# Patient Record
Sex: Female | Born: 1935 | Race: White | Hispanic: No | Smoking: Former smoker
Health system: Southern US, Community
[De-identification: ages and names within clinical notes are randomized; demographics above are authoritative.]

## PROBLEM LIST (undated history)

## (undated) DIAGNOSIS — R079 Chest pain, unspecified: Secondary | ICD-10-CM

## (undated) DIAGNOSIS — I739 Peripheral vascular disease, unspecified: Secondary | ICD-10-CM

## (undated) DIAGNOSIS — I209 Angina pectoris, unspecified: Secondary | ICD-10-CM

## (undated) DIAGNOSIS — E7800 Pure hypercholesterolemia, unspecified: Secondary | ICD-10-CM

## (undated) DIAGNOSIS — E039 Hypothyroidism, unspecified: Secondary | ICD-10-CM

## (undated) DIAGNOSIS — F32A Depression, unspecified: Secondary | ICD-10-CM

## (undated) DIAGNOSIS — F419 Anxiety disorder, unspecified: Secondary | ICD-10-CM

## (undated) DIAGNOSIS — I4891 Unspecified atrial fibrillation: Secondary | ICD-10-CM

## (undated) DIAGNOSIS — I639 Cerebral infarction, unspecified: Secondary | ICD-10-CM

## (undated) DIAGNOSIS — I509 Heart failure, unspecified: Secondary | ICD-10-CM

## (undated) DIAGNOSIS — R413 Other amnesia: Secondary | ICD-10-CM

## (undated) DIAGNOSIS — E119 Type 2 diabetes mellitus without complications: Secondary | ICD-10-CM

## (undated) DIAGNOSIS — E079 Disorder of thyroid, unspecified: Secondary | ICD-10-CM

## (undated) DIAGNOSIS — T8859XA Other complications of anesthesia, initial encounter: Secondary | ICD-10-CM

## (undated) DIAGNOSIS — F329 Major depressive disorder, single episode, unspecified: Secondary | ICD-10-CM

## (undated) DIAGNOSIS — T4145XA Adverse effect of unspecified anesthetic, initial encounter: Secondary | ICD-10-CM

## (undated) DIAGNOSIS — I10 Essential (primary) hypertension: Secondary | ICD-10-CM

## (undated) DIAGNOSIS — R0600 Dyspnea, unspecified: Secondary | ICD-10-CM

## (undated) DIAGNOSIS — I2510 Atherosclerotic heart disease of native coronary artery without angina pectoris: Secondary | ICD-10-CM

## (undated) DIAGNOSIS — I219 Acute myocardial infarction, unspecified: Secondary | ICD-10-CM

## (undated) DIAGNOSIS — F0150 Vascular dementia without behavioral disturbance: Secondary | ICD-10-CM

## (undated) DIAGNOSIS — K219 Gastro-esophageal reflux disease without esophagitis: Secondary | ICD-10-CM

## (undated) DIAGNOSIS — J42 Unspecified chronic bronchitis: Secondary | ICD-10-CM

## (undated) DIAGNOSIS — I672 Cerebral atherosclerosis: Secondary | ICD-10-CM

## (undated) DIAGNOSIS — M1990 Unspecified osteoarthritis, unspecified site: Secondary | ICD-10-CM

## (undated) HISTORY — DX: Chest pain, unspecified: R07.9

## (undated) HISTORY — PX: ABDOMINAL HYSTERECTOMY: SHX81

## (undated) HISTORY — DX: Heart failure, unspecified: I50.9

## (undated) HISTORY — DX: Unspecified atrial fibrillation: I48.91

## (undated) HISTORY — PX: CORONARY ANGIOPLASTY: SHX604

## (undated) HISTORY — DX: Atherosclerotic heart disease of native coronary artery without angina pectoris: I25.10

## (undated) HISTORY — PX: JOINT REPLACEMENT: SHX530

## (undated) HISTORY — PX: CATARACT EXTRACTION W/ INTRAOCULAR LENS  IMPLANT, BILATERAL: SHX1307

## (undated) HISTORY — DX: Peripheral vascular disease, unspecified: I73.9

## (undated) HISTORY — DX: Acute myocardial infarction, unspecified: I21.9

## (undated) HISTORY — PX: EYE SURGERY: SHX253

## (undated) HISTORY — DX: Dyspnea, unspecified: R06.00

## (undated) HISTORY — DX: Cerebral atherosclerosis: I67.2

---

## 1991-05-15 HISTORY — PX: CORONARY ARTERY BYPASS GRAFT: SHX141

## 1998-04-06 ENCOUNTER — Ambulatory Visit (HOSPITAL_COMMUNITY): Admission: RE | Admit: 1998-04-06 | Discharge: 1998-04-06 | Payer: Self-pay | Admitting: Cardiology

## 1998-12-01 ENCOUNTER — Encounter: Payer: Self-pay | Admitting: Vascular Surgery

## 1998-12-02 ENCOUNTER — Inpatient Hospital Stay: Admission: RE | Admit: 1998-12-02 | Discharge: 1998-12-06 | Payer: Self-pay | Admitting: Vascular Surgery

## 1998-12-04 ENCOUNTER — Encounter: Payer: Self-pay | Admitting: *Deleted

## 1999-08-22 ENCOUNTER — Other Ambulatory Visit: Admission: RE | Admit: 1999-08-22 | Discharge: 1999-08-22 | Payer: Self-pay | Admitting: Endocrinology

## 2000-08-28 ENCOUNTER — Other Ambulatory Visit: Admission: RE | Admit: 2000-08-28 | Discharge: 2000-08-28 | Payer: Self-pay | Admitting: Endocrinology

## 2000-12-19 ENCOUNTER — Ambulatory Visit (HOSPITAL_COMMUNITY): Admission: RE | Admit: 2000-12-19 | Discharge: 2000-12-19 | Payer: Self-pay | Admitting: Vascular Surgery

## 2000-12-25 ENCOUNTER — Inpatient Hospital Stay (HOSPITAL_COMMUNITY): Admission: RE | Admit: 2000-12-25 | Discharge: 2000-12-29 | Payer: Self-pay | Admitting: Vascular Surgery

## 2000-12-25 ENCOUNTER — Encounter: Payer: Self-pay | Admitting: Vascular Surgery

## 2002-02-20 ENCOUNTER — Encounter: Payer: Self-pay | Admitting: Emergency Medicine

## 2002-02-20 ENCOUNTER — Inpatient Hospital Stay (HOSPITAL_COMMUNITY): Admission: EM | Admit: 2002-02-20 | Discharge: 2002-02-23 | Payer: Self-pay | Admitting: Emergency Medicine

## 2002-02-21 ENCOUNTER — Encounter: Payer: Self-pay | Admitting: Cardiology

## 2003-10-20 ENCOUNTER — Other Ambulatory Visit: Admission: RE | Admit: 2003-10-20 | Discharge: 2003-10-20 | Payer: Self-pay | Admitting: Endocrinology

## 2004-02-02 ENCOUNTER — Ambulatory Visit (HOSPITAL_COMMUNITY): Admission: RE | Admit: 2004-02-02 | Discharge: 2004-02-02 | Payer: Self-pay | Admitting: Endocrinology

## 2006-05-16 ENCOUNTER — Ambulatory Visit (HOSPITAL_COMMUNITY): Admission: RE | Admit: 2006-05-16 | Discharge: 2006-05-16 | Payer: Self-pay | Admitting: Vascular Surgery

## 2007-02-21 ENCOUNTER — Encounter: Admission: RE | Admit: 2007-02-21 | Discharge: 2007-02-21 | Payer: Self-pay | Admitting: Endocrinology

## 2008-03-01 DIAGNOSIS — I2510 Atherosclerotic heart disease of native coronary artery without angina pectoris: Secondary | ICD-10-CM

## 2008-03-01 HISTORY — DX: Atherosclerotic heart disease of native coronary artery without angina pectoris: I25.10

## 2008-03-09 ENCOUNTER — Ambulatory Visit: Payer: Self-pay | Admitting: Vascular Surgery

## 2008-05-14 HISTORY — PX: TOTAL KNEE ARTHROPLASTY: SHX125

## 2009-05-14 HISTORY — PX: AORTA - BILATERAL FEMORAL ARTERY BYPASS GRAFT: SHX1175

## 2009-10-05 ENCOUNTER — Inpatient Hospital Stay (HOSPITAL_COMMUNITY): Admission: EM | Admit: 2009-10-05 | Discharge: 2009-10-07 | Payer: Self-pay | Admitting: Emergency Medicine

## 2009-10-06 HISTORY — PX: CARDIAC CATHETERIZATION: SHX172

## 2009-10-07 ENCOUNTER — Encounter (INDEPENDENT_AMBULATORY_CARE_PROVIDER_SITE_OTHER): Payer: Self-pay | Admitting: Cardiology

## 2009-10-07 DIAGNOSIS — R079 Chest pain, unspecified: Secondary | ICD-10-CM

## 2009-10-07 HISTORY — DX: Chest pain, unspecified: R07.9

## 2009-12-27 ENCOUNTER — Ambulatory Visit: Payer: Self-pay | Admitting: Vascular Surgery

## 2010-01-11 ENCOUNTER — Ambulatory Visit: Payer: Self-pay | Admitting: Surgery

## 2010-01-11 ENCOUNTER — Ambulatory Visit (HOSPITAL_COMMUNITY): Admission: RE | Admit: 2010-01-11 | Discharge: 2010-01-11 | Payer: Self-pay | Admitting: Surgery

## 2010-02-07 ENCOUNTER — Ambulatory Visit: Payer: Self-pay | Admitting: Vascular Surgery

## 2010-02-23 ENCOUNTER — Inpatient Hospital Stay (HOSPITAL_COMMUNITY): Admission: RE | Admit: 2010-02-23 | Discharge: 2010-03-07 | Payer: Self-pay | Admitting: Vascular Surgery

## 2010-02-23 ENCOUNTER — Ambulatory Visit: Payer: Self-pay | Admitting: Vascular Surgery

## 2010-02-23 HISTORY — PX: ANGIOPLASTY: SHX39

## 2010-02-27 ENCOUNTER — Encounter: Payer: Self-pay | Admitting: Vascular Surgery

## 2010-03-14 ENCOUNTER — Ambulatory Visit: Payer: Self-pay | Admitting: Vascular Surgery

## 2010-05-14 HISTORY — PX: CARDIAC CATHETERIZATION: SHX172

## 2010-06-23 ENCOUNTER — Other Ambulatory Visit: Payer: Self-pay

## 2010-07-03 ENCOUNTER — Other Ambulatory Visit: Payer: Self-pay

## 2010-07-26 LAB — GLUCOSE, CAPILLARY
Glucose-Capillary: 102 mg/dL — ABNORMAL HIGH (ref 70–99)
Glucose-Capillary: 104 mg/dL — ABNORMAL HIGH (ref 70–99)
Glucose-Capillary: 106 mg/dL — ABNORMAL HIGH (ref 70–99)
Glucose-Capillary: 107 mg/dL — ABNORMAL HIGH (ref 70–99)
Glucose-Capillary: 107 mg/dL — ABNORMAL HIGH (ref 70–99)
Glucose-Capillary: 122 mg/dL — ABNORMAL HIGH (ref 70–99)
Glucose-Capillary: 123 mg/dL — ABNORMAL HIGH (ref 70–99)
Glucose-Capillary: 127 mg/dL — ABNORMAL HIGH (ref 70–99)
Glucose-Capillary: 129 mg/dL — ABNORMAL HIGH (ref 70–99)
Glucose-Capillary: 139 mg/dL — ABNORMAL HIGH (ref 70–99)
Glucose-Capillary: 152 mg/dL — ABNORMAL HIGH (ref 70–99)
Glucose-Capillary: 158 mg/dL — ABNORMAL HIGH (ref 70–99)
Glucose-Capillary: 77 mg/dL (ref 70–99)
Glucose-Capillary: 81 mg/dL (ref 70–99)
Glucose-Capillary: 85 mg/dL (ref 70–99)
Glucose-Capillary: 85 mg/dL (ref 70–99)
Glucose-Capillary: 89 mg/dL (ref 70–99)
Glucose-Capillary: 94 mg/dL (ref 70–99)
Glucose-Capillary: 96 mg/dL (ref 70–99)
Glucose-Capillary: 114 mg/dL — ABNORMAL HIGH (ref 70–99)
Glucose-Capillary: 129 mg/dL — ABNORMAL HIGH (ref 70–99)
Glucose-Capillary: 131 mg/dL — ABNORMAL HIGH (ref 70–99)
Glucose-Capillary: 134 mg/dL — ABNORMAL HIGH (ref 70–99)
Glucose-Capillary: 136 mg/dL — ABNORMAL HIGH (ref 70–99)
Glucose-Capillary: 87 mg/dL (ref 70–99)

## 2010-07-26 LAB — CBC
HCT: 28.6 % — ABNORMAL LOW (ref 36.0–46.0)
HCT: 30.2 % — ABNORMAL LOW (ref 36.0–46.0)
Hemoglobin: 10.4 g/dL — ABNORMAL LOW (ref 12.0–15.0)
Hemoglobin: 8.6 g/dL — ABNORMAL LOW (ref 12.0–15.0)
Hemoglobin: 9.5 g/dL — ABNORMAL LOW (ref 12.0–15.0)
Hemoglobin: 10 g/dL — ABNORMAL LOW (ref 12.0–15.0)
MCH: 28.6 pg (ref 26.0–34.0)
MCH: 29.1 pg (ref 26.0–34.0)
MCH: 29.1 pg (ref 26.0–34.0)
MCH: 28.8 pg (ref 26.0–34.0)
MCHC: 33.1 g/dL (ref 30.0–36.0)
MCHC: 33.2 g/dL (ref 30.0–36.0)
MCHC: 34.8 g/dL (ref 30.0–36.0)
MCV: 86 fL (ref 78.0–100.0)
MCV: 87.5 fL (ref 78.0–100.0)
MCV: 89.4 fL (ref 78.0–100.0)
MCV: 82.8 fL (ref 78.0–100.0)
Platelets: 232 10*3/uL (ref 150–400)
Platelets: 270 10*3/uL (ref 150–400)
Platelets: 203 10*3/uL (ref 150–400)
RBC: 3.01 MIL/uL — ABNORMAL LOW (ref 3.87–5.11)
RBC: 3.27 MIL/uL — ABNORMAL LOW (ref 3.87–5.11)
RBC: 3.47 MIL/uL — ABNORMAL LOW (ref 3.87–5.11)
RBC: 3.67 MIL/uL — ABNORMAL LOW (ref 3.87–5.11)
RBC: 3.46 MIL/uL — ABNORMAL LOW (ref 3.87–5.11)
RBC: 4.58 MIL/uL (ref 3.87–5.11)
RDW: 13.6 % (ref 11.5–15.5)
RDW: 14.2 % (ref 11.5–15.5)
RDW: 13 % (ref 11.5–15.5)
WBC: 16.5 10*3/uL — ABNORMAL HIGH (ref 4.0–10.5)
WBC: 11.5 10*3/uL — ABNORMAL HIGH (ref 4.0–10.5)

## 2010-07-26 LAB — BASIC METABOLIC PANEL
BUN: 14 mg/dL (ref 6–23)
BUN: 5 mg/dL — ABNORMAL LOW (ref 6–23)
CO2: 24 mEq/L (ref 19–32)
CO2: 26 mEq/L (ref 19–32)
Chloride: 108 mEq/L (ref 96–112)
Chloride: 94 mEq/L — ABNORMAL LOW (ref 96–112)
Creatinine, Ser: 0.7 mg/dL (ref 0.4–1.2)
GFR calc Af Amer: 50 mL/min — ABNORMAL LOW (ref >60)
GFR calc Af Amer: >60 mL/min (ref >60)
GFR calc Af Amer: >60 mL/min (ref >60)
GFR calc non Af Amer: >60 mL/min (ref >60)
Glucose, Bld: 64 mg/dL — ABNORMAL LOW (ref 70–99)
Potassium: 4 mEq/L (ref 3.5–5.1)
Sodium: 127 mEq/L — ABNORMAL LOW (ref 135–145)
Sodium: 135 mEq/L (ref 135–145)

## 2010-07-26 LAB — BLOOD GAS, ARTERIAL
Drawn by: 181601
FIO2: 0.21 %
O2 Saturation: 98.9 %
Patient temperature: 98.6
pO2, Arterial: 117 mmHg — ABNORMAL HIGH (ref 80.0–100.0)

## 2010-07-26 LAB — URINALYSIS, ROUTINE W REFLEX MICROSCOPIC
Glucose, UA: NEGATIVE mg/dL
Nitrite: NEGATIVE
Protein, ur: NEGATIVE mg/dL

## 2010-07-26 LAB — MAGNESIUM: Magnesium: 2.1 mg/dL (ref 1.5–2.5)

## 2010-07-26 LAB — COMPREHENSIVE METABOLIC PANEL
ALT: 17 U/L (ref 0–35)
AST: 21 U/L (ref 0–37)
BUN: 15 mg/dL (ref 6–23)
CO2: 25 mEq/L (ref 19–32)
CO2: 21 mEq/L (ref 19–32)
Calcium: 8.6 mg/dL (ref 8.4–10.5)
Chloride: 102 mEq/L (ref 96–112)
Creatinine, Ser: 0.86 mg/dL (ref 0.4–1.2)
Creatinine, Ser: 0.65 mg/dL (ref 0.4–1.2)
GFR calc Af Amer: >60 mL/min (ref >60)
GFR calc non Af Amer: >60 mL/min (ref >60)
GFR calc non Af Amer: >60 mL/min (ref >60)
Glucose, Bld: 83 mg/dL (ref 70–99)
Glucose, Bld: 131 mg/dL — ABNORMAL HIGH (ref 70–99)
Total Bilirubin: 0.7 mg/dL (ref 0.3–1.2)

## 2010-07-26 LAB — PROTIME-INR
INR: 1.13 (ref 0.00–1.49)
INR: 1.86 — ABNORMAL HIGH (ref 0.00–1.49)
Prothrombin Time: 14.7 seconds (ref 11.6–15.2)
Prothrombin Time: 14.7 seconds (ref 11.6–15.2)
Prothrombin Time: 15 seconds (ref 11.6–15.2)
Prothrombin Time: 21.6 seconds — ABNORMAL HIGH (ref 11.6–15.2)
Prothrombin Time: 13 seconds (ref 11.6–15.2)

## 2010-07-26 LAB — SURGICAL PCR SCREEN
MRSA, PCR: NEGATIVE
Staphylococcus aureus: NEGATIVE

## 2010-07-26 LAB — APTT
aPTT: 36 seconds (ref 24–37)
aPTT: 30 seconds (ref 24–37)

## 2010-07-26 LAB — CROSSMATCH
ABO/RH(D): O NEG
Unit division: 0

## 2010-07-26 LAB — CK TOTAL AND CKMB (NOT AT ARMC)
CK, MB: 2 ng/mL (ref 0.3–4.0)
Relative Index: INVALID (ref 0.0–2.5)

## 2010-07-26 LAB — TROPONIN I: Troponin I: 0.01 ng/mL (ref 0.00–0.06)

## 2010-07-26 LAB — ABO/RH: ABO/RH(D): O NEG

## 2010-07-26 LAB — HEPARIN LEVEL (UNFRACTIONATED)
Heparin Unfractionated: 0.19 IU/mL — ABNORMAL LOW (ref 0.30–0.70)
Heparin Unfractionated: <0.1 IU/mL — ABNORMAL LOW (ref 0.30–0.70)

## 2010-07-27 LAB — POCT I-STAT, CHEM 8
Chloride: 104 mEq/L (ref 96–112)
Creatinine, Ser: 0.7 mg/dL (ref 0.4–1.2)
Glucose, Bld: 125 mg/dL — ABNORMAL HIGH (ref 70–99)
HCT: 43 % (ref 36.0–46.0)
Potassium: 3.6 mEq/L (ref 3.5–5.1)
Sodium: 140 mEq/L (ref 135–145)

## 2010-07-31 LAB — POCT CARDIAC MARKERS
CKMB, poc: 1.1 ng/mL (ref 1.0–8.0)
CKMB, poc: 1.7 ng/mL (ref 1.0–8.0)
Myoglobin, poc: 82.6 ng/mL (ref 12–200)
Troponin i, poc: <0.05 ng/mL (ref 0.00–0.09)
Troponin i, poc: <0.05 ng/mL (ref 0.00–0.09)

## 2010-07-31 LAB — LIPID PANEL
Cholesterol: 147 mg/dL (ref 0–200)
HDL: 34 mg/dL — ABNORMAL LOW (ref >39)
LDL Cholesterol: 75 mg/dL (ref 0–99)
Triglycerides: 189 mg/dL — ABNORMAL HIGH (ref <150)

## 2010-07-31 LAB — DIFFERENTIAL
Basophils Absolute: 0 10*3/uL (ref 0.0–0.1)
Eosinophils Relative: 2 % (ref 0–5)
Lymphocytes Relative: 23 % (ref 12–46)
Lymphs Abs: 2.1 10*3/uL (ref 0.7–4.0)
Monocytes Absolute: 0.7 10*3/uL (ref 0.1–1.0)
Monocytes Relative: 7 % (ref 3–12)
Neutro Abs: 6.2 10*3/uL (ref 1.7–7.7)

## 2010-07-31 LAB — URINALYSIS, ROUTINE W REFLEX MICROSCOPIC
Bilirubin Urine: NEGATIVE
Hgb urine dipstick: NEGATIVE
Ketones, ur: 15 mg/dL — AB
Nitrite: NEGATIVE
Protein, ur: NEGATIVE mg/dL
Specific Gravity, Urine: 1.008 (ref 1.005–1.030)
Urobilinogen, UA: 0.2 mg/dL (ref 0.0–1.0)

## 2010-07-31 LAB — BASIC METABOLIC PANEL
BUN: 9 mg/dL (ref 6–23)
CO2: 21 mEq/L (ref 19–32)
CO2: 24 mEq/L (ref 19–32)
CO2: 27 mEq/L (ref 19–32)
Calcium: 8.9 mg/dL (ref 8.4–10.5)
Calcium: 9 mg/dL (ref 8.4–10.5)
Chloride: 100 mEq/L (ref 96–112)
Creatinine, Ser: 0.68 mg/dL (ref 0.4–1.2)
GFR calc Af Amer: >60 mL/min (ref >60)
GFR calc Af Amer: >60 mL/min (ref >60)
GFR calc non Af Amer: >60 mL/min (ref >60)
Glucose, Bld: 101 mg/dL — ABNORMAL HIGH (ref 70–99)
Glucose, Bld: 122 mg/dL — ABNORMAL HIGH (ref 70–99)
Glucose, Bld: 127 mg/dL — ABNORMAL HIGH (ref 70–99)
Potassium: 2.9 mEq/L — ABNORMAL LOW (ref 3.5–5.1)
Sodium: 128 mEq/L — ABNORMAL LOW (ref 135–145)
Sodium: 133 mEq/L — ABNORMAL LOW (ref 135–145)
Sodium: 138 mEq/L (ref 135–145)

## 2010-07-31 LAB — CK TOTAL AND CKMB (NOT AT ARMC): CK, MB: 2 ng/mL (ref 0.3–4.0)

## 2010-07-31 LAB — GLUCOSE, CAPILLARY
Glucose-Capillary: 98 mg/dL (ref 70–99)
Glucose-Capillary: 98 mg/dL (ref 70–99)

## 2010-07-31 LAB — CARDIAC PANEL(CRET KIN+CKTOT+MB+TROPI)
CK, MB: 1.8 ng/mL (ref 0.3–4.0)
CK, MB: 1.9 ng/mL (ref 0.3–4.0)
Total CK: 124 U/L (ref 7–177)
Total CK: 69 U/L (ref 7–177)

## 2010-07-31 LAB — CBC
HCT: 42.5 % (ref 36.0–46.0)
Hemoglobin: 13.6 g/dL (ref 12.0–15.0)
Hemoglobin: 13.7 g/dL (ref 12.0–15.0)
Hemoglobin: 14.7 g/dL (ref 12.0–15.0)
MCHC: 34.6 g/dL (ref 30.0–36.0)
RBC: 4.43 MIL/uL (ref 3.87–5.11)
RBC: 4.78 MIL/uL (ref 3.87–5.11)
RDW: 12.7 % (ref 11.5–15.5)
RDW: 13.4 % (ref 11.5–15.5)
WBC: 9.1 10*3/uL (ref 4.0–10.5)

## 2010-07-31 LAB — TSH: TSH: 1.061 u[IU]/mL (ref 0.350–4.500)

## 2010-07-31 LAB — HEPATIC FUNCTION PANEL
ALT: 21 U/L (ref 0–35)
Albumin: 4.4 g/dL (ref 3.5–5.2)
Alkaline Phosphatase: 100 U/L (ref 39–117)
Indirect Bilirubin: 0.4 mg/dL (ref 0.3–0.9)
Total Protein: 7.4 g/dL (ref 6.0–8.3)

## 2010-07-31 LAB — PROTIME-INR
INR: 0.95 (ref 0.00–1.49)
Prothrombin Time: 12.6 seconds (ref 11.6–15.2)

## 2010-07-31 LAB — HEPARIN LEVEL (UNFRACTIONATED)
Heparin Unfractionated: 0.15 IU/mL — ABNORMAL LOW (ref 0.30–0.70)
Heparin Unfractionated: 0.82 IU/mL — ABNORMAL HIGH (ref 0.30–0.70)

## 2010-07-31 LAB — URINE MICROSCOPIC-ADD ON

## 2010-07-31 LAB — MAGNESIUM: Magnesium: 2 mg/dL (ref 1.5–2.5)

## 2010-09-01 DIAGNOSIS — I739 Peripheral vascular disease, unspecified: Secondary | ICD-10-CM

## 2010-09-01 HISTORY — DX: Peripheral vascular disease, unspecified: I73.9

## 2010-09-26 NOTE — Assessment & Plan Note (Signed)
OFFICE VISIT   Short, [name]  DOB:  Feb 25, 1928                                       03/09/2008  DZHGD#:[id number]   The patient is to undergo bilateral knee replacement by Dr. Kenna Gilbert in [facility] and was referred today for clearance for this  surgery from a vascular standpoint.  She continues to have bilateral  lower extremity symptoms, which she has had for the last several years.  She states the right hip and thigh are uncomfortable with walking and  becomes worse as ambulation proceeds.  She also has some calf discomfort  bilaterally.  She has some discomfort along the pretibial area of the  right leg, even at rest at times.  She has had no nonhealing ulcers or  infection.  She does take 1 aspirin per day.  She has previously  undergone aortobifemoral bypass grafting and actually occluded the right  limb of the graft and later underwent a left to right femoral-femoral  bypass. This was examined in 2008 by angiography and was widely patent  at that time.   EXAM:  She continues to have excellent femoral pulses bilaterally with  well perfused lower extremities.  The popliteal pulses are not as  readily palpable.  The right is 1 to 2+, the left is absent.   ABIs today are 68% on the right and 59% on the left.  I do not think she  would be at high risk for developing vascular problems during the knee  replacement, since her vasculature has been replaced in the aortoiliac  segments not in the infrapopliteal areas.  If she has worsening of her  symptoms following the knee replacement, she will be back in touch with  [place].   [name] Short, M.D.  Electronically Signed   JDL/MEDQ  D:  03/09/2008  T:  03/10/2008  Job:  [id number]

## 2010-09-26 NOTE — Procedures (Signed)
LOWER EXTREMITY ARTERIAL EVALUATION-SINGLE LEVEL   INDICATION:  Preop knee surgery, bilateral claudication.   HISTORY:  Diabetes:  Yes.  Cardiac:  No.  Hypertension:  No.  Smoking:  Former smoker.  Previous Surgery:  Left-to-right fem-fem bypass graft.  Aortobifemoral  bypass graft (right limb occluded).   RESTING SYSTOLIC PRESSURES: (ABI)                          RIGHT                LEFT  Brachial:               160                  158  Anterior tibial:        94                   84  Posterior tibial:       108 (0.68)           94 (0.59)  Peroneal:  DOPPLER WAVEFORM ANALYSIS:  Anterior tibial:        Monophasic           Monophasic  Posterior tibial:       Monophasic           Monophasic  Peroneal:   PREVIOUS ABI'S:  Date: 02/08/2006  RIGHT:  0.77  LEFT:  0.76   IMPRESSION:  Ankle brachial indices are slightly lower than previously  recorded bilaterally and suggest moderate occlusive disease bilaterally.   ___________________________________________  [name] [name], M.D.   MC/MEDQ  D:  03/09/2008  T:  03/09/2008  Job:  [id number]

## 2010-09-26 NOTE — Assessment & Plan Note (Signed)
OFFICE VISIT   Spinello, Isobella  DOB:  1927-10-11                                       03/14/2010  QVZDG#:[id number]   Patient had a surgical procedure performed on October 13 which consisted  of Dacron patch angioplasty of the distal aspect of her left limb of an  aortobifemoral bypass graft and revision of the left-right femoral-  femoral bypass.  Her ABIs improved from 50% to 60% up to 87% on the  right and 95% on the left.  She has biphasic flow in both feet today,  and her claudication symptoms have been relieved.  She does have  arrhythmias which are being managed by Dr. Nanetta Batty and is on  chronic Coumadin now for these arrhythmias (atrial fib).  She is to see  Dr. Allyson Sabal tomorrow and has noted some palpitations.   On exam today, blood pressure is 148/78, heart rate is 58, respirations  14.  Left inguinal incision is healed nicely, and she has an excellent  femoral pulse bilaterally.   I am pleased with her results, and we will follow her on a regular basis  in the vascular lab for patency of these bypass grafts in the  aortobifemorals inserted by me in 1994.     [name] [name], M.D.  Electronically Signed   JDL/MEDQ  D:  03/14/2010  T:  03/15/2010  Job:  [id number]

## 2010-09-26 NOTE — Assessment & Plan Note (Signed)
OFFICE VISIT   Short, [name]  DOB:  [birth year]                                       12/27/2009  ZOXWR#:[id number]   [name] Short was referred today by Dr. Allyson Sabal for consideration for further  revascularization of her lower extremities.  This patient has a Tassinari  complicated history beginning in 1994 when she had an aortobifemoral  bypass graft performed by me.  She eventually occluded the right limb  and required a revision in the groin on the left of the left-right  femoral-femoral bypass.  She required a second revision in 2002 with  replacement of the distal left limb aortobifemoral graft.  She continued  to have bilateral hip claudication symptoms which worsened in 2008 and I  performed an angiogram at that time which I have reviewed today.  She  had a widely patent aorto left femoral graft with some mild concentric  narrowing at the proximal aortic anastomosis of the small native aorta.  The left leg femoral-femoral graft was widely patent as were both  superficial femoral arteries and there was nothing to be revised at that  time.  She now returns stating that her symptoms have become so severe  in her hips and thighs and eventually her calves that she is unable to  walk any distance at all.  ABIs performed in Dr. Hazle Coca office were  0.67 on the right and 0.64 on the left which are similar to the last  office visit in 2009 when I last saw [name] Short.  She does have a very  high velocity in the left groin area over the left-to-right femoral-  femoral anastomosis of 635 cm per second.  The patient states that she  is limited so severely that she can hardly walk a half block but she  does not have rest pain or history of nonhealing ulcers.  She recently  did have a cardiac workup by Dr. Allyson Sabal which revealed occlusion of her  vein grafts to her coronary arteries performed in 1993 by Dr. Andrey Campanile  with multiple collaterals.   Chronic medical problems:  1.  Diabetes.  2. Hypertension.  3. Hyperlipidemia.  4. Coronary artery disease.  5. COPD.   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   REVIEW OF SYSTEMS:  Does have occasional chest discomfort, dyspnea on  exertion, orthopnea, arthritis joint pain, weight gain, reflux esophagus  and constipation.  All other systems are negative.   PHYSICAL EXAMINATION:  Blood pressure 163/78, heart rate 77,  respirations 16.  GENERAL:  She is an obese female who is in no apparent distress, alert  and oriented x3.  HEENT:  Exam normal for age.  LUNGS:  Clear to auscultation.  ABDOMEN:  Soft and nontender with no masses.  Lower extremity exam reveals femoral pulses which are difficult to  palpate because of the scar tissue.  They are 1 to 2+ at least.  Both  feet are adequately perfused but no palpable pulses are noted.   I discussed the options with her including doing nothing but she would  like to have this evaluated to see if any revision is feasible.  She  does have a progressive stenosis of her proximal aortic anastomosis and  she could have a severe  stenosis at the distal left limb of her  aortobifemoral graft which could be revised through an inguinal  approach.  I have scheduled her for an angiogram to be done on Thursday,  the 30th of August by Dr. Myra Gianotti to get good views of her aortic and  femoral anastomoses as well as her runoff to see what options are  available.     [name] [name], M.D.  Electronically Signed   JDL/MEDQ  D:  12/27/2009  T:  12/28/2009  Job:  [id number]   cc:   [name], M.D.

## 2010-09-26 NOTE — Assessment & Plan Note (Signed)
OFFICE VISIT   Short, [name]  DOB:  1928/02/16                                       02/07/2010  EAVWU#:[id number]   Patient was evaluated by me on August 16 for severe claudication of both  lower extremities with evidence of severe stenosis in the distal end of  the left limb with aortobifemoral graft, which was placed by me in 1994.  He has a functioning left-right femoral-femoral bypass.  Dr. Myra Short  performed an angiogram which reveals a concentric 80% to 85% stenosis in  the distal 2-3 cm of the left limb of the aortobifemoral graft just  proximal to the origin of the femoral-femoral graft and the common  femoral artery.  There is some irregularity in the left-to-right femoral-  femoral graft, otherwise it is widely patent.  Both superficial femoral  arteries are patent as well as the tibial vessels.   She was having severe claudication, unable to walk more than a half  block.  Also is having other symptoms such as back pain and hip  discomfort.  I have discussed with her the fact that this will not be  improved by this procedure to improve the flow down the left limb of the  aortic graft, which is feeding both legs.   CHRONIC MEDICAL PROBLEMS:  1. Diabetes.  2. Hypertension.  3. Hyperlipidemia.  4. Coronary artery disease.  5. COPD.  She did have a recent cardiac cath by Dr. Allyson Short which      revealed occlusion of her vein graft with multiple collaterals.      Coronary artery bypass graft having been done in 1993.  She denies      any chest pain or dyspnea on exertion at this time.   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  Smoking 16 years.   PHYSICAL EXAMINATION:  Blood pressure 159/79, heart rate 78,  respirations 22.  Generally, she is an obese female who is no apparent  distress, alert and oriented x3.  HEENT:  Normal for age.  Abdomen:  Soft, nontender with no masses.  Lower extremity exam reveals 2+  femoral  pulses bilaterally with well-perfused lower extremities.   ABIs in both legs are in the 50%  to 60%.  I think she should have  revision of the left limb of aortobifemoral graft through a left  inguinal approach, and I have discussed this with her and the fact that  it would not relieve her back pain but will hopefully help with  claudication symptoms in both thighs and calves.  I have scheduled that  Thursday, October 13, at [facility].  Hopefully this will relieve her  symptoms.     [name] Short, M.D.  Electronically Signed   JDL/MEDQ  D:  02/07/2010  T:  02/08/2010  Job:  [id number]

## 2010-09-29 NOTE — Discharge Summary (Signed)
[name] Short, [name] Short                          ACCOUNT NO.:  [id number]   MEDICAL RECORD NO.:  [id number]                   PATIENT TYPE:  INP   LOCATION:  3743                                 FACILITY:  [facility]   PHYSICIAN:  Jaclyn Prime. Lucas Mallow, M.D.                DATE OF BIRTH:  05-09-1928   DATE OF ADMISSION:  02/20/2002  DATE OF DISCHARGE:  02/23/2002                                 DISCHARGE SUMMARY   CHIEF COMPLAINT:  Chest pain.   HISTORY OF PRESENT ILLNESS:  This 77 year old woman came to the emergency  room, stating that she developed left anterior squeezing chest pain the day  prior to admission with associated dyspnea, but without clearcut change in  the pain with exertion.  She had not felt in about two weeks.  She is 10  years post coronary artery bypass grafting, at which time she stopped  smoking.  She has had three bypasses to her legs, most recently in August in  2002.  Comprehensive details of the past medical, family, and social  history, as well as a comprehensive review of systems, are to be found in  the history and physical.   PHYSICAL EXAMINATION:  GENERAL APPEARANCE:  She was a well-developed, well-  nourished woman who looked her stated age of 77.  The general physical  examination was unremarkable.  VITAL SIGNS:  Blood pressure 160/80, pulse 60 and regular, respirations 20  and unlabored.  HEART:  The cardiac apical impulse was cryptic.  The heart rhythm was  regular and the rate was normal.  There was no gallop, click, or murmur.  EXTREMITIES:  She had slightly decreased left pedal pulses and normal right  pedal pulses.  There was no femoral artery bruit.   LABORATORY DATA:  Initial laboratory data showed white cells 9100,  hemoglobin 15.4, mean cell volume 86, and platelets 220,000.  Creatinine  0.8.  Glucose 132.  The remainder of the comprehensive metabolic panel was  normal.  Peak CK 75 with 1.4 MB band.  High sensitivity CRP 2.9.  Troponin  less than  0.02 on two occasions.   HOSPITAL COURSE:  Given her history of bypass grafting 10 years ago and the  acute onset of chest discomfort, the possibility of cardiac catheterization  was entertained, but on discussion it was felt that noninvasive testing was  the appropriate first modality.  Therefore, a Persantine Cardiolite test was  carried out on February 21, 2002.  This test was normal with no evidence for  infarction or ischemia.  The patient was successfully ambulated thereafter  and had no chest discomfort.  Because of the normal test and her clinical  improvement, it was not felt urgent to proceed with cardiac catheterization.  In as much as she was also anxious to go home, she was discharged in  improved condition.   FINAL DIAGNOSES:  1. Chest pain, myocardial infarction  ruled out.  2. Coronary artery disease, status post coronary artery bypass grafting in     1993.  3. Severe peripheral vascular disease.  4. Noninsulin-dependent diabetes mellitus.  5. Hypothyroidism.  6. Dyslipidemia.   CONDITION ON DISCHARGE:  Improved and stable.   DIET:  Cholesterol-lowering, American Diabetes Associated diet.   DISCHARGE MEDICATIONS:  1. Nitrostat 0.4 mg p.r.n.  2. Diazepam 10 mg daily.  3. Synthroid 0.88 mg daily.  4. Nitro-Dur patch 0.3 mg one daily.  5. Lipitor 10 mg daily.  6. Actonel 35 mg weekly.  7. Amaryl 2 mg daily.  8. Propranolol 40 mg twice a day.  9. Prevacid 30 mg daily.  10.      Norvasc 10 mg daily.  11.      Aspirin one daily.  12.      Centrum vitamins.  13.      Calcium supplements.  14.      The patient was given a prescription for Skelaxin since there was     felt to be some element of muscle spasm in her back involved in her pain.   PAIN MANAGEMENT:  Not applicable.   ACTIVITY:  Walking as comfortable.   WOUND CARE:  Not applicable.   SPECIAL INSTRUCTIONS:  Not application.   RETURN TO WORK:  Not applicable.   FOLLOW-UP:  Follow up with [name],  M.D., on March 05, 2002, as  previously scheduled.    OPERATIONS:  None.   COMPLICATIONS:  None.                                               Jaclyn Prime. Lucas Mallow, M.D.    DDG/MEDQ  D:  03/11/2002  T:  03/12/2002  Job:  [id number]   cc:   Aram Candela. [name], M.D.  [address]  [place]  [place] [address]  Fax: [phone]   [name], M.D.  [address]  [place]  [place] [address]  Fax: [phone]

## 2010-09-29 NOTE — Procedures (Signed)
[place]  Patient:    [name] Short, [name] Short                            MRN: [id number] Proc. Date: 12/19/00 Attending:  [name]. [name], M.D.                           Procedure Report  PREOPERATIVE DIAGNOSES:  Several bilateral lower extremity claudication secondary to stenosis of left limb of aortobifemoral bypass graft with chronic occlusion of right limb of graft.  PROCEDURE:  Abdominal aortogram with bilateral lower extremity runoff via left common femoral approach.  SURGEON:  [name]. [name], M.D.  ANESTHESIA:  Local Xylocaine and Versed 2 mg intravenously.  DESCRIPTION OF PROCEDURE:  The patient was taken to the [facility] Peripheral Endovascular Lab, placed in the supine position, at which time the left groin was prepped with Betadine solution and draped in routine sterile manner. After infiltration with 1% Xylocaine, the left common femoral artery was entered percutaneously.  A guide wire was passed into the suprarenal aorta under fluoroscopic guidance.  Initially, it was unable to traverse the scar tissue in the left groin using a 5-French sheath and dilator, and this required using a Potts needle to exchange the standard guide wire for an Amplatz wire, and then the sheath would traverse this area of dense scar tissue.  Following this, a standard pigtail catheter was positioned in the suprarenal aorta, flush abdominal aortogram performed, injecting 20 cc of contrast at 20 cc/sec.  This revealed the aorta to be quite small with an anastomosis of an aortic graft about 2 cm distal to the renal arteries.  Both renal arteries were widely patent with no stenoses.  The aortic graft was patent with the left limb being patent down to the femoral level, the right side being chronically occluded.  The distal 3-4 cm of the left limb of the aortic graft was stenotic proximal to the left-to-right femoral-femoral crossover graft.  The catheter was withdrawn  into the terminal portion of the aortic graft, and bilateral lower extremity runoff performed, injecting 88 cc of contrast at 8 cc/sec.  This revealed longitudinal stenosis measuring 3-4 cm in length of the distal left limb of the aortobifemoral graft just proximal to the crossover graft.  The left-to-right femoral-femoral graft was widely patent with no evidence of stenosis.  The femoral arteries, superficial femoral arteries, popliteal arteries, and tibial vessels appeared normal bilaterally with good runoff.  Additional views of both inguinal regions were then obtained using both the LAO and and RAO projections to better delineate the stenosis in the left limb of the graft and the femoral-femoral anastomoses.  No further abnormalities were noted.  Having tolerated the procedure well, the sheath was removed.  No complications ensued, and the patient was taken to short stay in stable condition.  FINDINGS: 1. Patent aorto-to-left femoral bypass with chronic occlusion right    limb of aortobifemoral bypass. 2. Widely patent single renal arteries bilaterally. 3. Stenosis of the distal left limb of aortobifemoral bypass. 4. Normal runoff bilaterally with patent left-to-right    femoral-femoral bypass. DD:  12/19/00 TD:  12/19/00 Job: [id number]

## 2010-09-29 NOTE — Discharge Summary (Signed)
[place]  Patient:    [name] Short, [name] Short Visit Number: [id number] MRN: [id number]          Service Type: SUR Location: [id number] Attending Physician:  Colvin Caroli Dictated by:   Myrlene Broker, P.A. Admit Date:  12/25/2000 Discharge Date: 12/29/2000   CC:         Bernadene Person, M.D.  CVTS office  [name], M.D.   Discharge Summary  DATE OF BIRTH:  1928-02-08  PRIMARY ADMISSION DIAGNOSES:  Recurrent claudication symptoms, bilateral lower extremities with stenosis of the left limb of the aortobifemoral bypass graft per angiogram on December 19, 2000.  DISCHARGE DIAGNOSES:  Recurrent claudication symptoms, bilateral lower extremities with stenosis of the left limb of the aortobifemoral bypass graft per angiogram on December 19, 2000.  PAST MEDICAL HISTORY: 1. Peripheral vascular occlusive disease with aortobifemoral bypass graft in    1994 by Dr. Colvin Caroli. 2. Left to right fem-fem bypass graft in July 2000 by Dr. [name]. 3. Coronary artery bypass graft surgery done in 1993. 4. Gastroesophageal reflux disease. 5. Hyperlipidemia. 6. Asthmatic bronchitis. 7. Anxiety. 8. Hypothyroidism. 9. Non-insulin-dependent diabetes mellitus.  PROCEDURE:  Replacement of the left limb of the aortobifemoral bypass graft with revision of left to right fem-fem bypass graft and repair of the profunda femoris artery done on December 25, 2000.  HOSPITAL COURSE:  This patient is a 77 year old Caucasian female with a known history of peripheral vascular occlusive disease who is status post left to right fem-fem bypass in July 2000 which was successful with subsequent resolution of symptoms with bilateral ABIs in the 100% range.  Post procedure, unfortunately, over the last four months the symptoms have progressed and her ABIs are now 73% bilaterally.  Dopplers revealed a narrowing of the left femoral region in the origin of the left  to right fem-fem bypass which could be responsible for her symptoms.  An arteriogram on December 19, 2000 revealed stenosis of the distal left limb of the aortofemoral with normal runoff bilaterally.  Dr. Colvin Caroli had evaluated the patient and determined that she would benefit with proceeding with a revision of that portion which was scheduled for December 25, 2000.  Patient underwent the procedure as stated above on December 25, 2000.  She tolerated the procedure well.  Her postoperative course was essentially uneventful.  She had continued to have both lower extremities well perfused with 3+ dorsalis pedis and posterior tibial pulses bilaterally.  She did not have any cardiac or respiratory complications.  Her diet was advanced and was tolerated well.  She was passing flatus upon discharge.  She was ambulated daily.  She did not have any GI complications or wound complications which indicated any signs of infection.  On postoperative day #4 she continued to do well and was tolerating her diet and it was determined that patient would be stable to be discharged home.  CONDITION ON DISCHARGE:  Stable.  DISCHARGE MEDICATIONS:  1. Nitrostat 0.4 mg p.r.n.  2. Diazepam 10 mg q.h.s.  3. Synthroid 88 mcg one tablet q.a.m.  4. Nitro-Dur 0.3 mg one patch q.d. p.r.n.  5. Amaryl.  6. Lipitor 10 mg one q.a.m.  7. Propranolol 40 mg two q.a.m.  8. Zoloft 100 mg q.a.m.  9. Prevacid 30 mg q.a.m. 10. Norvasc 10 mg q.a.m. 11. Aspirin 325 mg q.a.m. 12. Tylenol for pain.  ALLERGIES:  CODEINE.  ACTIVITY:  Patient is instructed not to  do any driving, to avoid any heavy lifting or strenuous activity, to continue her breathing exercises, and walk daily.  DIET:  She is to follow a diabetic heart healthy diet as pre admission.  WOUND CARE:  She is told she can cleanse her wounds gently daily with soap and water and to be alert for any increased redness, swelling, or drainage from her  incisions.  DISCHARGE INSTRUCTIONS:  She is to call our office if she has any problems. Our number was given on her discharge instruction sheet as well as to watch out for any fevers over 101 degrees Fahrenheit.  FOLLOW-UP:  She has a staple removal appointment to be scheduled by our office in one week.  She is to schedule an appointment to see Dr. [name] at her staple removal appointment.  The office is to call her with that appointment time. Dictated by:   Myrlene Broker, P.A. Attending Physician:  Colvin Caroli DD:  01/10/01 TD:  01/10/01 Job: [id number]

## 2010-09-29 NOTE — Op Note (Signed)
Short, [name]   Short. [facility]                                  MRN:  [id number]           Operative Report                                  ATT:  [name]. [name], M.D.  Procedure:  12/25/00             DICT: PREOPERATIVE DIAGNOSIS:  Failing left limb of aortobifemoral bypass graft.  POSTOPERATIVE DIAGNOSIS:  Failing left limb of aortobifemoral bypass graft.  OPERATION: 1. Replacement of left limb of aortobifemoral bypass graft with 7 mm    Hemashield Dacron graft from the proximal left limb to the left common    femoral artery (end-to-side). 2. Revision of left to right femoral-femoral bypass. 3. Repair of profunda femoris artery.  SURGEON:  [name]. [name], M.D.  FIRST ASSISTANT:  Gina H. Collins, P.A.-C.  ANESTHESIA:  General endotracheal anesthesia.  PROCEDURE:  The patient was taken to the operating room and placed in the supine position at which time satisfactory general endotracheal anesthesia was administered.  The left groin and lower abdomen were prepped with Betadine scrub and solution and draped in the routine sterile manner.  The longitudinal incision was made through the previous scar in the left inguinal region and carried down through scar tissue.  The common, superficial and profunda femoris arteries were dissected free as well as the left-to-right femoral femoral bypass which arose from the left limb of the aortobifemoral bypass graft.  During this time, to prevent scar tissue a transverse laceration was made in the proximal area of the profunda which required occlusion of the vessels and heparinization with repair using several interrupted 6-0 Prolene sutures.  When this was completed the clamps were released and there was satisfactory repair with good Doppler flow.  The left limb of the aortobifemoral bypass graft had a longitudinal narrowing in it just above the femoral femoral graft extending well up  beneath the inguinal ligament.  I dissected this free as far proximally but could not get above this through the inguinal approach.  Therefore, a left lower quadrant transverse (transplant type) incision was made and carried down through the subcutaneous tissues.  A longitudinal incision was made in the external oblique over to the rectus sheath and the muscles were divided. Using the retroperitoneal approach, the left limb of the aortic graft was exposed at the level where the ureter crossed anteriorly.  The ureter was mobilized and carefully preserved.  A tunnel having been created between this incision and the inguinal incision, the patient was given additional dose of Heparin and the left limb of the graft in the mid portion was occluded proximally and distally and longitudinally with a #15 blade extending with Potts scissors.  A 7 mm Hemashield Dacron graft was beveled and sewn end-to-side using continuous 5-0 Prolene.  When this was completed, the graft was tunneled posterior to the ureter through the tunnel and attention was returned to the left groin area.  The femoral vessels were all occluded and the existing left-to- right femoral-femoral graft was transected at its origin from the common femoral artery and this arterotomy was extended down into the superficial femoral artery about two more cm.  The new 7 mm Hemashield Dacron graft was spatulated and anastomosed end-to-side and the left common femoral artery with 5-0 Prolene.  When this was completed, an opening was made in the graft to reimplant the femoral femoral cross-over graft with 5-0 Prolene. When this was completed, the clamps were released there was an excellent pulse in all limbs.  Protamine was given to reverse the Heparin.  Following adequate hemostasis the wounds were irrigated with saline. A Jackson-Pratt drain was brought out through an inferiorly based stab wound, secured with a silk suture and the wounds were  closed with Vicryl and clips in the groin.  0 PDS and 2-0 Vicryl was used to close the rectus sheath in the internal and external oblique aponeuroses and subcu was closed with 2-0 Vicryl and the skin with clips.  Sterile dressing applied.  The patient was taken to the recovery room in satisfactory condition. DD:  12/25/00 TD:  12/25/00 Job: [id number]

## 2010-09-29 NOTE — H&P (Signed)
[name], [name] Short                          ACCOUNT NO.:  [id number]   MEDICAL RECORD NO.:  [id number]                   PATIENT TYPE:  INP   LOCATION:  3743                                 FACILITY:  [facility]   PHYSICIAN:  Jaclyn Prime. Lucas Mallow, M.D.                DATE OF BIRTH:  [birth year]   DATE OF ADMISSION:  02/20/2002  DATE OF DISCHARGE:                                HISTORY & PHYSICAL   CHIEF COMPLAINT:  This 77 year old patient of [name] Short, M.D., and [name], M.D., came to the emergency room with a chief complaint of chest  pain.   HISTORY OF PRESENT ILLNESS:  The patient said that she developed left  anterior squeezing chest pain on the day prior to admission.  There was some  associated dyspnea.  There was no clear cut change in the pain with  exertion.  She said she had not felt well in about two weeks.  She had been  able to continue some walking over that time, however.   PAST MEDICAL HISTORY:  1. She has had a remote hysterectomy.  2. She had coronary artery bypass grafting in 1993.  At that time, she     stopped smoking.  3. She had eye surgery about seven years ago.  4. She has had three bypasses to her legs by [name]. [name], M.D.  The most     recent of these was on December 25, 2000.   FAMILY HISTORY:  Her father died in her 60s of a heart attack.  Her mother  died at 35 of tuberculosis.   SOCIAL HISTORY:  She is divorced.  She has three sons and one daughter.  One  of her children lives in [place], [place].  The others live in  [place], [place], [place], [place], and surrounding  communities.   CURRENT MEDICATIONS:  1. Nitrostat 0.4 mg p.r.n.  2. Diazepam 10 mg daily.  3. Synthroid 0.88 mg daily.  4. Nitro-Dur patch 0.3 mg one daily.  5. Lipitor 10 mg daily.  6. Actonel 35 mg once a week.  7. Amaryl 2 mg b.i.d.  8. Propranolol 40 mg b.i.d.  9. Prevacid 30 mg daily.  10.      Norvasc 10 mg daily.  11.       Aspirin one daily.  12.      Centrum vitamins.  13.      Calcium supplements.   REVIEW OF SYMPTOMS:  CONSTITUTIONAL:  She denies fevers, chills, or sweats.  EYES:  She wears corrective lenses.  She denies diplopia or blurring.  ENT:  No deafness or dizziness.  She does have a partial upper denture.  CARDIOVASCULAR:  See the history of present illness.  RESPIRATORY:  She does  have some shortness of breath with exertion.  Her smoking history is as  noted above.  She denies cough  or wheezing.  GI:  She has symptoms of hiatal  hernia and reflux.  GU:  No dysuria or polyuria.  She does have intermittent  incontinence of urine.  MUSCULOSKELETAL:  She is bothered by pain in her  joints and some swelling in her knees.  SKIN AND BREASTS:  No nodule.  She  does have scattered patches of rosacea.  NEUROLOGIC:  No faintness or  syncope.  PSYCHIATRIC:  Some anxiety.  No depression or hallucination.  ENDOCRINE:  She is hypothyroid and has noninsulin-dependent diabetes.  She  has had no symptoms of hypoglycemia.  LYMPH NODES:  No swelling in the neck,  axillae, or groin.  ALLERGIC/LYMPHATIC:  She has dysphoria from codeine.   PHYSICAL EXAMINATION:  VITAL SIGNS:  Blood pressure 160/80, pulse 60 and  regular, respirations 20 and unlabored.  GENERAL APPEARANCE:  She is a well-developed, well-nourished woman who looks  her stated age of 77.  She is oriented to person, place, and time.  Her mood  and affect suggest some anxiety.  HEENT:  Her conjunctivae and lids reflect no xanthelasma, icterus, or arcus  senilis.  She has most of her own teeth with a partial denture on the upper  jaw.  The oral mucosa reveals no pallor or cyanosis.  NECK:  Supple and symmetrical.  The trachea is midline and mobile.  There is  no palpable thyromegaly or cervical node.  No carotid bruit or JVD.  CHEST:  Her respiratory effort is normal.  Her lungs are clear to  auscultation and percussion.  BACK:  Straight without  tenderness.  NEUROLOGIC:  Her gait is not tested.  She could probably undergo a limited  stress test.  Muscle strength and tone are age appropriate without focal  weakness.  CARDIAC:  The cardiac apical impulse is cryptic.  The left border of cardiac  dullness is within the left anterior axillary line.  Her heart rhythm is  regular and rate is normal.  There is no gallop, click, or murmur.  The  digits of the nails reveal no clubbing or cyanosis.  SKIN:  The skin and subcutaneous tissues reveal no stasis dermatitis or  ulcer.  She does have patches of rosacea.  ABDOMEN:  Flat and nontender without palpable enlargement of the liver or  spleen.  The abdominal aorta is not palpable.  There is no bruit.  EXTREMITIES:  The femoral arteries are without bruits.  The pedal pulses are  fairly good.  She has 2+ DP and PT pulses on the right and has a posterior  tibial pulse palpable on the left with no dorsalis pedis pulse.  Her legs  reveal no edema.   LABORATORY DATA:  The initial laboratory data include an EKG, which shows  sinus rhythm with nonspecific T-wave flattening.  Her initial troponin I and  CK are negative.   IMPRESSION:  This 77 year old woman, who is 10 years post bypass surgery,  now presents with rather atypical chest pain.  Given then 10-year interval  between bypass surgery and the onset of chest discomfort, the possibility of  unstable angina ranks fairly high.  She will be hospitalized for additional  evaluation and further testing when that is available.                                               Jaclyn Prime. [place],  M.D.    DDG/MEDQ  D:  02/23/2002  T:  02/24/2002  Job:  [id number]   cc:   Aram Candela. Aleen Short, M.D.  [address]  [place]  [place] [address]  Fax: [phone]

## 2010-09-29 NOTE — Op Note (Signed)
NAME:  Colon, Jozee                   ACCOUNT NO.:  [id number]   MEDICAL RECORD NO.:  [id number]          PATIENT TYPE:  AMB   LOCATION:  SDS                          FACILITY:  [facility]   PHYSICIAN:  [name]. [name], M.D.  DATE OF BIRTH:  Jul 29, 1927   DATE OF PROCEDURE:  05/16/2006  DATE OF DISCHARGE:                               OPERATIVE REPORT   PREOPERATIVE DIAGNOSIS:  Bilateral hip claudication, status post  aortobifemoral bypass grafting and left-to-right femoral-femoral bypass  grafting.   PROCEDURE:  Abdominal aortogram with bilateral lower extremity runoff  via left common femoral approach.   SURGEON:  [name]. [name], M.D.   ANESTHESIA:  Local Xylocaine, Versed 2 mg intravenously, contrast 160  mL.   COMPLICATIONS:  None.   DESCRIPTION OF PROCEDURE:  The patient was taken to the [facility]  Peripheral Endovascular Lab, placed in the supine position at which time  both groins were prepped with Betadine solution and draped in routine  sterile manner.  After infiltration of 1% Xylocaine and administration  of 2 mg of Versed intravenously, the left common femoral artery was  entered at the anastomotic site.  An Amplatz wire was passed up the left  limb of the aortobifemoral graft which was patent, passing it easily up  to the suprarenal aorta.  A 5-French sheath and catheter were passed  over the dilator, dilator removed and a standard pigtail catheter  positioned in the suprarenal aorta.  Flush abdominal aortogram  performed, injecting 20 mL of contrast at 20 mL per second.  This  revealed the aorta to be widely patent but small in caliber.  There were  single renal arteries bilaterally which were widely patent.  Aortic  anastomosis was 2-3 cm distal to the renal arteries and had some  circumferential narrowing in this small aorta at the anastomotic site  approximating 40%.  Pressure gradients were checked for later in the  procedure after infusing 200 mcg of nitroglycerin  and there was no  pressure gradient across this area.  The aortic graft was widely patent  on the left side but the right limb was chronically occluded.  The left  limb of the graft had some tapered narrowing over its proximal 4-5 cm  which was not severe, approximating 30%.  The anastomosis was widely  patent to the left common femoral artery and there was a widely patent  left-to-right femoral-femoral bypass.   Additional views of this bypass were obtained and revealed no areas of  significant stenosis.  The catheter was withdrawn into the terminal  portion of the graft and bilateral lower extremity runoff performed  injecting 77 mL of contrast at 7 mL per second.  This revealed the  superficial femorals, popliteals, and tibial vessels to appear  essentially normal bilaterally with three-vessel runoff in both legs.   Additional views of the aortic anastomosis in the RAO projection were  obtained as well as the femoral anastomoses with the RAO projection,  confirming the above findings.  Having tolerated procedure well, the  sheath was removed from the left groin, adequate  compression applied.  No complications ensued.   FINDINGS:  1. Patent aortobifemoral bypass graft on the left side with chronic      occlusion of right limb of the graft.  2. Mild (40%) circumferential stenosis between native aorta and aortic      graft.  3. Widely patent single renal arteries bilaterally.  4. Widely patent left-to-right femoral-femoral bypass without      stenosis.  5. Normal-appearing superficial femoral and tibial vessels      bilaterally.           ______________________________  [name] [name], M.D.     JDL/MEDQ  D:  05/16/2006  T:  05/16/2006  Job:  [id number]

## 2010-09-29 NOTE — H&P (Signed)
[name] Short, [name] Short   [place]                                  MRN:  [id number]         History and Physical                                  ATT:  [name]. [name], M.D.                                   DICT: Durenda Age, P.A.-C. DATE OF BIRTH:  1928-02-10  CHIEF COMPLAINT:  AIOD.  HISTORY OF PRESENT ILLNESS:  [age]-year-old white female with known history of PVOD, status post left-to-right fem-fem bypass in July 2000, successful, with subsequent resolution of symptoms, with bilateral ABIs in the 100% range post procedure.  Unfortunately, over the last four months, these symptoms have progressed and her ABIs are now 73% bilaterally.  Dopplers reveal narrowing of the left femoral region at the origin of the left-to-right fem-fem bypass which could be responsible for her symptoms.  Arteriogram on December 19, 2000 revealed stenosis of the distal left limb of the aortobifemoral with normal runoff bilaterally.  Dr. [name]. [name] evaluated the patient, recommending to proceed with revision of that portion, which is scheduled for December 25, 2000.  The patient complains of buttock and hip and thigh pain. The pain is present at rest, although it is unclear if it is related to circulation versus other etiology.  No calf pain or foot pain.  No slow-healing ulcers or gangrenous changes.  No ischemic changes or peripheral edema.  No decrease in temperature.  She is short of breath and has dyspnea on exertion after walking, which is chronic; she also has some palpitations.  PAST MEDICAL HISTORY:  PVOD -- claudication.  Bilateral knee arthritis.  CAD, status post CABG.  Hypothyroidism.  Hypertension.  Anxiety disorder. Hypercholesterolemia.  GERD symptoms.  Questionable history of asthmatic bronchitis.  Rosacea.  PAST SURGICAL HISTORY:  Status post angiogram, December 19, 2000, Dr. [name]. Status post colon resection, date unknown.  Status  post hysterectomy in 1963. Status post left-to-right fem-fem bypass using a 7-mm Hemashield graft in July 2000, Dr. [name].  Status post aortobifemoral bypass graft, Dr. [name], in 1994.  Status post CABG x 2 in 1993.  Status post bilateral knee chondroplasties.  MEDICATIONS:  1. Synthroid 88 mcg q.d.  2. Nitro-Dur patch q.d.  3. Nitrostat 0.4 mg p.r.n.  4. Valium 10 mg q.d.  5. Lipitor 10 mg q.d.  6. Zoloft 100 mg q.d.  7. Norvasc 10 mg q.d.  8. Propranolol 40 mg b.i.d.  9. Prevacid 30 mg q.d. 10. Aspirin q.d.  ALLERGIES:  CODEINE.  REVIEW OF SYSTEMS:  See HPI and past medical history for significant positives.  No diabetes or kidney disease.  FAMILY HISTORY:  Mother died of TB.  Father died at 49 of MI.  SOCIAL HISTORY:  Divorced, 4 children, 10 grandchildren, 3 great-grandchildren.  She is a retired Building services engineer.  She denies any tobacco or alcohol intake.  PHYSICAL EXAMINATION:  GENERAL:  Well-developed, moderately obese 77 year old white female in no acute distress, alert and oriented x 3.  VITAL SIGNS:  Blood pressure 130/60, pulse 68, respirations 18.  HEENT:  Normocephalic, atraumatic.  PERRLA.  EOMI.  Funduscopic exam reveals no abnormalities.  The patient wears glasses.  NECK:  Supple.  No JVD, bruits or lymphadenopathy.  CHEST:  Symmetrical on inspiration.  LUNGS:  Clear to auscultation bilaterally.  CARDIOVASCULAR:  Regular rate and rhythm with no murmurs, rubs, or gallops, although there are distant sounds to auscultate.  ABDOMEN:  Soft, nontender.  Bowel sounds x 4.  No masses.  There is a midabdominal iliac bruit.  GU:  Deferred.  RECTAL:  Deferred.  EXTREMITIES:  No clubbing, cyanosis, or edema.  No ulcerations  Warm temperature.  PERIPHERAL PULSES:  Carotids 2+ bilaterally.  Femoral 1+ on the left, 2+ on the right, both with bruits audible.  Popliteal, dorsalis pedis and posterior tibialis absent bilaterally.  NEUROLOGIC:  Nonfocal.  Gait  steady.  DTRs 1+ on the right, 2+ on the left. Muscle strength 5/5.  ASSESSMENT AND PLAN:  Aortoiliac occlusive disease, for revision of the left limb aortofemoral bypass graft, inguinal versus retroperitoneal approach. Dr. [name] has seen and evaluated this patient prior to the admission and has explained the risks and benefits involved in the procedure and the patient has agreed to continue. DD:  12/25/00 TD:  12/25/00 Job: [id number]

## 2011-06-18 ENCOUNTER — Other Ambulatory Visit: Payer: Self-pay | Admitting: Endocrinology

## 2011-06-18 DIAGNOSIS — R270 Ataxia, unspecified: Secondary | ICD-10-CM

## 2011-06-18 DIAGNOSIS — R42 Dizziness and giddiness: Secondary | ICD-10-CM

## 2011-06-21 ENCOUNTER — Inpatient Hospital Stay: Admission: RE | Admit: 2011-06-21 | Payer: Self-pay | Source: Ambulatory Visit

## 2011-07-31 ENCOUNTER — Other Ambulatory Visit: Payer: Self-pay | Admitting: Neurology

## 2011-07-31 DIAGNOSIS — M79609 Pain in unspecified limb: Secondary | ICD-10-CM

## 2011-07-31 DIAGNOSIS — F419 Anxiety disorder, unspecified: Secondary | ICD-10-CM

## 2011-07-31 DIAGNOSIS — R413 Other amnesia: Secondary | ICD-10-CM

## 2011-07-31 DIAGNOSIS — E785 Hyperlipidemia, unspecified: Secondary | ICD-10-CM

## 2011-07-31 DIAGNOSIS — R209 Unspecified disturbances of skin sensation: Secondary | ICD-10-CM

## 2011-07-31 DIAGNOSIS — G544 Lumbosacral root disorders, not elsewhere classified: Secondary | ICD-10-CM

## 2011-07-31 DIAGNOSIS — I10 Essential (primary) hypertension: Secondary | ICD-10-CM

## 2011-07-31 DIAGNOSIS — F341 Dysthymic disorder: Secondary | ICD-10-CM

## 2011-07-31 DIAGNOSIS — E119 Type 2 diabetes mellitus without complications: Secondary | ICD-10-CM

## 2011-07-31 DIAGNOSIS — I2510 Atherosclerotic heart disease of native coronary artery without angina pectoris: Secondary | ICD-10-CM

## 2011-08-04 ENCOUNTER — Ambulatory Visit
Admission: RE | Admit: 2011-08-04 | Discharge: 2011-08-04 | Disposition: A | Discharge status: Home / Self Care | Payer: BLUE CROSS/BLUE SHIELD | Source: Ambulatory Visit | Attending: Neurology | Admitting: Neurology

## 2011-08-04 DIAGNOSIS — E119 Type 2 diabetes mellitus without complications: Secondary | ICD-10-CM

## 2011-08-04 DIAGNOSIS — I10 Essential (primary) hypertension: Secondary | ICD-10-CM

## 2011-08-04 DIAGNOSIS — R413 Other amnesia: Secondary | ICD-10-CM

## 2011-08-04 DIAGNOSIS — R209 Unspecified disturbances of skin sensation: Secondary | ICD-10-CM

## 2011-08-04 DIAGNOSIS — G544 Lumbosacral root disorders, not elsewhere classified: Secondary | ICD-10-CM

## 2011-08-04 DIAGNOSIS — F329 Major depressive disorder, single episode, unspecified: Secondary | ICD-10-CM

## 2011-08-04 DIAGNOSIS — I2510 Atherosclerotic heart disease of native coronary artery without angina pectoris: Secondary | ICD-10-CM

## 2011-08-04 DIAGNOSIS — E785 Hyperlipidemia, unspecified: Secondary | ICD-10-CM

## 2011-08-04 DIAGNOSIS — M79609 Pain in unspecified limb: Secondary | ICD-10-CM

## 2011-10-15 DIAGNOSIS — I672 Cerebral atherosclerosis: Secondary | ICD-10-CM

## 2011-10-15 HISTORY — DX: Cerebral atherosclerosis: I67.2

## 2012-02-01 DIAGNOSIS — F068 Other specified mental disorders due to known physiological condition: Secondary | ICD-10-CM

## 2012-03-03 ENCOUNTER — Encounter: Payer: Self-pay | Admitting: Emergency Medicine

## 2012-03-03 ENCOUNTER — Emergency Department
Admission: EM | Admit: 2012-03-03 | Discharge: 2012-03-03 | Disposition: A | Discharge status: Home / Self Care | Payer: Self-pay | Source: Home / Self Care

## 2012-03-03 DIAGNOSIS — Z111 Encounter for screening for respiratory tuberculosis: Secondary | ICD-10-CM

## 2012-03-03 MED ORDER — TUBERCULIN PPD 5 UNIT/0.1ML ID SOLN
5.0000 [IU] | Freq: Once | INTRADERMAL | Status: AC
  Administered 2012-03-03: 5 [IU] via INTRADERMAL

## 2012-03-03 NOTE — ED Notes (Signed)
Patient's daughter was with her and advised that she is going to assisted living today and that's why she needed the PPD test.  She also wanted a copy of the record and said the nurse at the facility would call us with results.

## 2012-03-03 NOTE — ED Notes (Signed)
PPd Test

## 2012-03-04 ENCOUNTER — Encounter (HOSPITAL_BASED_OUTPATIENT_CLINIC_OR_DEPARTMENT_OTHER): Payer: Self-pay | Admitting: Emergency Medicine

## 2012-03-04 ENCOUNTER — Emergency Department (HOSPITAL_BASED_OUTPATIENT_CLINIC_OR_DEPARTMENT_OTHER): Payer: Medicare Other

## 2012-03-04 ENCOUNTER — Emergency Department (HOSPITAL_BASED_OUTPATIENT_CLINIC_OR_DEPARTMENT_OTHER)
Admission: EM | Admit: 2012-03-04 | Discharge: 2012-03-04 | Disposition: A | Discharge status: Home / Self Care | Payer: Medicare Other | Attending: Emergency Medicine | Admitting: Emergency Medicine

## 2012-03-04 DIAGNOSIS — Y939 Activity, unspecified: Secondary | ICD-10-CM | POA: Insufficient documentation

## 2012-03-04 DIAGNOSIS — S4350XA Sprain of unspecified acromioclavicular joint, initial encounter: Secondary | ICD-10-CM | POA: Insufficient documentation

## 2012-03-04 DIAGNOSIS — F3289 Other specified depressive episodes: Secondary | ICD-10-CM | POA: Insufficient documentation

## 2012-03-04 DIAGNOSIS — Y9210 Unspecified residential institution as the place of occurrence of the external cause: Secondary | ICD-10-CM | POA: Insufficient documentation

## 2012-03-04 DIAGNOSIS — Z87891 Personal history of nicotine dependence: Secondary | ICD-10-CM | POA: Insufficient documentation

## 2012-03-04 DIAGNOSIS — Z7982 Long term (current) use of aspirin: Secondary | ICD-10-CM | POA: Insufficient documentation

## 2012-03-04 DIAGNOSIS — F0390 Unspecified dementia without behavioral disturbance: Secondary | ICD-10-CM | POA: Insufficient documentation

## 2012-03-04 DIAGNOSIS — E039 Hypothyroidism, unspecified: Secondary | ICD-10-CM | POA: Insufficient documentation

## 2012-03-04 DIAGNOSIS — S46912A Strain of unspecified muscle, fascia and tendon at shoulder and upper arm level, left arm, initial encounter: Secondary | ICD-10-CM

## 2012-03-04 DIAGNOSIS — I2510 Atherosclerotic heart disease of native coronary artery without angina pectoris: Secondary | ICD-10-CM | POA: Insufficient documentation

## 2012-03-04 DIAGNOSIS — M549 Dorsalgia, unspecified: Secondary | ICD-10-CM | POA: Insufficient documentation

## 2012-03-04 DIAGNOSIS — I10 Essential (primary) hypertension: Secondary | ICD-10-CM | POA: Insufficient documentation

## 2012-03-04 DIAGNOSIS — Z79899 Other long term (current) drug therapy: Secondary | ICD-10-CM | POA: Insufficient documentation

## 2012-03-04 DIAGNOSIS — R413 Other amnesia: Secondary | ICD-10-CM | POA: Insufficient documentation

## 2012-03-04 DIAGNOSIS — F329 Major depressive disorder, single episode, unspecified: Secondary | ICD-10-CM | POA: Insufficient documentation

## 2012-03-04 DIAGNOSIS — E119 Type 2 diabetes mellitus without complications: Secondary | ICD-10-CM | POA: Insufficient documentation

## 2012-03-04 DIAGNOSIS — S0990XA Unspecified injury of head, initial encounter: Secondary | ICD-10-CM

## 2012-03-04 DIAGNOSIS — W06XXXA Fall from bed, initial encounter: Secondary | ICD-10-CM | POA: Insufficient documentation

## 2012-03-04 HISTORY — DX: Disorder of thyroid, unspecified: E07.9

## 2012-03-04 HISTORY — DX: Depression, unspecified: F32.A

## 2012-03-04 HISTORY — DX: Atherosclerotic heart disease of native coronary artery without angina pectoris: I25.10

## 2012-03-04 HISTORY — DX: Essential (primary) hypertension: I10

## 2012-03-04 HISTORY — DX: Other amnesia: R41.3

## 2012-03-04 HISTORY — DX: Major depressive disorder, single episode, unspecified: F32.9

## 2012-03-04 LAB — GLUCOSE, CAPILLARY: Glucose-Capillary: 186 mg/dL — ABNORMAL HIGH (ref 70–99)

## 2012-03-04 MED ORDER — OXYCODONE-ACETAMINOPHEN 5-325 MG PO TABS
1.0000 | ORAL_TABLET | Freq: Once | ORAL | Status: AC
  Administered 2012-03-04: 1 via ORAL
  Filled 2012-03-04 (×2): qty 1

## 2012-03-04 MED ORDER — IBUPROFEN 800 MG PO TABS
800.0000 mg | ORAL_TABLET | Freq: Once | ORAL | Status: AC
  Administered 2012-03-04: 800 mg via ORAL
  Filled 2012-03-04: qty 1

## 2012-03-04 MED ORDER — HYDROCODONE-ACETAMINOPHEN 5-500 MG PO TABS: 1.0000 | ORAL_TABLET | Freq: Four times a day (QID) | ORAL | Status: DC | PRN

## 2012-03-04 NOTE — ED Provider Notes (Addendum)
History     CSN: [id number]  Arrival date & time 03/04/12  0630   First MD Initiated Contact with Patient 03/04/12 [phone]      No chief complaint on file.   (Consider location/radiation/quality/duration/timing/severity/associated sxs/prior treatment) The history is provided by the patient and the nursing home. The history is limited by the condition of the patient.  [name] Short is a 78 y.o. female history of mild dementia, diabetes, hypertension, hypothyroidism presenting with fall out of bed. She just went to the rehabilitation facility yesterday and she notes that the bed is smaller than her bed at home so she rolled and fell out of bed. She hit left side of her head on the floor and the left shoulder. She has left shoulder pain afterwards as well as back and hip pain. Denies LOC or syncope.   Level V caveat- dementia   Past Medical History  Diagnosis Date  . Diabetes mellitus without complication   . Hypertension   . Coronary artery disease   . Thyroid disease   . Depression   . Memory loss     History reviewed. No pertinent past surgical history.  History reviewed. No pertinent family history.  History  Substance Use Topics  . Smoking status: Former Games developer  . Smokeless tobacco: Never Used  . Alcohol Use: No    OB History    Grav Para Term Preterm Abortions TAB SAB Ect Mult Living                  Review of Systems  Musculoskeletal: Positive for back pain.       L shoulder pain   All other systems reviewed and are negative.    Allergies  Codeine  Home Medications   Current Outpatient Rx  Name Route Sig Dispense Refill  . ASPIRIN 81 MG PO TABS Oral Take 81 mg by mouth daily.    . ATORVASTATIN CALCIUM 40 MG PO TABS Oral Take 40 mg by mouth daily.    Marland Kitchen CLOPIDOGREL BISULFATE 75 MG PO TABS Oral Take 75 mg by mouth daily.    Marland Kitchen DIAZEPAM 10 MG PO TABS Oral Take 10 mg by mouth 2 (two) times daily.    Marland Kitchen DILTIAZEM HCL ER BEADS 240 MG PO CP24 Oral Take 240 mg by  mouth daily.    Marland Kitchen GLIMEPIRIDE 2 MG PO TABS Oral Take 2 mg by mouth daily before breakfast.    . HYDROCODONE-ACETAMINOPHEN 5-500 MG PO TABS Oral Take 1 tablet by mouth every 8 (eight) hours as needed.    . ISOSORBIDE MONONITRATE ER 60 MG PO TB24 Oral Take 60 mg by mouth daily.    Marland Kitchen LANSOPRAZOLE 30 MG PO CPDR Oral Take 30 mg by mouth 2 (two) times daily.    Marland Kitchen LEVOTHYROXINE SODIUM 88 MCG PO TABS Oral Take 88 mcg by mouth daily.    Marland Kitchen NITROGLYCERIN 0.4 MG SL SUBL Sublingual Place 0.4 mg under the tongue every 5 (five) minutes as needed.    Marland Kitchen OLMESARTAN MEDOXOMIL 40 MG PO TABS Oral Take 40 mg by mouth daily.    Marland Kitchen PAROXETINE HCL 30 MG PO TABS Oral Take 30 mg by mouth every morning.      BP 156/81  Pulse 75  Temp 97.6 F (36.4 C) (Oral)  Resp 16  SpO2 99%  Physical Exam  Nursing note and vitals reviewed. Constitutional: She is oriented to person, place, and time. She appears well-developed and well-nourished.       C  collar in place  HENT:  Head: Normocephalic.  Mouth/Throat: Oropharynx is clear and moist.  Eyes: Conjunctivae normal and EOM are normal. Pupils are equal, round, and reactive to light.  Neck: Normal range of motion. Neck supple.       C collar remained in place, no midline tenderness   Cardiovascular: Normal rate, regular rhythm and normal heart sounds.   Pulmonary/Chest: Effort normal and breath sounds normal. No respiratory distress. She has no wheezes. She has no rales. She exhibits no tenderness.  Abdominal: Soft. Bowel sounds are normal. She exhibits no distension. There is no tenderness.  Musculoskeletal: Normal range of motion.       Tenderness over L shoulder, dec ROM of L shoulder from pain. L elbow and L wrist nl ROM. No bruising observed. Mild tenderness over lower lumbarsacral spine. NL bilateral hip ROM. Pelvis stable.   Neurological: She is alert and oriented to person, place, and time. No cranial nerve deficit.  Skin: Skin is warm and dry.  Psychiatric: She has  a normal mood and affect. Her behavior is normal. Judgment and thought content normal.    ED Course  Procedures (including critical care time)  Labs Reviewed  GLUCOSE, CAPILLARY - Abnormal; Notable for the following:    Glucose-Capillary 186 (*)     All other components within normal limits   Dg Lumbar Spine Complete  03/04/2012  *RADIOLOGY REPORT*  Clinical Data: Pain after fall.  LUMBAR SPINE - COMPLETE 4+ VIEW  Comparison: None.  Findings: No fracture or spondylolisthesis is noted.  Degenerative disc disease is noted at L1-2 and L5 S1.  Hypertrophy of posterior facet joints of L5 S1 are noted as well.  IMPRESSION: Degenerative changes as described above.  No acute abnormality seen in the lumbar spine.   Original Report Authenticated By: Venita Sheffield., M.D.    Dg Pelvis 1-2 Views  03/04/2012  *RADIOLOGY REPORT*  Clinical Data: Pain after fall.  PELVIS - 1-2 VIEW  Comparison: None.  Findings: No fracture or dislocation is noted.  The sacroiliac and hip joints appear normal bilaterally.  Degenerative changes seen about the pubic symphysis.  Surgical clips are noted in both inguinal regions.  IMPRESSION: No acute abnormality seen in the pelvis.   Original Report Authenticated By: Venita Sheffield., M.D.    Ct Head Wo Contrast  03/04/2012  *RADIOLOGY REPORT*  Clinical Data:  Fall from bed.  Headache, neck pain  CT HEAD WITHOUT CONTRAST CT CERVICAL SPINE WITHOUT CONTRAST  Technique:  Multidetector CT imaging of the head and cervical spine was performed following the standard protocol without intravenous contrast.  Multiplanar CT image reconstructions of the cervical spine were also generated.  Comparison:  Brain MRI 08/04/2011  CT HEAD  Findings: No intracranial hemorrhage.  No parenchymal contusion. No midline shift or mass effect.  Basilar cisterns are patent. No skull base fracture.  No fluid in the paranasal sinuses or mastoid air cells.  There is generalized cortical atrophy and  periventricular white matter hypodensities similar to comparison MRI.  IMPRESSION: No intracranial trauma.  Atrophy and microvascular disease.  CT CERVICAL SPINE  Findings:  No prevertebral soft tissue swelling.  Normal alignment of the cervical vertebral bodies.  There is loss of disc space and osteophytosis of the lower cervical spine.  Normal facet articulation.  Normal precervical junction.  There is a calcification of the transverse ligaments posterior to the dens. No evidence of epidural or paraspinal hematoma.  IMPRESSION:  1.  No  evidence of cervical spine fracture. 2.  Multilevel disc osteophytic disease.   Original Report Authenticated By: Genevive Bi, M.D.    Ct Cervical Spine Wo Contrast  03/04/2012  *RADIOLOGY REPORT*  Clinical Data:  Fall from bed.  Headache, neck pain  CT HEAD WITHOUT CONTRAST CT CERVICAL SPINE WITHOUT CONTRAST  Technique:  Multidetector CT imaging of the head and cervical spine was performed following the standard protocol without intravenous contrast.  Multiplanar CT image reconstructions of the cervical spine were also generated.  Comparison:  Brain MRI 08/04/2011  CT HEAD  Findings: No intracranial hemorrhage.  No parenchymal contusion. No midline shift or mass effect.  Basilar cisterns are patent. No skull base fracture.  No fluid in the paranasal sinuses or mastoid air cells.  There is generalized cortical atrophy and periventricular white matter hypodensities similar to comparison MRI.  IMPRESSION: No intracranial trauma.  Atrophy and microvascular disease.  CT CERVICAL SPINE  Findings:  No prevertebral soft tissue swelling.  Normal alignment of the cervical vertebral bodies.  There is loss of disc space and osteophytosis of the lower cervical spine.  Normal facet articulation.  Normal precervical junction.  There is a calcification of the transverse ligaments posterior to the dens. No evidence of epidural or paraspinal hematoma.  IMPRESSION:  1.  No evidence of  cervical spine fracture. 2.  Multilevel disc osteophytic disease.   Original Report Authenticated By: Genevive Bi, M.D.    Dg Shoulder Left  03/04/2012  *RADIOLOGY REPORT*  Clinical Data: Post fall, now with left shoulder pain  LEFT SHOULDER - 2+ VIEW  Comparison: None.  Findings:  No fracture or dislocation.  Glenohumeral joint space appear preserved.  Moderate degenerative change of the [facility] joint with joint space loss and osteophytosis.  Regional soft tissues are normal. No evidence of calcific tendonitis.  Limited visualization of the adjacent thorax suggest perihilar opacities.  No radiopaque foreign body.  IMPRESSION: 1.  No acute findings. 2.  Moderate degenerative change at the [facility] joint.   Original Report Authenticated By: Waynard Reeds, M.D.      1. Closed head injury   2. Left shoulder strain       MDM  Magin Michon is a 78 y.o. female hx of DM, HTN, mild dementia here s/p fall out of bed. Will get CT head/neck, xrays, give pain meds and reassess.   7:55 AM CT head/neck nl, C collar cleared. Xray L shoulder, lumbar, pelvis showed no fractures. Will d/c home with pain meds and outpatient f/u. Will give L arm sling for comfort and have her f/u with ortho.       Richardean Canal, MD 03/04/12 1610  Richardean Canal, MD 03/04/12 [phone]

## 2012-03-04 NOTE — ED Notes (Signed)
C Collar removed by EDP 

## 2012-03-04 NOTE — ED Notes (Addendum)
Patient transported to X-ray via stretcher 

## 2012-03-04 NOTE — ED Notes (Signed)
Found at [employer], found on floor, vs bp 152/92 p 72, rr 16 prior to arrival

## 2012-03-04 NOTE — ED Notes (Signed)
Report received from Jana Hakim, RN, care assumed.  Pt in radiology.

## 2012-03-04 NOTE — ED Notes (Signed)
Pt reports getting up to go to br, started at [employer] yesterday. Pt states she forgot that she only had twin size bed at facility , she fell out of bed, hit shoulder and head on bed next to her

## 2012-03-23 ENCOUNTER — Encounter (HOSPITAL_BASED_OUTPATIENT_CLINIC_OR_DEPARTMENT_OTHER): Payer: Self-pay | Admitting: *Deleted

## 2012-03-23 ENCOUNTER — Emergency Department (HOSPITAL_BASED_OUTPATIENT_CLINIC_OR_DEPARTMENT_OTHER)
Admission: EM | Admit: 2012-03-23 | Discharge: 2012-03-23 | Disposition: A | Discharge status: Home / Self Care | Payer: Medicare Other | Attending: Emergency Medicine | Admitting: Emergency Medicine

## 2012-03-23 ENCOUNTER — Emergency Department (HOSPITAL_BASED_OUTPATIENT_CLINIC_OR_DEPARTMENT_OTHER): Payer: Medicare Other

## 2012-03-23 DIAGNOSIS — W1789XA Other fall from one level to another, initial encounter: Secondary | ICD-10-CM | POA: Insufficient documentation

## 2012-03-23 DIAGNOSIS — Z79899 Other long term (current) drug therapy: Secondary | ICD-10-CM | POA: Insufficient documentation

## 2012-03-23 DIAGNOSIS — Y9210 Unspecified residential institution as the place of occurrence of the external cause: Secondary | ICD-10-CM | POA: Insufficient documentation

## 2012-03-23 DIAGNOSIS — Z7982 Long term (current) use of aspirin: Secondary | ICD-10-CM | POA: Insufficient documentation

## 2012-03-23 DIAGNOSIS — W19XXXA Unspecified fall, initial encounter: Secondary | ICD-10-CM

## 2012-03-23 DIAGNOSIS — F3289 Other specified depressive episodes: Secondary | ICD-10-CM | POA: Insufficient documentation

## 2012-03-23 DIAGNOSIS — I2510 Atherosclerotic heart disease of native coronary artery without angina pectoris: Secondary | ICD-10-CM | POA: Insufficient documentation

## 2012-03-23 DIAGNOSIS — E119 Type 2 diabetes mellitus without complications: Secondary | ICD-10-CM | POA: Insufficient documentation

## 2012-03-23 DIAGNOSIS — F329 Major depressive disorder, single episode, unspecified: Secondary | ICD-10-CM | POA: Insufficient documentation

## 2012-03-23 DIAGNOSIS — S5000XA Contusion of unspecified elbow, initial encounter: Secondary | ICD-10-CM | POA: Insufficient documentation

## 2012-03-23 DIAGNOSIS — Y93E1 Activity, personal bathing and showering: Secondary | ICD-10-CM | POA: Insufficient documentation

## 2012-03-23 DIAGNOSIS — Z87891 Personal history of nicotine dependence: Secondary | ICD-10-CM | POA: Insufficient documentation

## 2012-03-23 DIAGNOSIS — I10 Essential (primary) hypertension: Secondary | ICD-10-CM | POA: Insufficient documentation

## 2012-03-23 NOTE — ED Provider Notes (Signed)
History    This chart was scribed for Geoffery Lyons, MD, MD by Smitty Pluck, ED Scribe. The patient was seen in room [facility] and the patient's care was started at 7:33PM.   CSN: [id number]  Arrival date & time 03/23/12  4098       Chief Complaint  Patient presents with  . Fall    (Consider location/radiation/quality/duration/timing/severity/associated sxs/prior treatment) The history is provided by the patient. No language interpreter was used.   [name] Short is a 78 y.o. female who presents to the Emergency Department BIB EMS from [facility] place complaining of fall today. Pt reports that she was taking a bath and slipped in the water causing her to fall on the floor. She reports having constant, moderate left arm pain that has been hurting for a Tweedy time. Denies LOC, head injury, hip pain, chest pain, neck pain, taking blood anticoagulants, back pain and any other pain.   Past Medical History  Diagnosis Date  . Diabetes mellitus without complication   . Hypertension   . Coronary artery disease   . Thyroid disease   . Depression   . Memory loss     History reviewed. No pertinent past surgical history.  History reviewed. No pertinent family history.  History  Substance Use Topics  . Smoking status: Former Games developer  . Smokeless tobacco: Never Used  . Alcohol Use: No    OB History    Grav Para Term Preterm Abortions TAB SAB Ect Mult Living                  Review of Systems  All other systems reviewed and are negative.  10 Systems reviewed and all are negative for acute change except as noted in the HPI.    Allergies  Codeine  Home Medications   Current Outpatient Rx  Name  Route  Sig  Dispense  Refill  . ATORVASTATIN CALCIUM 40 MG PO TABS   Oral   Take 40 mg by mouth daily.         Marland Kitchen DIAZEPAM 10 MG PO TABS   Oral   Take 10 mg by mouth 2 (two) times daily.         Marland Kitchen DILTIAZEM HCL ER BEADS 240 MG PO CP24   Oral   Take 240 mg by mouth daily.         Marland Kitchen  GLIMEPIRIDE 2 MG PO TABS   Oral   Take 2 mg by mouth daily before breakfast.         . HYDROCODONE-ACETAMINOPHEN 5-500 MG PO TABS   Oral   Take 1 tablet by mouth every 8 (eight) hours as needed.         . ISOSORBIDE MONONITRATE ER 60 MG PO TB24   Oral   Take 60 mg by mouth daily.         Marland Kitchen LANSOPRAZOLE 30 MG PO CPDR   Oral   Take 30 mg by mouth 2 (two) times daily.         Marland Kitchen LEVOTHYROXINE SODIUM 88 MCG PO TABS   Oral   Take 88 mcg by mouth daily.         Marland Kitchen METOPROLOL TARTRATE 25 MG PO TABS   Oral   Take 25 mg by mouth 2 (two) times daily.         Marland Kitchen NITROGLYCERIN 0.4 MG SL SUBL   Sublingual   Place 0.4 mg under the tongue every 5 (five) minutes as needed.         Marland Kitchen  OLMESARTAN MEDOXOMIL 40 MG PO TABS   Oral   Take 40 mg by mouth daily.         Marland Kitchen PAROXETINE HCL 30 MG PO TABS   Oral   Take 30 mg by mouth every morning.         . ASPIRIN 81 MG PO TABS   Oral   Take 81 mg by mouth daily.         Marland Kitchen CLOPIDOGREL BISULFATE 75 MG PO TABS   Oral   Take 75 mg by mouth daily.         Marland Kitchen HYDROCODONE-ACETAMINOPHEN 5-500 MG PO TABS   Oral   Take 1 tablet by mouth every 6 (six) hours as needed for pain.   10 tablet   0     BP 165/77  Pulse 70  Temp 100 F (37.8 C) (Oral)  Resp 18  SpO2 96%  Physical Exam  Nursing note and vitals reviewed. Constitutional: She is oriented to person, place, and time. She appears well-developed and well-nourished. No distress.  HENT:  Head: Normocephalic and atraumatic.  Eyes: EOM are normal. Pupils are equal, round, and reactive to light.  Neck: Normal range of motion. Neck supple. No tracheal deviation present.  Cardiovascular: Normal rate, regular rhythm and normal heart sounds.   Pulmonary/Chest: Effort normal and breath sounds normal. No respiratory distress. She has no wheezes. She has no rales.  Abdominal: Soft. She exhibits no distension.  Musculoskeletal: Normal range of motion.       No c-spine, t-spine  or l-spine tenderness  Tenderness over lateral aspect of left elbow but has full ROM Bilateral hips stable   Neurological: She is alert and oriented to person, place, and time.  Skin: Skin is warm and dry.  Psychiatric: She has a normal mood and affect. Her behavior is normal.    ED Course  Procedures (including critical care time) DIAGNOSTIC STUDIES: Oxygen Saturation is 96% on room air, normal by my interpretation.    COORDINATION OF CARE: 7:34 PM Discussed ED treatment with pt     Labs Reviewed - No data to display No results found.   No diagnosis found.    MDM  No injuries found except chronic changes in the radial head.   I personally performed the services described in this documentation, which was scribed in my presence. The recorded information has been reviewed and is accurate.         Geoffery Lyons, MD 03/23/12 2102

## 2012-03-23 NOTE — ED Notes (Signed)
Patient transported to X-ray 

## 2012-03-23 NOTE — ED Notes (Signed)
Pt denies any pain, alert and oriented. SR up x2.

## 2012-03-23 NOTE — ED Notes (Signed)
MD at bedside. 

## 2012-03-23 NOTE — ED Notes (Signed)
Call placed for pt transport 

## 2012-03-23 NOTE — ED Notes (Signed)
Pt report given to staff at [facility]. [facility] here for pt transport, NAD noted upon transfer.

## 2012-03-23 NOTE — ED Notes (Signed)
Pt here for fall from [facility], pt denies pain at this time. Pt denies LOC.

## 2012-05-30 ENCOUNTER — Inpatient Hospital Stay (HOSPITAL_BASED_OUTPATIENT_CLINIC_OR_DEPARTMENT_OTHER)
Admission: EM | Admit: 2012-05-30 | Discharge: 2012-06-03 | DRG: 287 | Disposition: A | Discharge status: Home / Self Care | Payer: Medicare Other | Attending: Cardiovascular Disease | Admitting: Cardiovascular Disease

## 2012-05-30 ENCOUNTER — Emergency Department (HOSPITAL_BASED_OUTPATIENT_CLINIC_OR_DEPARTMENT_OTHER): Payer: Medicare Other

## 2012-05-30 ENCOUNTER — Encounter (HOSPITAL_BASED_OUTPATIENT_CLINIC_OR_DEPARTMENT_OTHER): Payer: Self-pay

## 2012-05-30 DIAGNOSIS — I509 Heart failure, unspecified: Secondary | ICD-10-CM | POA: Diagnosis present

## 2012-05-30 DIAGNOSIS — Z87891 Personal history of nicotine dependence: Secondary | ICD-10-CM

## 2012-05-30 DIAGNOSIS — I200 Unstable angina: Secondary | ICD-10-CM | POA: Diagnosis present

## 2012-05-30 DIAGNOSIS — I4891 Unspecified atrial fibrillation: Secondary | ICD-10-CM | POA: Diagnosis present

## 2012-05-30 DIAGNOSIS — Z951 Presence of aortocoronary bypass graft: Secondary | ICD-10-CM

## 2012-05-30 DIAGNOSIS — I517 Cardiomegaly: Secondary | ICD-10-CM

## 2012-05-30 DIAGNOSIS — Z23 Encounter for immunization: Secondary | ICD-10-CM

## 2012-05-30 DIAGNOSIS — E119 Type 2 diabetes mellitus without complications: Secondary | ICD-10-CM | POA: Diagnosis present

## 2012-05-30 DIAGNOSIS — R079 Chest pain, unspecified: Secondary | ICD-10-CM

## 2012-05-30 DIAGNOSIS — I472 Ventricular tachycardia, unspecified: Secondary | ICD-10-CM | POA: Diagnosis present

## 2012-05-30 DIAGNOSIS — F3289 Other specified depressive episodes: Secondary | ICD-10-CM | POA: Diagnosis present

## 2012-05-30 DIAGNOSIS — I252 Old myocardial infarction: Secondary | ICD-10-CM

## 2012-05-30 DIAGNOSIS — R413 Other amnesia: Secondary | ICD-10-CM | POA: Diagnosis present

## 2012-05-30 DIAGNOSIS — Z9861 Coronary angioplasty status: Secondary | ICD-10-CM

## 2012-05-30 DIAGNOSIS — E785 Hyperlipidemia, unspecified: Secondary | ICD-10-CM | POA: Diagnosis present

## 2012-05-30 DIAGNOSIS — I739 Peripheral vascular disease, unspecified: Secondary | ICD-10-CM | POA: Diagnosis present

## 2012-05-30 DIAGNOSIS — Z85828 Personal history of other malignant neoplasm of skin: Secondary | ICD-10-CM

## 2012-05-30 DIAGNOSIS — F329 Major depressive disorder, single episode, unspecified: Secondary | ICD-10-CM | POA: Diagnosis present

## 2012-05-30 DIAGNOSIS — Z7982 Long term (current) use of aspirin: Secondary | ICD-10-CM

## 2012-05-30 DIAGNOSIS — I2510 Atherosclerotic heart disease of native coronary artery without angina pectoris: Secondary | ICD-10-CM | POA: Diagnosis present

## 2012-05-30 DIAGNOSIS — I4729 Other ventricular tachycardia: Secondary | ICD-10-CM | POA: Diagnosis present

## 2012-05-30 DIAGNOSIS — I10 Essential (primary) hypertension: Secondary | ICD-10-CM | POA: Diagnosis present

## 2012-05-30 HISTORY — DX: Peripheral vascular disease, unspecified: I73.9

## 2012-05-30 LAB — BASIC METABOLIC PANEL
Chloride: 102 mEq/L (ref 96–112)
Creatinine, Ser: 1.3 mg/dL — ABNORMAL HIGH (ref 0.50–1.10)
GFR calc Af Amer: 43 mL/min — ABNORMAL LOW (ref >90)
GFR calc non Af Amer: 37 mL/min — ABNORMAL LOW (ref >90)

## 2012-05-30 LAB — CBC WITH DIFFERENTIAL/PLATELET
Basophils Absolute: 0 10*3/uL (ref 0.0–0.1)
Basophils Relative: 0 % (ref 0–1)
Eosinophils Absolute: 0.1 10*3/uL (ref 0.0–0.7)
MCH: 29.6 pg (ref 26.0–34.0)
MCHC: 33.1 g/dL (ref 30.0–36.0)
Monocytes Absolute: 0.8 10*3/uL (ref 0.1–1.0)
Neutro Abs: 8.9 10*3/uL — ABNORMAL HIGH (ref 1.7–7.7)
Neutrophils Relative %: 75 % (ref 43–77)
RDW: 14.1 % (ref 11.5–15.5)

## 2012-05-30 LAB — PRO B NATRIURETIC PEPTIDE: Pro B Natriuretic peptide (BNP): 3090 pg/mL — ABNORMAL HIGH (ref 0–450)

## 2012-05-30 NOTE — ED Notes (Signed)
Intermittent chest pain x 1 week.  Pt took 4 baby aspirin and 3 SL NTG PTA and pain free now.

## 2012-05-30 NOTE — ED Notes (Signed)
Pt. Reports she is having chest pain in the L chest off and on with rate of 55 at highest rate.  Pt. Still in A Fib at this time.

## 2012-05-30 NOTE — ED Provider Notes (Signed)
History     CSN: [id number]  Arrival date & time 05/30/12  Rickey Primus   First MD Initiated Contact with Patient 05/30/12 1832      Chief Complaint  Patient presents with  . Chest Pain    (Consider location/radiation/quality/duration/timing/severity/associated sxs/prior treatment) HPI Comments: Patient brought to the emergency department by ambulance for chest pain. Patient reports that she does have a significant cardiac history. She has had bypass surgery many years ago and also had stents placed. She reports that for the last 3 days she has become very short of breath and tired whenever she walks in the hallway. This is unusual for her. Today she had chest pain that was not relieved by nitroglycerin. She is brought to the ER by ambulance. She has been given for low-dose aspirin in addition to the nitroglycerin. Patient has received 2 doses of nitroglycerin and although she says it did not help initially, and time of arrival she is pain-free.  Patient does report that she often has chest pain. She reports that she does use nitroglycerin with some regularity, but generally gets relief after taking one or 2 nitroglycerin tablets.  Patient is a 79 y.o. female presenting with chest pain.  Chest Pain Primary symptoms include shortness of breath.  Associated symptoms include weakness.     Past Medical History  Diagnosis Date  . Diabetes mellitus without complication   . Hypertension   . Coronary artery disease   . Thyroid disease   . Depression   . Memory loss   . Skin cancer     No past surgical history on file.  No family history on file.  History  Substance Use Topics  . Smoking status: Former Games developer  . Smokeless tobacco: Never Used  . Alcohol Use: No    OB History    Grav Para Term Preterm Abortions TAB SAB Ect Mult Living                  Review of Systems  Respiratory: Positive for shortness of breath.   Cardiovascular: Positive for chest pain.  Neurological:  Positive for weakness.  All other systems reviewed and are negative.    Allergies  Codeine  Home Medications   Current Outpatient Rx  Name  Route  Sig  Dispense  Refill  . ASPIRIN 81 MG PO TABS   Oral   Take 81 mg by mouth daily.         . ATORVASTATIN CALCIUM 40 MG PO TABS   Oral   Take 40 mg by mouth daily.         Marland Kitchen CLOPIDOGREL BISULFATE 75 MG PO TABS   Oral   Take 75 mg by mouth daily.         Marland Kitchen DIAZEPAM 10 MG PO TABS   Oral   Take 5 mg by mouth 2 (two) times daily.          Marland Kitchen DILTIAZEM HCL ER BEADS 240 MG PO CP24   Oral   Take 240 mg by mouth daily.         Marland Kitchen GLIMEPIRIDE 2 MG PO TABS   Oral   Take 2 mg by mouth daily before breakfast.         . HYDROCODONE-ACETAMINOPHEN 5-500 MG PO TABS   Oral   Take 1 tablet by mouth every 8 (eight) hours as needed.         Marland Kitchen HYDROCODONE-ACETAMINOPHEN 5-500 MG PO TABS   Oral   Take 1  tablet by mouth every 6 (six) hours as needed for pain.   10 tablet   0   . ISOSORBIDE MONONITRATE ER 60 MG PO TB24   Oral   Take 60 mg by mouth daily.         Marland Kitchen LANSOPRAZOLE 30 MG PO CPDR   Oral   Take 30 mg by mouth 2 (two) times daily.         Marland Kitchen LEVOTHYROXINE SODIUM 88 MCG PO TABS   Oral   Take 88 mcg by mouth daily.         Marland Kitchen METOPROLOL TARTRATE 25 MG PO TABS   Oral   Take 25 mg by mouth 2 (two) times daily.         Marland Kitchen NITROGLYCERIN 0.4 MG SL SUBL   Sublingual   Place 0.4 mg under the tongue every 5 (five) minutes as needed.         Marland Kitchen OLMESARTAN MEDOXOMIL 40 MG PO TABS   Oral   Take 40 mg by mouth daily.         Marland Kitchen PAROXETINE HCL 30 MG PO TABS   Oral   Take 30 mg by mouth every morning.           SpO2 98%  Physical Exam  Constitutional: She is oriented to person, place, and time. She appears well-developed and well-nourished. No distress.  HENT:  Head: Normocephalic and atraumatic.  Right Ear: Hearing normal.  Nose: Nose normal.  Mouth/Throat: Oropharynx is clear and moist and mucous  membranes are normal.  Eyes: Conjunctivae normal and EOM are normal. Pupils are equal, round, and reactive to light.  Neck: Normal range of motion. Neck supple.  Cardiovascular: Normal rate, regular rhythm, S1 normal and S2 normal.  Exam reveals no gallop and no friction rub.   No murmur heard. Pulmonary/Chest: Effort normal and breath sounds normal. No respiratory distress. She exhibits no tenderness.  Abdominal: Soft. Normal appearance and bowel sounds are normal. There is no hepatosplenomegaly. There is no tenderness. There is no rebound, no guarding, no tenderness at McBurney's point and negative Murphy's sign. No hernia.  Musculoskeletal: Normal range of motion.  Neurological: She is alert and oriented to person, place, and time. She has normal strength. No cranial nerve deficit or sensory deficit. Coordination normal. GCS eye subscore is 4. GCS verbal subscore is 5. GCS motor subscore is 6.  Skin: Skin is warm, dry and intact. No rash noted. No cyanosis.  Psychiatric: She has a normal mood and affect. Her speech is normal and behavior is normal. Thought content normal.    ED Course  Procedures (including critical care time)   Date: 05/30/2012  Rate: 58  Rhythm: atrial fibrillation  QRS Axis: normal  Intervals: normal  ST/T Wave abnormalities: nonspecific T wave changes  Conduction Disutrbances:none  Narrative Interpretation:   Old EKG Reviewed: afib is new    Labs Reviewed  CBC WITH DIFFERENTIAL - Abnormal; Notable for the following:    WBC 11.9 (*)     Hemoglobin 11.8 (*)     HCT 35.6 (*)     Neutro Abs 8.9 (*)     All other components within normal limits  BASIC METABOLIC PANEL - Abnormal; Notable for the following:    Glucose, Bld 109 (*)     Creatinine, Ser 1.30 (*)     GFR calc non Af Amer 37 (*)     GFR calc Af Amer 43 (*)     All other  components within normal limits  PRO B NATRIURETIC PEPTIDE - Abnormal; Notable for the following:    Pro B Natriuretic peptide  (BNP) 3090.0 (*)     All other components within normal limits  TROPONIN I   Dg Chest 2 View  05/30/2012  *RADIOLOGY REPORT*  Clinical Data: Chest pain and shortness of breath for 1 week  CHEST - 2 VIEW  Comparison: Chest radiograph 02/23/2010  Findings: There is moderate enlargement of the cardiopericardial silhouette.  The cardiopericardial silhouette is significantly larger on today's chest radiograph compared to prior study of 2011. There are changes of median sternotomy for CABG. Numerous cardiac leads project over the chest.  There is mild pulmonary vascular congestion.  Lungs mildly hyperinflated.  No focal airspace disease or pleural effusion.  Negative for pneumothorax.  Degenerative changes of the acromioclavicular joints bilaterally.  IMPRESSION:  1.  Moderate enlargement of the cardiopericardial silhouette. Cardiopericardial silhouette is larger on today's compared to prior exams of 2011. The possibility of pericardial effusion cannot be excluded by chest radiograph.  Alternatively, progressive enlargement of the cardiac chambers could cause this appearance. 2.  Mild pulmonary vascular congestion without edema.   Original Report Authenticated By: Britta Mccreedy, M.D.      Diagnosis: 1. CHF 2. Chest Pain    MDM  Patient presents to the ER for evaluation of chest pain. Patient does have a history of coronary artery disease. It sounds like she is medical management at this point, having had bypass and multiple stents in the past. Patient does report that she has intermittent chest pain requiring nitroglycerin, but the pain she had today was unusual. She said felt like when she had her heart attack.   Patient was pain-free on arrival. She did have episodes of pain that was short-lived. She did have pain here in the ER that did not require intervention. EKG did not show any obvious ischemia. Rhythm appears to be atrial fibrillation which she does not have a history of. She does take Lopressor  and he is actually well controlled, in the 50s. She dropped to the 40s at times. Patient's troponin was negative. She does have a markedly elevated natruretic peptide. Additionally she has significant cardiomegaly, more so than on previous x-rays. This is concerning for congestive heart failure. Cannot rule out pericardial effusion. Therefore will not administer any anticoagulants. Patient is currently pain-free.  Patient will require hospitalization for further treatment. She is a patient of Dr. Allyson Sabal at [facility] cardiology. Case discussed with Dr. Toniann Fail, on call for [facility]. Patient has been accepted for transfer to [facility].        Gilda Crease, MD 05/30/12 [phone]

## 2012-05-30 NOTE — ED Notes (Signed)
Retrieved bed side commode for Pt. And placed socks on Pt. Feet for safety.  Pt. Has call bell in hand.

## 2012-05-30 NOTE — ED Notes (Signed)
Pt in no distress Reports she just feels like she needs to take a deep breath off and on.  No pain at this time.

## 2012-05-31 ENCOUNTER — Encounter (HOSPITAL_COMMUNITY): Payer: Self-pay | Admitting: *Deleted

## 2012-05-31 DIAGNOSIS — I200 Unstable angina: Secondary | ICD-10-CM | POA: Diagnosis present

## 2012-05-31 DIAGNOSIS — I4891 Unspecified atrial fibrillation: Secondary | ICD-10-CM

## 2012-05-31 DIAGNOSIS — I509 Heart failure, unspecified: Secondary | ICD-10-CM | POA: Diagnosis present

## 2012-05-31 DIAGNOSIS — I517 Cardiomegaly: Secondary | ICD-10-CM

## 2012-05-31 DIAGNOSIS — E119 Type 2 diabetes mellitus without complications: Secondary | ICD-10-CM

## 2012-05-31 DIAGNOSIS — R079 Chest pain, unspecified: Secondary | ICD-10-CM

## 2012-05-31 LAB — URINALYSIS, ROUTINE W REFLEX MICROSCOPIC
Hgb urine dipstick: NEGATIVE
Ketones, ur: NEGATIVE mg/dL
Protein, ur: NEGATIVE mg/dL
Urobilinogen, UA: 0.2 mg/dL (ref 0.0–1.0)

## 2012-05-31 LAB — CBC
HCT: 37 % (ref 36.0–46.0)
MCV: 89.6 fL (ref 78.0–100.0)
Platelets: 207 10*3/uL (ref 150–400)
RBC: 4.13 MIL/uL (ref 3.87–5.11)
RDW: 14.3 % (ref 11.5–15.5)
WBC: 9.2 10*3/uL (ref 4.0–10.5)

## 2012-05-31 LAB — COMPREHENSIVE METABOLIC PANEL
Albumin: 3.6 g/dL (ref 3.5–5.2)
Alkaline Phosphatase: 129 U/L — ABNORMAL HIGH (ref 39–117)
BUN: 14 mg/dL (ref 6–23)
Creatinine, Ser: 0.83 mg/dL (ref 0.50–1.10)
GFR calc Af Amer: 74 mL/min — ABNORMAL LOW (ref >90)
Glucose, Bld: 88 mg/dL (ref 70–99)
Total Protein: 6.5 g/dL (ref 6.0–8.3)

## 2012-05-31 LAB — TROPONIN I
Troponin I: <0.3 ng/mL (ref <0.30)
Troponin I: <0.3 ng/mL (ref <0.30)

## 2012-05-31 LAB — MAGNESIUM: Magnesium: 2 mg/dL (ref 1.5–2.5)

## 2012-05-31 LAB — PHOSPHORUS: Phosphorus: 4.4 mg/dL (ref 2.3–4.6)

## 2012-05-31 LAB — PROTIME-INR
INR: 1.09 (ref 0.00–1.49)
Prothrombin Time: 14 seconds (ref 11.6–15.2)

## 2012-05-31 LAB — GLUCOSE, CAPILLARY
Glucose-Capillary: 73 mg/dL (ref 70–99)
Glucose-Capillary: 78 mg/dL (ref 70–99)
Glucose-Capillary: 107 mg/dL — ABNORMAL HIGH (ref 70–99)

## 2012-05-31 LAB — HEMOGLOBIN A1C: Mean Plasma Glucose: 151 mg/dL — ABNORMAL HIGH (ref <117)

## 2012-05-31 MED ORDER — ENOXAPARIN SODIUM 40 MG/0.4ML ~~LOC~~ SOLN: 40.0000 mg | SUBCUTANEOUS | Status: DC

## 2012-05-31 MED ORDER — ASPIRIN 81 MG PO CHEW: 81.0000 mg | CHEWABLE_TABLET | Freq: Every day | ORAL | Status: DC

## 2012-05-31 MED ORDER — HYDROCODONE-ACETAMINOPHEN 5-325 MG PO TABS
1.0000 | ORAL_TABLET | Freq: Four times a day (QID) | ORAL | Status: DC | PRN
  Administered 2012-06-03: 1 via ORAL
  Filled 2012-05-31: qty 1

## 2012-05-31 MED ORDER — ATORVASTATIN CALCIUM 40 MG PO TABS
40.0000 mg | ORAL_TABLET | Freq: Every day | ORAL | Status: DC
  Administered 2012-05-31 – 2012-06-03 (×4): 40 mg via ORAL
  Filled 2012-05-31 (×4): qty 1

## 2012-05-31 MED ORDER — CLOPIDOGREL BISULFATE 75 MG PO TABS
75.0000 mg | ORAL_TABLET | Freq: Every day | ORAL | Status: DC
  Administered 2012-05-31 – 2012-06-03 (×4): 75 mg via ORAL
  Filled 2012-05-31 (×5): qty 1

## 2012-05-31 MED ORDER — SODIUM CHLORIDE 0.9 % IV SOLN: 250.0000 mL | INTRAVENOUS | Status: DC | PRN

## 2012-05-31 MED ORDER — INFLUENZA VIRUS VACC SPLIT PF IM SUSP
0.5000 mL | INTRAMUSCULAR | Status: AC
  Filled 2012-05-31: qty 0.5

## 2012-05-31 MED ORDER — PANTOPRAZOLE SODIUM 40 MG PO TBEC
40.0000 mg | DELAYED_RELEASE_TABLET | Freq: Every day | ORAL | Status: DC
  Administered 2012-05-31 – 2012-06-03 (×4): 40 mg via ORAL
  Filled 2012-05-31 (×4): qty 1

## 2012-05-31 MED ORDER — SODIUM CHLORIDE 0.9 % IJ SOLN: 3.0000 mL | Freq: Two times a day (BID) | INTRAMUSCULAR | Status: DC

## 2012-05-31 MED ORDER — SODIUM CHLORIDE 0.9 % IJ SOLN
3.0000 mL | Freq: Two times a day (BID) | INTRAMUSCULAR | Status: DC
  Administered 2012-05-31: 3 mL via INTRAVENOUS

## 2012-05-31 MED ORDER — ASPIRIN EC 325 MG PO TBEC
325.0000 mg | DELAYED_RELEASE_TABLET | Freq: Every day | ORAL | Status: DC
  Administered 2012-05-31 – 2012-06-03 (×4): 325 mg via ORAL
  Filled 2012-05-31 (×4): qty 1

## 2012-05-31 MED ORDER — SODIUM CHLORIDE 0.9 % IJ SOLN: 3.0000 mL | INTRAMUSCULAR | Status: DC | PRN

## 2012-05-31 MED ORDER — GLIMEPIRIDE 2 MG PO TABS
2.0000 mg | ORAL_TABLET | Freq: Every day | ORAL | Status: DC
Start: 2012-05-31 — End: 2012-06-03
  Administered 2012-05-31 – 2012-06-03 (×3): 2 mg via ORAL
  Filled 2012-05-31 (×5): qty 1

## 2012-05-31 MED ORDER — NITROGLYCERIN 0.4 MG SL SUBL: 0.4000 mg | SUBLINGUAL_TABLET | SUBLINGUAL | Status: DC | PRN

## 2012-05-31 MED ORDER — BACITRACIN 500 UNIT/GM EX OINT
1.0000 "application " | TOPICAL_OINTMENT | Freq: Two times a day (BID) | CUTANEOUS | Status: DC
  Administered 2012-05-31 – 2012-06-01 (×4): 1 via TOPICAL
  Filled 2012-05-31 (×8): qty 0.9

## 2012-05-31 MED ORDER — ACETAMINOPHEN 325 MG PO TABS: 650.0000 mg | ORAL_TABLET | Freq: Four times a day (QID) | ORAL | Status: DC | PRN

## 2012-05-31 MED ORDER — LEVOTHYROXINE SODIUM 88 MCG PO TABS
88.0000 ug | ORAL_TABLET | Freq: Every day | ORAL | Status: DC
  Administered 2012-05-31 – 2012-06-03 (×4): 88 ug via ORAL
  Filled 2012-05-31 (×5): qty 1

## 2012-05-31 MED ORDER — ISOSORBIDE MONONITRATE ER 60 MG PO TB24
60.0000 mg | ORAL_TABLET | Freq: Every day | ORAL | Status: DC
  Administered 2012-05-31 – 2012-06-03 (×4): 60 mg via ORAL
  Filled 2012-05-31 (×4): qty 1

## 2012-05-31 MED ORDER — ACETAMINOPHEN 650 MG RE SUPP: 650.0000 mg | Freq: Four times a day (QID) | RECTAL | Status: DC | PRN

## 2012-05-31 MED ORDER — DILTIAZEM HCL ER BEADS 240 MG PO CP24
240.0000 mg | ORAL_CAPSULE | Freq: Every day | ORAL | Status: DC
  Administered 2012-05-31 – 2012-06-03 (×4): 240 mg via ORAL
  Filled 2012-05-31 (×4): qty 1

## 2012-05-31 MED ORDER — ONDANSETRON HCL 4 MG/2ML IJ SOLN: 4.0000 mg | Freq: Four times a day (QID) | INTRAMUSCULAR | Status: DC | PRN

## 2012-05-31 MED ORDER — DIAZEPAM 5 MG PO TABS
5.0000 mg | ORAL_TABLET | Freq: Two times a day (BID) | ORAL | Status: DC
  Administered 2012-05-31 – 2012-06-03 (×7): 5 mg via ORAL
  Filled 2012-05-31 (×7): qty 1

## 2012-05-31 MED ORDER — ONDANSETRON HCL 4 MG PO TABS: 4.0000 mg | ORAL_TABLET | Freq: Four times a day (QID) | ORAL | Status: DC | PRN

## 2012-05-31 MED ORDER — INSULIN ASPART 100 UNIT/ML ~~LOC~~ SOLN
0.0000 [IU] | Freq: Three times a day (TID) | SUBCUTANEOUS | Status: DC
  Administered 2012-06-01: 2 [IU] via SUBCUTANEOUS

## 2012-05-31 MED ORDER — DOCUSATE SODIUM 100 MG PO CAPS
100.0000 mg | ORAL_CAPSULE | Freq: Two times a day (BID) | ORAL | Status: DC
  Administered 2012-05-31 – 2012-06-03 (×6): 100 mg via ORAL
  Filled 2012-05-31 (×9): qty 1

## 2012-05-31 MED ORDER — INSULIN ASPART 100 UNIT/ML ~~LOC~~ SOLN: 0.0000 [IU] | Freq: Every day | SUBCUTANEOUS | Status: DC

## 2012-05-31 MED ORDER — METOPROLOL TARTRATE 25 MG PO TABS
25.0000 mg | ORAL_TABLET | Freq: Two times a day (BID) | ORAL | Status: DC
  Administered 2012-05-31 – 2012-06-01 (×3): 25 mg via ORAL
  Filled 2012-05-31 (×4): qty 1

## 2012-05-31 MED ORDER — PAROXETINE HCL 30 MG PO TABS
30.0000 mg | ORAL_TABLET | Freq: Every day | ORAL | Status: DC
  Administered 2012-05-31 – 2012-06-03 (×4): 30 mg via ORAL
  Filled 2012-05-31 (×4): qty 1

## 2012-05-31 NOTE — Consult Note (Signed)
Reason for Consult:  CP Referring Physician:   Kerri Kovacik is an 79 y.o. female.  HPI:   She has a history of ischemic heart disease and peripheral vascular occlusive disease along with hypertension, chronic A-fib not on coumadin due to fall risk, hyperlipidemia, diabetes, remote tobacco abuse having quit 20 years ago. She has coronary artery bypass grafting x2 by Dr. Particia Lather March 01, 1992, with a vein to the right coronary artery and circumflex. She has also had aortobifemoral bypass grafting by Dr. Betti Cruz with a 12-x-17-mm Dacron Hemashield graft on January 23, 1993. On December 02, 1998, she had patch angioplasty to the distal aspect of her left aortobifemoral bypass limb with left-to-right fem-fem crossover grafting with a 7-mm Hemashield Dacron graft. On December 25, 2000, she had replacement of the left aortobifem bypass graft with revision of the fem-fem crossover graft, as well as repair of her profunda femoris by Dr. [name]. Dr. Allyson Sabal cath'd her Oct 06, 2009, revealing an occluded graft to the total RCA, left-to-right collaterals with normal LV function. Her aortobifem was intact. Medical therapy was recommended.   She presents today with centrally located chest pain which began approximately three days ago.  The pain is pressure-like.  Worse with exertion and eased with rest.  She also reports SOB, feeling tired for about a month, some LEE, questionable orthopnea, mild cough, constipation but denies nausea, vomiting, fever, dizziness, abd pain, dysuria, hematuria.  She also states her NTG SL is kept at the desk of the [facility] and she did not feel like going to get it.  BNP and SCr were elevated at 3K and 1.30 respectively.  When rechecked they were 1949 and 0.83.  Troponin is negative times two.   EKG shows afib with controlled rate and nonspecific T wave changes.  No old EKG to compare.     Past Medical History  Diagnosis Date  . Diabetes mellitus without complication     . Hypertension   . Coronary artery disease   . Thyroid disease   . Depression   . Memory loss   . Skin cancer   . PVD (peripheral vascular disease)     Past Surgical History  Procedure Date  . Coronary artery bypass graft   . Abdominal hysterectomy   . Joint replacement   . Cardiac catheterization 2012  . Coronary angioplasty   . Aorto- femoral bipass 2011    Family History  Problem Relation Age of Onset  . Heart disease Father   . Kidney failure Brother     Social History:  reports that she quit smoking about 21 years ago. She has never used smokeless tobacco. She reports that she does not drink alcohol or use illicit drugs.  Allergies:  Allergies  Allergen Reactions  . Codeine Itching    Medications:     . aspirin EC  325 mg Oral Daily  . atorvastatin  40 mg Oral Daily  . clopidogrel  75 mg Oral Q breakfast  . diazepam  5 mg Oral BID  . diltiazem  240 mg Oral Daily  . docusate sodium  100 mg Oral BID  . glimepiride  2 mg Oral QAC breakfast  . influenza  inactive virus vaccine  0.5 mL Intramuscular Tomorrow-1000  . insulin aspart  0-15 Units Subcutaneous TID WC  . insulin aspart  0-5 Units Subcutaneous QHS  . isosorbide mononitrate  60 mg Oral Daily  . levothyroxine  88 mcg Oral QAC breakfast  . metoprolol  tartrate  25 mg Oral BID  . pantoprazole  40 mg Oral Daily  . PARoxetine  30 mg Oral Daily  . sodium chloride  3 mL Intravenous Q12H  . sodium chloride  3 mL Intravenous Q12H     Results for orders placed during the hospital encounter of 05/30/12 (from the past 48 hour(s))  CBC WITH DIFFERENTIAL     Status: Abnormal   Collection Time   05/30/12  7:01 PM      Component Value Range Comment   WBC 11.9 (*) 4.0 - 10.5 K/uL    RBC 3.99  3.87 - 5.11 MIL/uL    Hemoglobin 11.8 (*) 12.0 - 15.0 g/dL    HCT 19.1 (*) 47.8 - 46.0 %    MCV 89.2  78.0 - 100.0 fL    MCH 29.6  26.0 - 34.0 pg    MCHC 33.1  30.0 - 36.0 g/dL    RDW 29.5  62.1 - 30.8 %    Platelets  222  150 - 400 K/uL    Neutrophils Relative 75  43 - 77 %    Neutro Abs 8.9 (*) 1.7 - 7.7 K/uL    Lymphocytes Relative 17  12 - 46 %    Lymphs Abs 2.1  0.7 - 4.0 K/uL    Monocytes Relative 7  3 - 12 %    Monocytes Absolute 0.8  0.1 - 1.0 K/uL    Eosinophils Relative 1  0 - 5 %    Eosinophils Absolute 0.1  0.0 - 0.7 K/uL    Basophils Relative 0  0 - 1 %    Basophils Absolute 0.0  0.0 - 0.1 K/uL   BASIC METABOLIC PANEL     Status: Abnormal   Collection Time   05/30/12  7:01 PM      Component Value Range Comment   Sodium 142  135 - 145 mEq/L    Potassium 3.9  3.5 - 5.1 mEq/L    Chloride 102  96 - 112 mEq/L    CO2 26  19 - 32 mEq/L    Glucose, Bld 109 (*) 70 - 99 mg/dL    BUN 20  6 - 23 mg/dL    Creatinine, Ser 6.57 (*) 0.50 - 1.10 mg/dL    Calcium 9.2  8.4 - 84.6 mg/dL    GFR calc non Af Amer 37 (*) >90 mL/min    GFR calc Af Amer 43 (*) >90 mL/min   PRO B NATRIURETIC PEPTIDE     Status: Abnormal   Collection Time   05/30/12  7:01 PM      Component Value Range Comment   Pro B Natriuretic peptide (BNP) 3090.0 (*) 0 - 450 pg/mL   TROPONIN I     Status: Normal   Collection Time   05/30/12  7:01 PM      Component Value Range Comment   Troponin I <0.30  <0.30 ng/mL   MRSA PCR SCREENING     Status: Normal   Collection Time   05/31/12 12:19 AM      Component Value Range Comment   MRSA by PCR NEGATIVE  NEGATIVE   PROTIME-INR     Status: Normal   Collection Time   05/31/12  5:00 AM      Component Value Range Comment   Prothrombin Time 14.0  11.6 - 15.2 seconds    INR 1.09  0.00 - 1.49   MAGNESIUM     Status: Normal   Collection Time  05/31/12  5:50 AM      Component Value Range Comment   Magnesium 2.0  1.5 - 2.5 mg/dL   PHOSPHORUS     Status: Normal   Collection Time   05/31/12  5:50 AM      Component Value Range Comment   Phosphorus 4.4  2.3 - 4.6 mg/dL   COMPREHENSIVE METABOLIC PANEL     Status: Abnormal   Collection Time   05/31/12  5:50 AM      Component Value Range Comment    Sodium 143  135 - 145 mEq/L    Potassium 4.0  3.5 - 5.1 mEq/L    Chloride 103  96 - 112 mEq/L    CO2 28  19 - 32 mEq/L    Glucose, Bld 88  70 - 99 mg/dL    BUN 14  6 - 23 mg/dL    Creatinine, Ser 1.61  0.50 - 1.10 mg/dL    Calcium 9.2  8.4 - 09.6 mg/dL    Total Protein 6.5  6.0 - 8.3 g/dL    Albumin 3.6  3.5 - 5.2 g/dL    AST 43 (*) 0 - 37 U/L    ALT 47 (*) 0 - 35 U/L    Alkaline Phosphatase 129 (*) 39 - 117 U/L    Total Bilirubin 0.5  0.3 - 1.2 mg/dL    GFR calc non Af Amer 64 (*) >90 mL/min    GFR calc Af Amer 74 (*) >90 mL/min   CBC     Status: Normal   Collection Time   05/31/12  5:50 AM      Component Value Range Comment   WBC 9.2  4.0 - 10.5 K/uL    RBC 4.13  3.87 - 5.11 MIL/uL    Hemoglobin 12.2  12.0 - 15.0 g/dL    HCT 04.5  40.9 - 81.1 %    MCV 89.6  78.0 - 100.0 fL    MCH 29.5  26.0 - 34.0 pg    MCHC 33.0  30.0 - 36.0 g/dL    RDW 91.4  78.2 - 95.6 %    Platelets 207  150 - 400 K/uL   TROPONIN I     Status: Normal   Collection Time   05/31/12  5:50 AM      Component Value Range Comment   Troponin I <0.30  <0.30 ng/mL   PRO B NATRIURETIC PEPTIDE     Status: Abnormal   Collection Time   05/31/12  5:50 AM      Component Value Range Comment   Pro B Natriuretic peptide (BNP) 1951.0 (*) 0 - 450 pg/mL   GLUCOSE, CAPILLARY     Status: Normal   Collection Time   05/31/12  6:21 AM      Component Value Range Comment   Glucose-Capillary 73  70 - 99 mg/dL     Dg Chest 2 View  06/27/863  *RADIOLOGY REPORT*  Clinical Data: Chest pain and shortness of breath for 1 week  CHEST - 2 VIEW  Comparison: Chest radiograph 02/23/2010  Findings: There is moderate enlargement of the cardiopericardial silhouette.  The cardiopericardial silhouette is significantly larger on today's chest radiograph compared to prior study of 2011. There are changes of median sternotomy for CABG. Numerous cardiac leads project over the chest.  There is mild pulmonary vascular congestion.  Lungs mildly  hyperinflated.  No focal airspace disease or pleural effusion.  Negative for pneumothorax.  Degenerative changes of the acromioclavicular  joints bilaterally.  IMPRESSION:  1.  Moderate enlargement of the cardiopericardial silhouette. Cardiopericardial silhouette is larger on today's compared to prior exams of 2011. The possibility of pericardial effusion cannot be excluded by chest radiograph.  Alternatively, progressive enlargement of the cardiac chambers could cause this appearance. 2.  Mild pulmonary vascular congestion without edema.   Original Report Authenticated By: Britta Mccreedy, M.D.     Review of Systems  Constitutional: Negative for fever and diaphoresis.  HENT: Negative for congestion and sore throat.   Respiratory: Positive for cough (Ocassionally) and shortness of breath.   Cardiovascular: Positive for chest pain, orthopnea and leg swelling.  Gastrointestinal: Positive for constipation. Negative for nausea, vomiting, abdominal pain and blood in stool.  Genitourinary: Negative for dysuria and hematuria.  Musculoskeletal:       No radiation of pain  Neurological: Negative for dizziness.   Blood pressure 152/88, pulse 68, temperature 97.4 F (36.3 C), temperature source Oral, resp. rate 18, height 5' (1.524 m), weight 69.718 kg (153 lb 11.2 oz), SpO2 100.00%. Physical Exam  Constitutional: She is oriented to person, place, and time. She appears well-developed and well-nourished. No distress.  HENT:  Head: Normocephalic and atraumatic.  Eyes: EOM are normal. Pupils are equal, round, and reactive to light. No scleral icterus.  Neck: Normal range of motion. Neck supple.  Cardiovascular: Normal rate.  An irregularly irregular rhythm present.  No murmur heard. Pulses:      Radial pulses are 2+ on the right side, and 2+ on the left side.       Dorsalis pedis pulses are 2+ on the right side, and 2+ on the left side.       No Carotid Bruits   Respiratory: Effort normal and breath  sounds normal. She has no wheezes. She has no rales.  GI: Soft. Bowel sounds are normal. There is no tenderness.  Musculoskeletal: She exhibits edema.       1+ LEE   Lymphadenopathy:    She has no cervical adenopathy.  Neurological: She is alert and oriented to person, place, and time. She exhibits normal muscle tone.  Skin: Skin is warm and dry.  Psychiatric: She has a normal mood and affect.    Assessment/Plan: Patient Active Hospital Problem List: CHF (congestive heart failure) (05/31/2012) Chest pain (05/31/2012) Cardiomegaly (05/31/2012) Diabetes mellitus type II, controlled (05/31/2012) Atrial fibrillation with controlled ventricular response (05/31/2012)   Plan:  EKG shows Afib with controlled rate and nonspecific T wave changes.  Troponin negative times two.  Chest pain is concerning for angina but appears to be reproducible with palpation.  Recommend Lexiscan myoview.  MD recs to follow.  Currently on ASA, lipitor, plavix, lopressor, diltiazem, Imdur, levothyroxine.  HAGER, BRYAN 05/31/2012, 8:11 AM      Agree with note written by Jones Skene PAC  Pt has known CAD and PVOD with total RCA and left to right collaterals in the past. Her symptoms are worrisome for angina. Exam benign. Enz neg. [place] currently on iv hep. EKG AFIB with CVR. Rec diagnostic cath to R/O progression of disease.  Runell Gess 06/02/2012 6:47 AM

## 2012-05-31 NOTE — H&P (Signed)
PCP:  Dr. Juleen [facility] Dr. Allyson Sabal   Chief Complaint:   Chest pain  HPI: [name] Short is a 79 y.o. female   has a past medical history of Diabetes mellitus without complication; Hypertension; Coronary artery disease; Thyroid disease; Depression; Memory loss; and Skin cancer.   Presented with  She reports having chest pain once in while for the past 1 week. For the past 3 days she have had sever fatigue she could hardly walk. She had again some chest pain yesterday that was relieved by nitroglycerine but she was so tired she was concerned and presented to ER She describes pain and discomfort in the left side not worse with deep breathes and not similar to her past MI. Marland Kitchen So far her troponin is negative and ECG is unremarkable except for possible A.fib. Her CXR showed worsening Cardiomegaly. She was transferred for admission and further work up. She denies having any blood in her stool. She is occasionally constipated. Patient reports frequent falls. She denies having hx of atrial fibrillation in the past.   She endorses some mild leg swelling but it has resolved.   Review of Systems:     Pertinent positives include:  Fatigue, chest pain,  dyspnea on exertion,   Constitutional:  No weight loss, night sweats, Fevers, chills, weight loss  HEENT:  No headaches, Difficulty swallowing,Tooth/dental problems,Sore throat,  No sneezing, itching, ear ache, nasal congestion, post nasal drip,  Cardio-vascular:  No Orthopnea, PND, anasarca, dizziness, palpitations.no Bilateral lower extremity swelling  GI:  No heartburn, indigestion, abdominal pain, nausea, vomiting, diarrhea, change in bowel habits, loss of appetite, melena, blood in stool, hematemesis Resp:  no shortness of breath at rest. NoNo excess mucus, no productive cough, No non-productive cough, No coughing up of blood.No change in color of mucus.No wheezing. Skin:  no rash or lesions. No jaundice GU:  no dysuria, change in color of  urine, no urgency or frequency. No straining to urinate.  No flank pain.  Musculoskeletal:  No joint pain or no joint swelling. No decreased range of motion. No back pain.  Psych:  No change in mood or affect. No depression or anxiety. No memory loss.  Neuro: no localizing neurological complaints, no tingling, no weakness, no double vision, no gait abnormality, no slurred speech, no confusion  Otherwise ROS are negative except for above, 10 systems were reviewed  Past Medical History: Past Medical History  Diagnosis Date  . Diabetes mellitus without complication   . Hypertension   . Coronary artery disease   . Thyroid disease   . Depression   . Memory loss   . Skin cancer    Past Surgical History  Procedure Date  . Coronary artery bypass graft   . Abdominal hysterectomy   . Joint replacement   . Cardiac catheterization 2012  . Coronary angioplasty      Medications: Prior to Admission medications   Medication Sig Start Date End Date Taking? Authorizing Provider  aspirin 81 MG tablet Take 81 mg by mouth daily.    Historical Provider, MD  atorvastatin (LIPITOR) 40 MG tablet Take 40 mg by mouth daily.    Historical Provider, MD  clopidogrel (PLAVIX) 75 MG tablet Take 75 mg by mouth daily.    Historical Provider, MD  diazepam (VALIUM) 10 MG tablet Take 5 mg by mouth 2 (two) times daily.     Historical Provider, MD  diltiazem (TIAZAC) 240 MG 24 hr capsule Take 240 mg by mouth daily.    Historical  Provider, MD  glimepiride (AMARYL) 2 MG tablet Take 2 mg by mouth daily before breakfast.    Historical Provider, MD  HYDROcodone-acetaminophen (VICODIN) 5-500 MG per tablet Take 1 tablet by mouth every 8 (eight) hours as needed.    Historical Provider, MD  HYDROcodone-acetaminophen (VICODIN) 5-500 MG per tablet Take 1 tablet by mouth every 6 (six) hours as needed for pain. 03/04/12   Richardean Canal, MD  isosorbide mononitrate (IMDUR) 60 MG 24 hr tablet Take 60 mg by mouth daily.     Historical Provider, MD  lansoprazole (PREVACID) 30 MG capsule Take 30 mg by mouth 2 (two) times daily.    Historical Provider, MD  levothyroxine (SYNTHROID, LEVOTHROID) 88 MCG tablet Take 88 mcg by mouth daily.    Historical Provider, MD  metoprolol tartrate (LOPRESSOR) 25 MG tablet Take 25 mg by mouth 2 (two) times daily.    Historical Provider, MD  nitroGLYCERIN (NITROSTAT) 0.4 MG SL tablet Place 0.4 mg under the tongue every 5 (five) minutes as needed.    Historical Provider, MD  olmesartan (BENICAR) 40 MG tablet Take 40 mg by mouth daily.    Historical Provider, MD  PARoxetine (PAXIL) 30 MG tablet Take 30 mg by mouth every morning.    Historical Provider, MD    Allergies:   Allergies  Allergen Reactions  . Codeine Itching    Social History:  Ambulatory   with walker  Lives at assisted living at [facility]   reports that she quit smoking about 21 years ago. She has never used smokeless tobacco. She reports that she does not drink alcohol or use illicit drugs.   Family History: family history includes Heart disease in her father and Kidney failure in her brother.    Physical Exam: Patient Vitals for the past 24 hrs:  BP Temp Temp src Pulse Resp SpO2 Height Weight  05/31/12 0142 149/79 mmHg 97.6 F (36.4 C) Oral 74  17  100 % - -  05/31/12 0005 181/86 mmHg 98.1 F (36.7 C) Oral 70  18  96 % 5' (1.524 m) 70.7 kg (155 lb 13.8 oz)  05/30/12 2159 151/81 mmHg - - 59  18  99 % - -  05/30/12 2028 144/77 mmHg - - 62  18  96 % - -  05/30/12 1831 - - - - - 98 % - -    1. General:  in No Acute distress 2. Psychological: Alert and Oriented 3. Head/ENT:   Moist   Mucous Membranes                          Head Non traumatic, neck supple                          Normal  Dentition 4. SKIN: normal  Skin turgor,  Skin clean Dry there is a scab on her nose from skin cancer removal  no rash 5. Heart: irregular rate and rhythm no Murmur, Rub or gallop 6. Lungs: Clear to auscultation  bilaterally, no wheezes or crackles   7. Abdomen: Soft, non-tender, Non distended 8. Lower extremities: no clubbing, cyanosis, or edema 9. Neurologically Grossly intact, moving all 4 extremities equally 10. MSK: Normal range of motion  body mass index is 30.44 kg/(m^2).   Labs on Admission:   [facility] 05/30/12 1901  NA 142  K 3.9  CL 102  CO2 26  GLUCOSE 109*  BUN  20  CREATININE 1.30*  CALCIUM 9.2  MG --  PHOS --   No results found for this basename: AST:2,ALT:2,ALKPHOS:2,BILITOT:2,PROT:2,ALBUMIN:2 in the last 72 hours No results found for this basename: LIPASE:2,AMYLASE:2 in the last 72 hours  Basename 05/30/12 1901  WBC 11.9*  NEUTROABS 8.9*  HGB 11.8*  HCT 35.6*  MCV 89.2  PLT 222    Basename 05/30/12 1901  CKTOTAL --  CKMB --  CKMBINDEX --  TROPONINI <0.30   No results found for this basename: TSH,T4TOTAL,FREET3,T3FREE,THYROIDAB in the last 72 hours No results found for this basename: VITAMINB12:2,FOLATE:2,FERRITIN:2,TIBC:2,IRON:2,RETICCTPCT:2 in the last 72 hours Lab Results  Component Value Date   HGBA1C  Value: 6.3 (NOTE)                                                                       According to the ADA Clinical Practice Recommendations for 2011, when HbA1c is used as a screening test:   >=6.5%   Diagnostic of Diabetes Mellitus           (if abnormal result  is confirmed)  5.7-6.4%   Increased risk of developing Diabetes Mellitus  References:Diagnosis and Classification of Diabetes Mellitus,Diabetes Care,2011,34(Suppl 1):S62-S69 and Standards of Medical Care in         Diabetes - 2011,Diabetes Care,2011,34  (Suppl 1):S11-S61.* 02/23/2010    Estimated Creatinine Clearance: 29.3 ml/min (by C-G formula based on Cr of 1.3). ABG    Component Value Date/Time   PHART 7.456* 02/23/2010 0639   HCO3 21.9 02/23/2010 0639   TCO2 22.9 02/23/2010 0639   ACIDBASEDEF 1.4 02/23/2010 0639   O2SAT 98.9 02/23/2010 0639     No results found for this basename:  DDIMER     Other results:  I have pearsonaly reviewed this: ECG REPORT  Rate: 58  Rhythm: Slow A. fib ST&T Change: no ischemic changes   Cultures: No results found for this basename: sdes, specrequest, cult, reptstatus       Radiological Exams on Admission: Dg Chest 2 View  05/30/2012  *RADIOLOGY REPORT*  Clinical Data: Chest pain and shortness of breath for 1 week  CHEST - 2 VIEW  Comparison: Chest radiograph 02/23/2010  Findings: There is moderate enlargement of the cardiopericardial silhouette.  The cardiopericardial silhouette is significantly larger on today's chest radiograph compared to prior study of 2011. There are changes of median sternotomy for CABG. Numerous cardiac leads project over the chest.  There is mild pulmonary vascular congestion.  Lungs mildly hyperinflated.  No focal airspace disease or pleural effusion.  Negative for pneumothorax.  Degenerative changes of the acromioclavicular joints bilaterally.  IMPRESSION:  1.  Moderate enlargement of the cardiopericardial silhouette. Cardiopericardial silhouette is larger on today's compared to prior exams of 2011. The possibility of pericardial effusion cannot be excluded by chest radiograph.  Alternatively, progressive enlargement of the cardiac chambers could cause this appearance. 2.  Mild pulmonary vascular congestion without edema.   Original Report Authenticated By: Britta Mccreedy, M.D.     Chart has been reviewed  Assessment/Plan   79 yo F with no prior hx of A. Fib here with slow A. Fib and atypical chest pain  Present on Admission:  . Atrial fibrillation with controlled ventricular response - continue her current  medications, she may be a poor candidate for anticoagulation given hx of recurrent falls, but would defer to her cardiologist. For now put on full dose aspirin. 2 D echo, TSH . CHF (congestive heart failure) - Will order 2 d echo, currently does not appear to be fluid overloaded.  . Chest pain - atypical  will cycle CE, serial ECG . Cardiomegaly - evaluate with 2d ECHO, clinically does not appear to have pericartitis.  . Diabetes mellitus type II, controlled -SSI    Prophylaxis:  Lovenox, Protonix  CODE STATUS: DNR/DNI as per patient   Other plan as per orders.  I have spent a total of 55 min on this admission  [name] Short 05/31/2012, 4:11 AM

## 2012-06-01 DIAGNOSIS — R0600 Dyspnea, unspecified: Secondary | ICD-10-CM

## 2012-06-01 HISTORY — DX: Dyspnea, unspecified: R06.00

## 2012-06-01 LAB — BASIC METABOLIC PANEL
GFR calc Af Amer: >90 mL/min (ref >90)
GFR calc non Af Amer: 80 mL/min — ABNORMAL LOW (ref >90)
Potassium: 4.2 mEq/L (ref 3.5–5.1)
Sodium: 140 mEq/L (ref 135–145)

## 2012-06-01 LAB — GLUCOSE, CAPILLARY: Glucose-Capillary: 106 mg/dL — ABNORMAL HIGH (ref 70–99)

## 2012-06-01 LAB — URINE CULTURE

## 2012-06-01 MED ORDER — SODIUM CHLORIDE 0.9 % IJ SOLN: 3.0000 mL | INTRAMUSCULAR | Status: DC | PRN

## 2012-06-01 MED ORDER — SODIUM CHLORIDE 0.9 % IV SOLN: 250.0000 mL | INTRAVENOUS | Status: DC | PRN

## 2012-06-01 MED ORDER — HEPARIN (PORCINE) IN NACL 100-0.45 UNIT/ML-% IJ SOLN
750.0000 [IU]/h | INTRAMUSCULAR | Status: DC
  Administered 2012-06-01: 750 [IU]/h via INTRAVENOUS
  Filled 2012-06-01 (×2): qty 250

## 2012-06-01 MED ORDER — METOPROLOL TARTRATE 25 MG PO TABS
37.5000 mg | ORAL_TABLET | Freq: Two times a day (BID) | ORAL | Status: DC
  Administered 2012-06-01 – 2012-06-03 (×4): 37.5 mg via ORAL
  Filled 2012-06-01 (×6): qty 1

## 2012-06-01 MED ORDER — NITROGLYCERIN IN D5W 200-5 MCG/ML-% IV SOLN
5.0000 ug/min | INTRAVENOUS | Status: DC
  Administered 2012-06-01: 5 ug/min via INTRAVENOUS
  Filled 2012-06-01: qty 250

## 2012-06-01 MED ORDER — SODIUM CHLORIDE 0.9 % IV SOLN
1.0000 mL/kg/h | INTRAVENOUS | Status: DC
  Administered 2012-06-02: 1 mL/kg/h via INTRAVENOUS

## 2012-06-01 MED ORDER — SODIUM CHLORIDE 0.9 % IJ SOLN: 3.0000 mL | Freq: Two times a day (BID) | INTRAMUSCULAR | Status: DC

## 2012-06-01 MED ORDER — HEPARIN BOLUS VIA INFUSION
3500.0000 [IU] | Freq: Once | INTRAVENOUS | Status: AC
  Administered 2012-06-01: 3500 [IU] via INTRAVENOUS
  Filled 2012-06-01: qty 3500

## 2012-06-01 MED ORDER — ASPIRIN 81 MG PO CHEW
324.0000 mg | CHEWABLE_TABLET | ORAL | Status: AC
  Administered 2012-06-02: 324 mg via ORAL
  Filled 2012-06-01: qty 4

## 2012-06-01 NOTE — Progress Notes (Signed)
[facility]  Subjective: CP this AM at 0530HRS.  6-7/10, "pressure-like", lasted ten minutes.  Objective: Vital signs in last 24 hours: Temp:  [97.3 F (36.3 C)-98.4 F (36.9 C)] 97.3 F (36.3 C) (01/19 0501) Pulse Rate:  [65-70] 67  (01/19 0501) Resp:  [18-20] 18  (01/19 0501) BP: (118-163)/(63-94) 118/82 mmHg (01/19 0945) SpO2:  [96 %-98 %] 98 % (01/19 0501) Weight:  [69.128 kg (152 lb 6.4 oz)] 69.128 kg (152 lb 6.4 oz) (01/19 0501) Last BM Date: 05/31/12  Intake/Output from previous day: 01/18 0701 - 01/19 0700 In: 920 [P.O.:920] Out: 2025 [Urine:2025] Intake/Output this shift: Total I/O In: 240 [P.O.:240] Out: -   Medications Current Facility-Administered Medications  Medication Dose Route Frequency Provider Last Rate Last Dose  . 0.9 %  sodium chloride infusion  250 mL Intravenous PRN Therisa Doyne, MD      . acetaminophen (TYLENOL) tablet 650 mg  650 mg Oral Q6H PRN Therisa Doyne, MD       Or  . acetaminophen (TYLENOL) suppository 650 mg  650 mg Rectal Q6H PRN Therisa Doyne, MD      . aspirin EC tablet 325 mg  325 mg Oral Daily Therisa Doyne, MD   325 mg at 06/01/12 0945  . atorvastatin (LIPITOR) tablet 40 mg  40 mg Oral Daily Therisa Doyne, MD   40 mg at 06/01/12 0947  . bacitracin ointment 1 application  1 application Topical BID Wilburt Finlay, PA   1 application at 06/01/12 0949  . clopidogrel (PLAVIX) tablet 75 mg  75 mg Oral Q breakfast Therisa Doyne, MD   75 mg at 06/01/12 0272  . diazepam (VALIUM) tablet 5 mg  5 mg Oral BID Therisa Doyne, MD   5 mg at 06/01/12 0948  . diltiazem (TIAZAC) 24 hr capsule 240 mg  240 mg Oral Daily Therisa Doyne, MD   240 mg at 06/01/12 0948  . docusate sodium (COLACE) capsule 100 mg  100 mg Oral BID Therisa Doyne, MD   100 mg at 06/01/12 0945  . glimepiride (AMARYL) tablet 2 mg  2 mg Oral QAC breakfast Therisa Doyne, MD   2 mg at 06/01/12 5366  .  HYDROcodone-acetaminophen (NORCO/VICODIN) 5-325 MG per tablet 1 tablet  1 tablet Oral Q6H PRN Therisa Doyne, MD      . influenza  inactive virus vaccine (FLUZONE/FLUARIX) injection 0.5 mL  0.5 mL Intramuscular Tomorrow-1000 Eduard Clos, MD      . insulin aspart (novoLOG) injection 0-15 Units  0-15 Units Subcutaneous TID WC Therisa Doyne, MD      . insulin aspart (novoLOG) injection 0-5 Units  0-5 Units Subcutaneous QHS Therisa Doyne, MD      . isosorbide mononitrate (IMDUR) 24 hr tablet 60 mg  60 mg Oral Daily Therisa Doyne, MD   60 mg at 06/01/12 0948  . levothyroxine (SYNTHROID, LEVOTHROID) tablet 88 mcg  88 mcg Oral QAC breakfast Therisa Doyne, MD   88 mcg at 06/01/12 4403  . metoprolol tartrate (LOPRESSOR) tablet 25 mg  25 mg Oral BID Therisa Doyne, MD   25 mg at 06/01/12 0948  . nitroGLYCERIN (NITROSTAT) SL tablet 0.4 mg  0.4 mg Sublingual Q5 min PRN Therisa Doyne, MD      . ondansetron (ZOFRAN) tablet 4 mg  4 mg Oral Q6H PRN Therisa Doyne, MD       Or  . ondansetron (ZOFRAN) injection 4 mg  4 mg Intravenous Q6H PRN Therisa Doyne, MD      .  pantoprazole (PROTONIX) EC tablet 40 mg  40 mg Oral Daily Therisa Doyne, MD   40 mg at 06/01/12 0948  . PARoxetine (PAXIL) tablet 30 mg  30 mg Oral Daily Therisa Doyne, MD   30 mg at 06/01/12 0948  . sodium chloride 0.9 % injection 3 mL  3 mL Intravenous Q12H Therisa Doyne, MD   3 mL at 05/31/12 2136  . sodium chloride 0.9 % injection 3 mL  3 mL Intravenous Q12H Anastassia Doutova, MD      . sodium chloride 0.9 % injection 3 mL  3 mL Intravenous PRN Therisa Doyne, MD        PE: General appearance: alert, cooperative and no distress Lungs: clear to auscultation bilaterally Heart: irregularly irregular rhythm Extremities: No LEE Pulses: 2+ and symmetric Skin: Warm and dry Neurologic: Grossly normal  Lab Results:   Basename 05/31/12 0550 05/30/12 1901  WBC 9.2 11.9*  HGB 12.2  11.8*  HCT 37.0 35.6*  PLT 207 222   BMET  Basename 06/01/12 0524 05/31/12 0550 05/30/12 1901  NA 140 143 142  K 4.2 4.0 3.9  CL 102 103 102  CO2 24 28 26   GLUCOSE 98 88 109*  BUN 12 14 20   CREATININE 0.66 0.83 1.30*  CALCIUM 9.2 9.2 9.2   PT/INR  Basename 05/31/12 0500  LABPROT 14.0  INR 1.09    Assessment/Plan  Active Problems:  CHF (congestive heart failure)  Chest pain  Cardiomegaly  Diabetes mellitus type II, controlled  Atrial fibrillation with controlled ventricular response  Plan: CP this  At 0753hrs this AM the patient had a 16 beat run of NSVT.   Will titrate lopressor to 37.5mg  twice daily.  Adding IV nitro/heparin. LHC tomorrow.    LOS: 2 days    [name] Short 06/01/2012 10:40 AM

## 2012-06-01 NOTE — Progress Notes (Signed)
Pt very hard stick. Iv started in rt wrist. Numerous other attempts by 4 nurses unsucessful.

## 2012-06-01 NOTE — Progress Notes (Signed)
  Echocardiogram 2D Echocardiogram has been performed.  [name] Short 06/01/2012, 4:27 PM

## 2012-06-01 NOTE — Progress Notes (Signed)
Pt. Seen and examined. Agree with the NP/PA-C note as written.  NSVT overnight, agree with increasing B-blocker dose. She has had bradycardia in the past and a-fib. Complaints of angina this morning. Still plans for [facility] on Monday.  Chrystie Nose, MD, [facility] Attending Cardiologist [facility]

## 2012-06-01 NOTE — Consult Note (Signed)
ANTICOAGULATION CONSULT NOTE - Follow Up Consult  Pharmacy Consult for Heparin Indication: atrial fibrillation, CP  Allergies  Allergen Reactions  . Codeine Itching    Patient Measurements: Height: 5' (152.4 cm) Weight: 152 lb 6.4 oz (69.128 kg) IBW/kg (Calculated) : 45.5  Heparin Dosing Weight: 60kg  Vital Signs: Temp: 97.3 F (36.3 C) (01/19 0501) Temp src: Oral (01/19 0501) BP: 118/82 mmHg (01/19 0945) Pulse Rate: 67  (01/19 0501)  Labs:  Basename 06/01/12 0524 05/31/12 1525 05/31/12 0945 05/31/12 0550 05/31/12 0500 05/30/12 1901  HGB -- -- -- 12.2 -- 11.8*  HCT -- -- -- 37.0 -- 35.6*  PLT -- -- -- 207 -- 222  APTT -- -- -- -- -- --  LABPROT -- -- -- -- 14.0 --  INR -- -- -- -- 1.09 --  HEPARINUNFRC -- -- -- -- -- --  CREATININE 0.66 -- -- 0.83 -- 1.30*  CKTOTAL -- -- -- -- -- --  CKMB -- -- -- -- -- --  TROPONINI -- <0.30 <0.30 <0.30 -- --    Estimated Creatinine Clearance: 47 ml/min (by C-G formula based on Cr of 0.66).   Medications:  No anticoagulants pta  Assessment: 84yof to begin heparin for afib with RVR and CP worrisome for  angina. Troponins negative x 3. Plan for cath tomorrow 1/20. Baseline CBC and renal function wnl.  Goal of Therapy:  Heparin level 0.3-0.7 units/ml Monitor platelets by anticoagulation protocol: Yes   Plan:  1) Heparin bolus 3500 units x 1 2) Heparin drip at 750 units/hr 3) 8 hour heparin level 4) Daily heparin level and CBC  Fredrik Rigger 06/01/2012,11:04 AM

## 2012-06-01 NOTE — Progress Notes (Signed)
Utilization review completed.  

## 2012-06-01 NOTE — Progress Notes (Signed)
ANTICOAGULATION CONSULT NOTE - Follow Up Consult  Pharmacy Consult for heparin Indication: chest pain/ACS and atrial fibrillation  Labs:  Basename 06/01/12 2138 06/01/12 0524 05/31/12 1525 05/31/12 0945 05/31/12 0550 05/31/12 0500 05/30/12 1901  HGB -- -- -- -- 12.2 -- 11.8*  HCT -- -- -- -- 37.0 -- 35.6*  PLT -- -- -- -- 207 -- 222  APTT -- -- -- -- -- -- --  LABPROT -- -- -- -- -- 14.0 --  INR -- -- -- -- -- 1.09 --  HEPARINUNFRC 0.36 -- -- -- -- -- --  CREATININE -- 0.66 -- -- 0.83 -- 1.30*  CKTOTAL -- -- -- -- -- -- --  CKMB -- -- -- -- -- -- --  TROPONINI -- -- <0.30 <0.30 <0.30 -- --    Assessment/Plan:  79yo female therapeutic on heparin with initial dosing for CP and Afib.  Will continue gtt at current rate and confirm stable with additional level.  Colleen Can PharmD BCPS 06/01/2012,11:02 PM

## 2012-06-02 ENCOUNTER — Encounter (HOSPITAL_COMMUNITY)
Admission: EM | Disposition: A | Discharge status: Home / Self Care | Payer: Self-pay | Source: Home / Self Care | Attending: Cardiovascular Disease

## 2012-06-02 HISTORY — PX: LEFT HEART CATHETERIZATION WITH CORONARY/GRAFT ANGIOGRAM: SHX5450

## 2012-06-02 HISTORY — PX: CARDIAC CATHETERIZATION: SHX172

## 2012-06-02 LAB — URINALYSIS, ROUTINE W REFLEX MICROSCOPIC
Bilirubin Urine: NEGATIVE
Glucose, UA: NEGATIVE mg/dL
Hgb urine dipstick: NEGATIVE
Specific Gravity, Urine: 1.024 (ref 1.005–1.030)
pH: 8 (ref 5.0–8.0)

## 2012-06-02 LAB — CBC
MCH: 29.7 pg (ref 26.0–34.0)
MCHC: 33.2 g/dL (ref 30.0–36.0)
MCV: 89.3 fL (ref 78.0–100.0)
Platelets: 189 10*3/uL (ref 150–400)
RBC: 4.11 MIL/uL (ref 3.87–5.11)
RDW: 14.3 % (ref 11.5–15.5)

## 2012-06-02 LAB — URINE MICROSCOPIC-ADD ON

## 2012-06-02 LAB — BASIC METABOLIC PANEL
BUN: 11 mg/dL (ref 6–23)
Calcium: 9 mg/dL (ref 8.4–10.5)
Creatinine, Ser: 0.69 mg/dL (ref 0.50–1.10)
GFR calc non Af Amer: 79 mL/min — ABNORMAL LOW (ref >90)
Glucose, Bld: 99 mg/dL (ref 70–99)

## 2012-06-02 LAB — GLUCOSE, CAPILLARY
Glucose-Capillary: 79 mg/dL (ref 70–99)
Glucose-Capillary: 117 mg/dL — ABNORMAL HIGH (ref 70–99)

## 2012-06-02 SURGERY — LEFT HEART CATHETERIZATION WITH CORONARY/GRAFT ANGIOGRAM
Anesthesia: LOCAL

## 2012-06-02 MED ORDER — HYDRALAZINE HCL 20 MG/ML IJ SOLN
10.0000 mg | INTRAMUSCULAR | Status: DC | PRN
  Administered 2012-06-02: 10 mg via INTRAVENOUS
  Filled 2012-06-02: qty 1

## 2012-06-02 MED ORDER — ACETAMINOPHEN 325 MG PO TABS: 650.0000 mg | ORAL_TABLET | ORAL | Status: DC | PRN

## 2012-06-02 MED ORDER — MIDAZOLAM HCL 2 MG/2ML IJ SOLN
INTRAMUSCULAR | Status: AC
  Filled 2012-06-02: qty 2

## 2012-06-02 MED ORDER — BACITRACIN ZINC 500 UNIT/GM EX OINT
TOPICAL_OINTMENT | Freq: Two times a day (BID) | CUTANEOUS | Status: AC
  Administered 2012-06-02: 09:00:00 via TOPICAL
  Filled 2012-06-02: qty 15

## 2012-06-02 MED ORDER — HEPARIN (PORCINE) IN NACL 2-0.9 UNIT/ML-% IJ SOLN
INTRAMUSCULAR | Status: AC
  Filled 2012-06-02: qty 1000

## 2012-06-02 MED ORDER — ALPRAZOLAM 0.25 MG PO TABS: 0.2500 mg | ORAL_TABLET | Freq: Three times a day (TID) | ORAL | Status: DC | PRN

## 2012-06-02 MED ORDER — ONDANSETRON HCL 4 MG/2ML IJ SOLN: 4.0000 mg | Freq: Four times a day (QID) | INTRAMUSCULAR | Status: DC | PRN

## 2012-06-02 MED ORDER — TRAMADOL HCL 50 MG PO TABS
50.0000 mg | ORAL_TABLET | Freq: Four times a day (QID) | ORAL | Status: DC | PRN
  Administered 2012-06-02: 50 mg via ORAL
  Filled 2012-06-02: qty 1

## 2012-06-02 MED ORDER — MORPHINE SULFATE 2 MG/ML IJ SOLN: 1.0000 mg | INTRAMUSCULAR | Status: DC | PRN

## 2012-06-02 MED ORDER — HYDROCORTISONE 1 % EX CREA
TOPICAL_CREAM | Freq: Four times a day (QID) | CUTANEOUS | Status: DC
  Administered 2012-06-02 – 2012-06-03 (×3): via TOPICAL
  Filled 2012-06-02 (×2): qty 28

## 2012-06-02 MED ORDER — SODIUM CHLORIDE 0.9 % IV SOLN
INTRAVENOUS | Status: AC
  Administered 2012-06-02: 17:00:00 via INTRAVENOUS

## 2012-06-02 MED ORDER — NITROGLYCERIN 0.2 MG/ML ON CALL CATH LAB
INTRAVENOUS | Status: AC
  Filled 2012-06-02: qty 1

## 2012-06-02 MED ORDER — ZOLPIDEM TARTRATE 5 MG PO TABS: 5.0000 mg | ORAL_TABLET | Freq: Every evening | ORAL | Status: DC | PRN

## 2012-06-02 MED ORDER — FENTANYL CITRATE 0.05 MG/ML IJ SOLN
INTRAMUSCULAR | Status: AC
  Filled 2012-06-02: qty 2

## 2012-06-02 MED ORDER — LIDOCAINE HCL (PF) 1 % IJ SOLN
INTRAMUSCULAR | Status: AC
  Filled 2012-06-02: qty 30

## 2012-06-02 NOTE — Plan of Care (Signed)
Problem: Phase I Progression Outcomes Goal: EF % per last Echo/documented,Core Reminder form on chart Outcome: Completed/Met Date Met:  06/02/12 EF 60-65%(06-01-12)

## 2012-06-02 NOTE — Progress Notes (Signed)
Subjective:  No CP/SOB last night  Objective:  Temp:  [97 F (36.1 C)-97.8 F (36.6 C)] 97.8 F (36.6 C) (01/20 0544) Pulse Rate:  [62-87] 62  (01/20 0544) Resp:  [19] 19  (01/19 2030) BP: (118-180)/(70-82) 154/75 mmHg (01/20 0544) SpO2:  [96 %-98 %] 96 % (01/20 0544) Weight:  [68.7 kg (151 lb 7.3 oz)] 68.7 kg (151 lb 7.3 oz) (01/20 0544) Weight change: -0.428 kg (-15.1 oz)  Intake/Output from previous day: 01/19 0701 - 01/20 0700 In: 696.5 [P.O.:560; I.V.:136.5] Out: 1400 [Urine:1400]  Intake/Output from this shift:    Physical Exam: General appearance: alert, cooperative and no distress Neck: no adenopathy, no carotid bruit, no JVD, supple, symmetrical, trachea midline and thyroid not enlarged, symmetric, no tenderness/mass/nodules Lungs: clear to auscultation bilaterally Heart: irregularly irregular rhythm Extremities: extremities normal, atraumatic, no cyanosis or edema  Lab Results: Results for orders placed during the hospital encounter of 05/30/12 (from the past 48 hour(s))  TROPONIN I     Status: Normal   Collection Time   05/31/12  9:45 AM      Component Value Range Comment   Troponin I <0.30  <0.30 ng/mL   GLUCOSE, CAPILLARY     Status: Normal   Collection Time   05/31/12 10:54 AM      Component Value Range Comment   Glucose-Capillary 78  70 - 99 mg/dL   TROPONIN I     Status: Normal   Collection Time   05/31/12  3:25 PM      Component Value Range Comment   Troponin I <0.30  <0.30 ng/mL   GLUCOSE, CAPILLARY     Status: Abnormal   Collection Time   05/31/12  3:49 PM      Component Value Range Comment   Glucose-Capillary 105 (*) 70 - 99 mg/dL   GLUCOSE, CAPILLARY     Status: Abnormal   Collection Time   05/31/12 10:18 PM      Component Value Range Comment   Glucose-Capillary 107 (*) 70 - 99 mg/dL   BASIC METABOLIC PANEL     Status: Abnormal   Collection Time   06/01/12  5:24 AM      Component Value Range Comment   Sodium 140  135 - 145 mEq/L    Potassium 4.2  3.5 - 5.1 mEq/L    Chloride 102  96 - 112 mEq/L    CO2 24  19 - 32 mEq/L    Glucose, Bld 98  70 - 99 mg/dL    BUN 12  6 - 23 mg/dL    Creatinine, Ser 4.09  0.50 - 1.10 mg/dL    Calcium 9.2  8.4 - 81.1 mg/dL    GFR calc non Af Amer 80 (*) >90 mL/min    GFR calc Af Amer >90  >90 mL/min   GLUCOSE, CAPILLARY     Status: Abnormal   Collection Time   06/01/12  6:04 AM      Component Value Range Comment   Glucose-Capillary 106 (*) 70 - 99 mg/dL    Comment 1 Notify RN     GLUCOSE, CAPILLARY     Status: Abnormal   Collection Time   06/01/12 11:20 AM      Component Value Range Comment   Glucose-Capillary 125 (*) 70 - 99 mg/dL    Comment 1 Notify RN     GLUCOSE, CAPILLARY     Status: Normal   Collection Time   06/01/12  4:01 PM  Component Value Range Comment   Glucose-Capillary 83  70 - 99 mg/dL   GLUCOSE, CAPILLARY     Status: Normal   Collection Time   06/01/12  8:49 PM      Component Value Range Comment   Glucose-Capillary 73  70 - 99 mg/dL   HEPARIN LEVEL (UNFRACTIONATED)     Status: Normal   Collection Time   06/01/12  9:38 PM      Component Value Range Comment   Heparin Unfractionated 0.36  0.30 - 0.70 IU/mL   BASIC METABOLIC PANEL     Status: Abnormal   Collection Time   06/02/12  5:55 AM      Component Value Range Comment   Sodium 141  135 - 145 mEq/L    Potassium 4.7  3.5 - 5.1 mEq/L HEMOLYSIS AT THIS LEVEL MAY AFFECT RESULT   Chloride 102  96 - 112 mEq/L    CO2 27  19 - 32 mEq/L    Glucose, Bld 99  70 - 99 mg/dL    BUN 11  6 - 23 mg/dL    Creatinine, Ser 1.61  0.50 - 1.10 mg/dL    Calcium 9.0  8.4 - 09.6 mg/dL    GFR calc non Af Amer 79 (*) >90 mL/min    GFR calc Af Amer >90  >90 mL/min   HEPARIN LEVEL (UNFRACTIONATED)     Status: Normal   Collection Time   06/02/12  5:55 AM      Component Value Range Comment   Heparin Unfractionated 0.36  0.30 - 0.70 IU/mL   CBC     Status: Normal   Collection Time   06/02/12  5:55 AM      Component Value Range  Comment   WBC 9.4  4.0 - 10.5 K/uL    RBC 4.11  3.87 - 5.11 MIL/uL    Hemoglobin 12.2  12.0 - 15.0 g/dL    HCT 04.5  40.9 - 81.1 %    MCV 89.3  78.0 - 100.0 fL    MCH 29.7  26.0 - 34.0 pg    MCHC 33.2  30.0 - 36.0 g/dL    RDW 91.4  78.2 - 95.6 %    Platelets 189  150 - 400 K/uL   GLUCOSE, CAPILLARY     Status: Normal   Collection Time   06/02/12  6:01 AM      Component Value Range Comment   Glucose-Capillary 84  70 - 99 mg/dL     Imaging: Imaging results have been reviewed  Assessment/Plan:   1. Active Problems: 2.  CHF (congestive heart failure) 3.  Chest pain 4.  Cardiomegaly 5.  Diabetes mellitus type II, controlled 6.  Atrial fibrillation with controlled ventricular response 7.   Time Spent Directly with Patient:  20 minutes  Length of Stay:  LOS: 3 days   Admitted with CP/SOB. Had 15 beats of NSVT yesterday.  BB adjusted. Labs ok. For cor angio today.  Runell Gess 06/02/2012, 8:42 AM

## 2012-06-02 NOTE — Progress Notes (Signed)
Utilization Review Completed Claudine Stallings J. Brolin Dambrosia, RN, BSN, NCM [phone]  

## 2012-06-02 NOTE — H&P (Signed)
  H & P will be scanned in.  Pt was reexamined and existing H & P reviewed. No changes found.  Runell Gess, MD [facility] 06/02/2012 3:05 PM

## 2012-06-02 NOTE — Progress Notes (Signed)
ANTICOAGULATION CONSULT NOTE - Follow Up Consult  Pharmacy Consult for Heparin Indication: atrial fibrillation, CP  Allergies  Allergen Reactions  . Codeine Itching    Patient Measurements: Height: 5' (152.4 cm) Weight: 151 lb 7.3 oz (68.7 kg) IBW/kg (Calculated) : 45.5  Heparin Dosing Weight: 60kg  Vital Signs: Temp: 97.8 F (36.6 C) (01/20 0544) Temp src: Oral (01/20 0544) BP: 154/75 mmHg (01/20 0544) Pulse Rate: 62  (01/20 0544)  Labs:  Basename 06/02/12 0555 06/01/12 2138 06/01/12 0524 05/31/12 1525 05/31/12 0945 05/31/12 0550 05/31/12 0500 05/30/12 1901  HGB 12.2 -- -- -- -- 12.2 -- --  HCT 36.7 -- -- -- -- 37.0 -- 35.6*  PLT 189 -- -- -- -- 207 -- 222  APTT -- -- -- -- -- -- -- --  LABPROT -- -- -- -- -- -- 14.0 --  INR -- -- -- -- -- -- 1.09 --  HEPARINUNFRC 0.36 0.36 -- -- -- -- -- --  CREATININE 0.69 -- 0.66 -- -- 0.83 -- --  CKTOTAL -- -- -- -- -- -- -- --  CKMB -- -- -- -- -- -- -- --  TROPONINI -- -- -- <0.30 <0.30 <0.30 -- --    Estimated Creatinine Clearance: 46.9 ml/min (by C-G formula based on Cr of 0.69).   Medications:  Heparin @ 750 units/hr  Assessment: 84yof continues on heparin with a therapeutic heparin level. CBC stable. No bleeding reported. Plan for cath today.  Goal of Therapy:  Heparin level 0.3-0.7 units/ml Monitor platelets by anticoagulation protocol: Yes   Plan:  1) Continue heparin at 750 units/hr 2) Follow up after cath  Fredrik Rigger 06/02/2012,8:10 AM

## 2012-06-02 NOTE — CV Procedure (Signed)
[name] Short is a 79 y.o. female    [id number] LOCATION:  FACILITY: [facility]  PHYSICIAN: Nanetta Batty, M.D. 06/19/1927   DATE OF PROCEDURE:  06/02/2012  DATE OF DISCHARGE:  [facility]  CARDIAC CATHETERIZATION     History obtained from chart review.[name] Short is an 79 y.o. female.  She has a history of ischemic heart disease and peripheral vascular occlusive disease along with hypertension, chronic A-fib not on coumadin due to fall risk, hyperlipidemia, diabetes, remote tobacco abuse having quit 20 years ago. She has coronary artery bypass grafting x2 by Dr. Particia Lather March 01, 1992, with a vein to the right coronary artery and circumflex. She has also had aortobifemoral bypass grafting by Dr. Betti Cruz with a 12-x-17-mm Dacron Hemashield graft on January 23, 1993. On December 02, 1998, she had patch angioplasty to the distal aspect of her left aortobifemoral bypass limb with left-to-right fem-fem crossover grafting with a 7-mm Hemashield Dacron graft. On December 25, 2000, she had replacement of the left aortobifem bypass graft with revision of the fem-fem crossover graft, as well as repair of her profunda femoris by Dr. [name]. Dr. Allyson Sabal cath'd her Oct 06, 2009, revealing an occluded graft to the total RCA, left-to-right collaterals with normal LV function. Her aortobifem was intact. Medical therapy was recommended.  She presents today with centrally located chest pain which began approximately three days ago. The pain is pressure-like. Worse with exertion and eased with rest. She also reports SOB, feeling tired for about a month, some LEE, questionable orthopnea, mild cough, constipation but denies nausea, vomiting, fever, dizziness, abd pain, dysuria, hematuria. She also states her NTG SL is kept at the desk of the [facility] and she did not feel like going to get it.  BNP and SCr were elevated at 3K and 1.30 respectively. When rechecked they were 1949 and  0.83. Troponin is negative times two. EKG shows afib with controlled rate and nonspecific T wave changes. No old EKG to compare. She presents now for diagnostic coronary arteriography to define her anatomy it has been 3 years since her last catheter lab an ischemic etiology.    PROCEDURE DESCRIPTION:    The patient was brought to the second floor  [facility] cath lab in the postabsorptive state. She was  premedicated with Valium 5 mg by mouth, IV Versed and fentanyl. Her left groinwas prepped and shaved in usual sterile fashion. Xylocaine 1% was used for local anesthesia. A 5 French sheath was inserted into the left aortofem bypass graft using standard Seldinger technique and an Amplatz wire. 5 French right and left Judkins diagnostic catheters all the 5 French pigtail catheter were used for selective coronary angiography and left ventriculography respectively. Visipaque dye was used for the entirety of the case. Retrograde aortic, left ventricular end pullback pressures were recorded.   HEMODYNAMICS:    AO SYSTOLIC/AO DIASTOLIC: 167/82   LV SYSTOLIC/LV DIASTOLIC: 170/12  ANGIOGRAPHIC RESULTS:   1. Left main; normal  2. LAD; normal 3. Left circumflex; the first OM branch had a segmental proximal 30% stenosis.  4. Right coronary artery; occluded proximally with grade 2-3 left-to-right collaterals 5. Left ventriculography; RAO left ventriculogram was performed using  25 mL of Visipaque dye at 12 mL/second. The overall LVEF estimated  60 % Without wall motion abnormalities  IMPRESSION:[name] has unchanged anatomy compared to her cath 3 years ago. The left coronary system is essentially free of significant disease and she has a totally  occluded RCA with left to right collaterals are normal laboratory function. I am unsure about the etiology of her progressive fatigue, chest pain and dyspnea. Continued medical therapy will be recommended. The sheath was removed and pressure will be  held on the groin to achieve hemostasis. Patient left the Cath Lab in stable condition.  Runell Gess MD, [facility] 06/02/2012 3:53 PM

## 2012-06-03 DIAGNOSIS — I2510 Atherosclerotic heart disease of native coronary artery without angina pectoris: Secondary | ICD-10-CM | POA: Diagnosis present

## 2012-06-03 DIAGNOSIS — I739 Peripheral vascular disease, unspecified: Secondary | ICD-10-CM | POA: Diagnosis present

## 2012-06-03 DIAGNOSIS — E785 Hyperlipidemia, unspecified: Secondary | ICD-10-CM | POA: Diagnosis present

## 2012-06-03 DIAGNOSIS — I472 Ventricular tachycardia: Secondary | ICD-10-CM | POA: Diagnosis present

## 2012-06-03 LAB — CBC
HCT: 38.7 % (ref 36.0–46.0)
Hemoglobin: 12.6 g/dL (ref 12.0–15.0)
MCH: 29.2 pg (ref 26.0–34.0)
MCHC: 32.6 g/dL (ref 30.0–36.0)
MCV: 89.6 fL (ref 78.0–100.0)
Platelets: 204 K/uL (ref 150–400)
RBC: 4.32 MIL/uL (ref 3.87–5.11)
RDW: 14.3 % (ref 11.5–15.5)
WBC: 11.6 K/uL — ABNORMAL HIGH (ref 4.0–10.5)

## 2012-06-03 LAB — BASIC METABOLIC PANEL WITH GFR
BUN: 11 mg/dL (ref 6–23)
CO2: 26 meq/L (ref 19–32)
Calcium: 8.9 mg/dL (ref 8.4–10.5)
Chloride: 100 meq/L (ref 96–112)
Creatinine, Ser: 0.66 mg/dL (ref 0.50–1.10)
GFR calc Af Amer: >90 mL/min
GFR calc non Af Amer: 80 mL/min — ABNORMAL LOW
Glucose, Bld: 114 mg/dL — ABNORMAL HIGH (ref 70–99)
Potassium: 3.8 meq/L (ref 3.5–5.1)
Sodium: 137 meq/L (ref 135–145)

## 2012-06-03 LAB — GLUCOSE, CAPILLARY
Glucose-Capillary: 109 mg/dL — ABNORMAL HIGH (ref 70–99)
Glucose-Capillary: 87 mg/dL (ref 70–99)

## 2012-06-03 NOTE — Discharge Summary (Signed)
Patient ID: [name] Short,  MRN: [id number], DOB/AGE: [birth year] 79 y.o.  Admit date: 05/30/2012 Discharge date: 06/03/2012  Primary Care Provider:  Primary Cardiologist:  Dr Allyson Sabal  Discharge Diagnoses  Principal Problem:  *Unstable angina  Active Problems:  CAD, CABG X 2 '93- cath this admission- no change from 2011, medical Rx  CHF (congestive heart failure) - mild, BNP 3K  Diabetes mellitus type II, controlled  Atrial fibrillation with controlled ventricular response  Dyslipidemia  PVD (peripheral vascular disease), AOBF '94  NSVT, EF 60-65% by 2D this admission, beta blocker increased    Procedures:  Coronary angiogram 06/02/12   Hospital Course:  79 y/o female followed by Dr Allyson Sabal. She has a history of ischemic heart disease and peripheral vascular occlusive disease along with hypertension, chronic A-fib not on coumadin due to fall risk, hyperlipidemia, diabetes, remote tobacco abuse having quit 20 years ago. She had coronary artery bypass grafting x2 by Dr. Particia Lather March 01, 1992, with an SVG to the RCA,  and an SVG to the circumflex. She has also had aortobifemoral bypass grafting by Dr. Betti Cruz on January 23, 1993. On December 02, 1998, she had patch angioplasty. On December 25, 2000, she had replacement of the left aortobifem bypass graft with revision of the fem-fem crossover graft, as well as repair of her profunda femoris by Dr. [name]. Dr. Allyson Sabal cath'd her Oct 06, 2009, revealing an occluded graft to the total RCA, left-to-right collaterals with normal LV function. She has a NL LM, LAD, and the CFX has a 30% OM stenosis.Her aortobifem was intact. Medical therapy was recommended.    Discharge Vitals:  Blood pressure 133/64, pulse 78, temperature 98 F (36.7 C), temperature source Oral, resp. rate 16, height 5' (1.524 m), weight 68.6 kg (151 lb 3.8 oz), SpO2 92.00%.    Labs: Results for orders placed during the hospital encounter of 05/30/12 (from the past 48 hour(s))    GLUCOSE, CAPILLARY     Status: Normal   Collection Time   06/01/12  8:49 PM      Component Value Range Comment   Glucose-Capillary 73  70 - 99 mg/dL   HEPARIN LEVEL (UNFRACTIONATED)     Status: Normal   Collection Time   06/01/12  9:38 PM      Component Value Range Comment   Heparin Unfractionated 0.36  0.30 - 0.70 IU/mL   BASIC METABOLIC PANEL     Status: Abnormal   Collection Time   06/02/12  5:55 AM      Component Value Range Comment   Sodium 141  135 - 145 mEq/L    Potassium 4.7  3.5 - 5.1 mEq/L HEMOLYSIS AT THIS LEVEL MAY AFFECT RESULT   Chloride 102  96 - 112 mEq/L    CO2 27  19 - 32 mEq/L    Glucose, Bld 99  70 - 99 mg/dL    BUN 11  6 - 23 mg/dL    Creatinine, Ser 7.82  0.50 - 1.10 mg/dL    Calcium 9.0  8.4 - 95.6 mg/dL    GFR calc non Af Amer 79 (*) >90 mL/min    GFR calc Af Amer >90  >90 mL/min   HEPARIN LEVEL (UNFRACTIONATED)     Status: Normal   Collection Time   06/02/12  5:55 AM      Component Value Range Comment   Heparin Unfractionated 0.36  0.30 - 0.70 IU/mL   CBC     Status: Normal   Collection  Time   06/02/12  5:55 AM      Component Value Range Comment   WBC 9.4  4.0 - 10.5 K/uL    RBC 4.11  3.87 - 5.11 MIL/uL    Hemoglobin 12.2  12.0 - 15.0 g/dL    HCT 16.1  09.6 - 04.5 %    MCV 89.3  78.0 - 100.0 fL    MCH 29.7  26.0 - 34.0 pg    MCHC 33.2  30.0 - 36.0 g/dL    RDW 40.9  81.1 - 91.4 %    Platelets 189  150 - 400 K/uL   GLUCOSE, CAPILLARY     Status: Normal   Collection Time   06/02/12  6:01 AM      Component Value Range Comment   Glucose-Capillary 84  70 - 99 mg/dL   GLUCOSE, CAPILLARY     Status: Normal   Collection Time   06/02/12 11:18 AM      Component Value Range Comment   Glucose-Capillary 90  70 - 99 mg/dL   GLUCOSE, CAPILLARY     Status: Normal   Collection Time   06/02/12  3:48 PM      Component Value Range Comment   Glucose-Capillary 79  70 - 99 mg/dL   URINALYSIS, ROUTINE W REFLEX MICROSCOPIC     Status: Abnormal   Collection Time    06/02/12  9:17 PM      Component Value Range Comment   Color, Urine YELLOW  YELLOW    APPearance CLOUDY (*) CLEAR    Specific Gravity, Urine 1.024  1.005 - 1.030    pH 8.0  5.0 - 8.0    Glucose, UA NEGATIVE  NEGATIVE mg/dL    Hgb urine dipstick NEGATIVE  NEGATIVE    Bilirubin Urine NEGATIVE  NEGATIVE    Ketones, ur NEGATIVE  NEGATIVE mg/dL    Protein, ur NEGATIVE  NEGATIVE mg/dL    Urobilinogen, UA 1.0  0.0 - 1.0 mg/dL    Nitrite NEGATIVE  NEGATIVE    Leukocytes, UA TRACE (*) NEGATIVE   URINE MICROSCOPIC-ADD ON     Status: Normal   Collection Time   06/02/12  9:17 PM      Component Value Range Comment   Squamous Epithelial / LPF RARE  RARE    WBC, UA 3-6  <3 WBC/hpf    RBC / HPF 0-2  <3 RBC/hpf    Bacteria, UA RARE  RARE   GLUCOSE, CAPILLARY     Status: Abnormal   Collection Time   06/02/12 10:03 PM      Component Value Range Comment   Glucose-Capillary 117 (*) 70 - 99 mg/dL   BASIC METABOLIC PANEL     Status: Abnormal   Collection Time   06/03/12  4:45 AM      Component Value Range Comment   Sodium 137  135 - 145 mEq/L    Potassium 3.8  3.5 - 5.1 mEq/L DELTA CHECK NOTED   Chloride 100  96 - 112 mEq/L    CO2 26  19 - 32 mEq/L    Glucose, Bld 114 (*) 70 - 99 mg/dL    BUN 11  6 - 23 mg/dL    Creatinine, Ser 7.82  0.50 - 1.10 mg/dL    Calcium 8.9  8.4 - 95.6 mg/dL    GFR calc non Af Amer 80 (*) >90 mL/min    GFR calc Af Amer >90  >90 mL/min   CBC  Status: Abnormal   Collection Time   06/03/12  4:45 AM      Component Value Range Comment   WBC 11.6 (*) 4.0 - 10.5 K/uL    RBC 4.32  3.87 - 5.11 MIL/uL    Hemoglobin 12.6  12.0 - 15.0 g/dL    HCT 09.8  11.9 - 14.7 %    MCV 89.6  78.0 - 100.0 fL    MCH 29.2  26.0 - 34.0 pg    MCHC 32.6  30.0 - 36.0 g/dL    RDW 82.9  56.2 - 13.0 %    Platelets 204  150 - 400 K/uL   GLUCOSE, CAPILLARY     Status: Abnormal   Collection Time   06/03/12  8:00 AM      Component Value Range Comment   Glucose-Capillary 109 (*) 70 - 99 mg/dL     GLUCOSE, CAPILLARY     Status: Normal   Collection Time   06/03/12 12:18 PM      Component Value Range Comment   Glucose-Capillary 87  70 - 99 mg/dL     Disposition:  Follow-up Information    Follow up with HAGER, BRYAN, PA. On 06/11/2012. (2pm)    Contact information:   [address] [place] [address] [phone]          Discharge Medications:    Medication List     As of 06/03/2012  5:02 PM    TAKE these medications         acetaminophen 325 MG tablet   Commonly known as: TYLENOL   Take 650 mg by mouth every 4 (four) hours as needed. For pain      aspirin 81 MG tablet   Take 81 mg by mouth daily.      atorvastatin 40 MG tablet   Commonly known as: LIPITOR   Take 40 mg by mouth daily.      cholecalciferol 1000 UNITS tablet   Commonly known as: VITAMIN D   Take 1,000 Units by mouth daily.      clopidogrel 75 MG tablet   Commonly known as: PLAVIX   Take 75 mg by mouth daily.      diazepam 5 MG tablet   Commonly known as: VALIUM   Take 5 mg by mouth 2 (two) times daily.      diltiazem 240 MG 24 hr capsule   Commonly known as: TIAZAC   Take 240 mg by mouth daily.      diphenhydrAMINE 12.5 MG/5ML elixir   Commonly known as: BENADRYL   Take 12.5 mg by mouth as needed. For allergic reaction to norco      docusate sodium 100 MG capsule   Commonly known as: COLACE   Take 100 mg by mouth at bedtime.      donepezil 5 MG tablet   Commonly known as: ARICEPT   Take 5 mg by mouth daily.      glimepiride 2 MG tablet   Commonly known as: AMARYL   Take 2 mg by mouth daily before breakfast.      HYDROcodone-acetaminophen 5-325 MG per tablet   Commonly known as: NORCO/VICODIN   Take 1 tablet by mouth every 6 (six) hours as needed. For pain      isosorbide mononitrate 60 MG 24 hr tablet   Commonly known as: IMDUR   Take 60 mg by mouth daily.      lansoprazole 30 MG capsule   Commonly known  as: PREVACID   Take 30 mg by mouth 2  (two) times daily.      levothyroxine 88 MCG tablet   Commonly known as: SYNTHROID, LEVOTHROID   Take 88 mcg by mouth daily.      losartan 100 MG tablet   Commonly known as: COZAAR   Take 100 mg by mouth daily.      metoprolol tartrate 25 MG tablet   Commonly known as: LOPRESSOR   Take 25 mg by mouth 2 (two) times daily.      multivitamin with minerals Tabs   Take 1 tablet by mouth daily.      nitroGLYCERIN 0.4 MG SL tablet   Commonly known as: NITROSTAT   Place 0.4 mg under the tongue every 5 (five) minutes as needed. For chest pain      omega-3 acid ethyl esters 1 G capsule   Commonly known as: LOVAZA   Take 2 g by mouth daily.      PARoxetine 20 MG tablet   Commonly known as: PAXIL   Take 20 mg by mouth every morning.      polyethylene glycol packet   Commonly known as: MIRALAX / GLYCOLAX   Take 17 g by mouth daily as needed. For constipation         Duration of Discharge Encounter: Greater than 30 minutes including physician time.  Jolene Provost PA-C 06/03/2012 5:02 PM

## 2012-06-03 NOTE — Progress Notes (Signed)
Discharged home accompanied by daughter, stable, discharge instructions and belongings with pt.

## 2012-06-03 NOTE — Care Management Note (Signed)
    Page 1 of 1   06/03/2012     10:15:24 AM   CARE MANAGEMENT NOTE 06/03/2012  Patient:  [facility]   Account Number:  [id number]  Date Initiated:  06/03/2012  Documentation initiated by:  Junius Creamer  Subjective/Objective Assessment:   adm w chf     Action/Plan:   lives at [facility], sw ref made   Anticipated DC Date:     Anticipated DC Plan:    In-house referral  Clinical Social Worker      DC Associate Professor  CM consult      Choice offered to / List presented to:             Status of service:   Medicare Important Message given?   (If response is "NO", the following Medicare IM given date fields will be blank) Date Medicare IM given:   Date Additional Medicare IM given:    Discharge Disposition:    Per UR Regulation:  Reviewed for med. necessity/level of care/duration of stay  If discussed at [place] Length of Stay Meetings, dates discussed:    Comments:  1/21 0845 debbie Treniya Lobb rn,bsn spoke w [facility]. they have on site rn and phy ther if needed. will cont to follow and assist as needed.

## 2012-06-03 NOTE — Progress Notes (Signed)
Subjective:  No Cp/SOB  Objective:  Temp:  [97.6 F (36.4 C)-98.9 F (37.2 C)] 98.9 F (37.2 C) (01/21 0800) Pulse Rate:  [67-100] 85  (01/21 0800) Resp:  [12-23] 18  (01/21 0800) BP: (99-163)/(44-85) 125/44 mmHg (01/21 0800) SpO2:  [89 %-97 %] 95 % (01/21 0800) Weight:  [68.6 kg (151 lb 3.8 oz)] 68.6 kg (151 lb 3.8 oz) (01/21 0500) Weight change: -0.1 kg (-3.5 oz)  Intake/Output from previous day: 01/20 0701 - 01/21 0700 In: 1060 [P.O.:610; I.V.:450] Out: 1605 [Urine:1605]  Intake/Output from this shift:    Physical Exam: General appearance: alert, cooperative and no distress Neck: no adenopathy, no carotid bruit, no JVD, supple, symmetrical, trachea midline and thyroid not enlarged, symmetric, no tenderness/mass/nodules Lungs: clear to auscultation bilaterally Heart: irregularly irregular rhythm Extremities: Left groin OK  Lab Results: Results for orders placed during the hospital encounter of 05/30/12 (from the past 48 hour(s))  GLUCOSE, CAPILLARY     Status: Abnormal   Collection Time   06/01/12 11:20 AM      Component Value Range Comment   Glucose-Capillary 125 (*) 70 - 99 mg/dL    Comment 1 Notify RN     GLUCOSE, CAPILLARY     Status: Normal   Collection Time   06/01/12  4:01 PM      Component Value Range Comment   Glucose-Capillary 83  70 - 99 mg/dL   GLUCOSE, CAPILLARY     Status: Normal   Collection Time   06/01/12  8:49 PM      Component Value Range Comment   Glucose-Capillary 73  70 - 99 mg/dL   HEPARIN LEVEL (UNFRACTIONATED)     Status: Normal   Collection Time   06/01/12  9:38 PM      Component Value Range Comment   Heparin Unfractionated 0.36  0.30 - 0.70 IU/mL   BASIC METABOLIC PANEL     Status: Abnormal   Collection Time   06/02/12  5:55 AM      Component Value Range Comment   Sodium 141  135 - 145 mEq/L    Potassium 4.7  3.5 - 5.1 mEq/L HEMOLYSIS AT THIS LEVEL MAY AFFECT RESULT   Chloride 102  96 - 112 mEq/L    CO2 27  19 - 32 mEq/L    Glucose, Bld 99  70 - 99 mg/dL    BUN 11  6 - 23 mg/dL    Creatinine, Ser 1.61  0.50 - 1.10 mg/dL    Calcium 9.0  8.4 - 09.6 mg/dL    GFR calc non Af Amer 79 (*) >90 mL/min    GFR calc Af Amer >90  >90 mL/min   HEPARIN LEVEL (UNFRACTIONATED)     Status: Normal   Collection Time   06/02/12  5:55 AM      Component Value Range Comment   Heparin Unfractionated 0.36  0.30 - 0.70 IU/mL   CBC     Status: Normal   Collection Time   06/02/12  5:55 AM      Component Value Range Comment   WBC 9.4  4.0 - 10.5 K/uL    RBC 4.11  3.87 - 5.11 MIL/uL    Hemoglobin 12.2  12.0 - 15.0 g/dL    HCT 04.5  40.9 - 81.1 %    MCV 89.3  78.0 - 100.0 fL    MCH 29.7  26.0 - 34.0 pg    MCHC 33.2  30.0 - 36.0 g/dL    RDW  14.3  11.5 - 15.5 %    Platelets 189  150 - 400 K/uL   GLUCOSE, CAPILLARY     Status: Normal   Collection Time   06/02/12  6:01 AM      Component Value Range Comment   Glucose-Capillary 84  70 - 99 mg/dL   GLUCOSE, CAPILLARY     Status: Normal   Collection Time   06/02/12 11:18 AM      Component Value Range Comment   Glucose-Capillary 90  70 - 99 mg/dL   GLUCOSE, CAPILLARY     Status: Normal   Collection Time   06/02/12  3:48 PM      Component Value Range Comment   Glucose-Capillary 79  70 - 99 mg/dL   URINALYSIS, ROUTINE W REFLEX MICROSCOPIC     Status: Abnormal   Collection Time   06/02/12  9:17 PM      Component Value Range Comment   Color, Urine YELLOW  YELLOW    APPearance CLOUDY (*) CLEAR    Specific Gravity, Urine 1.024  1.005 - 1.030    pH 8.0  5.0 - 8.0    Glucose, UA NEGATIVE  NEGATIVE mg/dL    Hgb urine dipstick NEGATIVE  NEGATIVE    Bilirubin Urine NEGATIVE  NEGATIVE    Ketones, ur NEGATIVE  NEGATIVE mg/dL    Protein, ur NEGATIVE  NEGATIVE mg/dL    Urobilinogen, UA 1.0  0.0 - 1.0 mg/dL    Nitrite NEGATIVE  NEGATIVE    Leukocytes, UA TRACE (*) NEGATIVE   URINE MICROSCOPIC-ADD ON     Status: Normal   Collection Time   06/02/12  9:17 PM      Component Value Range Comment     Squamous Epithelial / LPF RARE  RARE    WBC, UA 3-6  <3 WBC/hpf    RBC / HPF 0-2  <3 RBC/hpf    Bacteria, UA RARE  RARE   GLUCOSE, CAPILLARY     Status: Abnormal   Collection Time   06/02/12 10:03 PM      Component Value Range Comment   Glucose-Capillary 117 (*) 70 - 99 mg/dL   BASIC METABOLIC PANEL     Status: Abnormal   Collection Time   06/03/12  4:45 AM      Component Value Range Comment   Sodium 137  135 - 145 mEq/L    Potassium 3.8  3.5 - 5.1 mEq/L DELTA CHECK NOTED   Chloride 100  96 - 112 mEq/L    CO2 26  19 - 32 mEq/L    Glucose, Bld 114 (*) 70 - 99 mg/dL    BUN 11  6 - 23 mg/dL    Creatinine, Ser 5.28  0.50 - 1.10 mg/dL    Calcium 8.9  8.4 - 41.3 mg/dL    GFR calc non Af Amer 80 (*) >90 mL/min    GFR calc Af Amer >90  >90 mL/min   CBC     Status: Abnormal   Collection Time   06/03/12  4:45 AM      Component Value Range Comment   WBC 11.6 (*) 4.0 - 10.5 K/uL    RBC 4.32  3.87 - 5.11 MIL/uL    Hemoglobin 12.6  12.0 - 15.0 g/dL    HCT 24.4  01.0 - 27.2 %    MCV 89.6  78.0 - 100.0 fL    MCH 29.2  26.0 - 34.0 pg    MCHC 32.6  30.0 -  36.0 g/dL    RDW 96.0  45.4 - 09.8 %    Platelets 204  150 - 400 K/uL     Imaging: Imaging results have been reviewed  Assessment/Plan:   1. Active Problems: 2.  CHF (congestive heart failure) 3.  Chest pain 4.  Cardiomegaly 5.  Diabetes mellitus type II, controlled 6.  Atrial fibrillation with controlled ventricular response 7.   Time Spent Directly with Patient:  20 minutes  Length of Stay:  LOS: 4 days   Stable CAD by cath yesterday (nl LV Fxn). Left groin OK. Labs OK. Exam benign. Afib with CVR. OK for D/C home this AM. ROV with a PA in 2-3 weeks and then with me 6-8 weeks.  Runell Gess 06/03/2012, 8:03 AM

## 2012-06-28 ENCOUNTER — Other Ambulatory Visit: Payer: Self-pay

## 2012-08-11 ENCOUNTER — Emergency Department (HOSPITAL_COMMUNITY): Payer: Medicare Other

## 2012-08-11 ENCOUNTER — Encounter (HOSPITAL_COMMUNITY): Payer: Self-pay

## 2012-08-11 ENCOUNTER — Observation Stay (HOSPITAL_COMMUNITY)
Admission: EM | Admit: 2012-08-11 | Discharge: 2012-08-12 | Disposition: A | Discharge status: Nursing Home (Includes ICF) | Payer: Medicare Other | Attending: Cardiovascular Disease | Admitting: Cardiovascular Disease

## 2012-08-11 DIAGNOSIS — T82897A Other specified complication of cardiac prosthetic devices, implants and grafts, initial encounter: Secondary | ICD-10-CM | POA: Insufficient documentation

## 2012-08-11 DIAGNOSIS — I2510 Atherosclerotic heart disease of native coronary artery without angina pectoris: Secondary | ICD-10-CM | POA: Diagnosis present

## 2012-08-11 DIAGNOSIS — I4891 Unspecified atrial fibrillation: Secondary | ICD-10-CM | POA: Insufficient documentation

## 2012-08-11 DIAGNOSIS — Y832 Surgical operation with anastomosis, bypass or graft as the cause of abnormal reaction of the patient, or of later complication, without mention of misadventure at the time of the procedure: Secondary | ICD-10-CM | POA: Insufficient documentation

## 2012-08-11 DIAGNOSIS — I10 Essential (primary) hypertension: Secondary | ICD-10-CM | POA: Insufficient documentation

## 2012-08-11 DIAGNOSIS — I4729 Other ventricular tachycardia: Secondary | ICD-10-CM | POA: Diagnosis present

## 2012-08-11 DIAGNOSIS — R072 Precordial pain: Principal | ICD-10-CM | POA: Insufficient documentation

## 2012-08-11 DIAGNOSIS — I472 Ventricular tachycardia: Secondary | ICD-10-CM | POA: Diagnosis present

## 2012-08-11 DIAGNOSIS — R079 Chest pain, unspecified: Secondary | ICD-10-CM

## 2012-08-11 DIAGNOSIS — I2582 Chronic total occlusion of coronary artery: Secondary | ICD-10-CM | POA: Insufficient documentation

## 2012-08-11 DIAGNOSIS — R0602 Shortness of breath: Secondary | ICD-10-CM | POA: Insufficient documentation

## 2012-08-11 DIAGNOSIS — Z951 Presence of aortocoronary bypass graft: Secondary | ICD-10-CM | POA: Insufficient documentation

## 2012-08-11 DIAGNOSIS — E119 Type 2 diabetes mellitus without complications: Secondary | ICD-10-CM | POA: Diagnosis present

## 2012-08-11 DIAGNOSIS — I482 Chronic atrial fibrillation, unspecified: Secondary | ICD-10-CM

## 2012-08-11 DIAGNOSIS — E785 Hyperlipidemia, unspecified: Secondary | ICD-10-CM | POA: Diagnosis present

## 2012-08-11 LAB — GLUCOSE, CAPILLARY: Glucose-Capillary: 113 mg/dL — ABNORMAL HIGH (ref 70–99)

## 2012-08-11 LAB — POCT I-STAT, CHEM 8
Calcium, Ion: 1.14 mmol/L (ref 1.13–1.30)
Chloride: 104 mEq/L (ref 96–112)
HCT: 39 % (ref 36.0–46.0)
Hemoglobin: 13.3 g/dL (ref 12.0–15.0)
Potassium: 3.6 mEq/L (ref 3.5–5.1)

## 2012-08-11 LAB — TROPONIN I: Troponin I: <0.3 ng/mL (ref <0.30)

## 2012-08-11 LAB — COMPREHENSIVE METABOLIC PANEL
BUN: 8 mg/dL (ref 6–23)
CO2: 29 mEq/L (ref 19–32)
Chloride: 106 mEq/L (ref 96–112)
Creatinine, Ser: 0.66 mg/dL (ref 0.50–1.10)
GFR calc non Af Amer: 80 mL/min — ABNORMAL LOW (ref >90)
Glucose, Bld: 90 mg/dL (ref 70–99)
Total Bilirubin: 0.6 mg/dL (ref 0.3–1.2)

## 2012-08-11 LAB — URINALYSIS, ROUTINE W REFLEX MICROSCOPIC
Ketones, ur: NEGATIVE mg/dL
Leukocytes, UA: NEGATIVE
Nitrite: NEGATIVE
Specific Gravity, Urine: 1.011 (ref 1.005–1.030)
Urobilinogen, UA: 0.2 mg/dL (ref 0.0–1.0)
pH: 7.5 (ref 5.0–8.0)

## 2012-08-11 LAB — CBC
MCH: 29.3 pg (ref 26.0–34.0)
Platelets: 194 10*3/uL (ref 150–400)
Platelets: 213 10*3/uL (ref 150–400)
RBC: 4.43 MIL/uL (ref 3.87–5.11)
RBC: 4.6 MIL/uL (ref 3.87–5.11)
RDW: 14.3 % (ref 11.5–15.5)
WBC: 6.1 10*3/uL (ref 4.0–10.5)
WBC: 8.3 10*3/uL (ref 4.0–10.5)

## 2012-08-11 LAB — CREATININE, SERUM
Creatinine, Ser: 0.6 mg/dL (ref 0.50–1.10)
GFR calc non Af Amer: 83 mL/min — ABNORMAL LOW (ref >90)

## 2012-08-11 LAB — POCT I-STAT TROPONIN I: Troponin i, poc: 0 ng/mL (ref 0.00–0.08)

## 2012-08-11 MED ORDER — METOPROLOL TARTRATE 25 MG PO TABS
25.0000 mg | ORAL_TABLET | Freq: Every day | ORAL | Status: DC
  Administered 2012-08-11: 25 mg via ORAL
  Filled 2012-08-11 (×2): qty 1

## 2012-08-11 MED ORDER — ASPIRIN 81 MG PO CHEW
81.0000 mg | CHEWABLE_TABLET | Freq: Every day | ORAL | Status: DC
  Administered 2012-08-12: 81 mg via ORAL
  Filled 2012-08-11: qty 1

## 2012-08-11 MED ORDER — ATORVASTATIN CALCIUM 40 MG PO TABS
40.0000 mg | ORAL_TABLET | Freq: Every day | ORAL | Status: DC
  Administered 2012-08-11: 40 mg via ORAL
  Filled 2012-08-11 (×2): qty 1

## 2012-08-11 MED ORDER — SODIUM CHLORIDE 0.9 % IJ SOLN
3.0000 mL | Freq: Two times a day (BID) | INTRAMUSCULAR | Status: DC
  Administered 2012-08-11: 3 mL via INTRAVENOUS

## 2012-08-11 MED ORDER — DILTIAZEM HCL ER BEADS 240 MG PO CP24
240.0000 mg | ORAL_CAPSULE | Freq: Every day | ORAL | Status: DC
  Administered 2012-08-12: 240 mg via ORAL
  Filled 2012-08-11: qty 1

## 2012-08-11 MED ORDER — DIAZEPAM 5 MG PO TABS
5.0000 mg | ORAL_TABLET | Freq: Two times a day (BID) | ORAL | Status: DC
Start: 2012-08-11 — End: 2012-08-12
  Administered 2012-08-11 – 2012-08-12 (×2): 5 mg via ORAL
  Filled 2012-08-11 (×2): qty 1

## 2012-08-11 MED ORDER — ACETAMINOPHEN 325 MG PO TABS: 650.0000 mg | ORAL_TABLET | ORAL | Status: DC | PRN

## 2012-08-11 MED ORDER — HEPARIN SODIUM (PORCINE) 5000 UNIT/ML IJ SOLN
5000.0000 [IU] | Freq: Three times a day (TID) | INTRAMUSCULAR | Status: DC
  Administered 2012-08-11 – 2012-08-12 (×2): 5000 [IU] via SUBCUTANEOUS
  Filled 2012-08-11 (×5): qty 1

## 2012-08-11 MED ORDER — RANOLAZINE ER 500 MG PO TB12
500.0000 mg | ORAL_TABLET | Freq: Two times a day (BID) | ORAL | Status: DC
  Administered 2012-08-11 – 2012-08-12 (×2): 500 mg via ORAL
  Filled 2012-08-11 (×3): qty 1

## 2012-08-11 MED ORDER — PANTOPRAZOLE SODIUM 20 MG PO TBEC
20.0000 mg | DELAYED_RELEASE_TABLET | Freq: Every day | ORAL | Status: DC
  Administered 2012-08-12: 20 mg via ORAL
  Filled 2012-08-11: qty 1

## 2012-08-11 MED ORDER — OMEGA-3-ACID ETHYL ESTERS 1 G PO CAPS
2.0000 g | ORAL_CAPSULE | Freq: Every day | ORAL | Status: DC
  Administered 2012-08-12: 2 g via ORAL
  Filled 2012-08-11: qty 2

## 2012-08-11 MED ORDER — LOSARTAN POTASSIUM 50 MG PO TABS
100.0000 mg | ORAL_TABLET | Freq: Every day | ORAL | Status: DC
  Administered 2012-08-12: 100 mg via ORAL
  Filled 2012-08-11: qty 2

## 2012-08-11 MED ORDER — ISOSORBIDE MONONITRATE ER 60 MG PO TB24
60.0000 mg | ORAL_TABLET | Freq: Every day | ORAL | Status: DC
  Administered 2012-08-12: 60 mg via ORAL
  Filled 2012-08-11: qty 1

## 2012-08-11 MED ORDER — ISOSORBIDE MONONITRATE ER 30 MG PO TB24
30.0000 mg | ORAL_TABLET | Freq: Every day | ORAL | Status: DC
  Administered 2012-08-11: 30 mg via ORAL
  Filled 2012-08-11 (×2): qty 1

## 2012-08-11 MED ORDER — PAROXETINE HCL 20 MG PO TABS
20.0000 mg | ORAL_TABLET | Freq: Every morning | ORAL | Status: DC
  Administered 2012-08-12: 20 mg via ORAL
  Filled 2012-08-11: qty 1

## 2012-08-11 MED ORDER — DONEPEZIL HCL 5 MG PO TABS
5.0000 mg | ORAL_TABLET | Freq: Every day | ORAL | Status: DC
  Administered 2012-08-12: 5 mg via ORAL
  Filled 2012-08-11: qty 1

## 2012-08-11 MED ORDER — HYDROCODONE-ACETAMINOPHEN 5-325 MG PO TABS
1.0000 | ORAL_TABLET | Freq: Four times a day (QID) | ORAL | Status: DC | PRN
Start: 2012-08-11 — End: 2012-08-12

## 2012-08-11 MED ORDER — LEVOTHYROXINE SODIUM 88 MCG PO TABS
88.0000 ug | ORAL_TABLET | Freq: Every day | ORAL | Status: DC
  Administered 2012-08-12: 88 ug via ORAL
  Filled 2012-08-11 (×2): qty 1

## 2012-08-11 MED ORDER — VITAMIN D3 25 MCG (1000 UNIT) PO TABS
1000.0000 [IU] | ORAL_TABLET | Freq: Every day | ORAL | Status: DC
  Administered 2012-08-12: 1000 [IU] via ORAL
  Filled 2012-08-11: qty 1

## 2012-08-11 MED ORDER — NITROGLYCERIN 0.4 MG SL SUBL: 0.4000 mg | SUBLINGUAL_TABLET | SUBLINGUAL | Status: DC | PRN

## 2012-08-11 MED ORDER — CLOPIDOGREL BISULFATE 75 MG PO TABS
75.0000 mg | ORAL_TABLET | Freq: Every day | ORAL | Status: DC
  Administered 2012-08-12: 75 mg via ORAL

## 2012-08-11 MED ORDER — MELATONIN 3 MG PO TABS: 3.0000 mg | ORAL_TABLET | Freq: Every day | ORAL | Status: DC

## 2012-08-11 MED ORDER — GUAIFENESIN 100 MG/5ML PO SOLN
10.0000 mL | ORAL | Status: DC
  Administered 2012-08-12: 200 mg via ORAL
  Filled 2012-08-11 (×7): qty 10

## 2012-08-11 MED ORDER — DOCUSATE SODIUM 100 MG PO CAPS
100.0000 mg | ORAL_CAPSULE | Freq: Every day | ORAL | Status: DC
  Administered 2012-08-11: 100 mg via ORAL
  Filled 2012-08-11 (×2): qty 1

## 2012-08-11 MED ORDER — METOPROLOL TARTRATE 50 MG PO TABS
50.0000 mg | ORAL_TABLET | Freq: Every day | ORAL | Status: DC
  Administered 2012-08-12: 50 mg via ORAL
  Filled 2012-08-11: qty 1

## 2012-08-11 MED ORDER — GLIMEPIRIDE 2 MG PO TABS
2.0000 mg | ORAL_TABLET | Freq: Every day | ORAL | Status: DC
Start: 2012-08-12 — End: 2012-08-12
  Administered 2012-08-12: 2 mg via ORAL
  Filled 2012-08-11 (×2): qty 1

## 2012-08-11 MED ORDER — POLYETHYLENE GLYCOL 3350 17 G PO PACK
17.0000 g | PACK | Freq: Every day | ORAL | Status: DC | PRN
  Filled 2012-08-11: qty 1

## 2012-08-11 NOTE — ED Notes (Signed)
Pt lives at [facility].

## 2012-08-11 NOTE — Progress Notes (Signed)
Received patient from ED.  Pt alert and oriented x4.  Pt sitting up eating a sandwich.

## 2012-08-11 NOTE — Consult Note (Signed)
Reason for Consult: Chest Pain Referring Physician: Irving Physician   HPI: The patient is an 79 y.o. female, followed by Dr. Allyson Sabal, who resides at [facility]. She has a history of CABG x 2 in 1993. She had a vein graft to the RCA and a vein graft to the circumflex. These have subsequently occluded. The patient has good left-to-right collaterals. She was cathed in 2011 and recently admitted in January of this year with chest pain and cathed again by Dr. Allyson Sabal. There was no change since cath in 2011. The LAD was patent. There circumflex was patent. There were left-right collaterals to a total RCA and her LV function was normal with an EF of 60-65%. Continued medical therapy was recommend and she was discharged back to the senior living center. She also has chronic a-fib, that is rate controlled (on ASA and Plavix for stroke prophylaxis), DM and DLD. She reports that she has experienced increased fatigue x 2 weeks, along with a sensation of substernal chest heaviness, that is worse in the am and gradually improves throughout the day. On occasion, she has felt short of breath and has been mildly diaphoretic during periods of chest discomfort. The discomfort is not like her angina in the past, which was more pain along with belching. At it's worse, it has been 7/10. It is non-radiating, non-positional and non-pleuritic. She asked for SL NTG today at the living center and her discomfort improved after 2 doses. Her nurse there insisted that she come to the ED for further evaluation. She reports that she is now comfortable and chest pain free. She denies recent palpations, orthopnea,PND, syncope/presyncope n/v, fever, chills, hematochezia, melana or hematuria. She, on occasion, feels lightheaded if she sits up too fast. She reports compliance with all of her home medications.    Past Medical History  Diagnosis Date  . Diabetes mellitus without complication   . Hypertension   . Coronary artery  disease   . Thyroid disease   . Depression   . Memory loss   . Skin cancer   . PVD (peripheral vascular disease)     Past Surgical History  Procedure Laterality Date  . Coronary artery bypass graft    . Abdominal hysterectomy    . Joint replacement    . Cardiac catheterization  2012  . Coronary angioplasty    . Aorto- femoral bipass  2011    Family History  Problem Relation Age of Onset  . Heart disease Father   . Kidney failure Brother     Social History:  reports that she quit smoking about 21 years ago. She has never used smokeless tobacco. She reports that she does not drink alcohol or use illicit drugs.  Allergies:  Allergies  Allergen Reactions  . Codeine Itching    Medications: Prior to Admission:  (Not in a hospital admission)  Results for orders placed during the hospital encounter of 08/11/12 (from the past 48 hour(s))  COMPREHENSIVE METABOLIC PANEL     Status: Abnormal   Collection Time    08/11/12 12:01 PM      Result Value Range   Sodium 143  135 - 145 mEq/L   Potassium 3.6  3.5 - 5.1 mEq/L   Chloride 106  96 - 112 mEq/L   CO2 29  19 - 32 mEq/L   Glucose, Bld 90  70 - 99 mg/dL   BUN 8  6 - 23 mg/dL   Creatinine, Ser 1.61  0.50 - 1.10  mg/dL   Calcium 9.0  8.4 - 16.1 mg/dL   Total Protein 6.3  6.0 - 8.3 g/dL   Albumin 3.5  3.5 - 5.2 g/dL   AST 31  0 - 37 U/L   ALT 27  0 - 35 U/L   Alkaline Phosphatase 138 (*) 39 - 117 U/L   Total Bilirubin 0.6  0.3 - 1.2 mg/dL   GFR calc non Af Amer 80 (*) >90 mL/min   GFR calc Af Amer >90  >90 mL/min   Comment:            The eGFR has been calculated     using the CKD EPI equation.     This calculation has not been     validated in all clinical     situations.     eGFR's persistently     <90 mL/min signify     possible Chronic Kidney Disease.  CBC     Status: None   Collection Time    08/11/12 12:01 PM      Result Value Range   WBC 6.1  4.0 - 10.5 K/uL   RBC 4.43  3.87 - 5.11 MIL/uL   Hemoglobin 12.7   12.0 - 15.0 g/dL   HCT 09.6  04.5 - 40.9 %   MCV 86.0  78.0 - 100.0 fL   MCH 28.7  26.0 - 34.0 pg   MCHC 33.3  30.0 - 36.0 g/dL   RDW 81.1  91.4 - 78.2 %   Platelets 194  150 - 400 K/uL  POCT I-STAT TROPONIN I     Status: None   Collection Time    08/11/12 12:39 PM      Result Value Range   Troponin i, poc 0.00  0.00 - 0.08 ng/mL   Comment 3            Comment: Due to the release kinetics of cTnI,     a negative result within the first hours     of the onset of symptoms does not rule out     myocardial infarction with certainty.     If myocardial infarction is still suspected,     repeat the test at appropriate intervals.  POCT I-STAT, CHEM 8     Status: None   Collection Time    08/11/12 12:41 PM      Result Value Range   Sodium 144  135 - 145 mEq/L   Potassium 3.6  3.5 - 5.1 mEq/L   Chloride 104  96 - 112 mEq/L   BUN 7  6 - 23 mg/dL   Creatinine, Ser 9.56  0.50 - 1.10 mg/dL   Glucose, Bld 89  70 - 99 mg/dL   Calcium, Ion 2.13  0.86 - 1.30 mmol/L   TCO2 27  0 - 100 mmol/L   Hemoglobin 13.3  12.0 - 15.0 g/dL   HCT 57.8  46.9 - 62.9 %  URINALYSIS, ROUTINE W REFLEX MICROSCOPIC     Status: None   Collection Time    08/11/12 12:47 PM      Result Value Range   Color, Urine YELLOW  YELLOW   APPearance CLEAR  CLEAR   Specific Gravity, Urine 1.011  1.005 - 1.030   pH 7.5  5.0 - 8.0   Glucose, UA NEGATIVE  NEGATIVE mg/dL   Hgb urine dipstick NEGATIVE  NEGATIVE   Bilirubin Urine NEGATIVE  NEGATIVE   Ketones, ur NEGATIVE  NEGATIVE mg/dL   Protein, ur  NEGATIVE  NEGATIVE mg/dL   Urobilinogen, UA 0.2  0.0 - 1.0 mg/dL   Nitrite NEGATIVE  NEGATIVE   Leukocytes, UA NEGATIVE  NEGATIVE   Comment: MICROSCOPIC NOT DONE ON URINES WITH NEGATIVE PROTEIN, BLOOD, LEUKOCYTES, NITRITE, OR GLUCOSE <1000 mg/dL.    Dg Chest 2 View  08/11/2012  *RADIOLOGY REPORT*  Clinical Data: Chest pain.  CHEST - 2 VIEW  Comparison: 05/30/2012.  Findings: Trachea is midline.  Heart is enlarged, stable.  Lungs  are clear.  No pleural fluid.  IMPRESSION: No acute findings.   Original Report Authenticated By: Leanna Battles, M.D.     Review of Systems  Constitutional: Positive for malaise/fatigue and diaphoresis. Negative for fever and chills.  HENT: Negative for sore throat.   Respiratory: Positive for cough and shortness of breath. Negative for sputum production and wheezing.   Cardiovascular: Positive for chest pain. Negative for palpitations, orthopnea, claudication, leg swelling and PND.  Gastrointestinal: Negative for nausea, vomiting, abdominal pain, blood in stool and melena.  Genitourinary: Negative for hematuria.  Neurological: Positive for dizziness. Negative for loss of consciousness and weakness.   Blood pressure 147/73, pulse 73, temperature 98 F (36.7 C), temperature source Oral, SpO2 95.00%. Physical Exam  Constitutional: She is oriented to person, place, and time. She appears well-developed and well-nourished. No distress.  HENT:  Head: Normocephalic and atraumatic.  Eyes: Conjunctivae and EOM are normal. Pupils are equal, round, and reactive to light.  Neck: No hepatojugular reflux and no JVD present. Carotid bruit is not present. No tracheal deviation present. No thyromegaly present.  Cardiovascular: Normal rate and intact distal pulses.  An irregularly irregular rhythm present. Exam reveals no gallop and no friction rub.   No murmur heard. Pulses:      Radial pulses are 2+ on the right side, and 2+ on the left side.       Dorsalis pedis pulses are 2+ on the right side, and 2+ on the left side.  Respiratory: Effort normal and breath sounds normal. No respiratory distress. She has no wheezes. She has no rales. She exhibits no tenderness.  GI: Soft. Bowel sounds are normal. She exhibits no distension and no mass. There is no tenderness.  Musculoskeletal: She exhibits no edema.  Lymphadenopathy:    She has no cervical adenopathy.  Neurological: She is alert and oriented to  person, place, and time.  Skin: Skin is warm and dry. She is not diaphoretic.  Psychiatric: She has a normal mood and affect. Her behavior is normal.    Assessment/Plan: Principal Problem:   Chest pain Active Problems:   Diabetes mellitus type II, controlled   Dyslipidemia   NSVT, EF 60-65% by 2D this admission, beta blocker increased   CAD, CABG X 2 '93- Last cath 05/2012- no change from 2011, medical Rx- EF of 60-65% via echo 05/2012   Chronic atrial fibrillation- ventricular rate is controlled  Plan: Initial EKG shows a-fib with controlled ventricular rate. Her HR on telemetry has remained in the 50's-60's. Her EKG shows no acute changes. POC troponin is negative. Will continue to cycle. CXR is normal. She was recently cathed by Dr. Allyson Sabal in Jan. of this year, which showed unchanged anatomy compared to last cath 3 years ago. The left coronary system was essentially free of significant disease and she had totally occluded RCA with left to right collaterals. EF was 60-65%. No WMA. She is currently CP free. She is on good medical therapy: 75 mg of Lopressor daily, 100 mg of  Cozaar, 60 mg of Imdur, along with ASA, Plavix and a statin. May consider increasing Imdur to 90 mg daily. Her BP is stable.  If troponin negative x 2, can likely discharge back to [facility]. MD to follow with further recommendation.    Allayne Butcher, PA-C 08/11/2012, 3:02 PM     Patient seen and examined. Agree with assessment and plan. Very pleasant 79 yo WF with permanent AF and documented occlusion of grafts supplying RCA and LCX with collaterals.. She has experienced almost daily cp in early morning before taking her AM Imdur and has also been on metoprolol, ARB, Ca channel. No acute ECG changes on ECG today. Will keep overnight for observation, add an extra Imdur 30 mg at bedtime and initiate Ranexa at 500 mg bid. Will assess benefit of medication change in am and probably DC tomorrow.   Lennette Bihari, MD,  [employer] 08/11/2012 4:17 PM

## 2012-08-11 NOTE — ED Provider Notes (Addendum)
History     CSN: [id number]  Arrival date & time 08/11/12  1137   First MD Initiated Contact with Patient 08/11/12 1201      Chief Complaint  Patient presents with  . Chest Pain    (Consider location/radiation/quality/duration/timing/severity/associated sxs/prior treatment) Patient is a 79 y.o. female presenting with chest pain. The history is provided by the patient.  Chest Pain  patient here complaining of substernal chest pain x2 weeks that is worse in the morning. Describes it as a pressure that is nonradiating and associated with dyspnea diaphoresis. It last for approximately one hour and then subsides spontaneously. Today her symptoms were worse than she told the nursing home staff and MS was called and she was given nitroglycerin which reduced her pain to 0. Does have a history of coronary artery disease. She is currently pain-free. Denies any recent leg pain or swelling. No recent fever or cough.  Past Medical History  Diagnosis Date  . Diabetes mellitus without complication   . Hypertension   . Coronary artery disease   . Thyroid disease   . Depression   . Memory loss   . Skin cancer   . PVD (peripheral vascular disease)     Past Surgical History  Procedure Laterality Date  . Coronary artery bypass graft    . Abdominal hysterectomy    . Joint replacement    . Cardiac catheterization  2012  . Coronary angioplasty    . Aorto- femoral bipass  2011    Family History  Problem Relation Age of Onset  . Heart disease Father   . Kidney failure Brother     History  Substance Use Topics  . Smoking status: Former Smoker    Quit date: 05/15/1991  . Smokeless tobacco: Never Used  . Alcohol Use: No    OB History   Grav Para Term Preterm Abortions TAB SAB Ect Mult Living                  Review of Systems  Cardiovascular: Positive for chest pain.  All other systems reviewed and are negative.    Allergies  Codeine  Home Medications   Current Outpatient  Rx  Name  Route  Sig  Dispense  Refill  . acetaminophen (TYLENOL) 325 MG tablet   Oral   Take 650 mg by mouth every 4 (four) hours as needed. For pain         . aspirin 81 MG tablet   Oral   Take 81 mg by mouth daily.         Marland Kitchen atorvastatin (LIPITOR) 40 MG tablet   Oral   Take 40 mg by mouth daily.         . cholecalciferol (VITAMIN D) 1000 UNITS tablet   Oral   Take 1,000 Units by mouth daily.         . clopidogrel (PLAVIX) 75 MG tablet   Oral   Take 75 mg by mouth daily.         . diazepam (VALIUM) 5 MG tablet   Oral   Take 5 mg by mouth 2 (two) times daily.         Marland Kitchen diltiazem (TIAZAC) 240 MG 24 hr capsule   Oral   Take 240 mg by mouth daily.         . diphenhydrAMINE (BENADRYL) 12.5 MG/5ML elixir   Oral   Take 12.5 mg by mouth as needed. For allergic reaction to norco         .  docusate sodium (COLACE) 100 MG capsule   Oral   Take 100 mg by mouth at bedtime.         . donepezil (ARICEPT) 5 MG tablet   Oral   Take 5 mg by mouth daily.         Marland Kitchen glimepiride (AMARYL) 2 MG tablet   Oral   Take 2 mg by mouth daily before breakfast.         . HYDROcodone-acetaminophen (NORCO/VICODIN) 5-325 MG per tablet   Oral   Take 1 tablet by mouth every 6 (six) hours as needed. For pain         . isosorbide mononitrate (IMDUR) 60 MG 24 hr tablet   Oral   Take 60 mg by mouth daily.         . lansoprazole (PREVACID) 30 MG capsule   Oral   Take 30 mg by mouth 2 (two) times daily.         Marland Kitchen levothyroxine (SYNTHROID, LEVOTHROID) 88 MCG tablet   Oral   Take 88 mcg by mouth daily.         Marland Kitchen losartan (COZAAR) 100 MG tablet   Oral   Take 100 mg by mouth daily.         . metoprolol tartrate (LOPRESSOR) 25 MG tablet   Oral   Take 25 mg by mouth 2 (two) times daily.         . Multiple Vitamin (MULTIVITAMIN WITH MINERALS) TABS   Oral   Take 1 tablet by mouth daily.         . nitroGLYCERIN (NITROSTAT) 0.4 MG SL tablet   Sublingual    Place 0.4 mg under the tongue every 5 (five) minutes as needed. For chest pain         . omega-3 acid ethyl esters (LOVAZA) 1 G capsule   Oral   Take 2 g by mouth daily.         Marland Kitchen PARoxetine (PAXIL) 20 MG tablet   Oral   Take 20 mg by mouth every morning.         . polyethylene glycol (MIRALAX / GLYCOLAX) packet   Oral   Take 17 g by mouth daily as needed. For constipation           BP 147/73  Pulse 73  Temp(Src) 98 F (36.7 C) (Oral)  SpO2 95%  Physical Exam  Nursing note and vitals reviewed. Constitutional: She is oriented to person, place, and time. She appears well-developed and well-nourished.  Non-toxic appearance. No distress.  HENT:  Head: Normocephalic and atraumatic.  Eyes: Conjunctivae, EOM and lids are normal. Pupils are equal, round, and reactive to light.  Neck: Normal range of motion. Neck supple. No tracheal deviation present. No mass present.  Cardiovascular: Normal rate and normal heart sounds.  An irregular rhythm present. Exam reveals no gallop.   No murmur heard. Pulmonary/Chest: Effort normal and breath sounds normal. No stridor. No respiratory distress. She has no decreased breath sounds. She has no wheezes. She has no rhonchi. She has no rales.  Abdominal: Soft. Normal appearance and bowel sounds are normal. She exhibits no distension. There is no tenderness. There is no rebound and no CVA tenderness.  Musculoskeletal: Normal range of motion. She exhibits no edema and no tenderness.  Neurological: She is alert and oriented to person, place, and time. She has normal strength. No cranial nerve deficit or sensory deficit. GCS eye subscore is 4. GCS verbal subscore is 5.  GCS motor subscore is 6.  Skin: Skin is warm and dry. No abrasion and no rash noted.  Psychiatric: She has a normal mood and affect. Her speech is normal and behavior is normal.    ED Course  Procedures (including critical care time)  Labs Reviewed  COMPREHENSIVE METABOLIC PANEL   CBC  URINALYSIS, ROUTINE W REFLEX MICROSCOPIC   No results found.   No diagnosis found.    MDM   Date: 08/11/2012  Rate: 78  Rhythm: atrial fibrillation  QRS Axis: normal  Intervals: normal  ST/T Wave abnormalities: nonspecific ST changes  Conduction Disutrbances:none  Narrative Interpretation:   Old EKG Reviewed: unchanged  4:35 PM Patient to be admitted by cardiology for observation        Toy Baker, MD 08/11/12 1635  Toy Baker, MD 08/13/12 1715

## 2012-08-11 NOTE — ED Notes (Signed)
Pt called EMS because of Chest pressure. Pt has a hx of A-Fib. Pt took ASA and 1 NGT that changed CP from10/10 to 8/10 . EMS gave 4 more NGT that changed the pain from 8/10 to 0/10 . Pt reported on arrival to ED she felt better and wanted to go home.

## 2012-08-12 LAB — CBC
MCH: 28.8 pg (ref 26.0–34.0)
MCHC: 33.7 g/dL (ref 30.0–36.0)
Platelets: 187 10*3/uL (ref 150–400)
RBC: 4.23 MIL/uL (ref 3.87–5.11)

## 2012-08-12 LAB — BASIC METABOLIC PANEL
Calcium: 8.8 mg/dL (ref 8.4–10.5)
GFR calc Af Amer: >90 mL/min (ref >90)
GFR calc non Af Amer: 78 mL/min — ABNORMAL LOW (ref >90)
Glucose, Bld: 68 mg/dL — ABNORMAL LOW (ref 70–99)
Sodium: 140 mEq/L (ref 135–145)

## 2012-08-12 MED ORDER — ISOSORBIDE MONONITRATE ER 30 MG PO TB24: 30.0000 mg | ORAL_TABLET | Freq: Every day | ORAL | Status: DC

## 2012-08-12 MED ORDER — RANOLAZINE ER 500 MG PO TB12: 500.0000 mg | ORAL_TABLET | Freq: Two times a day (BID) | ORAL | Status: AC

## 2012-08-12 NOTE — Progress Notes (Signed)
[facility]  Subjective: Feeling much better. She feels that the medication changes have im  Objective: Vital signs in last 24 hours: Temp:  [97.5 F (36.4 C)-98 F (36.7 C)] 97.8 F (36.6 C) (04/01 0500) Pulse Rate:  [37-74] 68 (04/01 0500) Resp:  [13-24] 18 (04/01 0500) BP: (118-148)/(52-80) 139/61 mmHg (04/01 0500) SpO2:  [89 %-98 %] 98 % (04/01 0500) Weight:  [143 lb 4.8 oz (65 kg)] 143 lb 4.8 oz (65 kg) (04/01 0500) Last BM Date: 08/11/12  Intake/Output from previous day: 03/31 0701 - 04/01 0700 In: 3 [I.V.:3] Out: -  Intake/Output this shift:    Medications Current Facility-Administered Medications  Medication Dose Route Frequency Provider Last Rate Last Dose  . acetaminophen (TYLENOL) tablet 650 mg  650 mg Oral Q4H PRN Brittainy Simmons, PA-C      . aspirin chewable tablet 81 mg  81 mg Oral Daily Brittainy Simmons, PA-C      . atorvastatin (LIPITOR) tablet 40 mg  40 mg Oral Q2000 Brittainy Simmons, PA-C   40 mg at 08/11/12 2052  . cholecalciferol (VITAMIN D) tablet 1,000 Units  1,000 Units Oral Daily Brittainy Simmons, PA-C      . clopidogrel (PLAVIX) tablet 75 mg  75 mg Oral Q breakfast Brittainy Simmons, PA-C      . diazepam (VALIUM) tablet 5 mg  5 mg Oral BID Brittainy Simmons, PA-C   5 mg at 08/11/12 2252  . diltiazem (TIAZAC) 24 hr capsule 240 mg  240 mg Oral Daily Brittainy Simmons, PA-C      . docusate sodium (COLACE) capsule 100 mg  100 mg Oral QHS Brittainy Simmons, PA-C   100 mg at 08/11/12 2252  . donepezil (ARICEPT) tablet 5 mg  5 mg Oral Daily Brittainy Simmons, PA-C      . glimepiride (AMARYL) tablet 2 mg  2 mg Oral QAC breakfast Brittainy Simmons, PA-C      . guaiFENesin (ROBITUSSIN) 100 MG/5ML solution 200 mg  10 mL Oral Q4H while awake Brittainy Simmons, PA-C      . heparin injection 5,000 Units  5,000 Units Subcutaneous Q8H Brittainy Simmons, PA-C   5,000 Units at 08/12/12 1610  . HYDROcodone-acetaminophen  (NORCO/VICODIN) 5-325 MG per tablet 1 tablet  1 tablet Oral Q6H PRN Brittainy Simmons, PA-C      . isosorbide mononitrate (IMDUR) 24 hr tablet 30 mg  30 mg Oral Daily Brittainy Simmons, PA-C   30 mg at 08/11/12 2252  . isosorbide mononitrate (IMDUR) 24 hr tablet 60 mg  60 mg Oral Daily Brittainy Simmons, PA-C      . levothyroxine (SYNTHROID, LEVOTHROID) tablet 88 mcg  88 mcg Oral QAC breakfast Brittainy Simmons, PA-C   88 mcg at 08/12/12 0644  . losartan (COZAAR) tablet 100 mg  100 mg Oral Daily Brittainy Simmons, PA-C      . metoprolol (LOPRESSOR) tablet 50 mg  50 mg Oral Daily Brittainy Simmons, PA-C      . metoprolol tartrate (LOPRESSOR) tablet 25 mg  25 mg Oral Q2000 Brittainy Simmons, PA-C   25 mg at 08/11/12 2052  . nitroGLYCERIN (NITROSTAT) SL tablet 0.4 mg  0.4 mg Sublingual Q5 min PRN Brittainy Simmons, PA-C      . omega-3 acid ethyl esters (LOVAZA) capsule 2 g  2 g Oral Daily Brittainy Simmons, PA-C      . pantoprazole (PROTONIX) EC tablet 20 mg  20 mg Oral Q1200 Brittainy Simmons, PA-C      . PARoxetine (PAXIL)  tablet 20 mg  20 mg Oral q morning - 10a Brittainy Simmons, PA-C      . polyethylene glycol (MIRALAX / GLYCOLAX) packet 17 g  17 g Oral Daily PRN Brittainy Simmons, PA-C      . ranolazine (RANEXA) 12 hr tablet 500 mg  500 mg Oral BID Brittainy Simmons, PA-C   500 mg at 08/11/12 2252  . sodium chloride 0.9 % injection 3 mL  3 mL Intravenous Q12H Brittainy Simmons, PA-C   3 mL at 08/11/12 2254    PE: General appearance: alert, cooperative and no distress Lungs: clear to auscultation bilaterally Heart: irregularly irregular rhythm Extremities: no LEE Pulses: 2+ and symmetric Skin: warm and dry Neurologic: Grossly normal  Lab Results:   Recent Labs  08/11/12 1201 08/11/12 1241 08/11/12 1836 08/12/12 0246  WBC 6.1  --  8.3 7.1  HGB 12.7 13.3 13.5 12.2  HCT 38.1 39.0 39.7 36.2  PLT 194  --  213 187   BMET  Recent Labs  08/11/12 1201 08/11/12 1241 08/11/12 1836  08/12/12 0246  NA 143 144  --  140  K 3.6 3.6  --  3.5  CL 106 104  --  104  CO2 29  --   --  28  GLUCOSE 90 89  --  68*  BUN 8 7  --  11  CREATININE 0.66 0.60 0.60 0.72  CALCIUM 9.0  --   --  8.8   Cardiac Enzymes Cardiac Panel (last 3 results)  Recent Labs  08/11/12 1515 08/11/12 2040 08/12/12 0245  TROPONINI <0.30 <0.30 <0.30    Assessment/Plan   Principal Problem:   Chest pain Active Problems:   Diabetes mellitus type II, controlled   Dyslipidemia   NSVT, EF 60-65% by 2D this admission, beta blocker increased   CAD, CABG X 2 '93- Last cath 05/2012- no change from 2011, medical Rx- EF of 60-65% via echo 05/2012   Chronic atrial fibrillation- ventricular rate is controlled  Plan: Admitted yesterday for observation for angina and medication adjustments. No EKG changes. Cardiac enzymes negative x 3. Imdur was increased to 90 mg daily (60 mg in am and 30 mg in pm). Ranexa 500 mg BID also added. She feels better this am, with less chest pressure. Plan for discharge back to [facility] nursing with new medication changes.     LOS: 1 day    Brittainy M. Delmer Islam 08/12/2012 8:11 AM  Agree with note written by Boyce Medici  [facility]  Admitted with chest heaviness. Pt well known to me with [place] H/O IHD s/p remote CABG X 2 1993. Cath in 2011 and 05/30/12 showed occluded SVGs. Total RCA with L to R collat and nl LV fxn. Med Rx recommended. Pt ruled out for MI. EKG w/o acute changes. Exam benign. Meds adjusted. OK for D/C home today back to SNF. TCM 10 in our office with MLP then back to see me.   Runell Gess 08/12/2012 8:45 AM

## 2012-08-12 NOTE — Discharge Summary (Signed)
Physician Discharge Summary  Patient ID: [name] Short MRN: [id number] DOB/AGE: 1928-05-10 79 y.o.  Admit date: 08/11/2012 Discharge date: 08/12/2012  Admission Diagnoses: Chest Pain  Discharge Diagnoses:  Principal Problem:   Chest pain Active Problems:   Diabetes mellitus type II, controlled   Dyslipidemia   NSVT, EF 60-65% by 2D this admission, beta blocker increased   CAD, CABG X 2 '93- Last cath 05/2012- no change from 2011, medical Rx- EF of 60-65% via echo 05/2012   Chronic atrial fibrillation- ventricular rate is controlled   Discharged Condition: stable  Hospital Course: The patient is an 79 y.o. female who was admitted to [facility] on 08/11/12 for observation for chest pain.  She has been followed at [facility] by Dr. Allyson Sabal. She is a current resident at [facility]. She has a history of CAD, chronic atrial fibrillation that is rate controlled (on ASA and Plavix for stroke prophylaxis), DM, and DLD. She had CABG x 2 in 1993. She had a vein graft to the RCA and a vein graft to the circumflex. She underwent coronary catheterization in 2011, that revealed both grafts to be occluded, but she had good left-to-right collaterals. She was admitted in January of this year with chest pain and underwent another cath, by Dr. Allyson Sabal. The results were unchanged from prior cath in 2011 and it was decided to treat medically. At that time, she was discharged on 75 mg of Lopressor daily, 240 mg of Diltiazem, 60 mg of Imdur, 100 mg of Cozarr, along with ASA, Plavix and a statin. She had been compliant with her medications. She returned to the [facility] ED with complaints of fatigue and substernal chest heaviness x 2 weeks. She ruled out for MI with negative cardiac enzymes x 3. EKG showed no acute changes. She was evaluated in the ED by Dr. Tresa Endo. It was decided to adjust medications and to keep overnight for observation. Her Imdur was increased to 90 mg daily (60 mg in the AM and 30 mg in the PM). 500 mg of  Ranexa BID was also added. She tolerated the medication adjustments well. She endorsed improvement in symptoms. She was last seen and examined by Dr. Allyson Sabal, who felt she was stable for discharge back to [facility]. She will follow-up with Wilburt Finlay, PA-C at [facility] on 08/18/12.   Consults: None  Significant Diagnostic Studies: None  Treatments: See Hospital Course  Discharge Exam: Blood pressure 139/61, pulse 68, temperature 97.8 F (36.6 C), temperature source Oral, resp. rate 18, height 5' (1.524 m), weight 143 lb 4.8 oz (65 kg), SpO2 98.00%.   Disposition: 01-Home or Self Care      Discharge Orders   Future Orders Complete By Expires     Diet - low sodium heart healthy  As directed     Increase activity slowly  As directed         Medication List    TAKE these medications       acetaminophen 325 MG tablet  Commonly known as:  TYLENOL  Take 650 mg by mouth every 4 (four) hours as needed. For pain     aspirin 81 MG chewable tablet  Chew 81 mg by mouth daily.     atorvastatin 40 MG tablet  Commonly known as:  LIPITOR  Take 40 mg by mouth daily.     cholecalciferol 1000 UNITS tablet  Commonly known as:  VITAMIN D  Take 1,000 Units by mouth daily.     clopidogrel 75  MG tablet  Commonly known as:  PLAVIX  Take 75 mg by mouth daily.     diazepam 5 MG tablet  Commonly known as:  VALIUM  Take 5 mg by mouth 2 (two) times daily.     diltiazem 240 MG 24 hr capsule  Commonly known as:  TIAZAC  Take 240 mg by mouth daily.     diphenhydrAMINE 12.5 MG/5ML elixir  Commonly known as:  BENADRYL  Take 12.5 mg by mouth as needed. For allergic reaction to norco     docusate sodium 100 MG capsule  Commonly known as:  COLACE  Take 100 mg by mouth at bedtime.     donepezil 5 MG tablet  Commonly known as:  ARICEPT  Take 5 mg by mouth daily.     glimepiride 2 MG tablet  Commonly known as:  AMARYL  Take 2 mg by mouth daily before breakfast.     guaiFENesin 100  MG/5ML Soln  Commonly known as:  ROBITUSSIN  Take 10 mLs by mouth every 4 (four) hours while awake.     HYDROcodone-acetaminophen 5-325 MG per tablet  Commonly known as:  NORCO/VICODIN  Take 1 tablet by mouth every 6 (six) hours as needed. For pain     isosorbide mononitrate 30 MG 24 hr tablet  Commonly known as:  IMDUR  Take 1 tablet (30 mg total) by mouth daily. Take at night     isosorbide mononitrate 60 MG 24 hr tablet  Commonly known as:  IMDUR  Take 60 mg by mouth daily.     lansoprazole 30 MG capsule  Commonly known as:  PREVACID  Take 30 mg by mouth 2 (two) times daily.     levothyroxine 88 MCG tablet  Commonly known as:  SYNTHROID, LEVOTHROID  Take 88 mcg by mouth daily.     losartan 100 MG tablet  Commonly known as:  COZAAR  Take 100 mg by mouth daily.     Melatonin 3 MG Tabs  Take 3 mg by mouth at bedtime.     metoprolol tartrate 25 MG tablet  Commonly known as:  LOPRESSOR  Take 25 mg by mouth daily at 8 pm.     metoprolol 50 MG tablet  Commonly known as:  LOPRESSOR  Take 50 mg by mouth daily. Daily at 0800     multivitamin with minerals Tabs  Take 1 tablet by mouth daily.     nitroGLYCERIN 0.4 MG SL tablet  Commonly known as:  NITROSTAT  Place 0.4 mg under the tongue every 5 (five) minutes as needed. For chest pain     omega-3 acid ethyl esters 1 G capsule  Commonly known as:  LOVAZA  Take 2 g by mouth daily.     PARoxetine 20 MG tablet  Commonly known as:  PAXIL  Take 20 mg by mouth every morning.     polyethylene glycol packet  Commonly known as:  MIRALAX / GLYCOLAX  Take 17 g by mouth daily as needed. For constipation     ranolazine 500 MG 12 hr tablet  Commonly known as:  RANEXA  Take 1 tablet (500 mg total) by mouth 2 (two) times daily.       Follow-up Information   Follow up with HAGER, BRYAN, PA-C On 08/18/2012. (10:20 am)    Contact information:   [address] [place] [address] [phone]     TIME  SPENT ON DISCHARGE, INCLUDING PHYSICIAN TIME: 30 MINUTES  Signed: Avon Gully  M. Vanessia Bokhari 08/12/2012, 10:26 AM

## 2012-08-12 NOTE — Progress Notes (Signed)
UR Completed Trenee Igoe Graves-Bigelow, RN,BSN [phone]  

## 2012-09-16 ENCOUNTER — Emergency Department (HOSPITAL_BASED_OUTPATIENT_CLINIC_OR_DEPARTMENT_OTHER)
Admission: EM | Admit: 2012-09-16 | Discharge: 2012-09-16 | Disposition: A | Discharge status: Skilled Nursing Facility | Payer: Medicare Other | Attending: Emergency Medicine | Admitting: Emergency Medicine

## 2012-09-16 ENCOUNTER — Emergency Department (HOSPITAL_BASED_OUTPATIENT_CLINIC_OR_DEPARTMENT_OTHER): Payer: Medicare Other

## 2012-09-16 ENCOUNTER — Encounter (HOSPITAL_BASED_OUTPATIENT_CLINIC_OR_DEPARTMENT_OTHER): Payer: Self-pay

## 2012-09-16 DIAGNOSIS — Z7902 Long term (current) use of antithrombotics/antiplatelets: Secondary | ICD-10-CM | POA: Insufficient documentation

## 2012-09-16 DIAGNOSIS — F329 Major depressive disorder, single episode, unspecified: Secondary | ICD-10-CM | POA: Insufficient documentation

## 2012-09-16 DIAGNOSIS — Z85828 Personal history of other malignant neoplasm of skin: Secondary | ICD-10-CM | POA: Insufficient documentation

## 2012-09-16 DIAGNOSIS — Y9210 Unspecified residential institution as the place of occurrence of the external cause: Secondary | ICD-10-CM | POA: Insufficient documentation

## 2012-09-16 DIAGNOSIS — F3289 Other specified depressive episodes: Secondary | ICD-10-CM | POA: Insufficient documentation

## 2012-09-16 DIAGNOSIS — R413 Other amnesia: Secondary | ICD-10-CM | POA: Insufficient documentation

## 2012-09-16 DIAGNOSIS — Z951 Presence of aortocoronary bypass graft: Secondary | ICD-10-CM | POA: Insufficient documentation

## 2012-09-16 DIAGNOSIS — I2510 Atherosclerotic heart disease of native coronary artery without angina pectoris: Secondary | ICD-10-CM | POA: Insufficient documentation

## 2012-09-16 DIAGNOSIS — R29898 Other symptoms and signs involving the musculoskeletal system: Secondary | ICD-10-CM

## 2012-09-16 DIAGNOSIS — Z8669 Personal history of other diseases of the nervous system and sense organs: Secondary | ICD-10-CM | POA: Insufficient documentation

## 2012-09-16 DIAGNOSIS — Y939 Activity, unspecified: Secondary | ICD-10-CM | POA: Insufficient documentation

## 2012-09-16 DIAGNOSIS — Z79899 Other long term (current) drug therapy: Secondary | ICD-10-CM | POA: Insufficient documentation

## 2012-09-16 DIAGNOSIS — E119 Type 2 diabetes mellitus without complications: Secondary | ICD-10-CM | POA: Insufficient documentation

## 2012-09-16 DIAGNOSIS — Z87891 Personal history of nicotine dependence: Secondary | ICD-10-CM | POA: Insufficient documentation

## 2012-09-16 DIAGNOSIS — R296 Repeated falls: Secondary | ICD-10-CM | POA: Insufficient documentation

## 2012-09-16 DIAGNOSIS — Z7982 Long term (current) use of aspirin: Secondary | ICD-10-CM | POA: Insufficient documentation

## 2012-09-16 DIAGNOSIS — I10 Essential (primary) hypertension: Secondary | ICD-10-CM | POA: Insufficient documentation

## 2012-09-16 DIAGNOSIS — S7000XA Contusion of unspecified hip, initial encounter: Secondary | ICD-10-CM | POA: Insufficient documentation

## 2012-09-16 DIAGNOSIS — Z9861 Coronary angioplasty status: Secondary | ICD-10-CM | POA: Insufficient documentation

## 2012-09-16 DIAGNOSIS — M6281 Muscle weakness (generalized): Secondary | ICD-10-CM | POA: Insufficient documentation

## 2012-09-16 DIAGNOSIS — S7002XA Contusion of left hip, initial encounter: Secondary | ICD-10-CM

## 2012-09-16 DIAGNOSIS — E079 Disorder of thyroid, unspecified: Secondary | ICD-10-CM | POA: Insufficient documentation

## 2012-09-16 LAB — CBC WITH DIFFERENTIAL/PLATELET
Basophils Absolute: 0 10*3/uL (ref 0.0–0.1)
Eosinophils Relative: 1 % (ref 0–5)
Lymphocytes Relative: 13 % (ref 12–46)
Lymphs Abs: 1.4 10*3/uL (ref 0.7–4.0)
MCV: 86.6 fL (ref 78.0–100.0)
Neutro Abs: 8.4 10*3/uL — ABNORMAL HIGH (ref 1.7–7.7)
Neutrophils Relative %: 77 % (ref 43–77)
Platelets: 191 10*3/uL (ref 150–400)
RBC: 4.76 MIL/uL (ref 3.87–5.11)
RDW: 14.6 % (ref 11.5–15.5)
WBC: 10.9 10*3/uL — ABNORMAL HIGH (ref 4.0–10.5)

## 2012-09-16 LAB — COMPREHENSIVE METABOLIC PANEL
ALT: 20 U/L (ref 0–35)
AST: 21 U/L (ref 0–37)
Alkaline Phosphatase: 124 U/L — ABNORMAL HIGH (ref 39–117)
CO2: 26 mEq/L (ref 19–32)
Calcium: 9.5 mg/dL (ref 8.4–10.5)
GFR calc non Af Amer: 67 mL/min — ABNORMAL LOW (ref >90)
Potassium: 4.2 mEq/L (ref 3.5–5.1)
Sodium: 141 mEq/L (ref 135–145)

## 2012-09-16 LAB — URINALYSIS, ROUTINE W REFLEX MICROSCOPIC
Bilirubin Urine: NEGATIVE
Hgb urine dipstick: NEGATIVE
Ketones, ur: NEGATIVE mg/dL
Specific Gravity, Urine: 1.005 (ref 1.005–1.030)
Urobilinogen, UA: 0.2 mg/dL (ref 0.0–1.0)
pH: 6.5 (ref 5.0–8.0)

## 2012-09-16 LAB — URINE MICROSCOPIC-ADD ON

## 2012-09-16 NOTE — ED Provider Notes (Signed)
History     CSN: [id number]  Arrival date & time 09/16/12  1541   First MD Initiated Contact with Patient 09/16/12 1725      Chief Complaint  Patient presents with  . Fall    (Consider location/radiation/quality/duration/timing/severity/associated sxs/prior treatment) Patient is a 79 y.o. female presenting with hip pain. The history is provided by the patient. No language interpreter was used.  Hip Pain This is a new problem. The current episode started today. The problem occurs constantly. The problem has been gradually worsening. Pertinent negatives include no joint swelling. Nothing aggravates the symptoms. She has tried nothing for the symptoms. The treatment provided moderate relief.  Pt reports she has had cramping in her legs recently and legs feel weak.   Pt reports legs feel like they are giving away.   Pt fell today.   Pt report she sat down on ground hard.   Pt complains of pain in her right hip.    Past Medical History  Diagnosis Date  . Diabetes mellitus without complication   . Hypertension   . Coronary artery disease   . Thyroid disease   . Depression   . Memory loss   . Skin cancer   . PVD (peripheral vascular disease)     Past Surgical History  Procedure Laterality Date  . Coronary artery bypass graft    . Abdominal hysterectomy    . Joint replacement    . Cardiac catheterization  2012  . Coronary angioplasty    . Aorto- femoral bipass  2011    Family History  Problem Relation Age of Onset  . Heart disease Father   . Kidney failure Brother     History  Substance Use Topics  . Smoking status: Former Smoker    Quit date: 05/15/1991  . Smokeless tobacco: Never Used  . Alcohol Use: No    OB History   Grav Para Term Preterm Abortions TAB SAB Ect Mult Living                  Review of Systems  Musculoskeletal: Negative for joint swelling.  All other systems reviewed and are negative.    Allergies  Codeine  Home Medications   Current  Outpatient Rx  Name  Route  Sig  Dispense  Refill  . acetaminophen (TYLENOL) 325 MG tablet   Oral   Take 650 mg by mouth every 4 (four) hours as needed. For pain         . aspirin 81 MG chewable tablet   Oral   Chew 81 mg by mouth daily.         Marland Kitchen atorvastatin (LIPITOR) 40 MG tablet   Oral   Take 40 mg by mouth daily.         . cholecalciferol (VITAMIN D) 1000 UNITS tablet   Oral   Take 1,000 Units by mouth daily.         . clopidogrel (PLAVIX) 75 MG tablet   Oral   Take 75 mg by mouth daily.         . diazepam (VALIUM) 5 MG tablet   Oral   Take 5 mg by mouth 2 (two) times daily.         Marland Kitchen diltiazem (TIAZAC) 240 MG 24 hr capsule   Oral   Take 240 mg by mouth daily.         . diphenhydrAMINE (BENADRYL) 12.5 MG/5ML elixir   Oral   Take 12.5 mg by mouth  as needed. For allergic reaction to norco         . docusate sodium (COLACE) 100 MG capsule   Oral   Take 100 mg by mouth at bedtime.         . donepezil (ARICEPT) 5 MG tablet   Oral   Take 5 mg by mouth daily.         Marland Kitchen glimepiride (AMARYL) 2 MG tablet   Oral   Take 2 mg by mouth daily before breakfast.         . guaiFENesin (ROBITUSSIN) 100 MG/5ML SOLN   Oral   Take 10 mLs by mouth every 4 (four) hours while awake.         Marland Kitchen HYDROcodone-acetaminophen (NORCO/VICODIN) 5-325 MG per tablet   Oral   Take 1 tablet by mouth every 6 (six) hours as needed. For pain         . isosorbide mononitrate (IMDUR) 30 MG 24 hr tablet   Oral   Take 1 tablet (30 mg total) by mouth daily. Take at night   30 tablet   5     Take at night   . isosorbide mononitrate (IMDUR) 60 MG 24 hr tablet   Oral   Take 60 mg by mouth daily.         . lansoprazole (PREVACID) 30 MG capsule   Oral   Take 30 mg by mouth 2 (two) times daily.         Marland Kitchen levothyroxine (SYNTHROID, LEVOTHROID) 88 MCG tablet   Oral   Take 88 mcg by mouth daily.         Marland Kitchen losartan (COZAAR) 100 MG tablet   Oral   Take 100 mg by  mouth daily.         . Melatonin 3 MG TABS   Oral   Take 3 mg by mouth at bedtime.         . metoprolol (LOPRESSOR) 50 MG tablet   Oral   Take 50 mg by mouth daily. Daily at 0800         . metoprolol tartrate (LOPRESSOR) 25 MG tablet   Oral   Take 25 mg by mouth daily at 8 pm.          . Multiple Vitamin (MULTIVITAMIN WITH MINERALS) TABS   Oral   Take 1 tablet by mouth daily.         . nitroGLYCERIN (NITROSTAT) 0.4 MG SL tablet   Sublingual   Place 0.4 mg under the tongue every 5 (five) minutes as needed. For chest pain         . omega-3 acid ethyl esters (LOVAZA) 1 G capsule   Oral   Take 2 g by mouth daily.         Marland Kitchen PARoxetine (PAXIL) 20 MG tablet   Oral   Take 20 mg by mouth every morning.         . polyethylene glycol (MIRALAX / GLYCOLAX) packet   Oral   Take 17 g by mouth daily as needed. For constipation         . ranolazine (RANEXA) 500 MG 12 hr tablet   Oral   Take 1 tablet (500 mg total) by mouth 2 (two) times daily.   60 tablet   5     BP 140/74  Pulse 62  Temp(Src) 97.8 F (36.6 C) (Oral)  Resp 18  SpO2 98%  Physical Exam  Nursing note and vitals reviewed. Constitutional: She  is oriented to person, place, and time. She appears well-developed and well-nourished.  HENT:  Head: Normocephalic and atraumatic.  Right Ear: External ear normal.  Left Ear: External ear normal.  Mouth/Throat: Oropharynx is clear and moist.  Eyes: Conjunctivae are normal. Pupils are equal, round, and reactive to light.  Neck: Normal range of motion. Neck supple.  Cardiovascular: Normal rate and regular rhythm.   Pulmonary/Chest: Effort normal and breath sounds normal.  Abdominal: Soft. Bowel sounds are normal.  Musculoskeletal: Normal range of motion. She exhibits tenderness.  Tender hip  left  Neurological: She is alert and oriented to person, place, and time. She has normal reflexes.  Skin: Skin is warm and dry.  Psychiatric: She has a normal mood  and affect.    ED Course  Procedures (including critical care time)  Labs Reviewed  CBC WITH DIFFERENTIAL - Abnormal; Notable for the following:    WBC 10.9 (*)    Neutro Abs 8.4 (*)    All other components within normal limits  COMPREHENSIVE METABOLIC PANEL - Abnormal; Notable for the following:    Alkaline Phosphatase 124 (*)    GFR calc non Af Amer 67 (*)    GFR calc Af Amer 77 (*)    All other components within normal limits  URINALYSIS, ROUTINE W REFLEX MICROSCOPIC - Abnormal; Notable for the following:    Protein, ur 30 (*)    All other components within normal limits  URINE MICROSCOPIC-ADD ON   Dg Hip Complete Left  09/16/2012  *RADIOLOGY REPORT*  Clinical Data: Pain post fall  LEFT HIP - COMPLETE 2+ VIEW  Comparison:  03/04/2012  Findings: Vascular clips overlie both common femoral regions. Patchy femoral arterial calcifications.  Small spurs about both hips.  Negative for fracture, dislocation, or other acute bony abnormality.  Mild facet degenerative changes in the visualized lower lumbar spine.  IMPRESSION:  1.  Mild degenerative changes without fracture or other acute abnormality.   Original Report Authenticated By: D. Andria Rhein, MD      1. Contusion of left hip, initial encounter   2. Weakness of both legs       MDM  Labs obtained and reviewed,   Xray no fracture.   Pt able to ambulate without difficulty.   Pt advised to follow up with her primary for evaluation.        Elson Areas, PA-C 09/16/12 2118  Lonia Skinner [place], PA-C 09/16/12 2119

## 2012-09-16 NOTE — ED Provider Notes (Signed)
Medical screening examination/treatment/procedure(s) were conducted as a shared visit with non-physician practitioner(s) and myself.  I personally evaluated the patient during the encounter.  This 79 year old female has chronic low back pain chronic bilateral hip pain chronic pain laying down both legs 24 hours a day for several months of gradually worsening without trauma the last 2 weeks but then today due to the pain she fell and hurt her left hip but she is able to walk at baseline after falling and hurting her today, she has a history of peripheral less of these with multiple stents in her legs but no sudden color change and no numbness to her legs no new weakness to her legs both legs always feels generally weak without sudden or new focal or lateralizing weakness she is no change in bowel or bladder function no chest pain shortness breath abdominal pain or lightheadedness examination has mild diffuse tenderness across her lumbar region posterior pelvic region and both hips with both feet pink warm and dry with capillary refill less than 2 seconds in bilateral dorsalis pedis pulses intact with normal light touch and good movement of both legs not suggestive of acute ischemia or cauda equina syndrome.  Hurman Horn, MD 09/17/12 2158

## 2012-09-16 NOTE — ED Notes (Signed)
MD at bedside. 

## 2012-09-16 NOTE — ED Notes (Signed)
Patient from [facility].

## 2012-09-16 NOTE — ED Notes (Signed)
[facility] report-pt from [facility] she was outside and her legs became weak and she sat self on the ground-pain to buttocks-no LOC,head or neck pain

## 2012-09-23 ENCOUNTER — Encounter (INDEPENDENT_AMBULATORY_CARE_PROVIDER_SITE_OTHER): Payer: Medicare Other | Admitting: *Deleted

## 2012-09-23 ENCOUNTER — Encounter: Payer: Self-pay | Admitting: Vascular Surgery

## 2012-09-23 ENCOUNTER — Telehealth: Payer: Self-pay | Admitting: *Deleted

## 2012-09-23 ENCOUNTER — Ambulatory Visit (INDEPENDENT_AMBULATORY_CARE_PROVIDER_SITE_OTHER): Payer: Medicare Other | Admitting: Vascular Surgery

## 2012-09-23 ENCOUNTER — Other Ambulatory Visit: Payer: Self-pay | Admitting: *Deleted

## 2012-09-23 VITALS — BP 112/63 | HR 81 | Ht 60.0 in | Wt 137.7 lb

## 2012-09-23 DIAGNOSIS — T82868D Thrombosis of vascular prosthetic devices, implants and grafts, subsequent encounter: Secondary | ICD-10-CM

## 2012-09-23 DIAGNOSIS — I739 Peripheral vascular disease, unspecified: Secondary | ICD-10-CM

## 2012-09-23 DIAGNOSIS — I70219 Atherosclerosis of native arteries of extremities with intermittent claudication, unspecified extremity: Secondary | ICD-10-CM

## 2012-09-23 DIAGNOSIS — I7092 Chronic total occlusion of artery of the extremities: Secondary | ICD-10-CM

## 2012-09-23 DIAGNOSIS — I70229 Atherosclerosis of native arteries of extremities with rest pain, unspecified extremity: Secondary | ICD-10-CM

## 2012-09-23 DIAGNOSIS — Z48812 Encounter for surgical aftercare following surgery on the circulatory system: Secondary | ICD-10-CM

## 2012-09-23 DIAGNOSIS — T82598A Other mechanical complication of other cardiac and vascular devices and implants, initial encounter: Secondary | ICD-10-CM

## 2012-09-23 DIAGNOSIS — T82898D Other specified complication of vascular prosthetic devices, implants and grafts, subsequent encounter: Secondary | ICD-10-CM

## 2012-09-23 DIAGNOSIS — I743 Embolism and thrombosis of arteries of the lower extremities: Secondary | ICD-10-CM

## 2012-09-23 NOTE — Telephone Encounter (Signed)
[name] Short called to say her mother had gone to [facility] on Thursday 09/18/12 and was admitted. She was discharged Sat 5/10 and told to make an appt here within 3 day. Her mother is unable to walk very far because of leg pain;not so much pain while resting. She is taking Vicodin for the pain.According to her daughter her legs are cold and very pale. 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 I discussed this with Dr [name] and we are bringing her to the office today.

## 2012-09-23 NOTE — Progress Notes (Signed)
Subjective:     Patient ID: [name] Short, female   DOB: April 02, 1928, 79 y.o.   MRN: [id number]  HPI this 79 year old female patient is well-known to me. She is status post aortobifemoral bypass graft by me in 1994. She has subsequently had multiple revisions with a left to right femoral-femoral graft and most recently in 2011  she had repair of a severe stenosis of the distal left limb of the aorto bifemoral graft proximal to the femoral-femoral graft. Her ABIs at that time roughly 0.9 bilaterally. Last week she developed acute onset of severe weakness in both lower extremities. She has been having severe claudication in both legs over the last several weeks she states that this was much more severe. On Thursday she was taken [place] and admitted with bradycardia and pale lower extremities. The bradycardia was drug-induced and resolved after adjustment of her beta blockers. Area of her lower extremities improved. ABIs were roughly 0.30 bilaterally. She currently denies rest pain or history of nonhealing ulcers or infection but continues to have very severe claudication unable to walk any distance without a walker.  Past Medical History  Diagnosis Date  . Diabetes mellitus without complication   . Hypertension   . Coronary artery disease   . Thyroid disease   . Depression   . Memory loss   . Skin cancer   . PVD (peripheral vascular disease)   . CHF (congestive heart failure)   . Atrial fibrillation   . Myocardial infarction     History  Substance Use Topics  . Smoking status: Former Smoker    Quit date: 05/15/1991  . Smokeless tobacco: Never Used  . Alcohol Use: No    Family History  Problem Relation Age of Onset  . Heart disease Father   . Hypertension Father   . Heart attack Father   . Kidney failure Brother   . Hyperlipidemia Daughter     Allergies  Allergen Reactions  . Codeine Itching    Current outpatient prescriptions:aspirin 81 MG chewable tablet, Chew  81 mg by mouth daily., Disp: , Rfl: ;  atorvastatin (LIPITOR) 40 MG tablet, Take 40 mg by mouth daily., Disp: , Rfl: ;  cholecalciferol (VITAMIN D) 1000 UNITS tablet, Take 1,000 Units by mouth daily., Disp: , Rfl: ;  clopidogrel (PLAVIX) 75 MG tablet, Take 75 mg by mouth daily., Disp: , Rfl: ;  diazepam (VALIUM) 5 MG tablet, Take 5 mg by mouth 2 (two) times daily., Disp: , Rfl:  diphenhydrAMINE (BENADRYL) 12.5 MG/5ML elixir, Take 12.5 mg by mouth as needed. For allergic reaction to norco, Disp: , Rfl: ;  donepezil (ARICEPT) 5 MG tablet, Take 5 mg by mouth daily., Disp: , Rfl: ;  glimepiride (AMARYL) 2 MG tablet, Take 2 mg by mouth daily before breakfast., Disp: , Rfl: ;  HYDROcodone-acetaminophen (NORCO/VICODIN) 5-325 MG per tablet, Take 1 tablet by mouth every 6 (six) hours as needed. For pain, Disp: , Rfl:  isosorbide mononitrate (IMDUR) 30 MG 24 hr tablet, Take 1 tablet (30 mg total) by mouth daily. Take at night, Disp: 30 tablet, Rfl: 5;  lansoprazole (PREVACID) 30 MG capsule, Take 30 mg by mouth 2 (two) times daily., Disp: , Rfl: ;  levothyroxine (SYNTHROID, LEVOTHROID) 88 MCG tablet, Take 88 mcg by mouth daily., Disp: , Rfl: ;  losartan (COZAAR) 100 MG tablet, Take 100 mg by mouth daily., Disp: , Rfl:  Melatonin 3 MG TABS, Take 3 mg by mouth at bedtime., Disp: , Rfl: ;  omega-3 acid ethyl esters (LOVAZA) 1 G capsule, Take 2 g by mouth daily., Disp: , Rfl: ;  PARoxetine (PAXIL) 20 MG tablet, Take 20 mg by mouth every morning., Disp: , Rfl: ;  polyethylene glycol (MIRALAX / GLYCOLAX) packet, Take 17 g by mouth daily as needed. For constipation, Disp: , Rfl:  ranolazine (RANEXA) 500 MG 12 hr tablet, Take 1 tablet (500 mg total) by mouth 2 (two) times daily., Disp: 60 tablet, Rfl: 5;  acetaminophen (TYLENOL) 325 MG tablet, Take 650 mg by mouth every 4 (four) hours as needed. For pain, Disp: , Rfl: ;  diltiazem (TIAZAC) 240 MG 24 hr capsule, Take 240 mg by mouth daily., Disp: , Rfl: ;  docusate sodium (COLACE)  100 MG capsule, Take 100 mg by mouth at bedtime., Disp: , Rfl:  guaiFENesin (ROBITUSSIN) 100 MG/5ML SOLN, Take 10 mLs by mouth every 4 (four) hours while awake., Disp: , Rfl: ;  isosorbide mononitrate (IMDUR) 60 MG 24 hr tablet, Take 60 mg by mouth daily., Disp: , Rfl: ;  metoprolol (LOPRESSOR) 50 MG tablet, Take 50 mg by mouth daily. Daily at 0800, Disp: , Rfl: ;  metoprolol tartrate (LOPRESSOR) 25 MG tablet, Take 25 mg by mouth daily at 8 pm. , Disp: , Rfl:  Multiple Vitamin (MULTIVITAMIN WITH MINERALS) TABS, Take 1 tablet by mouth daily., Disp: , Rfl: ;  nitroGLYCERIN (NITROSTAT) 0.4 MG SL tablet, Place 0.4 mg under the tongue every 5 (five) minutes as needed. For chest pain, Disp: , Rfl:   BP 112/63  Pulse 81  Ht 5' (1.524 m)  Wt 137 lb 11.2 oz (62.46 kg)  BMI 26.89 kg/m2  SpO2 96%  Body mass index is 26.89 kg/(m^2).            Review of Systems denies chest pain but does complain of dyspnea on exertion, generalized weakness, leg discomfort bilaterally, she had coronary artery bypass grafting in 1993 and has done well since that time. She does have chronic atrial fibrillation managed with beta blockers-no Coumadin    Objective:   Physical Exam blood pressure 112/63 heart rate 81 respirations 16 Gen.-alert and oriented x3 in no apparent distress HEENT normal for age Lungs no rhonchi or wheezing Cardiovascular regular rhythm no murmurs carotid pulses 3+ palpable no bruits audible Abdomen soft nontender no palpable masses Musculoskeletal free of  major deformities Skin clear -no rashes Neurologic normal Lower extremities absent femoral popliteal and distal pulses bilaterally. Both feet are adequately perfused with no rest pain or numbness. Good filling of peripheral veins.  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 fib-scheduled to see Dr. York Ram next week    Plan:     #1 duplex scan of both lower extremities today with ABIs #2 obtain CT angiogram of the abdomen and lower extremities #3 cardiac clearance by Dr. Gery Pray next week for upcoming surgery #4 needs redo axillobifemoral bypass graft most likely-we'll determine after above studies have been completed #5 return to see me on Tuesday, May 27

## 2012-09-24 ENCOUNTER — Encounter: Payer: Self-pay | Admitting: *Deleted

## 2012-09-24 ENCOUNTER — Other Ambulatory Visit: Payer: Self-pay | Admitting: *Deleted

## 2012-09-24 ENCOUNTER — Encounter: Payer: Self-pay | Admitting: Cardiovascular Disease

## 2012-09-24 DIAGNOSIS — I7409 Other arterial embolism and thrombosis of abdominal aorta: Secondary | ICD-10-CM

## 2012-09-24 DIAGNOSIS — Z48812 Encounter for surgical aftercare following surgery on the circulatory system: Secondary | ICD-10-CM

## 2012-09-24 DIAGNOSIS — I739 Peripheral vascular disease, unspecified: Secondary | ICD-10-CM

## 2012-09-24 DIAGNOSIS — I771 Stricture of artery: Secondary | ICD-10-CM

## 2012-09-24 NOTE — Addendum Note (Signed)
Addended by: Sharee Pimple on: 09/24/2012 10:37 AM   Modules accepted: Orders

## 2012-09-25 ENCOUNTER — Encounter: Payer: Self-pay | Admitting: Internal Medicine

## 2012-09-25 ENCOUNTER — Encounter: Payer: Self-pay | Admitting: Physician Assistant

## 2012-09-25 ENCOUNTER — Ambulatory Visit (INDEPENDENT_AMBULATORY_CARE_PROVIDER_SITE_OTHER): Payer: Medicare Other | Admitting: Physician Assistant

## 2012-09-25 VITALS — BP 140/80 | HR 89 | Ht 60.0 in | Wt 138.2 lb

## 2012-09-25 DIAGNOSIS — I2510 Atherosclerotic heart disease of native coronary artery without angina pectoris: Secondary | ICD-10-CM

## 2012-09-25 DIAGNOSIS — Z01818 Encounter for other preprocedural examination: Secondary | ICD-10-CM

## 2012-09-25 DIAGNOSIS — I25810 Atherosclerosis of coronary artery bypass graft(s) without angina pectoris: Secondary | ICD-10-CM

## 2012-09-25 DIAGNOSIS — I4891 Unspecified atrial fibrillation: Secondary | ICD-10-CM

## 2012-09-25 DIAGNOSIS — E785 Hyperlipidemia, unspecified: Secondary | ICD-10-CM

## 2012-09-25 DIAGNOSIS — I482 Chronic atrial fibrillation, unspecified: Secondary | ICD-10-CM

## 2012-09-25 DIAGNOSIS — I739 Peripheral vascular disease, unspecified: Secondary | ICD-10-CM

## 2012-09-25 NOTE — Assessment & Plan Note (Signed)
Cardiac standpoint patient appears to be doing well without complaints of chest pain. She just had a recent heart catheterization in January which showed no change in her anatomy and good LVEF. She's probably a low to moderate risk given history of coronary disease and her age. Her atrial fibrillation is well controlled does have a mildly prolonged QTC. No ischemic changes

## 2012-09-25 NOTE — Assessment & Plan Note (Signed)
Here today for pre-op clearance.

## 2012-09-25 NOTE — Assessment & Plan Note (Signed)
Recent left heart catheterization revealing a normal left main, normal LAD, first OM branch of the left circumflex had a segmental proximal 30% stenosis. Right coronary artery was occluded proximally with grade 2-3 left right collaterals. Left ventriculography showed an EF estimated 60% without wall motion abnormalities

## 2012-09-25 NOTE — Assessment & Plan Note (Signed)
Rate is currently well controlled even on reduced beta blocker dose.

## 2012-09-25 NOTE — Progress Notes (Signed)
Patient ID: [name] Short, female   DOB: February 04, 1928, 79 y.o.   MRN: [id number] Date:  09/25/2012   ID:  [name] Short, DOB [birth year], MRN [id number]  PCP:  MAZZOCCHI, Rise Mu, MD  Primary Cardiologist: Dr. Allyson Sabal    History of Present Illness: [name] Short is a 79 y.o. female with a history of coronary artery bypass grafting in 1993 at which time she had SVG to the RCA and SVG to the circumflex. I have subsequently occluded.  Her history also includes chronic itch or fibrillation diabetes mellitus, hypertension, hypothyroidism, peripheral vascular disease. She has she was admitted from 05/30/2012 chest pain and underwent cardiac catheterization. There is no change in her anatomy. The LAD was patent the circumflex was patent. Left to right collaterals with total RCA. LV function was good.  Patient was recently hospitalized from 09/18/2012 for a lower extremity pain and drug-induced bradycardia. She was admitted to [facility]. Her Lopressor was decreased to 12.5 mg twice daily.  She was also seen by Dr. Sondra Come with vascular surgery who did not think she needed emergent revascularization recommend outpatient followup  With Dr. [name].  Dr. [name] is recommended redo axillobifemoral bypass graft the patient presents today for preop clearance. Patient reports her lower remedy pain with walking any distance. She also reports some dizziness mild with change in position which is not too frequent. She has infrequent paroxysmal nocturnal dyspnea. She denies nausea, vomiting, fever, chest pain, shortness of breath, lower extremity edema, abdominal pain.    Wt Readings from Last 3 Encounters:  09/25/12 138 lb 3.2 oz (62.687 kg)  09/23/12 137 lb 11.2 oz (62.46 kg)  08/12/12 143 lb 4.8 oz (65 kg)     Past Medical History  Diagnosis Date  . Diabetes mellitus without complication   . Hypertension   . Coronary artery disease   . Thyroid disease   . Depression   . Memory loss   . Skin cancer   . PVD  (peripheral vascular disease)   . CHF (congestive heart failure)   . Atrial fibrillation   . Myocardial infarction   . Dyspnea 06/01/2012    2D Echo - EF 60-65, mitral valve calcified annulus, left atrium severely dilated, right atrium moderately dilated  . Chest pain 10/07/2009    2D Echo - EF 50-55, mitral valve calcified annulus  . CAD (coronary artery disease) 03/01/2008    Lexiscan - EF 79, no ECG changes/EKG negative for ischemia, post-stress EF 86  . Cerebral atherosclerosis 10/15/2011    Extracranial Examination - mild-moderate amount smooth mixed density plaque elevating velocities within the proximal and mid segments of the internal carotid artery  . Claudication 09/01/2010    LEA Doppler - Bilateral ABIs-demonstrated moderate arterial insufficiency to the lower extremities at rest, Right CIA/EIA-known occlusive disease, PV Cath    Current Outpatient Prescriptions  Medication Sig Dispense Refill  . amLODipine (NORVASC) 5 MG tablet Take 5 mg by mouth daily.      Marland Kitchen aspirin 81 MG chewable tablet Chew 81 mg by mouth daily.      Marland Kitchen atorvastatin (LIPITOR) 40 MG tablet Take 40 mg by mouth daily.      . cholecalciferol (VITAMIN D) 1000 UNITS tablet Take 1,000 Units by mouth daily.      . clopidogrel (PLAVIX) 75 MG tablet Take 75 mg by mouth daily.      . diazepam (VALIUM) 5 MG tablet Take 5 mg by mouth 2 (two) times daily.      Marland Kitchen  diphenhydrAMINE (BENADRYL) 12.5 MG/5ML elixir Take 12.5 mg by mouth as needed. For allergic reaction to norco      . docusate sodium (COLACE) 100 MG capsule Take 100 mg by mouth at bedtime.      . donepezil (ARICEPT) 5 MG tablet Take 5 mg by mouth daily.      Marland Kitchen glimepiride (AMARYL) 2 MG tablet Take 2 mg by mouth daily before breakfast.      . HYDROcodone-acetaminophen (NORCO/VICODIN) 5-325 MG per tablet Take 1 tablet by mouth every 6 (six) hours as needed. For pain      . isosorbide mononitrate (IMDUR) 30 MG 24 hr tablet Take 1 tablet (30 mg total) by mouth daily.  Take at night  30 tablet  5  . lansoprazole (PREVACID) 30 MG capsule Take 30 mg by mouth 2 (two) times daily.      Marland Kitchen levothyroxine (SYNTHROID, LEVOTHROID) 88 MCG tablet Take 88 mcg by mouth daily.      Marland Kitchen losartan (COZAAR) 100 MG tablet Take 100 mg by mouth daily.      . Melatonin 3 MG TABS Take 3 mg by mouth at bedtime.      . metoprolol tartrate (LOPRESSOR) 25 MG tablet Take 12.5 mg by mouth daily at 8 pm.       . Multiple Vitamin (MULTIVITAMIN WITH MINERALS) TABS Take 1 tablet by mouth daily.      . nitroGLYCERIN (NITROSTAT) 0.4 MG SL tablet Place 0.4 mg under the tongue every 5 (five) minutes as needed. For chest pain      . omega-3 acid ethyl esters (LOVAZA) 1 G capsule Take 2 g by mouth daily.      Marland Kitchen PARoxetine (PAXIL) 20 MG tablet Take 20 mg by mouth every morning.      . polyethylene glycol (MIRALAX / GLYCOLAX) packet Take 17 g by mouth daily as needed. For constipation      . ranolazine (RANEXA) 500 MG 12 hr tablet Take 1 tablet (500 mg total) by mouth 2 (two) times daily.  60 tablet  5  . acetaminophen (TYLENOL) 325 MG tablet Take 650 mg by mouth every 4 (four) hours as needed. For pain      . diltiazem (TIAZAC) 240 MG 24 hr capsule Take 240 mg by mouth daily.      Marland Kitchen guaiFENesin (ROBITUSSIN) 100 MG/5ML SOLN Take 10 mLs by mouth every 4 (four) hours while awake.      . isosorbide mononitrate (IMDUR) 60 MG 24 hr tablet Take 60 mg by mouth daily.      . metoprolol (LOPRESSOR) 50 MG tablet Take 50 mg by mouth daily. Daily at 0800       No current facility-administered medications for this visit.    Allergies:    Allergies  Allergen Reactions  . Codeine Itching    Social History:  The patient  reports that she quit smoking about 21 years ago. She has never used smokeless tobacco. She reports that she does not drink alcohol or use illicit drugs.   ROS:  Please see the history of present illness.  All other systems reviewed and negative.   PHYSICAL EXAM: VS:  BP 140/80  Pulse 89   Ht 5' (1.524 m)  Wt 138 lb 3.2 oz (62.687 kg)  BMI 26.99 kg/m2 Well nourished, well developed, in no acute distress HEENT: Pupils equal round react to light and accommodation extraocular movements were intact  Neck: no JVD Cardiac:  Irregular irregular; no murmur Lungs:  clear  to auscultation bilaterally, no wheezing, rhonchi or rales Abd: soft, nontender, no hepatomegaly Ext: no  lower extremity edema Skin: warm and dry Neuro:   grossly normal   EKG:  atrial fibrillation with controlled ventricular rate of 89 beats per minute.  ASSESSMENT AND PLAN:  Problem List Items Addressed This Visit   Dyslipidemia (Chronic)   Chronic atrial fibrillation- ventricular rate is controlled (Chronic)     Rate is currently well controlled even on reduced beta blocker dose.    Relevant Medications      amLODipine (NORVASC) 5 MG tablet   PVD (peripheral vascular disease), AOBF '94     Here today for preop clearance    Relevant Medications      amLODipine (NORVASC) 5 MG tablet   CAD, CABG X 2 '93- Last cath 05/2012- no change from 2011, medical Rx- EF of 60-65% via echo 05/2012     Recent left heart catheterization revealing a normal left main, normal LAD, first OM branch of the left circumflex had a segmental proximal 30% stenosis. Right coronary artery was occluded proximally with grade 2-3 left right collaterals. Left ventriculography showed an EF estimated 60% without wall motion abnormalities    Relevant Medications      amLODipine (NORVASC) 5 MG tablet   Preoperative clearance - Primary     Cardiac standpoint patient appears to be doing well without complaints of chest pain. She just had a recent heart catheterization in January which showed no change in her anatomy and good LVEF. She's probably a low to moderate risk given history of coronary disease and her age. Her atrial fibrillation is well controlled does have a mildly prolonged QTC. No ischemic changes    CAD (coronary artery disease) of  artery bypass graft   Relevant Medications      amLODipine (NORVASC) 5 MG tablet   Other Relevant Orders      EKG 12-Lead

## 2012-09-26 ENCOUNTER — Telehealth: Payer: Self-pay | Admitting: Cardiovascular Disease

## 2012-09-26 NOTE — Telephone Encounter (Signed)
Please call Carol-concerning [name] Short-Pt is scheduled for ax bi stem by pass graft on 5-28-Recommending  That she hold for plavix for one week-last dose on 5-20-Is this all right?

## 2012-09-26 NOTE — Telephone Encounter (Signed)
Returned call. Left message to call back.

## 2012-09-26 NOTE — Telephone Encounter (Signed)
Call to [facility].  Stated pt is scheduled as previously stated and she wanted to let Dr. Allyson Sabal know that pt has been instructed to hold plavix for 7 days before surgery.  Okey Regal also wanted to mention that pt is scheduled for cardiac clearance next week.  Carol informed Dr. Allyson Sabal will be notified

## 2012-09-29 ENCOUNTER — Telehealth: Payer: Self-pay | Admitting: *Deleted

## 2012-09-29 NOTE — Telephone Encounter (Signed)
i called [facility].  They have orders from Dr. [name] to hold plavix prior to procedure. I verbalized Dr. Hazle Coca agreement with that.

## 2012-09-29 NOTE — Telephone Encounter (Signed)
Message copied by Marella Bile on Mon Sep 29, 2012 12:03 PM ------      Message from: Runell Gess      Created: Mon Sep 29, 2012  6:39 AM      Regarding: Holding plavix before PV surgery 5/28       OK to hold plavix. Pull paper chart today for me to review. ------

## 2012-09-29 NOTE — Telephone Encounter (Signed)
Message copied by Marella Bile on Mon Sep 29, 2012  1:00 PM ------      Message from: Runell Gess      Created: Mon Sep 29, 2012  6:39 AM      Regarding: Holding plavix before PV surgery 5/28       OK to hold plavix. Pull paper chart today for me to review. ------

## 2012-09-29 NOTE — Telephone Encounter (Signed)
Bryan hager saw patient in the office and gave her clearance

## 2012-10-01 ENCOUNTER — Other Ambulatory Visit: Payer: Self-pay

## 2012-10-02 ENCOUNTER — Encounter (HOSPITAL_COMMUNITY)
Admission: RE | Admit: 2012-10-02 | Discharge: 2012-10-02 | Disposition: A | Discharge status: Home / Self Care | Payer: Medicare Other | Source: Ambulatory Visit | Attending: Vascular Surgery | Admitting: Vascular Surgery

## 2012-10-02 ENCOUNTER — Ambulatory Visit
Admission: RE | Admit: 2012-10-02 | Discharge: 2012-10-02 | Disposition: A | Discharge status: Home / Self Care | Payer: Medicare Other | Source: Ambulatory Visit | Attending: Vascular Surgery | Admitting: Vascular Surgery

## 2012-10-02 ENCOUNTER — Encounter (HOSPITAL_COMMUNITY): Payer: Self-pay

## 2012-10-02 DIAGNOSIS — I739 Peripheral vascular disease, unspecified: Secondary | ICD-10-CM

## 2012-10-02 DIAGNOSIS — T82598A Other mechanical complication of other cardiac and vascular devices and implants, initial encounter: Secondary | ICD-10-CM

## 2012-10-02 HISTORY — DX: Gastro-esophageal reflux disease without esophagitis: K21.9

## 2012-10-02 HISTORY — DX: Unspecified osteoarthritis, unspecified site: M19.90

## 2012-10-02 HISTORY — DX: Angina pectoris, unspecified: I20.9

## 2012-10-02 LAB — COMPREHENSIVE METABOLIC PANEL
AST: 22 U/L (ref 0–37)
Albumin: 4 g/dL (ref 3.5–5.2)
Alkaline Phosphatase: 138 U/L — ABNORMAL HIGH (ref 39–117)
Chloride: 96 mEq/L (ref 96–112)
Creatinine, Ser: 0.93 mg/dL (ref 0.50–1.10)
Potassium: 4.7 mEq/L (ref 3.5–5.1)
Total Bilirubin: 0.4 mg/dL (ref 0.3–1.2)
Total Protein: 7.3 g/dL (ref 6.0–8.3)

## 2012-10-02 LAB — URINALYSIS, ROUTINE W REFLEX MICROSCOPIC
Bilirubin Urine: NEGATIVE
Glucose, UA: NEGATIVE mg/dL
Hgb urine dipstick: NEGATIVE
Ketones, ur: NEGATIVE mg/dL
Protein, ur: NEGATIVE mg/dL
Urobilinogen, UA: 0.2 mg/dL (ref 0.0–1.0)

## 2012-10-02 LAB — CBC
MCHC: 34.6 g/dL (ref 30.0–36.0)
Platelets: 253 10*3/uL (ref 150–400)
RDW: 14.4 % (ref 11.5–15.5)
WBC: 8.5 10*3/uL (ref 4.0–10.5)

## 2012-10-02 LAB — PROTIME-INR: INR: 1.03 (ref 0.00–1.49)

## 2012-10-02 LAB — APTT: aPTT: 29 seconds (ref 24–37)

## 2012-10-02 LAB — SURGICAL PCR SCREEN: MRSA, PCR: NEGATIVE

## 2012-10-02 MED ORDER — IOHEXOL 350 MG/ML SOLN
150.0000 mL | Freq: Once | INTRAVENOUS | Status: AC | PRN
  Administered 2012-10-02: 150 mL via INTRAVENOUS

## 2012-10-02 NOTE — Pre-Procedure Instructions (Signed)
[name] Short  10/02/2012   Your procedure is scheduled on:  Wednesday, May 28th.  Report to [name] at 6:30AM.  Call this number if you have problems the morning of surgery: [phone]   Remember:   Do not eat food or drink liquids after midnight.   Take these medicines the morning of surgery with A SIP OF WATER:Amlodipine (Norvasc), Diazepam (Valium), Diltiazem (Tiazac), Donepezil (Aricept), Isorbide (Imdur), Lanosoprazole ( Prevacid), Levothyroxine (Synthyroid), Metoprolol (Lopressor), Paroxetine (Paxil), Ranolazine (Ranexda) May take if needed: acetaminophen (TYLENOL)  diphenhydrAMINE (BENADRYL)    Do not wear jewelry, make-up or nail polish.  Do not wear lotions, powders, or perfumes  Do not shave 48 hours prior to surgery.   Do not bring valuables to the hospital.  Contacts, dentures or bridgework may not be worn into surgery.  Leave suitcase in the car. After surgery it may be brought to your room.  For patients admitted to the hospital, checkout time is 11:00 AM the day of discharge.     Special Instructions: Shower using CHG 2 nights before surgery and the night before surgery.  If you shower the day of surgery use CHG.  Use special wash - you have one bottle of CHG for all showers.  You should use approximately 1/3 of the bottle for each shower.   Please read over the following fact sheets that you were given: Pain Booklet, Coughing and Deep Breathing and Surgical Site Infection Prevention

## 2012-10-03 ENCOUNTER — Encounter: Payer: Self-pay | Admitting: *Deleted

## 2012-10-03 ENCOUNTER — Ambulatory Visit (INDEPENDENT_AMBULATORY_CARE_PROVIDER_SITE_OTHER): Payer: Medicare Other | Admitting: Vascular Surgery

## 2012-10-03 ENCOUNTER — Encounter (HOSPITAL_COMMUNITY): Payer: Self-pay | Admitting: Pharmacy Technician

## 2012-10-03 ENCOUNTER — Encounter: Payer: Self-pay | Admitting: Vascular Surgery

## 2012-10-03 VITALS — BP 122/61 | HR 92 | Ht 60.0 in | Wt 139.0 lb

## 2012-10-03 DIAGNOSIS — I739 Peripheral vascular disease, unspecified: Secondary | ICD-10-CM

## 2012-10-03 NOTE — Progress Notes (Signed)
Subjective:     Patient ID: [name] Short, female   DOB: 07/23/1927, 79 y.o.   MRN: [id number]  HPI this 79-year-old female returns to discuss revascularization scheduled next week. She had a CT angiogram of the abdomen and lower extremities performed yesterday which I have reviewed. She has total occlusion of her aortic graft which we suspected but does have patent superficial femoral and popliteal arteries bilaterally. She also had a duplex scan performed in our office on May 13 and ABIs performed. I've reviewed and interpreted these studies and the ABI is 0.42 on the right and 0.40 on the left. She has severe claudication being unable to walk across the room without having severe pain. She does not have rest pain however.  She was seen by cardiology and getting cardiac clearance. She did have a cardiac catheterization performed in January of this year and it was felt that she did not need any further cardiac testing prior to her axillobifemoral bypass graft.  Past Medical History  Diagnosis Date  . Diabetes mellitus without complication   . Hypertension   . Coronary artery disease   . Thyroid disease   . Depression   . Memory loss   . Skin cancer   . PVD (peripheral vascular disease)   . CHF (congestive heart failure)   . Atrial fibrillation   . Myocardial infarction   . Dyspnea 06/01/2012    2D Echo - EF 60-65, mitral valve calcified annulus, left atrium severely dilated, right atrium moderately dilated  . Chest pain 10/07/2009    2D Echo - EF 50-55, mitral valve calcified annulus  . CAD (coronary artery disease) 03/01/2008    Lexiscan - EF 79, no ECG changes/EKG negative for ischemia, post-stress EF 86  . Cerebral atherosclerosis 10/15/2011    Extracranial Examination - mild-moderate amount smooth mixed density plaque elevating velocities within the proximal and mid segments of the internal carotid artery  . Claudication 09/01/2010    LEA Doppler - Bilateral ABIs-demonstrated moderate  arterial insufficiency to the lower extremities at rest, Right CIA/EIA-known occlusive disease, PV Cath  . Anginal pain     not in in last month  . Dementia   . GERD (gastroesophageal reflux disease)     takes prevacid  . Arthritis     History  Substance Use Topics  . Smoking status: Former Smoker    Quit date: 05/15/1991  . Smokeless tobacco: Never Used  . Alcohol Use: No    Family History  Problem Relation Age of Onset  . Heart disease Father   . Hypertension Father   . Heart attack Father   . Kidney failure Brother   . Hyperlipidemia Daughter     Allergies  Allergen Reactions  . Codeine Itching    Current outpatient prescriptions:acetaminophen (TYLENOL) 325 MG tablet, Take 650 mg by mouth Short 4 (four) hours as needed. For pain, Disp: , Rfl: ;  amLODipine (NORVASC) 5 MG tablet, Take 5 mg by mouth daily., Disp: , Rfl: ;  aspirin 81 MG chewable tablet, Chew 81 mg by mouth daily., Disp: , Rfl: ;  atorvastatin (LIPITOR) 40 MG tablet, Take 40 mg by mouth daily., Disp: , Rfl:  cholecalciferol (VITAMIN D) 1000 UNITS tablet, Take 1,000 Units by mouth daily., Disp: , Rfl: ;  clopidogrel (PLAVIX) 75 MG tablet, Take 75 mg by mouth daily., Disp: , Rfl: ;  diazepam (VALIUM) 5 MG tablet, Take 5 mg by mouth 2 (two) times daily., Disp: , Rfl: ;  diltiazem (TIAZAC)   240 MG 24 hr capsule, Take 240 mg by mouth daily., Disp: , Rfl:  diphenhydrAMINE (BENADRYL) 12.5 MG/5ML elixir, Take 12.5 mg by mouth as needed. For allergic reaction to norco, Disp: , Rfl: ;  docusate sodium (COLACE) 100 MG capsule, Take 100 mg by mouth at bedtime., Disp: , Rfl: ;  donepezil (ARICEPT) 5 MG tablet, Take 5 mg by mouth daily., Disp: , Rfl: ;  glimepiride (AMARYL) 2 MG tablet, Take 2 mg by mouth daily before breakfast., Disp: , Rfl:  guaiFENesin (ROBITUSSIN) 100 MG/5ML SOLN, Take 10 mLs by mouth Short 4 (four) hours while awake., Disp: , Rfl: ;  HYDROcodone-acetaminophen (NORCO/VICODIN) 5-325 MG per tablet, Take 1 tablet  by mouth Short 6 (six) hours as needed. For pain, Disp: , Rfl: ;  isosorbide mononitrate (IMDUR) 30 MG 24 hr tablet, Take 1 tablet (30 mg total) by mouth daily. Take at night, Disp: 30 tablet, Rfl: 5 isosorbide mononitrate (IMDUR) 60 MG 24 hr tablet, Take 60 mg by mouth daily., Disp: , Rfl: ;  lansoprazole (PREVACID) 30 MG capsule, Take 30 mg by mouth 2 (two) times daily., Disp: , Rfl: ;  levothyroxine (SYNTHROID, LEVOTHROID) 88 MCG tablet, Take 88 mcg by mouth daily., Disp: , Rfl: ;  losartan (COZAAR) 100 MG tablet, Take 100 mg by mouth daily., Disp: , Rfl: ;  Melatonin 3 MG TABS, Take 3 mg by mouth at bedtime., Disp: , Rfl:  metoprolol (LOPRESSOR) 50 MG tablet, Take 50 mg by mouth daily. Daily at 0800, Disp: , Rfl: ;  metoprolol tartrate (LOPRESSOR) 25 MG tablet, Take 12.5 mg by mouth daily at 8 pm. , Disp: , Rfl: ;  Multiple Vitamin (MULTIVITAMIN WITH MINERALS) TABS, Take 1 tablet by mouth daily., Disp: , Rfl: ;  nitroGLYCERIN (NITROSTAT) 0.4 MG SL tablet, Place 0.4 mg under the tongue Short 5 (five) minutes as needed. For chest pain, Disp: , Rfl:  omega-3 acid ethyl esters (LOVAZA) 1 G capsule, Take 2 g by mouth daily., Disp: , Rfl: ;  PARoxetine (PAXIL) 20 MG tablet, Take 20 mg by mouth Short morning., Disp: , Rfl: ;  polyethylene glycol (MIRALAX / GLYCOLAX) packet, Take 17 g by mouth daily as needed. For constipation, Disp: , Rfl: ;  ranolazine (RANEXA) 500 MG 12 hr tablet, Take 1 tablet (500 mg total) by mouth 2 (two) times daily., Disp: 60 tablet, Rfl: 5  BP 122/61  Pulse 92  Ht 5' (1.524 m)  Wt 139 lb (63.05 kg)  BMI 27.15 kg/m2  SpO2 96%  Body mass index is 27.15 kg/(m^2).           Review of Systems     Objective:   Physical Exam blood pressure 122/61 heart rate 92 respirations 16 Gen.-alert and oriented x3 in no apparent distress HEENT normal for age Lungs no rhonchi or wheezing Cardiovascular regular rhythm no murmurs carotid pulses 3+ palpable no bruits audible Abdomen  soft nontender no palpable masses Musculoskeletal free of  major deformities Skin clear -no rashes Neurologic normal Lower extremities-absent femoral and distal pulses bilaterally with no evidence of ischemia or infection in either lower extremity      Assessment:     Severe lower trinity occlusive disease with occlusion of aortobifemoral bypass graft placed by me in 1994 with severe claudication    Plan:     Right axillofemoral and right-to-left femoral-femoral bypass to be done on Wednesday, May 28 Will hold Plavix for next 5 days to decrease incidence of bleeding at time of   surgery and then resume postop Risks and benefits thoroughly discussed with patient and she would like to proceed      

## 2012-10-07 ENCOUNTER — Other Ambulatory Visit: Payer: Medicare Other

## 2012-10-07 ENCOUNTER — Ambulatory Visit: Payer: Medicare Other | Admitting: Vascular Surgery

## 2012-10-07 MED ORDER — DEXTROSE 5 % IV SOLN
1.5000 g | INTRAVENOUS | Status: AC
  Administered 2012-10-08: 1.5 g via INTRAVENOUS
  Filled 2012-10-07: qty 1.5

## 2012-10-08 ENCOUNTER — Inpatient Hospital Stay (HOSPITAL_COMMUNITY): Payer: Medicare Other | Admitting: Anesthesiology

## 2012-10-08 ENCOUNTER — Encounter (HOSPITAL_COMMUNITY): Payer: Self-pay | Admitting: Anesthesiology

## 2012-10-08 ENCOUNTER — Inpatient Hospital Stay (HOSPITAL_COMMUNITY)
Admission: RE | Admit: 2012-10-08 | Discharge: 2012-10-13 | DRG: 254 | Disposition: A | Discharge status: Home Health | Payer: Medicare Other | Source: Ambulatory Visit | Attending: Vascular Surgery | Admitting: Vascular Surgery

## 2012-10-08 ENCOUNTER — Encounter (HOSPITAL_COMMUNITY)
Admission: RE | Disposition: A | Discharge status: Home Health | Payer: Self-pay | Source: Ambulatory Visit | Attending: Vascular Surgery

## 2012-10-08 ENCOUNTER — Telehealth: Payer: Self-pay | Admitting: Vascular Surgery

## 2012-10-08 DIAGNOSIS — Z7982 Long term (current) use of aspirin: Secondary | ICD-10-CM

## 2012-10-08 DIAGNOSIS — I70219 Atherosclerosis of native arteries of extremities with intermittent claudication, unspecified extremity: Secondary | ICD-10-CM

## 2012-10-08 DIAGNOSIS — Z96659 Presence of unspecified artificial knee joint: Secondary | ICD-10-CM

## 2012-10-08 DIAGNOSIS — E119 Type 2 diabetes mellitus without complications: Secondary | ICD-10-CM

## 2012-10-08 DIAGNOSIS — Z888 Allergy status to other drugs, medicaments and biological substances status: Secondary | ICD-10-CM

## 2012-10-08 DIAGNOSIS — E785 Hyperlipidemia, unspecified: Secondary | ICD-10-CM

## 2012-10-08 DIAGNOSIS — Z79899 Other long term (current) drug therapy: Secondary | ICD-10-CM

## 2012-10-08 DIAGNOSIS — M129 Arthropathy, unspecified: Secondary | ICD-10-CM | POA: Diagnosis present

## 2012-10-08 DIAGNOSIS — I672 Cerebral atherosclerosis: Secondary | ICD-10-CM | POA: Diagnosis present

## 2012-10-08 DIAGNOSIS — I4891 Unspecified atrial fibrillation: Secondary | ICD-10-CM | POA: Diagnosis present

## 2012-10-08 DIAGNOSIS — Z9861 Coronary angioplasty status: Secondary | ICD-10-CM

## 2012-10-08 DIAGNOSIS — I10 Essential (primary) hypertension: Secondary | ICD-10-CM | POA: Diagnosis present

## 2012-10-08 DIAGNOSIS — Z7902 Long term (current) use of antithrombotics/antiplatelets: Secondary | ICD-10-CM

## 2012-10-08 DIAGNOSIS — I252 Old myocardial infarction: Secondary | ICD-10-CM

## 2012-10-08 DIAGNOSIS — I2510 Atherosclerotic heart disease of native coronary artery without angina pectoris: Secondary | ICD-10-CM | POA: Diagnosis present

## 2012-10-08 DIAGNOSIS — F329 Major depressive disorder, single episode, unspecified: Secondary | ICD-10-CM | POA: Diagnosis present

## 2012-10-08 DIAGNOSIS — F3289 Other specified depressive episodes: Secondary | ICD-10-CM | POA: Diagnosis present

## 2012-10-08 DIAGNOSIS — E1149 Type 2 diabetes mellitus with other diabetic neurological complication: Secondary | ICD-10-CM | POA: Diagnosis present

## 2012-10-08 DIAGNOSIS — F0390 Unspecified dementia without behavioral disturbance: Secondary | ICD-10-CM | POA: Diagnosis present

## 2012-10-08 DIAGNOSIS — T82898A Other specified complication of vascular prosthetic devices, implants and grafts, initial encounter: Principal | ICD-10-CM | POA: Diagnosis present

## 2012-10-08 DIAGNOSIS — I739 Peripheral vascular disease, unspecified: Secondary | ICD-10-CM

## 2012-10-08 DIAGNOSIS — Z01812 Encounter for preprocedural laboratory examination: Secondary | ICD-10-CM

## 2012-10-08 DIAGNOSIS — Z951 Presence of aortocoronary bypass graft: Secondary | ICD-10-CM

## 2012-10-08 DIAGNOSIS — E1142 Type 2 diabetes mellitus with diabetic polyneuropathy: Secondary | ICD-10-CM | POA: Diagnosis present

## 2012-10-08 DIAGNOSIS — Z8249 Family history of ischemic heart disease and other diseases of the circulatory system: Secondary | ICD-10-CM

## 2012-10-08 DIAGNOSIS — Y832 Surgical operation with anastomosis, bypass or graft as the cause of abnormal reaction of the patient, or of later complication, without mention of misadventure at the time of the procedure: Secondary | ICD-10-CM | POA: Diagnosis present

## 2012-10-08 DIAGNOSIS — Z87891 Personal history of nicotine dependence: Secondary | ICD-10-CM

## 2012-10-08 DIAGNOSIS — K219 Gastro-esophageal reflux disease without esophagitis: Secondary | ICD-10-CM | POA: Diagnosis present

## 2012-10-08 HISTORY — PX: AXILLARY-FEMORAL BYPASS GRAFT: SHX894

## 2012-10-08 LAB — CREATININE, SERUM
Creatinine, Ser: 0.72 mg/dL (ref 0.50–1.10)
GFR calc Af Amer: >90 mL/min
GFR calc non Af Amer: 78 mL/min — ABNORMAL LOW

## 2012-10-08 LAB — CBC
HCT: 32.5 % — ABNORMAL LOW (ref 36.0–46.0)
Hemoglobin: 11.8 g/dL — ABNORMAL LOW (ref 12.0–15.0)
MCH: 30.3 pg (ref 26.0–34.0)
MCHC: 36.3 g/dL — ABNORMAL HIGH (ref 30.0–36.0)
MCV: 83.5 fL (ref 78.0–100.0)
Platelets: 129 K/uL — ABNORMAL LOW (ref 150–400)
RBC: 3.89 MIL/uL (ref 3.87–5.11)
RDW: 14.4 % (ref 11.5–15.5)
WBC: 11 K/uL — ABNORMAL HIGH (ref 4.0–10.5)

## 2012-10-08 LAB — PREPARE RBC (CROSSMATCH)

## 2012-10-08 LAB — GLUCOSE, CAPILLARY: Glucose-Capillary: 138 mg/dL — ABNORMAL HIGH (ref 70–99)

## 2012-10-08 SURGERY — CREATION, BYPASS, ARTERIAL, AXILLARY TO BILATERAL FEMORAL, USING GRAFT
Anesthesia: General | Laterality: Right | Wound class: Clean

## 2012-10-08 MED ORDER — SODIUM CHLORIDE 0.9 % IV SOLN: 500.0000 mL | Freq: Once | INTRAVENOUS | Status: AC | PRN

## 2012-10-08 MED ORDER — HEPARIN SODIUM (PORCINE) 1000 UNIT/ML IJ SOLN
INTRAMUSCULAR | Status: DC | PRN
  Administered 2012-10-08: 6000 [IU] via INTRAVENOUS
  Administered 2012-10-08: 2000 [IU] via INTRAVENOUS

## 2012-10-08 MED ORDER — SODIUM CHLORIDE 0.9 % IV SOLN
INTRAVENOUS | Status: DC
  Administered 2012-10-08: 14:00:00 via INTRAVENOUS

## 2012-10-08 MED ORDER — GLIMEPIRIDE 2 MG PO TABS
2.0000 mg | ORAL_TABLET | Freq: Every day | ORAL | Status: DC
  Administered 2012-10-09 – 2012-10-13 (×5): 2 mg via ORAL
  Filled 2012-10-08 (×7): qty 1

## 2012-10-08 MED ORDER — DONEPEZIL HCL 5 MG PO TABS
5.0000 mg | ORAL_TABLET | Freq: Every day | ORAL | Status: DC
  Administered 2012-10-09 – 2012-10-13 (×5): 5 mg via ORAL
  Filled 2012-10-08 (×5): qty 1

## 2012-10-08 MED ORDER — ENOXAPARIN SODIUM 40 MG/0.4ML ~~LOC~~ SOLN
40.0000 mg | SUBCUTANEOUS | Status: DC
  Administered 2012-10-09 – 2012-10-13 (×5): 40 mg via SUBCUTANEOUS
  Filled 2012-10-08 (×5): qty 0.4

## 2012-10-08 MED ORDER — ALBUMIN HUMAN 5 % IV SOLN
INTRAVENOUS | Status: AC
  Filled 2012-10-08: qty 500

## 2012-10-08 MED ORDER — BISACODYL 10 MG RE SUPP: 10.0000 mg | Freq: Every day | RECTAL | Status: DC | PRN

## 2012-10-08 MED ORDER — MORPHINE SULFATE 2 MG/ML IJ SOLN
INTRAMUSCULAR | Status: AC
  Administered 2012-10-08: 2 mg via INTRAVENOUS
  Filled 2012-10-08: qty 1

## 2012-10-08 MED ORDER — POLYETHYLENE GLYCOL 3350 17 G PO PACK
17.0000 g | PACK | Freq: Every day | ORAL | Status: DC | PRN
  Administered 2012-10-11: 17 g via ORAL
  Filled 2012-10-08: qty 1

## 2012-10-08 MED ORDER — PANTOPRAZOLE SODIUM 40 MG PO TBEC
40.0000 mg | DELAYED_RELEASE_TABLET | Freq: Every day | ORAL | Status: DC
  Administered 2012-10-09 – 2012-10-13 (×5): 40 mg via ORAL
  Filled 2012-10-08 (×5): qty 1

## 2012-10-08 MED ORDER — FENTANYL CITRATE 0.05 MG/ML IJ SOLN
INTRAMUSCULAR | Status: DC | PRN
  Administered 2012-10-08 (×2): 50 ug via INTRAVENOUS
  Administered 2012-10-08: 125 ug via INTRAVENOUS
  Administered 2012-10-08: 25 ug via INTRAVENOUS

## 2012-10-08 MED ORDER — POTASSIUM CHLORIDE CRYS ER 20 MEQ PO TBCR: 20.0000 meq | EXTENDED_RELEASE_TABLET | Freq: Every day | ORAL | Status: DC | PRN

## 2012-10-08 MED ORDER — LOSARTAN POTASSIUM 50 MG PO TABS
100.0000 mg | ORAL_TABLET | Freq: Every day | ORAL | Status: DC
  Administered 2012-10-10 – 2012-10-13 (×4): 100 mg via ORAL
  Filled 2012-10-08 (×6): qty 2

## 2012-10-08 MED ORDER — ALUM & MAG HYDROXIDE-SIMETH 200-200-20 MG/5ML PO SUSP: 15.0000 mL | ORAL | Status: DC | PRN

## 2012-10-08 MED ORDER — ASPIRIN 81 MG PO CHEW
81.0000 mg | CHEWABLE_TABLET | Freq: Every day | ORAL | Status: DC
  Administered 2012-10-09 – 2012-10-13 (×5): 81 mg via ORAL
  Filled 2012-10-08 (×5): qty 1

## 2012-10-08 MED ORDER — ONDANSETRON HCL 4 MG/2ML IJ SOLN: 4.0000 mg | Freq: Once | INTRAMUSCULAR | Status: DC | PRN

## 2012-10-08 MED ORDER — MORPHINE SULFATE 2 MG/ML IJ SOLN
2.0000 mg | INTRAMUSCULAR | Status: DC | PRN
  Administered 2012-10-08 (×2): 2 mg via INTRAVENOUS
  Filled 2012-10-08 (×2): qty 1

## 2012-10-08 MED ORDER — NEOSTIGMINE METHYLSULFATE 1 MG/ML IJ SOLN
INTRAMUSCULAR | Status: DC | PRN
  Administered 2012-10-08: 3 mg via INTRAVENOUS

## 2012-10-08 MED ORDER — LACTATED RINGERS IV SOLN
INTRAVENOUS | Status: DC | PRN
  Administered 2012-10-08 (×2): via INTRAVENOUS

## 2012-10-08 MED ORDER — GUAIFENESIN-DM 100-10 MG/5ML PO SYRP: 15.0000 mL | ORAL_SOLUTION | ORAL | Status: DC | PRN

## 2012-10-08 MED ORDER — PHENOL 1.4 % MT LIQD: 1.0000 | OROMUCOSAL | Status: DC | PRN

## 2012-10-08 MED ORDER — ACETAMINOPHEN 325 MG PO TABS: 325.0000 mg | ORAL_TABLET | ORAL | Status: DC | PRN

## 2012-10-08 MED ORDER — DIAZEPAM 5 MG PO TABS
5.0000 mg | ORAL_TABLET | Freq: Two times a day (BID) | ORAL | Status: DC
  Administered 2012-10-09 – 2012-10-13 (×9): 5 mg via ORAL
  Filled 2012-10-08 (×9): qty 1

## 2012-10-08 MED ORDER — ISOSORBIDE MONONITRATE ER 30 MG PO TB24
30.0000 mg | ORAL_TABLET | Freq: Every day | ORAL | Status: DC
  Administered 2012-10-08 – 2012-10-12 (×5): 30 mg via ORAL
  Filled 2012-10-08 (×6): qty 1

## 2012-10-08 MED ORDER — PHENYLEPHRINE HCL 10 MG/ML IJ SOLN
INTRAMUSCULAR | Status: DC | PRN
  Administered 2012-10-08: 120 ug via INTRAVENOUS
  Administered 2012-10-08: 40 ug via INTRAVENOUS
  Administered 2012-10-08: 120 ug via INTRAVENOUS
  Administered 2012-10-08: 80 ug via INTRAVENOUS
  Administered 2012-10-08: 120 ug via INTRAVENOUS
  Administered 2012-10-08: 80 ug via INTRAVENOUS
  Administered 2012-10-08: 120 ug via INTRAVENOUS

## 2012-10-08 MED ORDER — AMLODIPINE BESYLATE 5 MG PO TABS
5.0000 mg | ORAL_TABLET | Freq: Every day | ORAL | Status: DC
  Administered 2012-10-10 – 2012-10-13 (×4): 5 mg via ORAL
  Filled 2012-10-08 (×5): qty 1

## 2012-10-08 MED ORDER — NITROGLYCERIN 0.4 MG SL SUBL: 0.4000 mg | SUBLINGUAL_TABLET | SUBLINGUAL | Status: DC | PRN

## 2012-10-08 MED ORDER — ATORVASTATIN CALCIUM 40 MG PO TABS
40.0000 mg | ORAL_TABLET | Freq: Every day | ORAL | Status: DC
  Administered 2012-10-08 – 2012-10-12 (×5): 40 mg via ORAL
  Filled 2012-10-08 (×6): qty 1

## 2012-10-08 MED ORDER — CEFUROXIME SODIUM 1.5 G IJ SOLR
1.5000 g | Freq: Two times a day (BID) | INTRAMUSCULAR | Status: AC
  Administered 2012-10-08 – 2012-10-09 (×2): 1.5 g via INTRAVENOUS
  Filled 2012-10-08 (×2): qty 1.5

## 2012-10-08 MED ORDER — HYDRALAZINE HCL 20 MG/ML IJ SOLN: 10.0000 mg | INTRAMUSCULAR | Status: DC | PRN

## 2012-10-08 MED ORDER — METOPROLOL TARTRATE 1 MG/ML IV SOLN: 2.0000 mg | INTRAVENOUS | Status: DC | PRN

## 2012-10-08 MED ORDER — PROPOFOL 10 MG/ML IV BOLUS
INTRAVENOUS | Status: DC | PRN
  Administered 2012-10-08: 120 mg via INTRAVENOUS

## 2012-10-08 MED ORDER — HYDROCODONE-ACETAMINOPHEN 5-325 MG PO TABS
1.0000 | ORAL_TABLET | Freq: Four times a day (QID) | ORAL | Status: DC | PRN
  Administered 2012-10-08 – 2012-10-09 (×4): 1 via ORAL
  Administered 2012-10-09: 2 via ORAL
  Administered 2012-10-09: 1 via ORAL
  Administered 2012-10-10: 2 via ORAL
  Administered 2012-10-10: 1 via ORAL
  Administered 2012-10-10: 2 via ORAL
  Administered 2012-10-11: 1 via ORAL
  Administered 2012-10-11: 2 via ORAL
  Administered 2012-10-12: 1 via ORAL
  Administered 2012-10-12: 2 via ORAL
  Administered 2012-10-13 (×2): 1 via ORAL
  Filled 2012-10-08 (×2): qty 1
  Filled 2012-10-08: qty 2
  Filled 2012-10-08 (×4): qty 1
  Filled 2012-10-08 (×2): qty 2
  Filled 2012-10-08: qty 1
  Filled 2012-10-08: qty 2
  Filled 2012-10-08 (×2): qty 1
  Filled 2012-10-08: qty 2
  Filled 2012-10-08 (×2): qty 1

## 2012-10-08 MED ORDER — LACTATED RINGERS IV SOLN
INTRAVENOUS | Status: DC | PRN
  Administered 2012-10-08 (×2): via INTRAVENOUS

## 2012-10-08 MED ORDER — LABETALOL HCL 5 MG/ML IV SOLN
INTRAVENOUS | Status: DC | PRN
  Administered 2012-10-08: 5 mg via INTRAVENOUS

## 2012-10-08 MED ORDER — MUPIROCIN 2 % EX OINT
1.0000 "application " | TOPICAL_OINTMENT | Freq: Two times a day (BID) | CUTANEOUS | Status: DC
  Administered 2012-10-09 – 2012-10-10 (×2): 1 via TOPICAL
  Filled 2012-10-08: qty 22

## 2012-10-08 MED ORDER — DEXTROSE 5 % IV SOLN
10.0000 mg | INTRAVENOUS | Status: DC | PRN
  Administered 2012-10-08: 50 ug/min via INTRAVENOUS

## 2012-10-08 MED ORDER — LABETALOL HCL 5 MG/ML IV SOLN
10.0000 mg | INTRAVENOUS | Status: DC | PRN
  Filled 2012-10-08: qty 4

## 2012-10-08 MED ORDER — RANOLAZINE ER 500 MG PO TB12
500.0000 mg | ORAL_TABLET | Freq: Two times a day (BID) | ORAL | Status: DC
  Administered 2012-10-08 – 2012-10-13 (×10): 500 mg via ORAL
  Filled 2012-10-08 (×11): qty 1

## 2012-10-08 MED ORDER — CLOPIDOGREL BISULFATE 75 MG PO TABS
75.0000 mg | ORAL_TABLET | Freq: Every day | ORAL | Status: DC
  Administered 2012-10-09 – 2012-10-13 (×5): 75 mg via ORAL
  Filled 2012-10-08 (×6): qty 1

## 2012-10-08 MED ORDER — 0.9 % SODIUM CHLORIDE (POUR BTL) OPTIME
TOPICAL | Status: DC | PRN
  Administered 2012-10-08: 2000 mL

## 2012-10-08 MED ORDER — DOCUSATE SODIUM 100 MG PO CAPS
100.0000 mg | ORAL_CAPSULE | Freq: Every day | ORAL | Status: DC
  Administered 2012-10-09 – 2012-10-11 (×2): 100 mg via ORAL
  Filled 2012-10-08 (×6): qty 1

## 2012-10-08 MED ORDER — ROCURONIUM BROMIDE 100 MG/10ML IV SOLN
INTRAVENOUS | Status: DC | PRN
  Administered 2012-10-08: 30 mg via INTRAVENOUS

## 2012-10-08 MED ORDER — HYDROMORPHONE HCL PF 1 MG/ML IJ SOLN: 0.2500 mg | INTRAMUSCULAR | Status: DC | PRN

## 2012-10-08 MED ORDER — ONDANSETRON HCL 4 MG/2ML IJ SOLN: 4.0000 mg | Freq: Four times a day (QID) | INTRAMUSCULAR | Status: DC | PRN

## 2012-10-08 MED ORDER — ACETAMINOPHEN 650 MG RE SUPP: 325.0000 mg | RECTAL | Status: DC | PRN

## 2012-10-08 MED ORDER — DIPHENHYDRAMINE HCL 12.5 MG/5ML PO ELIX
12.5000 mg | ORAL_SOLUTION | Freq: Four times a day (QID) | ORAL | Status: DC | PRN
  Filled 2012-10-08: qty 5

## 2012-10-08 MED ORDER — GLYCOPYRROLATE 0.2 MG/ML IJ SOLN
INTRAMUSCULAR | Status: DC | PRN
  Administered 2012-10-08: 0.4 mg via INTRAVENOUS

## 2012-10-08 MED ORDER — SODIUM CHLORIDE 0.9 % IR SOLN
Status: DC | PRN
  Administered 2012-10-08: 10:00:00

## 2012-10-08 MED ORDER — ALBUMIN HUMAN 5 % IV SOLN
12.5000 g | Freq: Once | INTRAVENOUS | Status: AC
  Administered 2012-10-08: 12.5 g via INTRAVENOUS

## 2012-10-08 MED ORDER — LIDOCAINE HCL (CARDIAC) 20 MG/ML IV SOLN
INTRAVENOUS | Status: DC | PRN
  Administered 2012-10-08: 60 mg via INTRAVENOUS

## 2012-10-08 MED ORDER — LEVOTHYROXINE SODIUM 50 MCG PO TABS
50.0000 ug | ORAL_TABLET | Freq: Every day | ORAL | Status: DC
  Administered 2012-10-09 – 2012-10-13 (×5): 50 ug via ORAL
  Filled 2012-10-08 (×6): qty 1

## 2012-10-08 MED ORDER — ONDANSETRON HCL 4 MG/2ML IJ SOLN
INTRAMUSCULAR | Status: DC | PRN
  Administered 2012-10-08: 4 mg via INTRAVENOUS

## 2012-10-08 MED ORDER — METOPROLOL TARTRATE 25 MG PO TABS
25.0000 mg | ORAL_TABLET | Freq: Every day | ORAL | Status: DC
  Administered 2012-10-08 – 2012-10-12 (×5): 25 mg via ORAL
  Filled 2012-10-08 (×6): qty 1

## 2012-10-08 MED ORDER — LOSARTAN POTASSIUM 50 MG PO TABS: 100.0000 mg | ORAL_TABLET | Freq: Every day | ORAL | Status: DC

## 2012-10-08 MED ORDER — INSULIN ASPART 100 UNIT/ML ~~LOC~~ SOLN
0.0000 [IU] | Freq: Three times a day (TID) | SUBCUTANEOUS | Status: DC
  Administered 2012-10-08 – 2012-10-09 (×2): 2 [IU] via SUBCUTANEOUS

## 2012-10-08 MED ORDER — PROTAMINE SULFATE 10 MG/ML IV SOLN
INTRAVENOUS | Status: DC | PRN
  Administered 2012-10-08: 50 mg via INTRAVENOUS

## 2012-10-08 MED ORDER — SODIUM CHLORIDE 0.9 % IV SOLN: INTRAVENOUS | Status: DC

## 2012-10-08 MED ORDER — PAROXETINE HCL 20 MG PO TABS
20.0000 mg | ORAL_TABLET | Freq: Every morning | ORAL | Status: DC
  Administered 2012-10-09 – 2012-10-13 (×5): 20 mg via ORAL
  Filled 2012-10-08 (×5): qty 1

## 2012-10-08 SURGICAL SUPPLY — 59 items
BLADE SURG 15 STRL LF DISP TIS (BLADE) ×1 IMPLANT
BLADE SURG 15 STRL SS (BLADE) ×1
CANISTER SUCTION 2500CC (MISCELLANEOUS) ×2 IMPLANT
CATH EMB 4FR 80CM (CATHETERS) ×2 IMPLANT
CLIP TI MEDIUM 24 (CLIP) ×2 IMPLANT
CLIP TI WIDE RED SMALL 24 (CLIP) ×2 IMPLANT
CLOTH BEACON ORANGE TIMEOUT ST (SAFETY) ×2 IMPLANT
COVER SURGICAL LIGHT HANDLE (MISCELLANEOUS) ×2 IMPLANT
DERMABOND ADVANCED (GAUZE/BANDAGES/DRESSINGS) ×4
DERMABOND ADVANCED .7 DNX12 (GAUZE/BANDAGES/DRESSINGS) ×4 IMPLANT
DRAIN SNY 10X20 3/4 PERF (WOUND CARE) IMPLANT
DRAPE INCISE IOBAN 66X45 STRL (DRAPES) ×2 IMPLANT
DRAPE WARM FLUID 44X44 (DRAPE) ×2 IMPLANT
ELECT REM PT RETURN 9FT ADLT (ELECTROSURGICAL) ×2
ELECTRODE REM PT RTRN 9FT ADLT (ELECTROSURGICAL) ×1 IMPLANT
EVACUATOR SILICONE 100CC (DRAIN) IMPLANT
GLOVE BIOGEL PI IND STRL 6.5 (GLOVE) ×1 IMPLANT
GLOVE BIOGEL PI IND STRL 7.0 (GLOVE) ×1 IMPLANT
GLOVE BIOGEL PI IND STRL 7.5 (GLOVE) ×2 IMPLANT
GLOVE BIOGEL PI INDICATOR 6.5 (GLOVE) ×1
GLOVE BIOGEL PI INDICATOR 7.0 (GLOVE) ×1
GLOVE BIOGEL PI INDICATOR 7.5 (GLOVE) ×2
GLOVE ECLIPSE 6.0 STRL STRAW (GLOVE) ×2 IMPLANT
GLOVE SS BIOGEL STRL SZ 6.5 (GLOVE) ×2 IMPLANT
GLOVE SS BIOGEL STRL SZ 7 (GLOVE) ×1 IMPLANT
GLOVE SUPERSENSE BIOGEL SZ 6.5 (GLOVE) ×2
GLOVE SUPERSENSE BIOGEL SZ 7 (GLOVE) ×1
GLOVE SURG SS PI 7.5 STRL IVOR (GLOVE) ×4 IMPLANT
GOWN PREVENTION PLUS XLARGE (GOWN DISPOSABLE) ×4 IMPLANT
GOWN STRL NON-REIN LRG LVL3 (GOWN DISPOSABLE) ×6 IMPLANT
GRAFT CV 60X8STRG TUBE KNTD (Vascular Products) ×1 IMPLANT
GRAFT HEMASHIELD 8MM (Vascular Products) ×2 IMPLANT
GRAFT VASC STRG 30X8KNIT (Vascular Products) ×1 IMPLANT
INSERT FOGARTY SM (MISCELLANEOUS) ×4 IMPLANT
KIT BASIN OR (CUSTOM PROCEDURE TRAY) ×2 IMPLANT
KIT ROOM TURNOVER OR (KITS) ×2 IMPLANT
NS IRRIG 1000ML POUR BTL (IV SOLUTION) ×4 IMPLANT
PACK PERIPHERAL VASCULAR (CUSTOM PROCEDURE TRAY) ×2 IMPLANT
PAD ARMBOARD 7.5X6 YLW CONV (MISCELLANEOUS) ×4 IMPLANT
PUNCH AORTIC ROTATE 5MM 8IN (MISCELLANEOUS) ×2 IMPLANT
SPONGE GAUZE 4X4 12PLY (GAUZE/BANDAGES/DRESSINGS) IMPLANT
SPONGE LAP 18X18 X RAY DECT (DISPOSABLE) ×4 IMPLANT
STAPLER VISISTAT 35W (STAPLE) ×2 IMPLANT
SUT PROLENE 5 0 C 1 24 (SUTURE) ×4 IMPLANT
SUT PROLENE 5 0 CC1 (SUTURE) ×6 IMPLANT
SUT PROLENE 6 0 BV (SUTURE) ×2 IMPLANT
SUT PROLENE 6 0 CC (SUTURE) ×4 IMPLANT
SUT SILK 1 TIES 10X30 (SUTURE) ×2 IMPLANT
SUT SILK 2 0 SH (SUTURE) ×2 IMPLANT
SUT VIC AB 2-0 CTX 36 (SUTURE) ×6 IMPLANT
SUT VIC AB 3-0 SH 27 (SUTURE) ×3
SUT VIC AB 3-0 SH 27X BRD (SUTURE) ×3 IMPLANT
SUT VIC AB 4-0 PS2 27 (SUTURE) ×2 IMPLANT
SYR 3ML LL SCALE MARK (SYRINGE) ×2 IMPLANT
TAPE UMBILICAL COTTON 1/8X30 (MISCELLANEOUS) ×2 IMPLANT
TOWEL OR 17X24 6PK STRL BLUE (TOWEL DISPOSABLE) ×4 IMPLANT
TOWEL OR 17X26 10 PK STRL BLUE (TOWEL DISPOSABLE) ×4 IMPLANT
TRAY FOLEY CATH 14FRSI W/METER (CATHETERS) ×2 IMPLANT
WATER STERILE IRR 1000ML POUR (IV SOLUTION) ×2 IMPLANT

## 2012-10-08 NOTE — Interval H&P Note (Signed)
History and Physical Interval Note:  10/08/2012 8:20 AM  [name] Short  has presented today for surgery, with the diagnosis of Occluded AFBG  The various methods of treatment have been discussed with the patient and family. After consideration of risks, benefits and other options for treatment, the patient has consented to  Procedure(s): BYPASS GRAFT AXILLA-BIFEMORAL (Left) as a surgical intervention .  The patient's history has been reviewed, patient examined, no change in status, stable for surgery.  I have reviewed the patient's chart and labs.  Questions were answered to the patient's satisfaction.     Josephina Gip  Plan axillobifemoral bypass graft. We'll use right axillary artery as inflow. Either side could be utilized but have chosen right side.

## 2012-10-08 NOTE — Transfer of Care (Signed)
Immediate Anesthesia Transfer of Care Note  Patient: [name] Short  Procedure(s) Performed: Procedure(s): BYPASS GRAFT AXILLA-BIFEMORAL (Right)  Patient Location: PACU  Anesthesia Type:General  Level of Consciousness: awake, alert  and oriented  Airway & Oxygen Therapy: Patient Spontanous Breathing and Patient connected to nasal cannula oxygen  Post-op Assessment: Report given to PACU RN, Post -op Vital signs reviewed and stable and Patient moving all extremities  Post vital signs: Reviewed and stable  Complications: No apparent anesthesia complications

## 2012-10-08 NOTE — Preoperative (Signed)
Beta Blockers   Reason not to administer Beta Blockers:Not Applicable 

## 2012-10-08 NOTE — Telephone Encounter (Addendum)
Message copied by Rosalyn Charters on Wed Oct 08, 2012  4:46 PM ------      Message from: [name]      Created: Wed Oct 08, 2012 12:29 PM       2-3 week F/U ax-bifem bypass- [name]  notified patient of fu appt. with dr. [name] on 10-21-12 9:15 [name] Short ------

## 2012-10-08 NOTE — H&P (View-Only) (Signed)
Subjective:     Patient ID: [name] Short, female   DOB: [birth year], 79 y.o.   MRN: [id number]  HPI this 79 year old female returns to discuss revascularization scheduled next week. She had a CT angiogram of the abdomen and lower extremities performed yesterday which I have reviewed. She has total occlusion of her aortic graft which we suspected but does have patent superficial femoral and popliteal arteries bilaterally. She also had a duplex scan performed in our office on May 13 and ABIs performed. I've reviewed and interpreted these studies and the ABI is 0.42 on the right and 0.40 on the left. She has severe claudication being unable to walk across the room without having severe pain. She does not have rest pain however.  She was seen by cardiology and getting cardiac clearance. She did have a cardiac catheterization performed in January of this year and it was felt that she did not need any further cardiac testing prior to her axillobifemoral bypass graft.  Past Medical History  Diagnosis Date  . Diabetes mellitus without complication   . Hypertension   . Coronary artery disease   . Thyroid disease   . Depression   . Memory loss   . Skin cancer   . PVD (peripheral vascular disease)   . CHF (congestive heart failure)   . Atrial fibrillation   . Myocardial infarction   . Dyspnea 06/01/2012    2D Echo - EF 60-65, mitral valve calcified annulus, left atrium severely dilated, right atrium moderately dilated  . Chest pain 10/07/2009    2D Echo - EF 50-55, mitral valve calcified annulus  . CAD (coronary artery disease) 03/01/2008    Lexiscan - EF 79, no ECG changes/EKG negative for ischemia, post-stress EF 86  . Cerebral atherosclerosis 10/15/2011    Extracranial Examination - mild-moderate amount smooth mixed density plaque elevating velocities within the proximal and mid segments of the internal carotid artery  . Claudication 09/01/2010    LEA Doppler - Bilateral ABIs-demonstrated moderate  arterial insufficiency to the lower extremities at rest, Right CIA/EIA-known occlusive disease, PV Cath  . Anginal pain     not in in last month  . Dementia   . GERD (gastroesophageal reflux disease)     takes prevacid  . Arthritis     History  Substance Use Topics  . Smoking status: Former Smoker    Quit date: 05/15/1991  . Smokeless tobacco: Never Used  . Alcohol Use: No    Family History  Problem Relation Age of Onset  . Heart disease Father   . Hypertension Father   . Heart attack Father   . Kidney failure Brother   . Hyperlipidemia Daughter     Allergies  Allergen Reactions  . Codeine Itching    Current outpatient prescriptions:acetaminophen (TYLENOL) 325 MG tablet, Take 650 mg by mouth every 4 (four) hours as needed. For pain, Disp: , Rfl: ;  amLODipine (NORVASC) 5 MG tablet, Take 5 mg by mouth daily., Disp: , Rfl: ;  aspirin 81 MG chewable tablet, Chew 81 mg by mouth daily., Disp: , Rfl: ;  atorvastatin (LIPITOR) 40 MG tablet, Take 40 mg by mouth daily., Disp: , Rfl:  cholecalciferol (VITAMIN D) 1000 UNITS tablet, Take 1,000 Units by mouth daily., Disp: , Rfl: ;  clopidogrel (PLAVIX) 75 MG tablet, Take 75 mg by mouth daily., Disp: , Rfl: ;  diazepam (VALIUM) 5 MG tablet, Take 5 mg by mouth 2 (two) times daily., Disp: , Rfl: ;  diltiazem [facility])  240 MG 24 hr capsule, Take 240 mg by mouth daily., Disp: , Rfl:  diphenhydrAMINE (BENADRYL) 12.5 MG/5ML elixir, Take 12.5 mg by mouth as needed. For allergic reaction to norco, Disp: , Rfl: ;  docusate sodium (COLACE) 100 MG capsule, Take 100 mg by mouth at bedtime., Disp: , Rfl: ;  donepezil (ARICEPT) 5 MG tablet, Take 5 mg by mouth daily., Disp: , Rfl: ;  glimepiride (AMARYL) 2 MG tablet, Take 2 mg by mouth daily before breakfast., Disp: , Rfl:  guaiFENesin (ROBITUSSIN) 100 MG/5ML SOLN, Take 10 mLs by mouth every 4 (four) hours while awake., Disp: , Rfl: ;  HYDROcodone-acetaminophen (NORCO/VICODIN) 5-325 MG per tablet, Take 1 tablet  by mouth every 6 (six) hours as needed. For pain, Disp: , Rfl: ;  isosorbide mononitrate (IMDUR) 30 MG 24 hr tablet, Take 1 tablet (30 mg total) by mouth daily. Take at night, Disp: 30 tablet, Rfl: 5 isosorbide mononitrate (IMDUR) 60 MG 24 hr tablet, Take 60 mg by mouth daily., Disp: , Rfl: ;  lansoprazole (PREVACID) 30 MG capsule, Take 30 mg by mouth 2 (two) times daily., Disp: , Rfl: ;  levothyroxine (SYNTHROID, LEVOTHROID) 88 MCG tablet, Take 88 mcg by mouth daily., Disp: , Rfl: ;  losartan (COZAAR) 100 MG tablet, Take 100 mg by mouth daily., Disp: , Rfl: ;  Melatonin 3 MG TABS, Take 3 mg by mouth at bedtime., Disp: , Rfl:  metoprolol (LOPRESSOR) 50 MG tablet, Take 50 mg by mouth daily. Daily at 0800, Disp: , Rfl: ;  metoprolol tartrate (LOPRESSOR) 25 MG tablet, Take 12.5 mg by mouth daily at 8 pm. , Disp: , Rfl: ;  Multiple Vitamin (MULTIVITAMIN WITH MINERALS) TABS, Take 1 tablet by mouth daily., Disp: , Rfl: ;  nitroGLYCERIN (NITROSTAT) 0.4 MG SL tablet, Place 0.4 mg under the tongue every 5 (five) minutes as needed. For chest pain, Disp: , Rfl:  omega-3 acid ethyl esters (LOVAZA) 1 G capsule, Take 2 g by mouth daily., Disp: , Rfl: ;  PARoxetine (PAXIL) 20 MG tablet, Take 20 mg by mouth every morning., Disp: , Rfl: ;  polyethylene glycol (MIRALAX / GLYCOLAX) packet, Take 17 g by mouth daily as needed. For constipation, Disp: , Rfl: ;  ranolazine (RANEXA) 500 MG 12 hr tablet, Take 1 tablet (500 mg total) by mouth 2 (two) times daily., Disp: 60 tablet, Rfl: 5  BP 122/61  Pulse 92  Ht 5' (1.524 m)  Wt 139 lb (63.05 kg)  BMI 27.15 kg/m2  SpO2 96%  Body mass index is 27.15 kg/(m^2).           Review of Systems     Objective:   Physical Exam blood pressure 122/61 heart rate 92 respirations 16 Gen.-alert and oriented x3 in no apparent distress HEENT normal for age Lungs no rhonchi or wheezing Cardiovascular regular rhythm no murmurs carotid pulses 3+ palpable no bruits audible Abdomen  soft nontender no palpable masses Musculoskeletal free of  major deformities Skin clear -no rashes Neurologic normal Lower extremities-absent femoral and distal pulses bilaterally with no evidence of ischemia or infection in either lower extremity      Assessment:     Severe lower trinity occlusive disease with occlusion of aortobifemoral bypass graft placed by me in 1994 with severe claudication    Plan:     Right axillofemoral and right-to-left femoral-femoral bypass to be done on Wednesday, May 28 Will hold Plavix for next 5 days to decrease incidence of bleeding at time of  surgery and then resume postop Risks and benefits thoroughly discussed with patient and she would like to proceed

## 2012-10-08 NOTE — Op Note (Signed)
OPERATIVE REPORT  Date of Surgery: 10/08/2012  Surgeon: Josephina Gip, MD  Assistant: Clearence Ped  Pre-op Diagnosis: Occluded AFBG and left to right femoral-femoral bypass with severe claudication and rest ischemia Post-op Diagnosis  same Procedure: Procedure(s): #1 right axillary femoral bypass using 8 mm Hemashield Dacron graft #2 right to left femoral femoral bypass using 8 mm Hemashield Dacron graft  Anesthesia: General  EBL: 400 cc  Complications: None  Patient was taken to the operating room placed in supine position at which time satisfactory general endotracheal anesthesia was administered. Arterial line was placed in the left radial artery by anesthesia. The right upper chest and neck in both inguinal areas were prepped with Betadine scrub and solution draped in a routine sterile manner. An infraclavicular incision was made on the right side carried down through subcutaneous tissue between the fibers of the pectoralis major muscle. The pectoralis minor muscle was divided with the Bovie. Axillary artery was exposed adjacent to the brachial plexus and injury to the brachial plexus was carefully avoided. Axillary artery had an excellent pulse was encircled with Vesseloops. Attention turned to the inguinal areas were longitudinal incisions were made in both groins through previous scars where a previous aortobifemoral bypass and femoral-femoral bypass had been placed over the past 20 years. There was dense scar tissue in both groins. The femoral-femoral graft was dissected free and the common femoral artery was exposed down to the origin including the superficial femoral and profunda on both sides. The femoral-femoral graft was pulseless. Proximal control of the inguinal ligament level was obtained bilaterally. A new suprapubic tunnel was created adjacent to the old femoral-femoral graft and a tunnel between the right axillary wound in the right inguinal wound was then created using a  Menton tunnel or deep to the pectoralis minor muscle. An 8 mm Hemashield background graft was delivered through this tunnel the patient was then heparinized. Axillary artery was occluded proximally and distally a 15 blade extended with the Potts scissors. It had excellent inflow. The 8 mm graft was very slightly spatulated and anastomosed end-to-side with 6-0 Prolene. Following this the clamps were released. Attention turned to the right inguinal area where the proximal common femoral and the superficial and profunda femoris arteries were occluded with vascular clamps. The old femoral-femoral graft was excised leaving a very small cuff of Decadron on the common femoral artery. Thrombus was removed from the right common femoral artery. Right ankle would easily open the origin of the right superficial femoral and profunda and a Fogarty catheter was passed down to the ankle level with no thrombus retrieved. There was adequate backbleeding. The distal end of the right axillofemoral graft was then spatulated and fashioned appropriately and sewn into the common femoral artery with 5-0 Prolene following completion of this the 8 mm graft utilized for the femoral-femoral graft was tunneled suprapubically. A site for anastomosis to the hood of the right femoral graft it was displaced was identified opened with 15 blade and enlarged with a 5 mm punch creating an oval opening. The femoral-femoral graft slightly spatulated and anastomosed end-to-side with 5-0 Prolene. Following this the left femoral vessels were occluded with vascular clamps and the left limb of the femoral-femoral graft was excised leaving a small rim on the common femoral artery. There was good backbleeding. The arthrotomy was extended on the left side down into the origin of the superficial femoral artery. Graft was fashioned appropriately and anastomosed end-to-side with 5-0 Prolene. Clamps were then released and there was excellent  pulse in both limbs and  good Doppler flow in both superficial femoral and profunda femoris arteries. Protamine was then given to reverse the heparin the wounds were closed in layers with Vicryl in a subcuticular fashion Dermabond patient taken to the recovery room in stable condition  Procedure Details:   Josephina Gip, MD 10/08/2012 12:44 PM

## 2012-10-08 NOTE — Anesthesia Preprocedure Evaluation (Addendum)
Anesthesia Evaluation  Patient identified by MRN, date of birth, ID band Patient awake    Reviewed: Allergy & Precautions, H&P , NPO status , Patient's Chart, lab work & pertinent test results  Airway Mallampati: I TM Distance: >3 FB Neck ROM: full    Dental  (+) Partial Upper and Dental Advisory Given   Pulmonary          Cardiovascular hypertension, Pt. on medications + angina + CAD, + Past MI, + Peripheral Vascular Disease and +CHF + dysrhythmias Atrial Fibrillation Rhythm:irregular Rate:Normal     Neuro/Psych    GI/Hepatic GERD-  ,  Endo/Other  diabetes, Type 2, Oral Hypoglycemic Agents  Renal/GU      Musculoskeletal   Abdominal   Peds  Hematology   Anesthesia Other Findings   Reproductive/Obstetrics                          Anesthesia Physical Anesthesia Plan  ASA: IV  Anesthesia Plan: General   Post-op Pain Management:    Induction: Intravenous  Airway Management Planned: Oral ETT  Additional Equipment: Arterial line  Intra-op Plan:   Post-operative Plan: Extubation in OR  Informed Consent: I have reviewed the patients History and Physical, chart, labs and discussed the procedure including the risks, benefits and alternatives for the proposed anesthesia with the patient or authorized representative who has indicated his/her understanding and acceptance.     Plan Discussed with: CRNA, Anesthesiologist and Surgeon  Anesthesia Plan Comments:         Anesthesia Quick Evaluation

## 2012-10-08 NOTE — Anesthesia Procedure Notes (Signed)
Procedure Name: Intubation Date/Time: 10/08/2012 8:37 AM Performed by: Orvilla Fus A Pre-anesthesia Checklist: Patient identified, Timeout performed, Emergency Drugs available, Suction available and Patient being monitored Patient Re-evaluated:Patient Re-evaluated prior to inductionOxygen Delivery Method: Circle system utilized Preoxygenation: Pre-oxygenation with 100% oxygen Intubation Type: IV induction Ventilation: Mask ventilation without difficulty and Oral airway inserted - appropriate to patient size Laryngoscope Size: Mac and 3 Grade View: Grade I Tube type: Oral Tube size: 7.0 mm Number of attempts: 1 Airway Equipment and Method: Stylet and LTA kit utilized Placement Confirmation: ETT inserted through vocal cords under direct vision,  breath sounds checked- equal and bilateral and positive ETCO2 Secured at: 21 cm Tube secured with: Tape Dental Injury: Teeth and Oropharynx as per pre-operative assessment

## 2012-10-08 NOTE — Progress Notes (Signed)
Nurse called [facility] [phone]) and spoke with Nurse to inquire about medication administration. Staff at Assisted Living stated that patient had all her bedtime medications last night before bed. Patient informed Nurse that Bactroban ointment was started on 10/02/12 and was given twice a day for 5 days, however PCR showed negative results in EPIC. Patient denied taking any medications today. Daughter at bedside. Patient preparing to go to holding area.

## 2012-10-09 LAB — BASIC METABOLIC PANEL
BUN: 11 mg/dL (ref 6–23)
Creatinine, Ser: 0.74 mg/dL (ref 0.50–1.10)
GFR calc Af Amer: 89 mL/min — ABNORMAL LOW (ref >90)
GFR calc non Af Amer: 77 mL/min — ABNORMAL LOW (ref >90)
Glucose, Bld: 134 mg/dL — ABNORMAL HIGH (ref 70–99)

## 2012-10-09 LAB — TYPE AND SCREEN
Antibody Screen: NEGATIVE
Unit division: 0
Unit division: 0

## 2012-10-09 LAB — GLUCOSE, CAPILLARY
Glucose-Capillary: 157 mg/dL — ABNORMAL HIGH (ref 70–99)
Glucose-Capillary: 128 mg/dL — ABNORMAL HIGH (ref 70–99)
Glucose-Capillary: 87 mg/dL (ref 70–99)

## 2012-10-09 LAB — CBC
HCT: 31.2 % — ABNORMAL LOW (ref 36.0–46.0)
MCH: 29.4 pg (ref 26.0–34.0)
MCHC: 34.9 g/dL (ref 30.0–36.0)
MCV: 84.1 fL (ref 78.0–100.0)
RDW: 15 % (ref 11.5–15.5)

## 2012-10-09 NOTE — Anesthesia Postprocedure Evaluation (Signed)
  Anesthesia Post-op Note  Patient: [name] Short  Procedure(s) Performed: Procedure(s): BYPASS GRAFT AXILLA-BIFEMORAL (Right)  Patient Location: PACU  Anesthesia Type:General  Level of Consciousness: awake, oriented and patient cooperative  Airway and Oxygen Therapy: Patient Spontanous Breathing  Post-op Pain: mild  Post-op Assessment: Post-op Vital signs reviewed, Patient's Cardiovascular Status Stable, Respiratory Function Stable, Patent Airway, No signs of Nausea or vomiting and Pain level controlled  Post-op Vital Signs: stable  Complications: No apparent anesthesia complications

## 2012-10-09 NOTE — Evaluation (Signed)
Occupational Therapy Evaluation Patient Details Name: [name] Short MRN: [id number] DOB: 10-25-1927 Today's Date: 10/09/2012 Time: 2440-1027 OT Time Calculation (min): 23 min  OT Assessment / Plan / Recommendation Clinical Impression  Pt  admitted with occluded AFBG.  She is now s/p axillary-femoral and right to left fem-fem bypass.  Will continue to follow acutely in order to address below problem list.  Recommending ST SNF as safest d/c plan since pt is from ALF and has limited support/assist.  If pt unagreeable to SNF, then recommend HHOT.    OT Assessment  Patient needs continued OT Services    Follow Up Recommendations  SNF;Supervision/Assistance - 24 hour    Barriers to Discharge Decreased caregiver support    Equipment Recommendations  3 in 1 bedside comode    Recommendations for Other Services    Frequency  Min 2X/week    Precautions / Restrictions Precautions Precautions: Fall   Pertinent Vitals/Pain See vitals    ADL  Eating/Feeding: Performed;Independent Where Assessed - Eating/Feeding: Chair Grooming: Performed;Wash/dry hands;Set up Where Assessed - Grooming: Supported sitting Upper Body Bathing: Simulated;Set up Where Assessed - Upper Body Bathing: Supported sitting Lower Body Bathing: Simulated;Moderate assistance Where Assessed - Lower Body Bathing: Supported sit to stand Upper Body Dressing: Performed;Minimal assistance Where Assessed - Upper Body Dressing: Supported sitting Lower Body Dressing: Performed;Maximal assistance Where Assessed - Lower Body Dressing: Supported sit to Pharmacist, hospital: Performed;Minimal Dentist Method: Surveyor, minerals: Materials engineer and Hygiene: Performed;Minimal assistance Where Assessed - Engineer, mining and Hygiene: Sit to stand from 3-in-1 or toilet Equipment Used: Gait belt;Rolling walker Transfers/Ambulation Related to ADLs: Min  assist with RW during SPT bed<>3n1 and with ambulating ~5 ft from 3n1<>recliner.  Assist for balance due to occasional posterior lean (pt rocks back on heels). ADL Comments: Pt limited by pain.    OT Diagnosis: Generalized weakness;Acute pain  OT Problem List: Decreased strength;Decreased activity tolerance;Impaired balance (sitting and/or standing);Decreased knowledge of use of DME or AE;Pain OT Treatment Interventions: Self-care/ADL training;DME and/or AE instruction;Therapeutic activities;Patient/family education;Balance training   OT Goals Acute Rehab OT Goals OT Goal Formulation: With patient Time For Goal Achievement: 10/16/12 Potential to Achieve Goals: Good ADL Goals Pt Will Perform Grooming: with modified independence;Standing at sink ADL Goal: Grooming - Progress: Goal set today Pt Will Perform Upper Body Bathing: with modified independence;Sitting, chair;Sitting, edge of bed ADL Goal: Upper Body Bathing - Progress: Goal set today Pt Will Perform Lower Body Bathing: with modified independence;Sit to stand from chair;Sit to stand from bed ADL Goal: Lower Body Bathing - Progress: Goal set today Pt Will Perform Upper Body Dressing: with modified independence;Sitting, chair;Sitting, bed ADL Goal: Upper Body Dressing - Progress: Goal set today Pt Will Perform Lower Body Dressing: with modified independence;Sit to stand from chair;Sit to stand from bed ADL Goal: Lower Body Dressing - Progress: Goal set today Pt Will Transfer to Toilet: with supervision;Ambulation;with DME;Comfort height toilet ADL Goal: Toilet Transfer - Progress: Goal set today Pt Will Perform Toileting - Clothing Manipulation: with modified independence;Sitting on 3-in-1 or toilet;Standing ADL Goal: Toileting - Clothing Manipulation - Progress: Goal set today Pt Will Perform Toileting - Hygiene: with modified independence;Sit to stand from 3-in-1/toilet ADL Goal: Toileting - Hygiene - Progress: Goal set  today  Visit Information  Last OT Received On: 10/09/12 Assistance Needed: +1    Subjective Data      Prior Functioning     Home Living Lives With: Alone Available  Help at Discharge: Family;Available PRN/intermittently Type of Home: Assisted living Home Access: Level entry Home Layout: One level Home Adaptive Equipment: Wheelchair - manual;Walker - rolling Prior Function Level of Independence: Independent with assistive device(s) Able to Take Stairs?: No Driving: No Comments: Has recently been using w/c more frequently (past 2 weeks).  Communication Communication: No difficulties Dominant Hand: Right         Vision/Perception     Cognition  Cognition Arousal/Alertness: Awake/alert Behavior During Therapy: WFL for tasks assessed/performed Overall Cognitive Status: History of cognitive impairments - at baseline (memory deficits)    Extremity/Trunk Assessment Right Upper Extremity Assessment RUE ROM/Strength/Tone: Unable to fully assess;Due to pain Left Upper Extremity Assessment LUE ROM/Strength/Tone: Deficits LUE ROM/Strength/Tone Deficits: 3+/5, generally weak Right Lower Extremity Assessment RLE ROM/Strength/Tone: Due to pain;Unable to fully assess;Deficits RLE ROM/Strength/Tone Deficits: lifts antigravity in limited ROM with pain, strength not formally assessed Left Lower Extremity Assessment LLE ROM/Strength/Tone: Unable to fully assess;Due to pain;Deficits LLE ROM/Strength/Tone Deficits: lifts antigravity in limited ROM with pain, strength not formally assessed Trunk Assessment Trunk Assessment: Kyphotic     Mobility Bed Mobility Bed Mobility: Supine to Sit Supine to Sit: 3: Mod assist Details for Bed Mobility Assistance: Assist to support trunk OOB. Transfers Transfers: Stand to Sit;Sit to Stand Sit to Stand: 4: Min assist;From bed;From chair/3-in-1;With upper extremity assist;With armrests Stand to Sit: 4: Min assist;To chair/3-in-1;With  armrests;With upper extremity assist Details for Transfer Assistance: VCs for safe hand placement. Assist for steadying .     Exercise     Balance Balance Balance Assessed: Yes Static Sitting Balance Static Sitting - Balance Support: Feet unsupported;Bilateral upper extremity supported Static Sitting - Level of Assistance: 5: Stand by assistance   End of Session OT - End of Session Equipment Utilized During Treatment: Gait belt Activity Tolerance: Patient tolerated treatment well Patient left: in chair;with call bell/phone within reach Nurse Communication: Mobility status;Other (comment) (pt able to void in 3n1 after foley pulled by RN)  GO    10/09/2012 Cipriano Mile OTR/L Pager [phone] Office [phone]  Cipriano Mile 10/09/2012, 4:44 PM

## 2012-10-09 NOTE — Progress Notes (Signed)
Pt transferred to [id number] per MD order. Report called to receiving nurse and all questions answered.

## 2012-10-09 NOTE — Evaluation (Signed)
Physical Therapy Evaluation Patient Details Name: [name] Short MRN: [id number] DOB: 02-Sep-1927 Today's Date: 10/09/2012 Time: 1478-2956 PT Time Calculation (min): 27 min  PT Assessment / Plan / Recommendation Clinical Impression  Patient is an 79 y/o female admitted with occluded AFBG.  She is now s/p axillary-femoral and right to left fem-fem bypass.  She presents with decreased mobility due to pain, limited strength and ROM in LE's, decreased balance, decreased safety awareness and high fall risk with history of falls.  Feel best and safest d/c plan is STSNF.  Pt. not currently agreeable feels HHPT at facility can provide 5 days a week therapy and daughter can assist.  Will follow acutely and update recommendations if able.    PT Assessment  Patient needs continued PT services    Follow Up Recommendations  SNF;Supervision/Assistance - 24 hour    Does the patient have the potential to tolerate intense rehabilitation      Barriers to Discharge Decreased caregiver support      Equipment Recommendations    None   Recommendations for Other Services  None   Frequency Min 3X/week    Precautions / Restrictions Precautions Precautions: Fall   Pertinent Vitals/Pain Pain in leg and axilla unrated      Mobility  Bed Mobility Bed Mobility: Supine to Sit Supine to Sit: 3: Mod assist Transfers Transfers: Sit to Stand;Stand to Sit;Stand Pivot Transfers Sit to Stand: 4: Min assist;From bed;From chair/3-in-1 Stand to Sit: 4: Min assist;With upper extremity assist;To chair/3-in-1 Stand Pivot Transfers: 4: Min assist Details for Transfer Assistance: pivot with walker to 3:1, then to recliner with walker Ambulation/Gait Ambulation/Gait Assistance: Not tested (comment) (due to pain and reports not feeling well)        PT Diagnosis: Difficulty walking;Acute pain;Generalized weakness;Abnormality of gait  PT Problem List: Decreased strength;Decreased range of motion;Decreased  balance;Decreased mobility;Decreased activity tolerance;Pain;Decreased knowledge of use of DME PT Treatment Interventions: DME instruction;Gait training;Functional mobility training;Therapeutic activities;Therapeutic exercise;Patient/family education;Balance training   PT Goals Acute Rehab PT Goals PT Goal Formulation: With patient Time For Goal Achievement: 10/23/12 Potential to Achieve Goals: Good Pt will go Supine/Side to Sit: with supervision PT Goal: Supine/Side to Sit - Progress: Goal set today Pt will go Sit to Supine/Side: with supervision PT Goal: Sit to Supine/Side - Progress: Goal set today Pt will go Sit to Stand: with modified independence PT Goal: Sit to Stand - Progress: Goal set today Pt will go Stand to Sit: with modified independence;with upper extremity assist PT Goal: Stand to Sit - Progress: Goal set today Pt will Stand: with supervision;1 - 2 min;with unilateral upper extremity support PT Goal: Stand - Progress: Goal set today Pt will Ambulate: 51 - 150 feet;with rolling walker;with supervision PT Goal: Ambulate - Progress: Goal set today Pt will Perform Home Exercise Program: with supervision, verbal cues required/provided PT Goal: Perform Home Exercise Program - Progress: Goal set today  Visit Information  Last PT Received On: 10/09/12 Assistance Needed: +1 PT/OT Co-Evaluation/Treatment: Yes    Subjective Data  Subjective: I was up earlier.  Don't feel that well today. Patient Stated Goal: To return home with daughter assist and HHPT at ALF   Prior Functioning  Home Living Lives With: Alone Available Help at Discharge: Family;Available PRN/intermittently Type of Home: Assisted living Home Access: Level entry Home Layout: One level Home Adaptive Equipment: Wheelchair - manual;Walker - rolling Prior Function Level of Independence: Independent with assistive device(s) Able to Take Stairs?: No Driving: No Communication Communication: No difficulties  Cognition  Cognition Arousal/Alertness: Awake/alert Behavior During Therapy: WFL for tasks assessed/performed Overall Cognitive Status: History of cognitive impairments - at baseline (memory deficits)    Extremity/Trunk Assessment Right Lower Extremity Assessment RLE ROM/Strength/Tone: Due to pain;Unable to fully assess;Deficits RLE ROM/Strength/Tone Deficits: lifts antigravity in limited ROM with pain, strength not formally assessed Left Lower Extremity Assessment LLE ROM/Strength/Tone: Unable to fully assess;Due to pain;Deficits LLE ROM/Strength/Tone Deficits: lifts antigravity in limited ROM with pain, strength not formally assessed Trunk Assessment Trunk Assessment: Kyphotic   Balance Balance Balance Assessed: Yes Static Sitting Balance Static Sitting - Balance Support: Bilateral upper extremity supported;Feet unsupported Static Sitting - Level of Assistance: 5: Stand by assistance  End of Session PT - End of Session Equipment Utilized During Treatment: Gait belt Activity Tolerance: Patient limited by pain Patient left: in chair;with call bell/phone within reach Nurse Communication: Mobility status  GP     [facility] 10/09/2012, 4:00 PM [place], [place] [phone] 10/09/2012

## 2012-10-09 NOTE — Progress Notes (Signed)
Utilization review completed.  P.J. Amana Bouska,RN,BSN Case Manager [phone]  

## 2012-10-09 NOTE — Progress Notes (Signed)
Patient ID: [name] Short, female   DOB: 1928-05-05, 79 y.o.   MRN: [id number] Vascular Surgery Progress Note  Subjective: 1 day post right axillofemoral and right-to-left femoral-femoral bypass for ischemia of right lower extremities with occluded previous aortobifemoral graft and left to right femoral-femoral graft. Patient states he feels much better with no pain or numbness at rest. She has been out of bed and chair this morning. She states her appetite is improving in no nausea or vomiting has been reported.  Objective:  Filed Vitals:   10/09/12 0841  BP: 100/47  Pulse: 95  Temp: 98.3 F (36.8 C)  Resp: 18    General alert and oriented x3 in no apparent distress Lungs rhonchi or wheezing Cardiovascular irregular rhythm with no murmurs Incision in right infraclavicular area and both inguinal areas are all healing satisfactorily with no evidence hematoma. Excellent Doppler flow in both the posterior tibial and dorsalis pedis arteries. Both feet pink and well perfused.   Labs:  Recent Labs Lab 10/02/12 1533 10/08/12 2250 10/09/12 0520  CREATININE 0.93 0.72 0.74    Recent Labs Lab 10/02/12 1533 10/08/12 2250 10/09/12 0520  NA 137  --  132*  K 4.7  --  3.7  CL 96  --  101  CO2 27  --  20  BUN 11  --  11  CREATININE 0.93 0.72 0.74  GLUCOSE 163*  --  134*  CALCIUM 9.5  --  7.7*    Recent Labs Lab 10/02/12 1533 10/08/12 2250 10/09/12 0520  WBC 8.5 11.0* 10.4  HGB 14.7 11.8* 10.9*  HCT 42.5 32.5* 31.2*  PLT 253 129* 131*    Recent Labs Lab 10/02/12 1533  INR 1.03    I/O last 3 completed shifts: In: 5428 [P.O.:240; I.V.:4525; Blood:613; IV Piggyback:50] Out: 1630 [Urine:1430; Blood:200]  Imaging: No results found.  Assessment/Plan:  POD #1  LOS: 1 day  s/p Procedure(s): BYPASS GRAFT AXILLA-BIFEMORAL  Generally doing very well post right axillofemoral and right-to-left femoral-femoral bypass for ischemia of lower extremities.  Plan #1 transfer to 2000   #2 begin ambulation and mobilization #3 DC Foley catheter #4 patient will be returning to assisted living following discharge. Hopefully this can occur in the next 3 days    Josephina Gip, MD 10/09/2012 10:24 AM

## 2012-10-10 ENCOUNTER — Encounter (HOSPITAL_COMMUNITY): Payer: Self-pay | Admitting: Vascular Surgery

## 2012-10-10 DIAGNOSIS — Z48812 Encounter for surgical aftercare following surgery on the circulatory system: Secondary | ICD-10-CM

## 2012-10-10 LAB — GLUCOSE, CAPILLARY
Glucose-Capillary: 93 mg/dL (ref 70–99)
Glucose-Capillary: 69 mg/dL — ABNORMAL LOW (ref 70–99)

## 2012-10-10 NOTE — Progress Notes (Signed)
VASCULAR LAB PRELIMINARY  ARTERIAL  ABI completed:    RIGHT    LEFT    PRESSURE WAVEFORM  PRESSURE WAVEFORM  BRACHIAL 101 Triphasic BRACHIAL 141 Triphasic  DP 100 Biphasic DP 90 Biphasic         PT 107 Biphasic PT 103 Biphasic                  RIGHT LEFT  ABI 0.76 0.73   Post operative ABIs on the right indicate a mild to moderate reduction in arterial flow. Left post operative  ABIs indicate a moderate reduction in artery flow.  Nithila Sumners, RVS 10/10/2012, 2:43 PM

## 2012-10-10 NOTE — Clinical Social Work Note (Signed)
Clinical Social Work Department BRIEF PSYCHOSOCIAL ASSESSMENT 10/10/2012  Patient:  [name]     Account Number:  [id number]     Admit date:  10/08/2012  Clinical Social Worker:  Verl Blalock  Date/Time:  10/10/2012 04:00 PM  Referred by:  Care Management  Date Referred:  10/10/2012 Referred for  ALF Placement   Other Referral:   Interview type:  Patient Other interview type:    PSYCHOSOCIAL DATA Living Status:  FACILITY Admitted from facility:  [facility] [address] Level of care:  Assisted Living Primary support name:  [name] Short  [phone] Primary support relationship to patient:  CHILD, ADULT Degree of support available:   Strong    CURRENT CONCERNS Current Concerns  Post-Acute Placement   Other Concerns:    SOCIAL WORK ASSESSMENT / PLAN Clinical Social Worker met with patient at bedside to offer support and discuss patient needs at discharge.  Patient states that she is from [facility] and would prefer to return at discharge.  PT/OT recommending SNF placement at discharge, however patient is not agreeable at this time.  CSW spoke with Onalee Hua, RN at [facility] who states that he has made arrangements for patient to return to [facility] at discharge. Patient will need to be set up with [facility] or [facility] for HHPT/OT at discharge - CSW to notify CM.  CSW remains available for support and to facilitate patient discharge needs once medically ready.   Assessment/plan status:  Psychosocial Support/Ongoing Assessment of Needs Other assessment/ plan:   Information/referral to community resources:   Patient states that she is not in need of further resources at this time.  CM to arrange patient home health needs at discharge.    PATIENT'S/FAMILY'S RESPONSE TO PLAN OF CARE: Patient alert and oriented x3 laying in the bed.  Patient adamantly refusing SNF but agreeable to return to [facility]  with home health following.  Facility in agreement.  Patient very pleasant and willing to engage in CSW assessment process.  Patient verbalized her appreciation for CSW support and plans to update family when they visit.   [name] Short, [place] [phone]

## 2012-10-10 NOTE — Progress Notes (Signed)
[facility] Progress Note  10/10/2012 7:29 AM 2 Days Post-Op  Subjective:  "feeling better than yesterday"; states she still can't walk; c/o dizziness, but this is not new as she has had this prior to admission;   Tm 99.4 now afebrile HR 80's-120 irregular 100-150's systolic 92% RA  Filed Vitals:   10/10/12 0416  BP: 105/68  Pulse: 101  Temp: 98.8 F (37.1 C)  Resp: 18    Physical Exam: Incisions:  Bilateral groins and right infraclavicular incisions are soft; c/d/i Extremities:  Bilateral feet are warm and well perfused. Lungs:  Non-labored Cardiac:  irregular  CBC    Component Value Date/Time   WBC 10.4 10/09/2012 0520   RBC 3.71* 10/09/2012 0520   HGB 10.9* 10/09/2012 0520   HCT 31.2* 10/09/2012 0520   PLT 131* 10/09/2012 0520   MCV 84.1 10/09/2012 0520   MCH 29.4 10/09/2012 0520   MCHC 34.9 10/09/2012 0520   RDW 15.0 10/09/2012 0520   LYMPHSABS 1.4 09/16/2012 1730   MONOABS 0.9 09/16/2012 1730   EOSABS 0.1 09/16/2012 1730   BASOSABS 0.0 09/16/2012 1730    BMET    Component Value Date/Time   NA 132* 10/09/2012 0520   K 3.7 10/09/2012 0520   CL 101 10/09/2012 0520   CO2 20 10/09/2012 0520   GLUCOSE 134* 10/09/2012 0520   BUN 11 10/09/2012 0520   CREATININE 0.74 10/09/2012 0520   CALCIUM 7.7* 10/09/2012 0520   GFRNONAA 77* 10/09/2012 0520   GFRAA 89* 10/09/2012 0520    INR    Component Value Date/Time   INR 1.03 10/02/2012 1533     Intake/Output Summary (Last 24 hours) at 10/10/12 0729 Last data filed at 10/10/12 0416  Gross per 24 hour  Intake    980 ml  Output   1200 ml  Net   -220 ml     Assessment/Plan:  79 y.o. female is s/p:  #1 right axillary femoral bypass using 8 mm Hemashield Dacron graft  #2 right to left femoral femoral bypass using 8 mm Hemashield Dacron graft   2 Days Post-Op  -pre operative pain resolved -continue to mobilize and OOB -DVT prophylaxis:  On lovenox 40mg  qday  -hopefully back to SNF in a couple of days.  Will  get social work consult   Doreatha Massed, PA-C [facility] [phone] 10/10/2012 7:29 AM

## 2012-10-10 NOTE — Clinical Documentation Improvement (Signed)
Anemia Blood Loss Clarification  THIS DOCUMENT IS NOT A PERMANENT PART OF THE MEDICAL RECORD  RESPOND TO THE THIS QUERY, FOLLOW THE INSTRUCTIONS BELOW:  1. If needed, update documentation for the patient's encounter via the notes activity.  2. Access this query again and click edit on the In [employer].  3. After updating, or not, click F2 to complete all highlighted (required) fields concerning your review. Select "additional documentation in the medical record" OR "no additional documentation provided".  4. Click Sign note button.  5. The deficiency will fall out of your In Basket *Please let us know if you are not able to complete this workflow by phone or e-mail (listed below).        10/10/12  Dear Dr. [name] Marton Redwood  In an effort to better capture your patient's severity of illness, reflect appropriate length of stay and utilization of resources, a review of the patient medical record has revealed the following indicators.    Based on your clinical judgment, please clarify and document in a progress note and/or discharge summary the clinical condition associated with the following supporting information:  In responding to this query please exercise your independent judgment.  The fact that a query is asked, does not imply that any particular answer is desired or expected.    Possible Clinical Conditions?   " Expected Acute Blood Loss Anemia  " Acute Blood Loss Anemia  " Acute on chronic blood loss anemia  " Precipitous drop in Hematocrit  " Other Condition  " Cannot Clinically Determine    Risk Factors:  Per 5/28 Anesthesia record: EBL= .   Diagnostics: H&H on 5/22:  14.7/42.5 H&H on 5/29:   10.9/31.2  Treatments: Transfusion: Per doc flow sheets: intake of blood=  IV fluids / plasma expanders: Per 5/28 Anesthesia record: LR: LR:    Reviewed:No additional documentation provided.  Thank You,  Marciano Sequin,  Clinical Documentation Specialist:  Phone: [phone]  Health Information Management [place]

## 2012-10-11 LAB — GLUCOSE, CAPILLARY
Glucose-Capillary: 97 mg/dL (ref 70–99)
Glucose-Capillary: 92 mg/dL (ref 70–99)
Glucose-Capillary: 71 mg/dL (ref 70–99)
Glucose-Capillary: 117 mg/dL — ABNORMAL HIGH (ref 70–99)

## 2012-10-11 NOTE — Progress Notes (Addendum)
[facility] [place]  Progress Note Bypass Surgery  Date of Surgery: 10/08/2012  Procedure(s): BYPASS GRAFT AXILLA-BIFEMORAL Surgeon: Surgeon(s): Pryor Ochoa, MD  3 Days Post-Op  History of Present Illness  [name] Short is a 79 y.o. female who is S/P Procedure(s): BYPASS GRAFT AXILLA-BIFEMORAL  The patient's pre-op symptoms of pain in legs are Improved . Patients pain is well controlled.  C/O soreness in right side and superpubic area.  VASC. LAB Studies:        ABI: Right 0.76;  Left 0.73;   Imaging: No results found.  Significant Diagnostic Studies: CBC Lab Results  Component Value Date   WBC 10.4 10/09/2012   HGB 10.9* 10/09/2012   HCT 31.2* 10/09/2012   MCV 84.1 10/09/2012   PLT 131* 10/09/2012    BMET     Component Value Date/Time   NA 132* 10/09/2012 0520   K 3.7 10/09/2012 0520   CL 101 10/09/2012 0520   CO2 20 10/09/2012 0520   GLUCOSE 134* 10/09/2012 0520   BUN 11 10/09/2012 0520   CREATININE 0.74 10/09/2012 0520   CALCIUM 7.7* 10/09/2012 0520   GFRNONAA 77* 10/09/2012 0520   GFRAA 89* 10/09/2012 0520    COAG Lab Results  Component Value Date   INR 1.03 10/02/2012   INR 1.09 05/31/2012   INR 2.30* 03/07/2010   No results found for this basename: PTT    Physical Examination  BP Readings from Last 3 Encounters:  10/11/12 97/53  10/11/12 97/53  10/03/12 122/61   Temp Readings from Last 3 Encounters:  10/11/12 98.6 F (37 C) Oral  10/11/12 98.6 F (37 C) Oral  10/02/12 98.8 F (37.1 C)    SpO2 Readings from Last 3 Encounters:  10/11/12 93%  10/11/12 93%  10/03/12 96%   Pulse Readings from Last 3 Encounters:  10/11/12 93  10/11/12 93  10/03/12 92    Pt is A&O x 3 right upper, right lower, left upper extremity: Incision/s is/are clean,dry.intact, and  healing without hematoma, erythema or drainage Both Limbs is warm; with good color Palp right radial pulse ecchymosis minimal around incisions Mod ecchymosis under right  Breast sec to tunneling of graft  Right Dorsalis Pedis pulse is palpable  Left Dorsalis Pedis pulse is biphasic by Doppler Left Posterior tibial pulse is  biphasic by Doppler   Assessment/Plan: Pt. Doing well Post-op pain is fairly well controlled - still quite sore from tunneling in right flank and suprapubic area Wounds are healing well Ecchymosis around right breast from tunneling PT/OT for ambulation - ?CIR Pt wants to go back to assisted living where they have PT available Continue wound care as ordered  Short,[name] Short  10/11/2012 8:43 AM   I have examined the patient, reviewed and agree with above. The patient is comfortable. She has actually been up walking this morning and has done relatively well with this. Does have a palpable fem-fem and  Pulse. Groin and right axillary incisions are healing nicely. Continue to mobilize. Hopefully can return to assisted living in several days when ambulating better [name] Frady, MD 10/11/2012 10:19 AM

## 2012-10-12 LAB — GLUCOSE, CAPILLARY
Glucose-Capillary: 64 mg/dL — ABNORMAL LOW (ref 70–99)
Glucose-Capillary: 90 mg/dL (ref 70–99)

## 2012-10-12 NOTE — Progress Notes (Signed)
Patient ID: [name] Short, female   DOB: April 01, 1928, 79 y.o.   MRN: [id number] Less incisional soreness. There was difficulty in getting out of bed due to pain.  Afebrile vital signs are stable  right axillary in both femoral incisions are healing nicely and there is a palpable femoral to femoral graft pulse.  Impression and plan: Continued slow progress. Patient feels that she may be able to return to her assisted living facility tomorrow with limited help available there. We'll make this decision in the morning. Would rather not to skilled nursing facility

## 2012-10-13 DIAGNOSIS — R5381 Other malaise: Secondary | ICD-10-CM

## 2012-10-13 DIAGNOSIS — I70219 Atherosclerosis of native arteries of extremities with intermittent claudication, unspecified extremity: Secondary | ICD-10-CM

## 2012-10-13 LAB — GLUCOSE, CAPILLARY: Glucose-Capillary: 77 mg/dL (ref 70–99)

## 2012-10-13 MED ORDER — HYDROCODONE-ACETAMINOPHEN 5-325 MG PO TABS: 1.0000 | ORAL_TABLET | Freq: Four times a day (QID) | ORAL | Status: DC | PRN

## 2012-10-13 NOTE — Progress Notes (Signed)
Rehab admissions - Evaluated for possible admission.  Please see rehab consult done by Dr. Riley Kill.  Noted patient lacks medical necessity to support acute inpatient rehab stay.  Also, patient prefers to return to ALF where she resided PTA.  Call me for questions.  #[phone]

## 2012-10-13 NOTE — Discharge Summary (Addendum)
[facility] Discharge Summary   Patient ID:  [name] Short MRN: [id number] DOB/AGE: 11/07/1927 79 y.o.  Admit date: 10/08/2012 Discharge date: 10/13/2012 Date of Surgery: 10/08/2012 Surgeon: Surgeon(s): Pryor Ochoa, MD  Admission Diagnosis: Occluded AFBG  Discharge Diagnoses:  Occluded AFBG  Secondary Diagnoses: Past Medical History  Diagnosis Date  . Diabetes mellitus without complication   . Hypertension   . Coronary artery disease   . Thyroid disease   . Depression   . Memory loss   . Skin cancer   . PVD (peripheral vascular disease)   . CHF (congestive heart failure)   . Atrial fibrillation   . Myocardial infarction   . Dyspnea 06/01/2012    2D Echo - EF 60-65, mitral valve calcified annulus, left atrium severely dilated, right atrium moderately dilated  . Chest pain 10/07/2009    2D Echo - EF 50-55, mitral valve calcified annulus  . CAD (coronary artery disease) 03/01/2008    Lexiscan - EF 79, no ECG changes/EKG negative for ischemia, post-stress EF 86  . Cerebral atherosclerosis 10/15/2011    Extracranial Examination - mild-moderate amount smooth mixed density plaque elevating velocities within the proximal and mid segments of the internal carotid artery  . Claudication 09/01/2010    LEA Doppler - Bilateral ABIs-demonstrated moderate arterial insufficiency to the lower extremities at rest, Right CIA/EIA-known occlusive disease, PV Cath  . Anginal pain     not in in last month  . Dementia   . GERD (gastroesophageal reflux disease)     takes prevacid  . Arthritis     Procedure(s): BYPASS GRAFT AXILLA-BIFEMORAL  Discharged Condition: good  HPI: 79 year old female returns to discuss revascularization scheduled next week. She had a CT angiogram of the abdomen and lower extremities performed yesterday which I have reviewed. She has total occlusion of her aortic graft which we suspected but does have patent superficial femoral and popliteal arteries  bilaterally. She also had a duplex scan performed in our office on May 13 and ABIs performed. I've reviewed and interpreted these studies and the ABI is 0.42 on the right and 0.40 on the left. She has severe claudication being unable to walk across the room without having severe pain. She does not have rest pain however.  She was seen by cardiology and getting cardiac clearance. She did have a cardiac catheterization performed in January of this year and it was felt that she did not need any further cardiac testing prior to her axillobifemoral bypass graft.  On 10/08/2012 Dr. Phillips Grout performed right axillary right to left femoral femoral bypass.  Stable post-op day one and was transferred to 2000.  Her mobility was slow secondary to pain.  PT/OT evaluations were performed 10/09/2012.  Through out her stay DVT prophylaxis with Lovenox 40 mg daily.  ABI's were done 10/10/2012   RIGHT  LEFT   ABI  0.76  0.73   She is being discharged back to her Assisted living facility today 10/13/2012.     Hospital Course:  [name] Short is a 79 y.o. female is S/P  Procedure(s): BYPASS GRAFT Right AXILLA-Right to left BIFEMORAL Extubated: POD # 0 Physical exam:  The patient feeling stronger today  Incisions are healing nicely  3+ pulse in axillobifemoral graft was well perfused lower extremities  Able to get in and out of bed without help and ambulate independently Post-op wounds clean, dry, intact or healing well Pt. Ambulating, voiding and taking PO diet without difficulty. Pt pain controlled with PO  pain meds. Labs as below Complications:none  Consults:     Significant Diagnostic Studies: CBC Lab Results  Component Value Date   WBC 10.4 10/09/2012   HGB 10.9* 10/09/2012   HCT 31.2* 10/09/2012   MCV 84.1 10/09/2012   PLT 131* 10/09/2012    BMET    Component Value Date/Time   NA 132* 10/09/2012 0520   K 3.7 10/09/2012 0520   CL 101 10/09/2012 0520   CO2 20 10/09/2012 0520   GLUCOSE 134* 10/09/2012 0520    BUN 11 10/09/2012 0520   CREATININE 0.74 10/09/2012 0520   CALCIUM 7.7* 10/09/2012 0520   GFRNONAA 77* 10/09/2012 0520   GFRAA 89* 10/09/2012 0520   COAG Lab Results  Component Value Date   INR 1.03 10/02/2012   INR 1.09 05/31/2012   INR 2.30* 03/07/2010     Disposition:  Discharge to :Nursing Home/Assited Living Discharge Orders   Future Appointments Provider Department Dept Phone   10/23/2012 11:30 AM Pryor Ochoa, MD [facility] [phone]   Future Orders Complete By Expires     Call MD for:  redness, tenderness, or signs of infection (pain, swelling, bleeding, redness, odor or green/yellow discharge around incision site)  As directed     Call MD for:  severe or increased pain, loss or decreased feeling  in affected limb(s)  As directed     Call MD for:  temperature >100.5  As directed     Discharge patient  As directed     Comments:      Discharge pt to assisted living facility.    Driving Restrictions  As directed     Comments:      No driving for 4 weeks    Increase activity slowly  As directed     Comments:      Walk with assistance use walker or cane as needed    Lifting restrictions  As directed     Comments:      No lifting for 6 weeks    May shower   As directed     Resume previous diet  As directed         Medication List    TAKE these medications       acetaminophen 325 MG tablet  Commonly known as:  TYLENOL  Take 650 mg by mouth every 4 (four) hours as needed for pain.     amLODipine 5 MG tablet  Commonly known as:  NORVASC  Take 5 mg by mouth daily.     aspirin 81 MG chewable tablet  Chew 81 mg by mouth daily.     atorvastatin 40 MG tablet  Commonly known as:  LIPITOR  Take 40 mg by mouth at bedtime.     cholecalciferol 1000 UNITS tablet  Commonly known as:  VITAMIN D  Take 1,000 Units by mouth daily.     clopidogrel 75 MG tablet  Commonly known as:  PLAVIX  Take 75 mg by mouth daily.     diazepam 5 MG  tablet  Commonly known as:  VALIUM  Take 5 mg by mouth 2 (two) times daily.     diphenhydrAMINE 12.5 MG/5ML elixir  Commonly known as:  BENADRYL  Take 12.5 mg by mouth as needed (for allergic reaction to Norco).     donepezil 5 MG tablet  Commonly known as:  ARICEPT  Take 5 mg by mouth daily.     glimepiride 2 MG tablet  Commonly known as:  AMARYL  Take 2 mg by mouth daily before breakfast.     HYDROcodone-acetaminophen 5-325 MG per tablet  Commonly known as:  NORCO/VICODIN  Take 1 tablet by mouth every 6 (six) hours as needed for pain.     isosorbide mononitrate 30 MG 24 hr tablet  Commonly known as:  IMDUR  Take 30 mg by mouth at bedtime.     lansoprazole 30 MG capsule  Commonly known as:  PREVACID  Take 30 mg by mouth 2 (two) times daily.     levothyroxine 50 MCG tablet  Commonly known as:  SYNTHROID, LEVOTHROID  Take 50 mcg by mouth daily before breakfast.     losartan 100 MG tablet  Commonly known as:  COZAAR  Take 100 mg by mouth daily.     Melatonin 3 MG Tabs  Take 3 mg by mouth at bedtime.     metoprolol tartrate 25 MG tablet  Commonly known as:  LOPRESSOR  Take 25 mg by mouth at bedtime.     mupirocin ointment 2 %  Commonly known as:  BACTROBAN  Apply 1 application topically 2 (two) times daily. Apply to inside of both nostrils for 5 days starting 10/02/12     nitroGLYCERIN 0.4 MG SL tablet  Commonly known as:  NITROSTAT  Place 0.4 mg under the tongue every 5 (five) minutes as needed for chest pain.     omega-3 acid ethyl esters 1 G capsule  Commonly known as:  LOVAZA  Take 2 g by mouth daily.     PARoxetine 20 MG tablet  Commonly known as:  PAXIL  Take 20 mg by mouth every morning.     ranolazine 500 MG 12 hr tablet  Commonly known as:  RANEXA  Take 1 tablet (500 mg total) by mouth 2 (two) times daily.       Verbal and written Discharge instructions given to the patient. Wound care per Discharge AVS     Follow-up Information   Follow up  with Josephina Gip, MD In 3 weeks. (office will arrange-sent)    Contact information:   [address] [place] [address] [phone]       Signed: Clinton Gallant [facility] 10/13/2012, 11:17 AM  Addendum: We will stop her Amaryl due to her hypoglycemic  episodes and her A1C  5.4           - For [employer] use --- Instructions: Press F2 to tab through selections.  Delete question if not applicable.   Post-op:  Time to Extubation: [x ] In OR, [ ]  <12 hrs, [ ]  12-24 hrs, [ ]  > 24 hrs Vasopressors: No  ICU Stay: 1 days  Transfusion: No  If yes, 0 units given New Arrhythmia: No Ipsilateral amputation: [x ] no, [ ]  Minor, [ ]  BKA, [ ]  AKA Discharge patency: [ ]  Primary, [ ]  Primary assisted, [ ]  Secondary, [ ]  Occluded Patency judged by: [ ]  Dopper only, [x ] Palpable graft pulse, [x ] Palpable distal pulse, [x ] ABI inc. > 0.15, [ ]  Duplex Discharge ABI: R 0.76, L 0.73 D/C Ambulatory Status: Ambulatory  Complications: Wound complication: [ x] No, [ ]  Superficial, [ ]  Return to OR  Graft infection: No  Leg ischemia/emboli: [x ] No, [ ]  Yes, no Surgery, [ ]  Yes, Surgery req., [ ]  Amputation If amputation: side: [ ]  R: [ ]  minor, [ ]  BKA, [ ]  AKA; [ ]  L: [ ]  Minor, [ ]  BKA, [ ]  AKA  MI: [x ] No, [ ]  Troponin only, [ ]  EKG or Clinical CHF: No Resp failure: [x ] none, [ ]  Pneumonia, [ ]  Ventilator Chg in renal function: [x ] none, [ ]  Inc. Cr > 0.5, [ ]  Temp. Dialysis, [ ]  Permanent dialysis Stroke: [x ] None, [ ]  Minor, [ ]  Major Return to OR: No  Reason for return to OR: [ ]  Bleeding, [ ]  Infection, [ ]  Thrombosis, [ ]  Revision  Discharge medications: Statin use:  Yes ASA use:  Yes Plavix use:  Yes Beta blocker use: Yes Coumadin use: No  for medical reason

## 2012-10-13 NOTE — Care Management Note (Signed)
    Page 1 of 2   10/13/2012     4:19:57 PM   CARE MANAGEMENT NOTE 10/13/2012  Patient:  [facility]   Account Number:  [id number]  Date Initiated:  10/13/2012  Documentation initiated by:  Chesley Valls  Subjective/Objective Assessment:   PT ADM S/P AX BIFEM BYPASS GRAFT ON 10/08/12.  PTA, PT RESIDES AT [facility].     Action/Plan:   WILL CONSULT CSW TO FACILITATE DC TO ALF WHEN MEDICALLY STABLE.  WILL LIKELY NEED HH FOLLOW UP AT ALF.   Anticipated DC Date:  10/13/2012   Anticipated DC Plan:  HOME W HOME HEALTH SERVICES  In-house referral  Clinical Social Worker      DC Planning Services  CM consult      [facility] Choice  HOME HEALTH   Choice offered to / List presented to:  C-1 Patient        HH arranged  HH-1 RN  HH-2 PT  HH-3 OT      [facility] agency  [facility]   Status of service:  Completed, signed off Medicare Important Message given?   (If response is "NO", the following Medicare IM given date fields will be blank) Date Medicare IM given:   Date Additional Medicare IM given:    Discharge Disposition:  HOME W HOME HEALTH SERVICES  Per UR Regulation:  Reviewed for med. necessity/level of care/duration of stay  If discussed at [place] Length of Stay Meetings, dates discussed:    Comments:  10/13/12 Kashay Cavenaugh,RN,BSN [phone] REFERRAL TO [facility], PER PT CHOICE.  START OF CARE 24-48H POST DC DATE.  DC TODAY TO ALF, PER CSW ARRANGEMENTS.

## 2012-10-13 NOTE — Consult Note (Signed)
Physical Medicine and Rehabilitation Consult Reason for Consult: Deconditioning/status post femoral-femoral left lower extremity bypass Referring Physician: Dr. [name]   HPI: [name] Short is a 79 y.o. right-handed female with history of hypertension, diabetes mellitus peripheral neuropathy and coronary artery disease. Patient resides in [facility]. assisted living facility Admitted 10/08/2012 with noted history of peripheral vascular disease and multiple revascularization procedures. Recent office workup per vascular surgery for increased rest pain lower extremity showed occluded AFBG and underwent femoral to femoral bypass for severe claudication 10/08/2012 per Dr. [name]. Placed on subcutaneous Lovenox for DVT prophylaxis. Patient remains on aspirin Plavix therapy as prior to admission. Postoperative pain management. Physical occupational therapy evaluations completed an ongoing with question to return to assisted living facility. M.D. is requested physical medicine rehabilitation consult to consider inpatient rehabilitation services.   Review of Systems  Cardiovascular: Positive for palpitations and leg swelling.  Gastrointestinal:       Reflux  Neurological: Positive for tingling.  Psychiatric/Behavioral: Positive for depression.  All other systems reviewed and are negative.   Past Medical History  Diagnosis Date  . Diabetes mellitus without complication   . Hypertension   . Coronary artery disease   . Thyroid disease   . Depression   . Memory loss   . Skin cancer   . PVD (peripheral vascular disease)   . CHF (congestive heart failure)   . Atrial fibrillation   . Myocardial infarction   . Dyspnea 06/01/2012    2D Echo - EF 60-65, mitral valve calcified annulus, left atrium severely dilated, right atrium moderately dilated  . Chest pain 10/07/2009    2D Echo - EF 50-55, mitral valve calcified annulus  . CAD (coronary artery disease) 03/01/2008    Lexiscan - EF 79, no ECG  changes/EKG negative for ischemia, post-stress EF 86  . Cerebral atherosclerosis 10/15/2011    Extracranial Examination - mild-moderate amount smooth mixed density plaque elevating velocities within the proximal and mid segments of the internal carotid artery  . Claudication 09/01/2010    LEA Doppler - Bilateral ABIs-demonstrated moderate arterial insufficiency to the lower extremities at rest, Right CIA/EIA-known occlusive disease, PV Cath  . Anginal pain     not in in last month  . Dementia   . GERD (gastroesophageal reflux disease)     takes prevacid  . Arthritis    Past Surgical History  Procedure Laterality Date  . Abdominal hysterectomy    . Aorto- femoral bipass  2011  . Joint replacement Left 2010    knee  . Cardiac catheterization  2012  . Cardiac catheterization  06/02/2012    Unchanged anatomy compared to 3 years ago, continue medical therapy  . Cardiac catheterization  10/06/2009    Occluded right coronary artery, left-to-right collateral noncritical left disease with occluded grafts  . Angioplasty Left 02/23/2010    Darcon patch, 80-90% stenosis proximal to the anastomosis  . Coronary artery bypass graft  1993  . Coronary angioplasty    . Eye surgery    . Axillary-femoral bypass graft Right 10/08/2012    Procedure: BYPASS GRAFT AXILLA-BIFEMORAL;  Surgeon: Pryor Ochoa, MD;  Location: [facility] OR;  Service: Vascular;  Laterality: Right;   Family History  Problem Relation Age of Onset  . Heart disease Father   . Hypertension Father   . Heart attack Father   . Kidney failure Brother   . Hyperlipidemia Daughter    Social History:  reports that she quit smoking about 21 years ago. She has  never used smokeless tobacco. She reports that she does not drink alcohol or use illicit drugs. Allergies:  Allergies  Allergen Reactions  . Codeine Itching   Medications Prior to Admission  Medication Sig Dispense Refill  . amLODipine (NORVASC) 5 MG tablet Take 5 mg by mouth daily.       Marland Kitchen aspirin 81 MG chewable tablet Chew 81 mg by mouth daily.      Marland Kitchen atorvastatin (LIPITOR) 40 MG tablet Take 40 mg by mouth at bedtime.       . cholecalciferol (VITAMIN D) 1000 UNITS tablet Take 1,000 Units by mouth daily.      . clopidogrel (PLAVIX) 75 MG tablet Take 75 mg by mouth daily.      . diazepam (VALIUM) 5 MG tablet Take 5 mg by mouth 2 (two) times daily.      Marland Kitchen donepezil (ARICEPT) 5 MG tablet Take 5 mg by mouth daily.      Marland Kitchen glimepiride (AMARYL) 2 MG tablet Take 2 mg by mouth daily before breakfast.      . HYDROcodone-acetaminophen (NORCO/VICODIN) 5-325 MG per tablet Take 1 tablet by mouth every 6 (six) hours as needed for pain.       . isosorbide mononitrate (IMDUR) 30 MG 24 hr tablet Take 30 mg by mouth at bedtime.      . lansoprazole (PREVACID) 30 MG capsule Take 30 mg by mouth 2 (two) times daily.      Marland Kitchen levothyroxine (SYNTHROID, LEVOTHROID) 50 MCG tablet Take 50 mcg by mouth daily before breakfast.      . losartan (COZAAR) 100 MG tablet Take 100 mg by mouth daily.      . Melatonin 3 MG TABS Take 3 mg by mouth at bedtime.      . metoprolol tartrate (LOPRESSOR) 25 MG tablet Take 25 mg by mouth at bedtime.      . mupirocin ointment (BACTROBAN) 2 % Apply 1 application topically 2 (two) times daily. Apply to inside of both nostrils for 5 days starting 10/02/12      . nitroGLYCERIN (NITROSTAT) 0.4 MG SL tablet Place 0.4 mg under the tongue every 5 (five) minutes as needed for chest pain.       Marland Kitchen omega-3 acid ethyl esters (LOVAZA) 1 G capsule Take 2 g by mouth daily.      Marland Kitchen PARoxetine (PAXIL) 20 MG tablet Take 20 mg by mouth every morning.      Marland Kitchen acetaminophen (TYLENOL) 325 MG tablet Take 650 mg by mouth every 4 (four) hours as needed for pain.       . diphenhydrAMINE (BENADRYL) 12.5 MG/5ML elixir Take 12.5 mg by mouth as needed (for allergic reaction to Norco).       . ranolazine (RANEXA) 500 MG 12 hr tablet Take 1 tablet (500 mg total) by mouth 2 (two) times daily.  60 tablet  5     Home: Home Living Lives With: Alone Available Help at Discharge: Family;Available PRN/intermittently Type of Home: Assisted living Home Access: Level entry Home Layout: One level Home Adaptive Equipment: Wheelchair - manual;Walker - rolling  Functional History: Prior Function Able to Take Stairs?: No Driving: No Comments: Has recently been using w/c more frequently (past 2 weeks).  Functional Status:  Mobility: Bed Mobility Bed Mobility: Supine to Sit Supine to Sit: 3: Mod assist Transfers Transfers: Sit to Stand;Stand to Sit;Stand Pivot Transfers Sit to Stand: 4: Min assist;From bed;From chair/3-in-1;With upper extremity assist;With armrests Stand to Sit: 4: Min assist;To chair/3-in-1;With armrests;With  upper extremity assist Stand Pivot Transfers: 4: Min assist Ambulation/Gait Ambulation/Gait Assistance: Not tested (comment) (due to pain and reports not feeling well)    ADL: ADL Eating/Feeding: Performed;Independent Where Assessed - Eating/Feeding: Chair Grooming: Performed;Wash/dry hands;Set up Where Assessed - Grooming: Supported sitting Upper Body Bathing: Simulated;Set up Where Assessed - Upper Body Bathing: Supported sitting Lower Body Bathing: Simulated;Moderate assistance Where Assessed - Lower Body Bathing: Supported sit to stand Upper Body Dressing: Performed;Minimal assistance Where Assessed - Upper Body Dressing: Supported sitting Lower Body Dressing: Performed;Maximal assistance Where Assessed - Lower Body Dressing: Supported sit to stand Toilet Transfer: Performed;Minimal assistance Toilet Transfer Method: Surveyor, minerals: Bedside commode Equipment Used: Gait belt;Rolling walker Transfers/Ambulation Related to ADLs: Min assist with RW during SPT bed<>3n1 and with ambulating ~5 ft from 3n1<>recliner.  Assist for balance due to occasional posterior lean (pt rocks back on heels). ADL Comments: Pt limited by  pain.  Cognition: Cognition Overall Cognitive Status: History of cognitive impairments - at baseline (memory deficits) Arousal/Alertness: Awake/alert Orientation Level: Oriented X4 Cognition Arousal/Alertness: Awake/alert Behavior During Therapy: WFL for tasks assessed/performed Overall Cognitive Status: History of cognitive impairments - at baseline (memory deficits)  Blood pressure 139/68, pulse 111, temperature 98.1 F (36.7 C), temperature source Oral, resp. rate 19, height 5' (1.524 m), weight 63.05 kg (139 lb), SpO2 99.00%. Physical Exam  Vitals reviewed. HENT:  Head: Normocephalic.  Eyes: EOM are normal.  Neck: Normal range of motion. Neck supple. No thyromegaly present.  Cardiovascular:  Cardiac rate control  Pulmonary/Chest: Effort normal and breath sounds normal.  Abdominal: Soft. Bowel sounds are normal. She exhibits no distension.  Musculoskeletal:  Both legs warm and good color  Neurological: She is alert.  Patient names person place and date of birth. She follows simple commands. Strength grossly 4/5 UE with pain inhibition at shoulder girdle on right and proximally in Right lower ext---otherwise RLE is 3+ to 4/5.   Skin:  Surgical sites healing along chest/abdomen. Bruising noted. Areas appropriately tender.  Psychiatric: She has a normal mood and affect. Her behavior is normal. Judgment and thought content normal.    Results for orders placed during the hospital encounter of 10/08/12 (from the past 24 hour(s))  GLUCOSE, CAPILLARY     Status: Abnormal   Collection Time    10/12/12  6:01 AM      Result Value Range   Glucose-Capillary 64 (*) 70 - 99 mg/dL   Comment 1 Documented in Chart     Comment 2 Notify RN    GLUCOSE, CAPILLARY     Status: None   Collection Time    10/12/12  6:23 AM      Result Value Range   Glucose-Capillary 90  70 - 99 mg/dL   Comment 1 Documented in Chart     Comment 2 Notify RN    GLUCOSE, CAPILLARY     Status: None   Collection  Time    10/12/12 11:16 AM      Result Value Range   Glucose-Capillary 70  70 - 99 mg/dL   Comment 1 Notify RN     Comment 2 Documented in Chart    GLUCOSE, CAPILLARY     Status: Abnormal   Collection Time    10/12/12  4:01 PM      Result Value Range   Glucose-Capillary 101 (*) 70 - 99 mg/dL   Comment 1 Notify RN     Comment 2 Documented in Chart    GLUCOSE, CAPILLARY  Status: Abnormal   Collection Time    10/12/12  8:55 PM      Result Value Range   Glucose-Capillary 64 (*) 70 - 99 mg/dL   Comment 1 Documented in Chart     Comment 2 Notify RN    GLUCOSE, CAPILLARY     Status: None   Collection Time    10/12/12  9:23 PM      Result Value Range   Glucose-Capillary 90  70 - 99 mg/dL   Comment 1 Documented in Chart     Comment 2 Notify RN     No results found.  Assessment/Plan: Diagnosis: vascular claudication s/p FF BPG 1. Does the need for close, 24 hr/day medical supervision in concert with the patient's rehab needs make it unreasonable for this patient to be served in a less intensive setting? No 2. Co-Morbidities requiring supervision/potential complications: chf, dm, cad, DPN 3. Due to bladder management, bowel management, safety, skin/wound care, disease management, medication administration, pain management and patient education, does the patient require 24 hr/day rehab nursing? Yes 4. Does the patient require coordinated care of a physician, rehab nurse, PT (1-2 hrs/day, 5 days/week) and OT (1-2 hrs/day, 5 days/week) to address physical and functional deficits in the context of the above medical diagnosis(es)? No Addressing deficits in the following areas: balance, endurance, locomotion, strength, transferring, bowel/bladder control, bathing, dressing, feeding, grooming and toileting 5. Can the patient actively participate in an intensive therapy program of at least 3 hrs of therapy per day at least 5 days per week? No 6. The potential for patient to make measurable gains  while on inpatient rehab is fair 7. Anticipated functional outcomes upon discharge from inpatient rehab are n/a with PT, n/a with OT, n/a with SLP. 8. Estimated rehab length of stay to reach the above functional goals is: n/a 9. Does the patient have adequate social supports to accommodate these discharge functional goals? Potentially 10. Anticipated D/C setting: Home 11. Anticipated post D/C treatments: HH therapy 12. Overall Rehab/Functional Prognosis: excellent  RECOMMENDATIONS: This patient's condition is appropriate for continued rehabilitative care in the following setting: [facility] Patient has agreed to participate in recommended program. Yes Note that insurance prior authorization may be required for reimbursement for recommended care.  Comment:Lacks medical necessity for CIR. She prefers to go to ALF. They may be able provide addnl care initially as she progresses.            Ranelle Oyster, MD, [facility] Physical Medicine & Rehabilitation     10/13/2012

## 2012-10-13 NOTE — Progress Notes (Addendum)
[facility]  Subjective  - She is very worried about her progress.  She wants to go home to the assisted living facility.   Objective 147/68 93 97.5 F (36.4 C) (Oral) 20 100%  Intake/Output Summary (Last 24 hours) at 10/13/12 0751 Last data filed at 10/12/12 1400  Gross per 24 hour  Intake   6500 ml  Output      0 ml  Net   6500 ml   Incisions clean and dry DP pulses intact Tender to palpation at tunnel site without hematoma   Assessment/Planning: BYPASS GRAFT AXILLA-BIFEMORAL The patient's pre-op symptoms of pain in legs are Improved . Patients pain is well controlled. C/O soreness in right side and superpubic area. CIR verses SNF verses return to assisted living??? PT to access.    Clinton Gallant [facility] 10/13/2012 7:51 AM --  Laboratory Lab Results: No results found for this basename: WBC, HGB, HCT, PLT,  in the last 72 hours BMET No results found for this basename: NA, K, CL, CO2, GLUCOSE, BUN, CREATININE, CALCIUM,  in the last 72 hours  COAG Lab Results  Component Value Date   INR 1.03 10/02/2012   INR 1.09 05/31/2012   INR 2.30* 03/07/2010   No results found for this basename: PTT      The patient feeling stronger today Incisions are healing nicely 3+ pulse in axillobifemoral graft was well perfused lower extremities Able to get in and out of bed without help and ambulate independently  Think she is ready to be discharged to her assisted living facility and should do okay and DC'd today and return to see me in 2 weeks

## 2012-10-13 NOTE — Progress Notes (Signed)
Physical Therapy Treatment Patient Details Name: [name] Short MRN: [id number] DOB: 03/09/1928 Today's Date: 10/13/2012 Time: 1027-2536 PT Time Calculation (min): 13 min  PT Assessment / Plan / Recommendation Comments on Treatment Session  Pt requested return visit.  Went to see patient, and address patients questions regarding mobility expectations for discharge. Pt very appreciative. Educated patient regarding safety and use of assistive devices as assessed in earlier session. Spoke with patient regarding recommendation of HHPT and expectations.  Patient also had some additional concerns regarding pain medications, deferred this to nsg.  Nsg came into the room and provided information for patient.  Patient was pleased and appreciative. No further concerns at this point. Will continue to see patient as indicated for mobility.  Rec Dc home with HHPT.    Follow Up Recommendations  Home health PT;Supervision/Assistance - 24 hour       10/13/12 1200  PT Visit Information  Last PT Received On 10/13/12  PT Time Calculation  PT Start Time 1241  PT Stop Time 1254  PT Time Calculation (min) 13 min  Subjective Data  Subjective I just really had some questions  Patient Stated Goal to return home  Cognition  Arousal/Alertness Awake/alert  Behavior During Therapy WFL for tasks assessed/performed  Overall Cognitive Status History of cognitive impairments - at baseline (memory deficits)  PT - Assessment/Plan  Comments on Treatment Session Pt requested return visit.  Went to see patient, and address patients questions regarding mobility expectations for discharge. Pt very appreciative. Educated patient regarding safety and use of assistive devices as assessed in earlier session. Spoke with patient regarding recommendation of HHPT and expectations.  Patient also had some additional concerns regarding pain medications, deferred this to nsg.  Nsg came into the room and provided information for patient.   Patient was pleased and appreciative. No further concerns at this point. Will continue to see patient as indicated for mobility.  Rec Dc home with HHPT.  PT Plan Discharge plan needs to be updated  PT Frequency Min 3X/week  Follow Up Recommendations Home health PT;Supervision/Assistance - 24 hour  PT equipment None recommended by PT    Charlotte Crumb, PT DPT  [phone]

## 2012-10-13 NOTE — Clinical Social Work Note (Signed)
Clinical Social Worker facilitated patient discharge including contacting patient family and facility to confirm patient discharge plans.  Clinical information faxed to facility and family agreeable with plan.  CSW arranged transportation via family to [employer] .  RN to call report prior to discharge.  Clinical Social Worker will sign off for now as social work intervention is no longer needed. Please consult [place] again if new need arises.  Macario Golds, [place] [phone]

## 2012-10-13 NOTE — Progress Notes (Signed)
Physical Therapy Treatment Patient Details Name: [name] Short MRN: [id number] DOB: 03-21-1928 Today's Date: 10/13/2012 Time: 4098-1191 PT Time Calculation (min): 25 min  PT Assessment / Plan / Recommendation Comments on Treatment Session  Patient demonstrates significant progress towards PT goals at this time. Patient ambulating well with no complaints. Improved mobility overall. Patient tolerated ther ex well. Will continue to see and progress as tolerated.  Upon reassessment of current improvements, Feel patient has progressed well enough to be safe for dc home with HHPT.    Follow Up Recommendations  Home health PT;Supervision/Assistance - 24 hour           Equipment Recommendations  None recommended by PT    Recommendations for Other Services    Frequency Min 3X/week   Plan Discharge plan needs to be updated    Precautions / Restrictions Precautions Precautions: Fall Restrictions Weight Bearing Restrictions: No   Pertinent Vitals/Pain No pain    Mobility  Bed Mobility Bed Mobility: Supine to Sit Supine to Sit: 5: Supervision Details for Bed Mobility Assistance: no physical assist needed Transfers Transfers: Sit to Stand;Stand to Sit Sit to Stand: 5: Supervision Stand to Sit: 5: Supervision Details for Transfer Assistance: VCs for hand placement, performed x3 to reinforce, no physical assist needed Ambulation/Gait Ambulation/Gait Assistance: 5: Supervision;4: Min guard (due to pain and reports not feeling well) Ambulation Distance (Feet): 210 Feet Assistive device: Rolling walker Ambulation/Gait Assistance Details: patient steady no assist required Gait Pattern: Step-through pattern;Trunk flexed;Narrow base of support Gait velocity: decreased General Gait Details: overall good stability with ambulation    Exercises General Exercises - Lower Extremity Ankle Circles/Pumps: AROM;Both;10 reps Want Arc Quad: AROM;Both;10 reps Hip Flexion/Marching: AROM;Both;10 reps    PT Diagnosis:    PT Problem List:   PT Treatment Interventions:     PT Goals Acute Rehab PT Goals PT Goal Formulation: With patient Time For Goal Achievement: 10/23/12 Potential to Achieve Goals: Good Pt will go Supine/Side to Sit: with supervision PT Goal: Supine/Side to Sit - Progress: Met Pt will go Sit to Supine/Side: with supervision Pt will go Sit to Stand: with modified independence PT Goal: Sit to Stand - Progress: Progressing toward goal Pt will go Stand to Sit: with modified independence;with upper extremity assist PT Goal: Stand to Sit - Progress: Progressing toward goal Pt will Stand: with supervision;1 - 2 min;with unilateral upper extremity support PT Goal: Stand - Progress: Progressing toward goal Pt will Ambulate: 51 - 150 feet;with rolling walker;with supervision PT Goal: Ambulate - Progress: Progressing toward goal Pt will Perform Home Exercise Program: with supervision, verbal cues required/provided PT Goal: Perform Home Exercise Program - Progress: Progressing toward goal  Visit Information  Last PT Received On: 10/13/12 Assistance Needed: +1    Subjective Data  Subjective: I feel so much better Patient Stated Goal: To return home with daughter assist and HHPT at ALF   Cognition  Cognition Arousal/Alertness: Awake/alert Behavior During Therapy: WFL for tasks assessed/performed Overall Cognitive Status: History of cognitive impairments - at baseline (memory deficits)    Balance  Balance Balance Assessed: Yes Static Sitting Balance Static Sitting - Balance Support: Feet unsupported;Bilateral upper extremity supported Static Sitting - Level of Assistance: 5: Stand by assistance  End of Session PT - End of Session Equipment Utilized During Treatment: Gait belt Activity Tolerance: Patient limited by pain Patient left: in chair;with call bell/phone within reach Nurse Communication: Mobility status   GP     Fabio Asa 10/13/2012, 11:26 AM

## 2012-10-13 NOTE — Progress Notes (Signed)
Inpatient Diabetes Program Recommendations  AACE/ADA: New Consensus Statement on Inpatient Glycemic Control (2013)  Target Ranges:  Prepandial:   less than 140 mg/dL      Peak postprandial:   less than 180 mg/dL (1-2 hours)      Critically ill patients:  140 - 180 mg/dL   Reason for Assessment: Hypoglycemia  Results for ERNESTA, TRABERT (MRN [id number]) as of 10/13/2012 14:58  Ref. Range 10/12/2012 06:23 10/12/2012 11:16 10/12/2012 16:01 10/12/2012 20:55 10/12/2012 21:23 10/13/2012 06:09 10/13/2012 11:18  Glucose-Capillary Latest Range: 70-99 mg/dL 90 70 409 (H) 64 (L) 90 77 101 (H)    Note:  Patient's Hbg A1C is 5.4 which is indicative of tight glycemic control.  According to the American Diabetes Association (ADA) an appropriate goal for an A1C in an active, healthy adult would be around 7.0 or under.  The ADA further recommends: Less stringent A1C goals (such as ,8%) may be appropriate for patients with a history of severe hypoglycemia, limited life expectancy, advanced microvascular or macrovascular complications, extensive comorbid conditions, and those with Friedhoff-standing diabetes in whom the general goal is difficult to attain despite DSME, appropriate glucose monitoring, and effective doses of multiple glucose-lowering agents including insulin. (B)  May benefit from holding her Amaryl when she is discharged back to her facility since CBG's have been rather low this admission with several instances of hypoglycemia.  The receiving facility Physician could then resume responsibility for diabetes management and order medications as indicated.  Thank you.  Tiah Heckel S. Elsie Lincoln, RN, CNS, CDE Inpatient Diabetes Program, team pager [phone]

## 2012-10-15 ENCOUNTER — Telehealth: Payer: Self-pay | Admitting: Vascular Surgery

## 2012-10-21 ENCOUNTER — Ambulatory Visit: Payer: Medicare Other | Admitting: Vascular Surgery

## 2012-10-21 ENCOUNTER — Encounter: Payer: Self-pay | Admitting: Cardiovascular Disease

## 2012-10-23 ENCOUNTER — Ambulatory Visit: Payer: Medicare Other | Admitting: Vascular Surgery

## 2012-10-27 ENCOUNTER — Telehealth: Payer: Self-pay | Admitting: Vascular Surgery

## 2012-10-27 NOTE — Telephone Encounter (Signed)
Reviewed

## 2012-11-03 ENCOUNTER — Encounter: Payer: Self-pay | Admitting: Vascular Surgery

## 2012-11-04 ENCOUNTER — Encounter: Payer: Self-pay | Admitting: Vascular Surgery

## 2012-11-04 ENCOUNTER — Ambulatory Visit (INDEPENDENT_AMBULATORY_CARE_PROVIDER_SITE_OTHER): Payer: Medicare Other | Admitting: Vascular Surgery

## 2012-11-04 VITALS — BP 131/64 | HR 80 | Ht 60.0 in | Wt 134.0 lb

## 2012-11-04 DIAGNOSIS — Z48812 Encounter for surgical aftercare following surgery on the circulatory system: Secondary | ICD-10-CM

## 2012-11-04 DIAGNOSIS — I70219 Atherosclerosis of native arteries of extremities with intermittent claudication, unspecified extremity: Secondary | ICD-10-CM | POA: Insufficient documentation

## 2012-11-04 NOTE — Progress Notes (Signed)
Subjective:     Patient ID: [name] Short, female   DOB: March 11, 1928, 79 y.o.   MRN: [id number]  HPI this 79 year old female 2 and half weeks post right axillofemoral and right-to-left femoral-femoral bypass for severe claudication in both legs due to aortoiliac occlusive disease. She states that her symptoms have been completely relieved. She is able to ambulate without difficulty.   Review of Systems     Objective:   Physical Exam BP 131/64  Pulse 80  Ht 5' (1.524 m)  Wt 134 lb (60.782 kg)  BMI 26.17 kg/m2  SpO2 98%  General elderly female in no apparent distress alert and oriented x3 Right intraocular clavicular incision and bilateral inguinal incisions healed nicely with excellent pulses in all limbs of right axillofemoral and right-to-left femoral-femoral bypass. Both feet well perfused. No evidence of infection.     Assessment:     Status post right axillobifemoral bypass graft with complete relief of claudication and rest pain    Plan:     Return in 2 months with ABIs for continued followup

## 2012-11-04 NOTE — Addendum Note (Signed)
Addended by: Adria Dill L on: 11/04/2012 04:27 PM   Modules accepted: Orders

## 2012-12-17 ENCOUNTER — Other Ambulatory Visit: Payer: Self-pay

## 2012-12-22 NOTE — Telephone Encounter (Signed)
Reviewed

## 2012-12-31 ENCOUNTER — Emergency Department (HOSPITAL_COMMUNITY): Payer: Medicare Other

## 2012-12-31 ENCOUNTER — Emergency Department (HOSPITAL_COMMUNITY)
Admission: EM | Admit: 2012-12-31 | Discharge: 2012-12-31 | Disposition: A | Discharge status: Home / Self Care | Payer: Medicare Other | Attending: Emergency Medicine | Admitting: Emergency Medicine

## 2012-12-31 ENCOUNTER — Encounter (HOSPITAL_COMMUNITY): Payer: Self-pay | Admitting: Emergency Medicine

## 2012-12-31 DIAGNOSIS — R062 Wheezing: Secondary | ICD-10-CM | POA: Insufficient documentation

## 2012-12-31 DIAGNOSIS — M129 Arthropathy, unspecified: Secondary | ICD-10-CM | POA: Insufficient documentation

## 2012-12-31 DIAGNOSIS — K219 Gastro-esophageal reflux disease without esophagitis: Secondary | ICD-10-CM | POA: Insufficient documentation

## 2012-12-31 DIAGNOSIS — J209 Acute bronchitis, unspecified: Secondary | ICD-10-CM | POA: Insufficient documentation

## 2012-12-31 DIAGNOSIS — Z9861 Coronary angioplasty status: Secondary | ICD-10-CM | POA: Insufficient documentation

## 2012-12-31 DIAGNOSIS — I252 Old myocardial infarction: Secondary | ICD-10-CM | POA: Insufficient documentation

## 2012-12-31 DIAGNOSIS — R059 Cough, unspecified: Secondary | ICD-10-CM | POA: Insufficient documentation

## 2012-12-31 DIAGNOSIS — J40 Bronchitis, not specified as acute or chronic: Secondary | ICD-10-CM

## 2012-12-31 DIAGNOSIS — IMO0002 Reserved for concepts with insufficient information to code with codable children: Secondary | ICD-10-CM | POA: Insufficient documentation

## 2012-12-31 DIAGNOSIS — Z8659 Personal history of other mental and behavioral disorders: Secondary | ICD-10-CM | POA: Insufficient documentation

## 2012-12-31 DIAGNOSIS — I509 Heart failure, unspecified: Secondary | ICD-10-CM | POA: Insufficient documentation

## 2012-12-31 DIAGNOSIS — Z951 Presence of aortocoronary bypass graft: Secondary | ICD-10-CM | POA: Insufficient documentation

## 2012-12-31 DIAGNOSIS — F329 Major depressive disorder, single episode, unspecified: Secondary | ICD-10-CM | POA: Insufficient documentation

## 2012-12-31 DIAGNOSIS — Z9889 Other specified postprocedural states: Secondary | ICD-10-CM | POA: Insufficient documentation

## 2012-12-31 DIAGNOSIS — I10 Essential (primary) hypertension: Secondary | ICD-10-CM | POA: Insufficient documentation

## 2012-12-31 DIAGNOSIS — F3289 Other specified depressive episodes: Secondary | ICD-10-CM | POA: Insufficient documentation

## 2012-12-31 DIAGNOSIS — E119 Type 2 diabetes mellitus without complications: Secondary | ICD-10-CM | POA: Insufficient documentation

## 2012-12-31 DIAGNOSIS — Z7982 Long term (current) use of aspirin: Secondary | ICD-10-CM | POA: Insufficient documentation

## 2012-12-31 DIAGNOSIS — R0989 Other specified symptoms and signs involving the circulatory and respiratory systems: Secondary | ICD-10-CM | POA: Insufficient documentation

## 2012-12-31 DIAGNOSIS — R0609 Other forms of dyspnea: Secondary | ICD-10-CM | POA: Insufficient documentation

## 2012-12-31 DIAGNOSIS — Z8679 Personal history of other diseases of the circulatory system: Secondary | ICD-10-CM | POA: Insufficient documentation

## 2012-12-31 DIAGNOSIS — Z85828 Personal history of other malignant neoplasm of skin: Secondary | ICD-10-CM | POA: Insufficient documentation

## 2012-12-31 DIAGNOSIS — F0390 Unspecified dementia without behavioral disturbance: Secondary | ICD-10-CM | POA: Insufficient documentation

## 2012-12-31 DIAGNOSIS — Z87891 Personal history of nicotine dependence: Secondary | ICD-10-CM | POA: Insufficient documentation

## 2012-12-31 DIAGNOSIS — E079 Disorder of thyroid, unspecified: Secondary | ICD-10-CM | POA: Insufficient documentation

## 2012-12-31 DIAGNOSIS — I2510 Atherosclerotic heart disease of native coronary artery without angina pectoris: Secondary | ICD-10-CM | POA: Insufficient documentation

## 2012-12-31 DIAGNOSIS — Z96659 Presence of unspecified artificial knee joint: Secondary | ICD-10-CM | POA: Insufficient documentation

## 2012-12-31 DIAGNOSIS — Z79899 Other long term (current) drug therapy: Secondary | ICD-10-CM | POA: Insufficient documentation

## 2012-12-31 DIAGNOSIS — I4891 Unspecified atrial fibrillation: Secondary | ICD-10-CM | POA: Insufficient documentation

## 2012-12-31 DIAGNOSIS — R05 Cough: Secondary | ICD-10-CM | POA: Insufficient documentation

## 2012-12-31 DIAGNOSIS — R51 Headache: Secondary | ICD-10-CM | POA: Insufficient documentation

## 2012-12-31 LAB — COMPREHENSIVE METABOLIC PANEL
ALT: 11 U/L (ref 0–35)
Alkaline Phosphatase: 90 U/L (ref 39–117)
GFR calc Af Amer: >90 mL/min (ref >90)
Glucose, Bld: 86 mg/dL (ref 70–99)
Potassium: 3.2 mEq/L — ABNORMAL LOW (ref 3.5–5.1)
Sodium: 139 mEq/L (ref 135–145)
Total Protein: 5.8 g/dL — ABNORMAL LOW (ref 6.0–8.3)

## 2012-12-31 LAB — CBC WITH DIFFERENTIAL/PLATELET
Basophils Absolute: 0 10*3/uL (ref 0.0–0.1)
HCT: 34.8 % — ABNORMAL LOW (ref 36.0–46.0)
Hemoglobin: 11.7 g/dL — ABNORMAL LOW (ref 12.0–15.0)
Lymphocytes Relative: 23 % (ref 12–46)
Lymphs Abs: 1.7 10*3/uL (ref 0.7–4.0)
Monocytes Absolute: 0.6 10*3/uL (ref 0.1–1.0)
Monocytes Relative: 8 % (ref 3–12)
Neutro Abs: 4.9 10*3/uL (ref 1.7–7.7)
RBC: 3.98 MIL/uL (ref 3.87–5.11)
RDW: 13.4 % (ref 11.5–15.5)
WBC: 7.4 10*3/uL (ref 4.0–10.5)

## 2012-12-31 MED ORDER — IPRATROPIUM BROMIDE 0.02 % IN SOLN
RESPIRATORY_TRACT | Status: AC
  Administered 2012-12-31: 0.5 mg
  Filled 2012-12-31: qty 2.5

## 2012-12-31 MED ORDER — ALBUTEROL SULFATE (5 MG/ML) 0.5% IN NEBU
5.0000 mg | INHALATION_SOLUTION | Freq: Once | RESPIRATORY_TRACT | Status: AC
  Filled 2012-12-31: qty 1

## 2012-12-31 MED ORDER — PREDNISONE 20 MG PO TABS: 40.0000 mg | ORAL_TABLET | Freq: Every day | ORAL | Status: DC

## 2012-12-31 MED ORDER — ALBUTEROL SULFATE (5 MG/ML) 0.5% IN NEBU
INHALATION_SOLUTION | RESPIRATORY_TRACT | Status: AC
  Administered 2012-12-31: 2.5 mg via RESPIRATORY_TRACT
  Filled 2012-12-31: qty 1

## 2012-12-31 MED ORDER — ALBUTEROL SULFATE HFA 108 (90 BASE) MCG/ACT IN AERS: 1.0000 | INHALATION_SPRAY | Freq: Four times a day (QID) | RESPIRATORY_TRACT | Status: AC | PRN

## 2012-12-31 MED ORDER — LEVOFLOXACIN 500 MG PO TABS: 500.0000 mg | ORAL_TABLET | Freq: Every day | ORAL | Status: DC

## 2012-12-31 MED ORDER — ALBUTEROL SULFATE HFA 108 (90 BASE) MCG/ACT IN AERS
2.0000 | INHALATION_SPRAY | RESPIRATORY_TRACT | Status: DC | PRN
  Filled 2012-12-31: qty 6.7

## 2012-12-31 NOTE — ED Provider Notes (Signed)
CSN: [id number]     Arrival date & time 12/31/12  4098 History     First MD Initiated Contact with Patient 12/31/12 [phone]     Chief Complaint  Patient presents with  . Shortness of Breath   (Consider location/radiation/quality/duration/timing/severity/associated sxs/prior Treatment) HPI  [name] Short is a 79 y.o.female with a significant PMH of diabetes, hypertension, CAD, thyroid disease, depression, CHF, atrial fibrillation, MI, anginal pain, dementia, GERD,  presents to the ER with complaints of shortness of breath and coughing. She has been coughing for a couple of days now but woke up acutely this morning very short of breath and wheezy. Pt has not had a fever but says she normally does not spike fevers. She has not been feeling well but has had no nausea, vomiting, diarrhea, chest pains or lower extremity swelling. She is able to speak in full sentences but appears sick.    Past Medical History  Diagnosis Date  . Diabetes mellitus without complication   . Hypertension   . Coronary artery disease   . Thyroid disease   . Depression   . Memory loss   . Skin cancer   . PVD (peripheral vascular disease)   . CHF (congestive heart failure)   . Atrial fibrillation   . Myocardial infarction   . Dyspnea 06/01/2012    2D Echo - EF 60-65, mitral valve calcified annulus, left atrium severely dilated, right atrium moderately dilated  . Chest pain 10/07/2009    2D Echo - EF 50-55, mitral valve calcified annulus  . CAD (coronary artery disease) 03/01/2008    Lexiscan - EF 79, no ECG changes/EKG negative for ischemia, post-stress EF 86  . Cerebral atherosclerosis 10/15/2011    Extracranial Examination - mild-moderate amount smooth mixed density plaque elevating velocities within the proximal and mid segments of the internal carotid artery  . Claudication 09/01/2010    LEA Doppler - Bilateral ABIs-demonstrated moderate arterial insufficiency to the lower extremities at rest, Right CIA/EIA-known  occlusive disease, PV Cath  . Anginal pain     not in in last month  . Dementia   . GERD (gastroesophageal reflux disease)     takes prevacid  . Arthritis    Past Surgical History  Procedure Laterality Date  . Abdominal hysterectomy    . Aorto- femoral bipass  2011  . Joint replacement Left 2010    knee  . Cardiac catheterization  2012  . Cardiac catheterization  06/02/2012    Unchanged anatomy compared to 3 years ago, continue medical therapy  . Cardiac catheterization  10/06/2009    Occluded right coronary artery, left-to-right collateral noncritical left disease with occluded grafts  . Angioplasty Left 02/23/2010    Darcon patch, 80-90% stenosis proximal to the anastomosis  . Coronary artery bypass graft  1993  . Coronary angioplasty    . Eye surgery    . Axillary-femoral bypass graft Right 10/08/2012    Procedure: BYPASS GRAFT AXILLA-BIFEMORAL;  Surgeon: Pryor Ochoa, MD;  Location: [facility] OR;  Service: Vascular;  Laterality: Right;   Family History  Problem Relation Age of Onset  . Heart disease Father   . Hypertension Father   . Heart attack Father   . Kidney failure Brother   . Hyperlipidemia Daughter    History  Substance Use Topics  . Smoking status: Former Smoker    Quit date: 05/15/1991  . Smokeless tobacco: Never Used  . Alcohol Use: No   OB History   Grav Para Term Preterm Abortions  TAB SAB Ect Mult Living                 Review of Systems  Review of Systems  Gen: no weight loss, fevers, chills, night sweats  Eyes: no discharge or drainage, no occular pain or visual changes  Nose: no epistaxis or rhinorrhea  Mouth: no dental pain, no sore throat  Neck: no neck pain  Lungs + wheezing, coughing  NO hemoptysis CV: no chest pain, palpitations, dependent edema or orthopnea  Abd: no abdominal pain, nausea, vomiting  GU: no dysuria or gross hematuria  MSK:  No abnormalities  Neuro: no focal neurologic deficits + headache Skin: no abnormalities Psyche:  negative.   Allergies  Codeine  Home Medications   Current Outpatient Rx  Name  Route  Sig  Dispense  Refill  . acetaminophen (TYLENOL) 325 MG tablet   Oral   Take 650 mg by mouth every 4 (four) hours as needed for pain.          Marland Kitchen amLODipine (NORVASC) 5 MG tablet   Oral   Take 5 mg by mouth daily.         Marland Kitchen aspirin 81 MG chewable tablet   Oral   Chew 81 mg by mouth daily.         Marland Kitchen atorvastatin (LIPITOR) 40 MG tablet   Oral   Take 40 mg by mouth at bedtime.          . cholecalciferol (VITAMIN D) 1000 UNITS tablet   Oral   Take 1,000 Units by mouth daily.         . clopidogrel (PLAVIX) 75 MG tablet   Oral   Take 75 mg by mouth daily.         . diazepam (VALIUM) 5 MG tablet   Oral   Take 5 mg by mouth 2 (two) times daily.         . diphenhydrAMINE (BENADRYL) 12.5 MG/5ML elixir   Oral   Take 12.5 mg by mouth as needed (for allergic reaction to Norco).          . donepezil (ARICEPT) 5 MG tablet   Oral   Take 5 mg by mouth daily.         . ferrous sulfate 325 (65 FE) MG tablet   Oral   Take 325 mg by mouth daily with breakfast.         . HYDROcodone-acetaminophen (NORCO/VICODIN) 5-325 MG per tablet   Oral   Take 1 tablet by mouth every 6 (six) hours as needed for pain.   40 tablet   0   . isosorbide mononitrate (IMDUR) 30 MG 24 hr tablet   Oral   Take 30 mg by mouth at bedtime.         . lansoprazole (PREVACID) 30 MG capsule   Oral   Take 30 mg by mouth 2 (two) times daily.         Marland Kitchen levothyroxine (SYNTHROID, LEVOTHROID) 50 MCG tablet   Oral   Take 50 mcg by mouth daily before breakfast.         . losartan (COZAAR) 100 MG tablet   Oral   Take 100 mg by mouth daily.         . Melatonin 3 MG TABS   Oral   Take 3 mg by mouth at bedtime.         . metoprolol tartrate (LOPRESSOR) 25 MG tablet   Oral   Take 25  mg by mouth at bedtime.         . Naproxen Sodium 220 MG CAPS   Oral   Take 220 mg by mouth daily with  breakfast.         . omega-3 acid ethyl esters (LOVAZA) 1 G capsule   Oral   Take 2 g by mouth daily.         Marland Kitchen PARoxetine (PAXIL) 20 MG tablet   Oral   Take 20 mg by mouth every morning.         . ranolazine (RANEXA) 500 MG 12 hr tablet   Oral   Take 1 tablet (500 mg total) by mouth 2 (two) times daily.   60 tablet   5   . levofloxacin (LEVAQUIN) 500 MG tablet   Oral   Take 1 tablet (500 mg total) by mouth daily.   7 tablet   0   . nitroGLYCERIN (NITROSTAT) 0.4 MG SL tablet   Sublingual   Place 0.4 mg under the tongue every 5 (five) minutes as needed for chest pain.          . predniSONE (DELTASONE) 20 MG tablet   Oral   Take 2 tablets (40 mg total) by mouth daily.   8 tablet   0    BP 122/93  Pulse 84  Temp(Src) 97.7 F (36.5 C) (Oral)  Resp 18  SpO2 89% Physical Exam  Nursing note and vitals reviewed. Constitutional: She appears well-developed and well-nourished. No distress.  HENT:  Head: Normocephalic and atraumatic.   HEENT: Anicteric.  No pallor.  No discharge from ears, eyes, nose, or mouth.   Eyes: Pupils are equal, round, and reactive to light.  Neck: Normal range of motion. Neck supple.  Cardiovascular: Normal rate and regular rhythm.   Pulmonary/Chest: Effort normal. Not bradypneic. She has wheezes (expiratory wheezing). She has rhonchi (diffuse).  Increased effort of breathing.   Abdominal: Soft.  Neurological: She is alert.  Skin: Skin is warm and dry.    ED Course   Procedures (including critical care time)  Labs Reviewed  CBC WITH DIFFERENTIAL - Abnormal; Notable for the following:    Hemoglobin 11.7 (*)    HCT 34.8 (*)    All other components within normal limits  PRO B NATRIURETIC PEPTIDE - Abnormal; Notable for the following:    Pro B Natriuretic peptide (BNP) 1160.0 (*)    All other components within normal limits  COMPREHENSIVE METABOLIC PANEL - Abnormal; Notable for the following:    Potassium 3.2 (*)    Total  Protein 5.8 (*)    Albumin 3.3 (*)    GFR calc non Af Amer 79 (*)    All other components within normal limits  TROPONIN I  PROTIME-INR   Dg Chest 2 View  12/31/2012   *RADIOLOGY REPORT*  Clinical Data: Shortness of breath.  CHEST - 2 VIEW  Comparison: 08/11/2012  Findings: Cardiomegaly.  No overt edema.  No focal opacities or effusions.  Prior CABG.  No acute bony abnormality.  IMPRESSION: Stable cardiomegaly.  No acute findings.   Original Report Authenticated By: Charlett Nose, M.D.   1. Bronchitis     MDM   Date: 12/31/2012  Rate: 81  Rhythm: atrial Fibrillation  QRS Axis: normal  Intervals: normal  ST/T Wave abnormalities: borderline T abnormalities  Conduction Disutrbances:none  Narrative Interpretation:   Old EKG Reviewed: unchanged  8:32 am- Patient feeling much better. Her chest xray shows no acute cardiopulmonary disease. Her  wheezing has resolved and her rhonchi has significantly resolved. Her labs are reassuring. She lives at an assisted living facility and prefers to go home. Will ambulate with pulse ox and make sure she tolerates well.  Patient tolerated ambulation well and maintained her O2 sats. Will discharge and Levaquin and prednisone dose pack. Discussed plan with Dr. Blinda Leatherwood, who has seen patient, before discharge.    9:00am- pt walked to the bathroom without any difficulty. Her lowest sat reading was 89 % on room air during ambulation. Pt really perfers to go home and looks well. Will give PO prednisone dose pack, albuterol inhaler for home and Levaquin abx. Pt to follow-up this week or early next week for PCP check up.  79 y.o.Moorea Weissmann's evaluation in the Emergency Department is complete. It has been determined that no acute conditions requiring further emergency intervention are present at this time. The patient/guardian have been advised of the diagnosis and plan. We have discussed signs and symptoms that warrant return to the ED, such as changes or worsening  in symptoms.  Vital signs are stable at discharge. Filed Vitals:   12/31/12 0812  BP: 122/93  Pulse: 84  Temp: 97.7 F (36.5 C)  Resp: 18    Patient/guardian has voiced understanding and agreed to follow-up with the PCP or specialist.   Dorthula Matas, PA-C 12/31/12 [phone]

## 2012-12-31 NOTE — ED Notes (Signed)
Pt woke up with shortness of breath this morning. States she was coughing up mucous and has chronic A-fib. Pt 90-91% RA nasal cannula applied 3L 100%.

## 2013-01-01 NOTE — ED Provider Notes (Signed)
Medical screening examination/treatment/procedure(s) were conducted as a shared visit with non-physician practitioner(s) and myself.  I personally evaluated the patient during the encounter.   Patient presented with moderate dyspnea. She has been sick for several days with cough and chest congestion. Examination did reveal diffuse rhonchi with slight wheezing which improved with nebulizer treatment. Chest x-ray did not show any pneumonia. Patient improved with treatment and was felt stable for discharge to followup as an outpatient. Empiric Tx with antibiotics and albuterol.  Gilda Crease, MD 01/01/13 [phone]

## 2013-01-05 ENCOUNTER — Encounter: Payer: Self-pay | Admitting: Vascular Surgery

## 2013-01-06 ENCOUNTER — Ambulatory Visit (INDEPENDENT_AMBULATORY_CARE_PROVIDER_SITE_OTHER): Payer: Medicare Other | Admitting: Vascular Surgery

## 2013-01-06 ENCOUNTER — Encounter (INDEPENDENT_AMBULATORY_CARE_PROVIDER_SITE_OTHER): Payer: Medicare Other | Admitting: *Deleted

## 2013-01-06 ENCOUNTER — Encounter: Payer: Self-pay | Admitting: Vascular Surgery

## 2013-01-06 VITALS — BP 148/83 | HR 71 | Resp 16 | Ht 60.0 in | Wt 137.0 lb

## 2013-01-06 DIAGNOSIS — I739 Peripheral vascular disease, unspecified: Secondary | ICD-10-CM

## 2013-01-06 DIAGNOSIS — I70219 Atherosclerosis of native arteries of extremities with intermittent claudication, unspecified extremity: Secondary | ICD-10-CM

## 2013-01-06 DIAGNOSIS — Z48812 Encounter for surgical aftercare following surgery on the circulatory system: Secondary | ICD-10-CM

## 2013-01-06 NOTE — Progress Notes (Signed)
Subjective:     Patient ID: [name] Short, female   DOB: August 11, 1927, 79 y.o.   MRN: [id number]  HPI this 79 year old female is 3 months status post right axillobifemoral bypass for severe ischemia of both lower extremities. She previously undergone aortobifemoral bypass graft in 1994 which ultimately failed. She done very well from this standpoint with no rest pain and no claudication symptoms. She ambulates with a walker at the nursing facility.  Review of Systems     Objective:   Physical Exam BP 148/83  Pulse 71  Resp 16  Ht 5' (1.524 m)  Wt 137 lb (62.143 kg)  BMI 26.76 kg/m2  General well-developed well-nourished elderly female in no apparent stress alert and oriented x3 The 3 surgical wounds in the right infraclavicular area and both inguinal areas are all healed nicely with excellent pulses and Bifemoral graft and 2+ posterior tibial pulses palpable bilaterally.  Today I ordered bilateral ABIs Jarvie and interpreted. ABI on the right is 0.94 on the left is 1.07      Assessment:     Doing well 3 months post axillobifemoral bypass graft for severe ischemia and claudication both lower extremities     Plan:     Return in 3 months for repeat ABIs in to see nurse practitioner

## 2013-01-07 NOTE — Addendum Note (Signed)
Addended by: Sharee Pimple on: 01/07/2013 11:41 AM   Modules accepted: Orders

## 2013-01-14 ENCOUNTER — Ambulatory Visit: Payer: Self-pay | Admitting: Cardiovascular Disease

## 2013-02-05 ENCOUNTER — Ambulatory Visit: Payer: Self-pay | Admitting: Cardiovascular Disease

## 2013-02-06 ENCOUNTER — Other Ambulatory Visit (HOSPITAL_COMMUNITY): Payer: Self-pay | Admitting: Cardiovascular Disease

## 2013-02-11 ENCOUNTER — Inpatient Hospital Stay (HOSPITAL_COMMUNITY): Admission: RE | Admit: 2013-02-11 | Payer: Self-pay | Source: Ambulatory Visit

## 2013-02-26 ENCOUNTER — Ambulatory Visit: Payer: Self-pay | Admitting: Cardiovascular Disease

## 2013-03-03 ENCOUNTER — Ambulatory Visit (HOSPITAL_COMMUNITY)
Admission: RE | Admit: 2013-03-03 | Discharge: 2013-03-03 | Disposition: A | Discharge status: Home / Self Care | Payer: Medicare Other | Source: Ambulatory Visit | Attending: Cardiovascular Disease | Admitting: Cardiovascular Disease

## 2013-03-03 DIAGNOSIS — I6529 Occlusion and stenosis of unspecified carotid artery: Secondary | ICD-10-CM | POA: Insufficient documentation

## 2013-03-03 NOTE — Progress Notes (Signed)
Carotid Duplex Completed. °Brianna L Mazza,RVT °

## 2013-03-07 ENCOUNTER — Telehealth: Payer: Self-pay | Admitting: *Deleted

## 2013-03-07 ENCOUNTER — Encounter: Payer: Self-pay | Admitting: *Deleted

## 2013-03-07 DIAGNOSIS — I6529 Occlusion and stenosis of unspecified carotid artery: Secondary | ICD-10-CM

## 2013-03-07 NOTE — Telephone Encounter (Signed)
Message copied by Marella Bile on Sat Mar 07, 2013  1:39 PM ------      Message from: Runell Gess      Created: Wed Mar 04, 2013  8:39 PM       No change from prior study. Repeat in 12 months. ------

## 2013-03-07 NOTE — Telephone Encounter (Signed)
Order placed for repeat carotid doppler in 1 year 

## 2013-03-10 ENCOUNTER — Encounter: Payer: Self-pay | Admitting: *Deleted

## 2013-03-12 ENCOUNTER — Ambulatory Visit (INDEPENDENT_AMBULATORY_CARE_PROVIDER_SITE_OTHER): Payer: Medicare Other | Admitting: Cardiovascular Disease

## 2013-03-12 ENCOUNTER — Encounter: Payer: Self-pay | Admitting: Cardiovascular Disease

## 2013-03-12 VITALS — BP 118/62 | HR 64 | Ht 60.0 in | Wt 141.0 lb

## 2013-03-12 DIAGNOSIS — I70219 Atherosclerosis of native arteries of extremities with intermittent claudication, unspecified extremity: Secondary | ICD-10-CM

## 2013-03-12 DIAGNOSIS — E785 Hyperlipidemia, unspecified: Secondary | ICD-10-CM

## 2013-03-12 DIAGNOSIS — I4891 Unspecified atrial fibrillation: Secondary | ICD-10-CM

## 2013-03-12 DIAGNOSIS — I10 Essential (primary) hypertension: Secondary | ICD-10-CM | POA: Insufficient documentation

## 2013-03-12 DIAGNOSIS — I482 Chronic atrial fibrillation, unspecified: Secondary | ICD-10-CM

## 2013-03-12 DIAGNOSIS — I2510 Atherosclerotic heart disease of native coronary artery without angina pectoris: Secondary | ICD-10-CM

## 2013-03-12 NOTE — Assessment & Plan Note (Signed)
Well-controlled on current medications 

## 2013-03-12 NOTE — Assessment & Plan Note (Signed)
On statin therapy. Followed by her PCP

## 2013-03-12 NOTE — Progress Notes (Signed)
03/12/2013 [name] Short   [birth year]  [id number]  Primary Physician MAZZOCCHI, Rise Mu, MD Primary Cardiologist: Runell Gess MD [name] Short   HPI:  The patient is an 79 year old female with a history of coronary artery bypass grafting in 1993. She had an SVG to the RCA and an SVG to the circumflex. These subsequently occluded. She has good left to right collaterals. She was recently admitted, May 30, 2012, with chest pain and she was catheterized again. There was no change in anatomy. The LAD was patent and the circumflex was patent. There were left to right collaterals and total RCA, and her LV function was good. Patient presents today complaining that she stays tired and dizzy a lot and is not sleeping very well. She states her dizziness typically occurs only when she gets up and the shortness of breath is on occasion. Other than that, she denies nausea, vomiting, fever, chest pain, lower extremity edema, diarrhea, constipation, orthopnea, PND. Patient does live at [facility]. I think she initially thought it was more like a prison, having the doors locked and having to wear an ankle cuff at one point. I am not sure why that was the case. She seems fairly alert and oriented, but apparently she likes it a little bit more now and likes a lot of the people who are there, so she seems fairly happy.since I saw her back in April she has undergone right Ax-Fem/Fem-Fem bypass grafting by Dr. Betti Cruz. Er claudication has resolved. She denies chest pain or shortness of breath.      Current Outpatient Prescriptions  Medication Sig Dispense Refill  . acetaminophen (TYLENOL) 325 MG tablet Take 650 mg by mouth every 4 (four) hours as needed for pain.       Marland Kitchen albuterol (PROVENTIL HFA;VENTOLIN HFA) 108 (90 BASE) MCG/ACT inhaler Inhale 1-2 puffs into the lungs every 6 (six) hours as needed for wheezing.  1 Inhaler  0  . amLODipine (NORVASC) 5 MG tablet Take 5 mg by mouth daily.       Marland Kitchen aspirin 81 MG chewable tablet Chew 81 mg by mouth daily.      Marland Kitchen atorvastatin (LIPITOR) 40 MG tablet Take 40 mg by mouth at bedtime.       . cholecalciferol (VITAMIN D) 1000 UNITS tablet Take 1,000 Units by mouth daily.      . clopidogrel (PLAVIX) 75 MG tablet Take 75 mg by mouth daily.      . diazepam (VALIUM) 5 MG tablet Take 5 mg by mouth 2 (two) times daily.      . diphenhydrAMINE (BENADRYL) 12.5 MG/5ML elixir Take 12.5 mg by mouth as needed (for allergic reaction to Norco).       . donepezil (ARICEPT) 5 MG tablet Take 5 mg by mouth daily.      . ferrous sulfate 325 (65 FE) MG tablet Take 325 mg by mouth daily with breakfast.      . HYDROcodone-acetaminophen (NORCO/VICODIN) 5-325 MG per tablet Take 1 tablet by mouth every 6 (six) hours as needed for pain.  40 tablet  0  . isosorbide mononitrate (IMDUR) 30 MG 24 hr tablet Take 30 mg by mouth at bedtime.      . lansoprazole (PREVACID) 30 MG capsule Take 30 mg by mouth 2 (two) times daily.      Marland Kitchen levothyroxine (SYNTHROID, LEVOTHROID) 50 MCG tablet Take 50 mcg by mouth daily before breakfast.      . losartan (COZAAR) 100 MG  tablet Take 100 mg by mouth daily.      . Melatonin 3 MG TABS Take 3 mg by mouth at bedtime.      . metoprolol tartrate (LOPRESSOR) 25 MG tablet Take 25 mg by mouth at bedtime.      . Naproxen Sodium 220 MG CAPS Take 220 mg by mouth daily with breakfast.      . nitroGLYCERIN (NITROSTAT) 0.4 MG SL tablet Place 0.4 mg under the tongue every 5 (five) minutes as needed for chest pain.       Marland Kitchen omega-3 acid ethyl esters (LOVAZA) 1 G capsule Take 2 g by mouth daily.      Marland Kitchen PARoxetine (PAXIL) 20 MG tablet Take 20 mg by mouth every morning.      . ranolazine (RANEXA) 500 MG 12 hr tablet Take 1 tablet (500 mg total) by mouth 2 (two) times daily.  60 tablet  5   No current facility-administered medications for this visit.    Allergies  Allergen Reactions  . Codeine Itching    History   Social History  . Marital Status:  Widowed    Spouse Name: N/A    Number of Children: N/A  . Years of Education: N/A   Occupational History  . Not on file.   Social History Main Topics  . Smoking status: Former Smoker    Types: Cigarettes    Quit date: 05/15/1991  . Smokeless tobacco: Never Used  . Alcohol Use: No  . Drug Use: No  . Sexual Activity: No   Other Topics Concern  . Not on file   Social History Narrative  . No narrative on file     Review of Systems: General: negative for chills, fever, night sweats or weight changes.  Cardiovascular: negative for chest pain, dyspnea on exertion, edema, orthopnea, palpitations, paroxysmal nocturnal dyspnea or shortness of breath Dermatological: negative for rash Respiratory: negative for cough or wheezing Urologic: negative for hematuria Abdominal: negative for nausea, vomiting, diarrhea, bright red blood per rectum, melena, or hematemesis Neurologic: negative for visual changes, syncope, or dizziness All other systems reviewed and are otherwise negative except as noted above.    Blood pressure 118/62, pulse 64, height 5' (1.524 m), weight 141 lb (63.957 kg).  General appearance: alert and no distress Neck: no adenopathy, no JVD, supple, symmetrical, trachea midline, thyroid not enlarged, symmetric, no tenderness/mass/nodules and bbilateral carotid bruits Lungs: clear to auscultation bilaterally Heart: regular rate and rhythm, S1, S2 normal, no murmur, click, rub or gallop Extremities: extremities normal, atraumatic, no cyanosis or edema  EKG not performed today  ASSESSMENT AND PLAN:   CAD, CABG X 2 '93- Last cath 05/2012- no change from 2011, medical Rx- EF of 60-65% via echo 05/2012 Status post remote coronary artery bypass grafting in 1993 with veins to the RCA and circumflex. She did have multiple times most recently 05/30/2012 when I documented an occluded dominant RCA with left to right collaterals and a 30% OM branch stenosis with normal LV function.  She denies chest pain or shortness of breath.  Chronic atrial fibrillation- ventricular rate is controlled Rate controlled on aspirin and Plavix  Atherosclerosis of native arteries of the extremities with intermittent claudication Status post remote aorto bifemoral bypass graft which occluded, now status post right ax fem ./fem-fem crossover graft by Dr. Betti Cruz 10/08/12. Her claudication has resolved  Essential hypertension Well-controlled on current medications  Dyslipidemia On statin therapy. Followed by her PCP      Delton See.  Allyson Sabal MD [facility], [facility] 03/12/2013 9:17 AM

## 2013-03-12 NOTE — Assessment & Plan Note (Signed)
Rate controlled on aspirin and Plavix

## 2013-03-12 NOTE — Patient Instructions (Signed)
Your physician wants you to follow-up in: 6 months with an extender and 1 year with Dr Berry. You will receive a reminder letter in the mail two months in advance. If you don't receive a letter, please call our office to schedule the follow-up appointment.  

## 2013-03-12 NOTE — Assessment & Plan Note (Signed)
Status post remote aorto bifemoral bypass graft which occluded, now status post right ax fem ./fem-fem crossover graft by Dr. Betti Cruz 10/08/12. Her claudication has resolved

## 2013-03-12 NOTE — Assessment & Plan Note (Signed)
Status post remote coronary artery bypass grafting in 1993 with veins to the RCA and circumflex. She did have multiple times most recently 05/30/2012 when I documented an occluded dominant RCA with left to right collaterals and a 30% OM branch stenosis with normal LV function. She denies chest pain or shortness of breath.

## 2013-03-19 ENCOUNTER — Other Ambulatory Visit: Payer: Self-pay

## 2013-07-14 ENCOUNTER — Ambulatory Visit: Payer: Self-pay | Admitting: Vascular Surgery

## 2013-07-14 ENCOUNTER — Other Ambulatory Visit (HOSPITAL_COMMUNITY): Payer: Self-pay

## 2013-07-21 ENCOUNTER — Ambulatory Visit: Payer: Self-pay | Admitting: Vascular Surgery

## 2013-07-21 ENCOUNTER — Other Ambulatory Visit (HOSPITAL_COMMUNITY): Payer: Self-pay

## 2013-08-10 ENCOUNTER — Other Ambulatory Visit: Payer: Self-pay | Admitting: Vascular Surgery

## 2013-08-10 DIAGNOSIS — Z48812 Encounter for surgical aftercare following surgery on the circulatory system: Secondary | ICD-10-CM

## 2013-08-10 DIAGNOSIS — I70219 Atherosclerosis of native arteries of extremities with intermittent claudication, unspecified extremity: Secondary | ICD-10-CM

## 2013-08-24 ENCOUNTER — Encounter: Payer: Self-pay | Admitting: Vascular Surgery

## 2013-08-25 ENCOUNTER — Encounter: Payer: Self-pay | Admitting: Vascular Surgery

## 2013-08-25 ENCOUNTER — Ambulatory Visit (INDEPENDENT_AMBULATORY_CARE_PROVIDER_SITE_OTHER): Payer: Medicare Other | Admitting: Vascular Surgery

## 2013-08-25 ENCOUNTER — Ambulatory Visit (HOSPITAL_COMMUNITY)
Admission: RE | Admit: 2013-08-25 | Discharge: 2013-08-25 | Disposition: A | Discharge status: Home / Self Care | Payer: Medicare Other | Source: Ambulatory Visit | Attending: Vascular Surgery | Admitting: Vascular Surgery

## 2013-08-25 VITALS — BP 129/64 | HR 70 | Ht 60.0 in | Wt 140.0 lb

## 2013-08-25 DIAGNOSIS — I70219 Atherosclerosis of native arteries of extremities with intermittent claudication, unspecified extremity: Secondary | ICD-10-CM | POA: Insufficient documentation

## 2013-08-25 DIAGNOSIS — I739 Peripheral vascular disease, unspecified: Secondary | ICD-10-CM

## 2013-08-25 DIAGNOSIS — Z48812 Encounter for surgical aftercare following surgery on the circulatory system: Secondary | ICD-10-CM | POA: Insufficient documentation

## 2013-08-25 NOTE — Progress Notes (Signed)
Subjective:     Patient ID: Lavinia SharpsMary Noboa, female   DOB: 1927-05-16, 80 y.o.   MRN: [id number]  HPI this 80 year old female returns for continued followup regarding her right axillobifemoral bypass graft performed by me 10/08/2012. She had previously undergone aortobifemoral bypass grafting by me in 1994 and this eventually occluded. She has no complaints regarding her lower extremities. She does complain of generalized fatigue. She is in a retirement facility. He denies any rest pain or history of nonhealing ulcers or infection.  Past Medical History  Diagnosis Date  . Diabetes mellitus without complication   . Hypertension   . Coronary artery disease   . Thyroid disease   . Depression   . Memory loss   . Skin cancer   . PVD (peripheral vascular disease)   . CHF (congestive heart failure)   . Atrial fibrillation   . Myocardial infarction   . Dyspnea 06/01/2012    2D Echo - EF 60-65, mitral valve calcified annulus, left atrium severely dilated, right atrium moderately dilated  . Chest pain 10/07/2009    2D Echo - EF 50-55, mitral valve calcified annulus  . CAD (coronary artery disease) 03/01/2008    Lexiscan - EF 79, no ECG changes/EKG negative for ischemia, post-stress EF 86  . Cerebral atherosclerosis 10/15/2011    Extracranial Examination - mild-moderate amount smooth mixed density plaque elevating velocities within the proximal and mid segments of the internal carotid artery  . Claudication 09/01/2010    LEA Doppler - Bilateral ABIs-demonstrated moderate arterial insufficiency to the lower extremities at rest, Right CIA/EIA-known occlusive disease, PV Cath  . Anginal pain     not in in last month  . Dementia   . GERD (gastroesophageal reflux disease)     takes prevacid  . Arthritis     History  Substance Use Topics  . Smoking status: Former Smoker    Types: Cigarettes    Quit date: 05/15/1991  . Smokeless tobacco: Never Used  . Alcohol Use: No    Family History  Problem  Relation Age of Onset  . Heart disease Father   . Hypertension Father   . Heart attack Father   . Kidney failure Brother   . Hyperlipidemia Daughter     Allergies  Allergen Reactions  . Codeine Itching    Current outpatient prescriptions:acetaminophen (TYLENOL) 325 MG tablet, Take 650 mg by mouth every 4 (four) hours as needed for pain. , Disp: , Rfl: ;  albuterol (PROVENTIL HFA;VENTOLIN HFA) 108 (90 BASE) MCG/ACT inhaler, Inhale 1-2 puffs into the lungs every 6 (six) hours as needed for wheezing., Disp: 1 Inhaler, Rfl: 0;  amLODipine (NORVASC) 5 MG tablet, Take 5 mg by mouth daily., Disp: , Rfl:  aspirin 81 MG chewable tablet, Chew 81 mg by mouth daily., Disp: , Rfl: ;  atorvastatin (LIPITOR) 40 MG tablet, Take 40 mg by mouth at bedtime. , Disp: , Rfl: ;  cholecalciferol (VITAMIN D) 1000 UNITS tablet, Take 1,000 Units by mouth daily., Disp: , Rfl: ;  clopidogrel (PLAVIX) 75 MG tablet, Take 75 mg by mouth daily., Disp: , Rfl: ;  diazepam (VALIUM) 5 MG tablet, Take 5 mg by mouth 2 (two) times daily., Disp: , Rfl:  diphenhydrAMINE (BENADRYL) 12.5 MG/5ML elixir, Take 12.5 mg by mouth as needed (for allergic reaction to Norco). , Disp: , Rfl: ;  donepezil (ARICEPT) 5 MG tablet, Take 5 mg by mouth daily., Disp: , Rfl: ;  ferrous sulfate 325 (65 FE) MG tablet, Take 325  mg by mouth daily with breakfast., Disp: , Rfl:  HYDROcodone-acetaminophen (NORCO/VICODIN) 5-325 MG per tablet, Take 1 tablet by mouth every 6 (six) hours as needed for pain., Disp: 40 tablet, Rfl: 0;  isosorbide mononitrate (IMDUR) 30 MG 24 hr tablet, Take 30 mg by mouth at bedtime., Disp: , Rfl: ;  lansoprazole (PREVACID) 30 MG capsule, Take 30 mg by mouth 2 (two) times daily., Disp: , Rfl:  levothyroxine (SYNTHROID, LEVOTHROID) 50 MCG tablet, Take 50 mcg by mouth daily before breakfast., Disp: , Rfl: ;  losartan (COZAAR) 100 MG tablet, Take 100 mg by mouth daily., Disp: , Rfl: ;  Melatonin 3 MG TABS, Take 3 mg by mouth at bedtime., Disp:  , Rfl: ;  metoprolol tartrate (LOPRESSOR) 25 MG tablet, Take 25 mg by mouth at bedtime., Disp: , Rfl: ;  Naproxen Sodium 220 MG CAPS, Take 220 mg by mouth daily with breakfast., Disp: , Rfl:  nitroGLYCERIN (NITROSTAT) 0.4 MG SL tablet, Place 0.4 mg under the tongue every 5 (five) minutes as needed for chest pain. , Disp: , Rfl: ;  omega-3 acid ethyl esters (LOVAZA) 1 G capsule, Take 2 g by mouth daily., Disp: , Rfl: ;  PARoxetine (PAXIL) 20 MG tablet, Take 20 mg by mouth every morning., Disp: , Rfl: ;  ranolazine (RANEXA) 500 MG 12 hr tablet, Take 1 tablet (500 mg total) by mouth 2 (two) times daily., Disp: 60 tablet, Rfl: 5  BP 129/64  Pulse 70  Ht 5' (1.524 m)  Wt 140 lb (63.504 kg)  BMI 27.34 kg/m2  SpO2 97%  Body mass index is 27.34 kg/(m^2).           Review of Systems denies chest pain, dyspnea on exertion but does complain of easy fatigability. Walks with a walker. Has mild distal edema bilaterally. Other systems negative and complete review of system    Objective:   Physical Exam BP 129/64  Pulse 70  Ht 5' (1.524 m)  Wt 140 lb (63.504 kg)  BMI 27.34 kg/m2  SpO2 97%  Gen.-alert and oriented x3 in no apparent distress HEENT normal for age Lungs no rhonchi or wheezing Cardiovascular regular rhythm no murmurs carotid pulses 3+ palpable no bruits audible Abdomen soft nontender no palpable masses Musculoskeletal free of  major deformities Skin clear -no rashes Neurologic normal Lower extremities 3+ femoral and dorsalis pedis pulses palpable bilaterally with mild edema-one plus  Today I ordered bilateral ABIs. Right leg is 0.9 for left leg 0.99 with excellent waveforms distally.       Assessment:     Widely patent right axillofemoral and right-to-left femoral-femoral bypass with no evidence of ischemia and no claudication symptoms    Plan:     Return in one year for repeat ABIs incision nurse practitioner

## 2014-02-25 ENCOUNTER — Encounter (HOSPITAL_BASED_OUTPATIENT_CLINIC_OR_DEPARTMENT_OTHER): Payer: Self-pay | Admitting: Emergency Medicine

## 2014-02-25 ENCOUNTER — Emergency Department (HOSPITAL_BASED_OUTPATIENT_CLINIC_OR_DEPARTMENT_OTHER): Payer: Medicare Other

## 2014-02-25 ENCOUNTER — Emergency Department (HOSPITAL_BASED_OUTPATIENT_CLINIC_OR_DEPARTMENT_OTHER)
Admission: EM | Admit: 2014-02-25 | Discharge: 2014-02-25 | Disposition: A | Discharge status: Skilled Nursing Facility | Payer: Medicare Other | Attending: Emergency Medicine | Admitting: Emergency Medicine

## 2014-02-25 DIAGNOSIS — Z87891 Personal history of nicotine dependence: Secondary | ICD-10-CM | POA: Insufficient documentation

## 2014-02-25 DIAGNOSIS — Y9389 Activity, other specified: Secondary | ICD-10-CM | POA: Diagnosis not present

## 2014-02-25 DIAGNOSIS — I739 Peripheral vascular disease, unspecified: Secondary | ICD-10-CM | POA: Diagnosis not present

## 2014-02-25 DIAGNOSIS — I4891 Unspecified atrial fibrillation: Secondary | ICD-10-CM | POA: Diagnosis not present

## 2014-02-25 DIAGNOSIS — Z79899 Other long term (current) drug therapy: Secondary | ICD-10-CM | POA: Diagnosis not present

## 2014-02-25 DIAGNOSIS — R509 Fever, unspecified: Secondary | ICD-10-CM | POA: Diagnosis not present

## 2014-02-25 DIAGNOSIS — Z951 Presence of aortocoronary bypass graft: Secondary | ICD-10-CM | POA: Insufficient documentation

## 2014-02-25 DIAGNOSIS — W01190A Fall on same level from slipping, tripping and stumbling with subsequent striking against furniture, initial encounter: Secondary | ICD-10-CM | POA: Diagnosis not present

## 2014-02-25 DIAGNOSIS — I252 Old myocardial infarction: Secondary | ICD-10-CM | POA: Diagnosis not present

## 2014-02-25 DIAGNOSIS — S0990XA Unspecified injury of head, initial encounter: Secondary | ICD-10-CM | POA: Diagnosis present

## 2014-02-25 DIAGNOSIS — I10 Essential (primary) hypertension: Secondary | ICD-10-CM | POA: Diagnosis not present

## 2014-02-25 DIAGNOSIS — E079 Disorder of thyroid, unspecified: Secondary | ICD-10-CM | POA: Diagnosis not present

## 2014-02-25 DIAGNOSIS — K219 Gastro-esophageal reflux disease without esophagitis: Secondary | ICD-10-CM | POA: Diagnosis not present

## 2014-02-25 DIAGNOSIS — I509 Heart failure, unspecified: Secondary | ICD-10-CM | POA: Insufficient documentation

## 2014-02-25 DIAGNOSIS — M1990 Unspecified osteoarthritis, unspecified site: Secondary | ICD-10-CM | POA: Insufficient documentation

## 2014-02-25 DIAGNOSIS — I2510 Atherosclerotic heart disease of native coronary artery without angina pectoris: Secondary | ICD-10-CM | POA: Insufficient documentation

## 2014-02-25 DIAGNOSIS — Z7952 Long term (current) use of systemic steroids: Secondary | ICD-10-CM | POA: Diagnosis not present

## 2014-02-25 DIAGNOSIS — S0003XA Contusion of scalp, initial encounter: Secondary | ICD-10-CM | POA: Diagnosis not present

## 2014-02-25 DIAGNOSIS — Y92128 Other place in nursing home as the place of occurrence of the external cause: Secondary | ICD-10-CM | POA: Insufficient documentation

## 2014-02-25 DIAGNOSIS — W19XXXA Unspecified fall, initial encounter: Secondary | ICD-10-CM

## 2014-02-25 DIAGNOSIS — F0390 Unspecified dementia without behavioral disturbance: Secondary | ICD-10-CM | POA: Diagnosis not present

## 2014-02-25 DIAGNOSIS — F329 Major depressive disorder, single episode, unspecified: Secondary | ICD-10-CM | POA: Insufficient documentation

## 2014-02-25 DIAGNOSIS — Z9889 Other specified postprocedural states: Secondary | ICD-10-CM | POA: Diagnosis not present

## 2014-02-25 DIAGNOSIS — E119 Type 2 diabetes mellitus without complications: Secondary | ICD-10-CM | POA: Diagnosis not present

## 2014-02-25 LAB — URINALYSIS, ROUTINE W REFLEX MICROSCOPIC
Bilirubin Urine: NEGATIVE
Glucose, UA: NEGATIVE mg/dL
Hgb urine dipstick: NEGATIVE
Ketones, ur: NEGATIVE mg/dL
NITRITE: NEGATIVE
PH: 6 (ref 5.0–8.0)
Protein, ur: NEGATIVE mg/dL
SPECIFIC GRAVITY, URINE: 1.014 (ref 1.005–1.030)
Urobilinogen, UA: 0.2 mg/dL (ref 0.0–1.0)

## 2014-02-25 LAB — CBC WITH DIFFERENTIAL/PLATELET
BASOS PCT: 0 % (ref 0–1)
Basophils Absolute: 0 10*3/uL (ref 0.0–0.1)
Eosinophils Absolute: 0.2 10*3/uL (ref 0.0–0.7)
Eosinophils Relative: 3 % (ref 0–5)
HEMATOCRIT: 37.8 % (ref 36.0–46.0)
Hemoglobin: 12.6 g/dL (ref 12.0–15.0)
LYMPHS PCT: 22 % (ref 12–46)
Lymphs Abs: 1.6 10*3/uL (ref 0.7–4.0)
MCH: 30.4 pg (ref 26.0–34.0)
MCHC: 33.3 g/dL (ref 30.0–36.0)
MCV: 91.3 fL (ref 78.0–100.0)
MONOS PCT: 9 % (ref 3–12)
Monocytes Absolute: 0.6 10*3/uL (ref 0.1–1.0)
NEUTROS ABS: 4.7 10*3/uL (ref 1.7–7.7)
NEUTROS PCT: 66 % (ref 43–77)
Platelets: 147 10*3/uL — ABNORMAL LOW (ref 150–400)
RBC: 4.14 MIL/uL (ref 3.87–5.11)
RDW: 12.8 % (ref 11.5–15.5)
WBC: 7.2 10*3/uL (ref 4.0–10.5)

## 2014-02-25 LAB — URINE MICROSCOPIC-ADD ON

## 2014-02-25 LAB — BASIC METABOLIC PANEL
ANION GAP: 12 (ref 5–15)
BUN: 13 mg/dL (ref 6–23)
CHLORIDE: 102 meq/L (ref 96–112)
CO2: 26 meq/L (ref 19–32)
Calcium: 8.8 mg/dL (ref 8.4–10.5)
Creatinine, Ser: 0.8 mg/dL (ref 0.50–1.10)
GFR calc Af Amer: 76 mL/min — ABNORMAL LOW (ref >90)
GFR calc non Af Amer: 66 mL/min — ABNORMAL LOW (ref >90)
Glucose, Bld: 126 mg/dL — ABNORMAL HIGH (ref 70–99)
POTASSIUM: 4 meq/L (ref 3.7–5.3)
Sodium: 140 mEq/L (ref 137–147)

## 2014-02-25 NOTE — ED Notes (Signed)
Pt arrived via [facility] fell at nsg when reaching for door and hit back of head. No obvious injury. No other complaints from fall  States she has had low grade fever (99) and not feeling well x 1 week. No n/v/d. States she has mostly just felt weak.

## 2014-02-25 NOTE — ED Notes (Signed)
Report called to [employer]/gallery exhibitions officer(supervisor).

## 2014-02-25 NOTE — ED Provider Notes (Signed)
CSN: [id number]     Arrival date & time 02/25/14  1658 History   First MD Initiated Contact with Patient 02/25/14 1718     Chief Complaint  Patient presents with  . Fall     HPI Pt arrived via [facility] fell at nsg when reaching for door and hit back of head. No obvious injury. No other complaints from fall States she has had low grade fever (99) and not feeling well x 1 week. No n/v/d. States she has mostly just felt weak.  Past Medical History  Diagnosis Date  . Diabetes mellitus without complication   . Hypertension   . Coronary artery disease   . Thyroid disease   . Depression   . Memory loss   . PVD (peripheral vascular disease)   . CHF (congestive heart failure)   . Atrial fibrillation   . Myocardial infarction   . Dyspnea 06/01/2012    2D Echo - EF 60-65, mitral valve calcified annulus, left atrium severely dilated, right atrium moderately dilated  . Chest pain 10/07/2009    2D Echo - EF 50-55, mitral valve calcified annulus  . CAD (coronary artery disease) 03/01/2008    Lexiscan - EF 79, no ECG changes/EKG negative for ischemia, post-stress EF 86  . Cerebral atherosclerosis 10/15/2011    Extracranial Examination - mild-moderate amount smooth mixed density plaque elevating velocities within the proximal and mid segments of the internal carotid artery  . Claudication 09/01/2010    LEA Doppler - Bilateral ABIs-demonstrated moderate arterial insufficiency to the lower extremities at rest, Right CIA/EIA-known occlusive disease, PV Cath  . Anginal pain     not in in last month  . Dementia   . GERD (gastroesophageal reflux disease)     takes prevacid  . Arthritis    Past Surgical History  Procedure Laterality Date  . Abdominal hysterectomy    . Aorto- femoral bipass  2011  . Joint replacement Left 2010    knee  . Cardiac catheterization  2012  . Cardiac catheterization  06/02/2012    Unchanged anatomy compared to 3 years ago, continue medical therapy  . Cardiac  catheterization  10/06/2009    Occluded right coronary artery, left-to-right collateral noncritical left disease with occluded grafts  . Angioplasty Left 02/23/2010    Darcon patch, 80-90% stenosis proximal to the anastomosis  . Coronary artery bypass graft  1993  . Coronary angioplasty    . Eye surgery    . Axillary-femoral bypass graft Right 10/08/2012    Procedure: BYPASS GRAFT AXILLA-BIFEMORAL;  Surgeon: Pryor OchoaJames D Lawson, MD;  [facility];  Service: Vascular;  Laterality: Right;   Family History  Problem Relation Age of Onset  . Heart disease Father   . Hypertension Father   . Heart attack Father   . Kidney failure Brother   . Hyperlipidemia Daughter    History  Substance Use Topics  . Smoking status: Former Smoker    Types: Cigarettes    Quit date: 05/15/1991  . Smokeless tobacco: Never Used  . Alcohol Use: No   OB History   Grav Para Term Preterm Abortions TAB SAB Ect Mult Living                 Review of Systems All other systems reviewed and are negative   Allergies  Codeine  Home Medications   Prior to Admission medications   Medication Sig Start Date End Date Taking? Authorizing Provider  acetaminophen (TYLENOL) 325 MG tablet Take 650 mg  by mouth every 4 (four) hours as needed for pain.    Yes Historical Provider, MD  aspirin 81 MG chewable tablet Chew 81 mg by mouth daily.   Yes Historical Provider, MD  atorvastatin (LIPITOR) 40 MG tablet Take 40 mg by mouth at bedtime.    Yes Historical Provider, MD  cholecalciferol (VITAMIN D) 1000 UNITS tablet Take 1,000 Units by mouth daily.   Yes Historical Provider, MD  clopidogrel (PLAVIX) 75 MG tablet Take 75 mg by mouth daily.   Yes Historical Provider, MD  diazepam (VALIUM) 5 MG tablet Take 5 mg by mouth 2 (two) times daily.   Yes Historical Provider, MD  diphenhydrAMINE (BENADRYL) 12.5 MG/5ML elixir Take 12.5 mg by mouth as needed (for allergic reaction to Norco).    Yes Historical Provider, MD  donepezil  (ARICEPT) 5 MG tablet Take 5 mg by mouth daily.   Yes Historical Provider, MD  HYDROcodone-acetaminophen (NORCO/VICODIN) 5-325 MG per tablet Take 1 tablet by mouth every 6 (six) hours as needed for pain. 10/13/12  Yes Lars MageEmma M Collins, PA-C  lansoprazole (PREVACID) 30 MG capsule Take 30 mg by mouth 2 (two) times daily.   Yes Historical Provider, MD  levothyroxine (SYNTHROID, LEVOTHROID) 50 MCG tablet Take 50 mcg by mouth daily before breakfast.   Yes Historical Provider, MD  losartan (COZAAR) 100 MG tablet Take 100 mg by mouth daily.   Yes Historical Provider, MD  Melatonin 3 MG TABS Take 3 mg by mouth at bedtime.   Yes Historical Provider, MD  metoprolol tartrate (LOPRESSOR) 25 MG tablet Take 25 mg by mouth at bedtime.   Yes Historical Provider, MD  Naproxen Sodium 220 MG CAPS Take 220 mg by mouth daily with breakfast.   Yes Historical Provider, MD  nitroGLYCERIN (NITROSTAT) 0.4 MG SL tablet Place 0.4 mg under the tongue every 5 (five) minutes as needed for chest pain.    Yes Historical Provider, MD  omega-3 acid ethyl esters (LOVAZA) 1 G capsule Take 2 g by mouth daily.   Yes Historical Provider, MD  PARoxetine (PAXIL) 20 MG tablet Take 20 mg by mouth every morning.   Yes Historical Provider, MD  pregabalin (LYRICA) 25 MG capsule Take 25 mg by mouth 2 (two) times daily.   Yes Historical Provider, MD  ranolazine (RANEXA) 500 MG 12 hr tablet Take 1 tablet (500 mg total) by mouth 2 (two) times daily. 08/12/12  Yes Brittainy Sherlynn CarbonM Simmons, PA-C  albuterol (PROVENTIL HFA;VENTOLIN HFA) 108 (90 BASE) MCG/ACT inhaler Inhale 1-2 puffs into the lungs every 6 (six) hours as needed for wheezing. 12/31/12   Dorthula Matasiffany G Greene, PA-C  amLODipine (NORVASC) 5 MG tablet Take 5 mg by mouth daily.    Historical Provider, MD  ferrous sulfate 325 (65 FE) MG tablet Take 325 mg by mouth daily with breakfast.    Historical Provider, MD  isosorbide mononitrate (IMDUR) 30 MG 24 hr tablet Take 30 mg by mouth at bedtime.    Historical  Provider, MD   BP 111/73  Pulse 75  Temp(Src) 98.7 F (37.1 C) (Oral)  Resp 16  Ht 5' (1.524 m)  Wt 119 lb (53.978 kg)  BMI 23.24 kg/m2  SpO2 94% Physical Exam Physical Exam  Nursing note and vitals reviewed. Constitutional: She is oriented to person, place, and time. She appears well-developed and well-nourished. No distress.  HENT:  Head: Normocephalic small scalp hematoma  Eyes: Pupils are equal, round, and reactive to light.  Neck: Normal range of motion.  Cardiovascular:  Normal rate and intact distal pulses.   Pulmonary/Chest: No respiratory distress.  Abdominal: Normal appearance. She exhibits no distension.  Musculoskeletal: Normal range of motion.  Neurological: She is alert and oriented to person, place, and time. No cranial nerve deficit.  no focal or lateralizing neurologic findings Skin: Skin is warm and dry. No rash noted.  Psychiatric: She has a normal mood and affect. Her behavior is normal.   ED Course  Procedures (including critical care time) Labs Review Labs Reviewed  CBC WITH DIFFERENTIAL - Abnormal; Notable for the following:    Platelets 147 (*)    All other components within normal limits  BASIC METABOLIC PANEL - Abnormal; Notable for the following:    Glucose, Bld 126 (*)    GFR calc non Af Amer 66 (*)    GFR calc Af Amer 76 (*)    All other components within normal limits  URINALYSIS, ROUTINE W REFLEX MICROSCOPIC - Abnormal; Notable for the following:    Leukocytes, UA SMALL (*)    All other components within normal limits  URINE MICROSCOPIC-ADD ON - Abnormal; Notable for the following:    Squamous Epithelial / LPF FEW (*)    All other components within normal limits    Imaging Review Ct Head Wo Contrast  02/25/2014   CLINICAL DATA:  Fall, posterior head injury  EXAM: CT HEAD WITHOUT CONTRAST  TECHNIQUE: Contiguous axial images were obtained from the base of the skull through the vertex without intravenous contrast.  COMPARISON:  None.   FINDINGS: No evidence of parenchymal hemorrhage or extra-axial fluid collection. No mass lesion, mass effect, or midline shift.  No CT evidence of acute infarction.  Subcortical white matter and periventricular small vessel ischemic changes. Intracranial atherosclerosis.  Global cortical atrophy.  No ventriculomegaly.  The visualized paranasal sinuses are essentially clear. The mastoid air cells are unopacified.  No evidence of calvarial fracture.  IMPRESSION: No evidence of acute intracranial abnormality.  Atrophy with small vessel ischemic changes and intracranial atherosclerosis.   Electronically Signed   By: Charline Bills M.D.   On: 02/25/2014 18:38      MDM   Final diagnoses:  Fall, initial encounter        Nelia Shi, MD 02/25/14 [phone]

## 2014-02-25 NOTE — ED Notes (Signed)
Pt daughter Eber JonesCarolyn called wanted caregivers to know pt had xrays from primary MD and was told she had a previous mini stroke unknown when and also that pt's legs were bothering her and she had knots on back of legs, just started on lyrica 1 wk ago. But lyrica had helped the neuropathy. Dr Radford PaxBeaton aware.

## 2014-02-25 NOTE — ED Notes (Signed)
Talked with pt about Daughter [name] Short wanting to know results of labs and xrays and pt states it is okay to call her daughter and tell her. Daughter [name] Short called with results of tests and that pt will be discharged as soon as an ambulance is available.

## 2014-02-25 NOTE — ED Notes (Signed)
Daughter called to talk with pt. Phone given to pt.

## 2014-02-25 NOTE — Discharge Instructions (Signed)

## 2014-03-12 ENCOUNTER — Emergency Department (HOSPITAL_BASED_OUTPATIENT_CLINIC_OR_DEPARTMENT_OTHER)
Admission: EM | Admit: 2014-03-12 | Discharge: 2014-03-12 | Disposition: A | Discharge status: Home / Self Care | Payer: Medicare Other | Attending: Emergency Medicine | Admitting: Emergency Medicine

## 2014-03-12 ENCOUNTER — Encounter (HOSPITAL_BASED_OUTPATIENT_CLINIC_OR_DEPARTMENT_OTHER): Payer: Self-pay | Admitting: Emergency Medicine

## 2014-03-12 DIAGNOSIS — I252 Old myocardial infarction: Secondary | ICD-10-CM | POA: Diagnosis not present

## 2014-03-12 DIAGNOSIS — F0390 Unspecified dementia without behavioral disturbance: Secondary | ICD-10-CM | POA: Diagnosis not present

## 2014-03-12 DIAGNOSIS — Z79899 Other long term (current) drug therapy: Secondary | ICD-10-CM | POA: Insufficient documentation

## 2014-03-12 DIAGNOSIS — E079 Disorder of thyroid, unspecified: Secondary | ICD-10-CM | POA: Diagnosis not present

## 2014-03-12 DIAGNOSIS — M1990 Unspecified osteoarthritis, unspecified site: Secondary | ICD-10-CM | POA: Insufficient documentation

## 2014-03-12 DIAGNOSIS — W19XXXA Unspecified fall, initial encounter: Secondary | ICD-10-CM

## 2014-03-12 DIAGNOSIS — F329 Major depressive disorder, single episode, unspecified: Secondary | ICD-10-CM | POA: Insufficient documentation

## 2014-03-12 DIAGNOSIS — E119 Type 2 diabetes mellitus without complications: Secondary | ICD-10-CM | POA: Diagnosis not present

## 2014-03-12 DIAGNOSIS — I10 Essential (primary) hypertension: Secondary | ICD-10-CM | POA: Diagnosis not present

## 2014-03-12 DIAGNOSIS — Z7902 Long term (current) use of antithrombotics/antiplatelets: Secondary | ICD-10-CM | POA: Insufficient documentation

## 2014-03-12 DIAGNOSIS — K219 Gastro-esophageal reflux disease without esophagitis: Secondary | ICD-10-CM | POA: Diagnosis not present

## 2014-03-12 DIAGNOSIS — Z87891 Personal history of nicotine dependence: Secondary | ICD-10-CM | POA: Insufficient documentation

## 2014-03-12 DIAGNOSIS — I509 Heart failure, unspecified: Secondary | ICD-10-CM | POA: Insufficient documentation

## 2014-03-12 DIAGNOSIS — Z9889 Other specified postprocedural states: Secondary | ICD-10-CM | POA: Insufficient documentation

## 2014-03-12 DIAGNOSIS — Z043 Encounter for examination and observation following other accident: Secondary | ICD-10-CM | POA: Diagnosis not present

## 2014-03-12 DIAGNOSIS — Z9861 Coronary angioplasty status: Secondary | ICD-10-CM | POA: Diagnosis not present

## 2014-03-12 DIAGNOSIS — Y9389 Activity, other specified: Secondary | ICD-10-CM | POA: Diagnosis not present

## 2014-03-12 DIAGNOSIS — I2510 Atherosclerotic heart disease of native coronary artery without angina pectoris: Secondary | ICD-10-CM | POA: Insufficient documentation

## 2014-03-12 DIAGNOSIS — Z7982 Long term (current) use of aspirin: Secondary | ICD-10-CM | POA: Diagnosis not present

## 2014-03-12 DIAGNOSIS — Y9289 Other specified places as the place of occurrence of the external cause: Secondary | ICD-10-CM | POA: Insufficient documentation

## 2014-03-12 DIAGNOSIS — I4891 Unspecified atrial fibrillation: Secondary | ICD-10-CM | POA: Insufficient documentation

## 2014-03-12 DIAGNOSIS — Z951 Presence of aortocoronary bypass graft: Secondary | ICD-10-CM | POA: Diagnosis not present

## 2014-03-12 DIAGNOSIS — W1830XA Fall on same level, unspecified, initial encounter: Secondary | ICD-10-CM | POA: Diagnosis not present

## 2014-03-12 NOTE — ED Notes (Signed)
Pt walked to restroom, pt still waiting for PTAR

## 2014-03-12 NOTE — Discharge Instructions (Signed)
Recheck with any symptoms, or concerns.

## 2014-03-12 NOTE — ED Notes (Signed)
Pt ambulated to restroom with assistance, still waiting on PTAR

## 2014-03-12 NOTE — ED Notes (Signed)
Pt sates she slipped and fell, denies any LOC.  PT states she didn't get dizzy and pass out and she didn't want to come.

## 2014-03-12 NOTE — ED Provider Notes (Addendum)
CSN: [id number]     Arrival date & time 03/12/14  1637 History   First MD Initiated Contact with Patient 03/12/14 1641     Chief Complaint  Patient presents with  . Fall      HPI  Pt presents via EMS after a fall her care facility. She states that she turned to grab her walker. States she did not set one of the brakes. It pushed forward. She fell to the floor. She essentially landed on top of her dental legs. She did not strike her head. No loss of consciousness. He was going to get a Coke from a freshman area. She states she tried to quickly get up for the staph spotted her because "I knew they would want me to come here". She has no complaints, and has been ambulatory.  Past Medical History  Diagnosis Date  . Diabetes mellitus without complication   . Hypertension   . Coronary artery disease   . Thyroid disease   . Depression   . Memory loss   . PVD (peripheral vascular disease)   . CHF (congestive heart failure)   . Atrial fibrillation   . Myocardial infarction   . Dyspnea 06/01/2012    2D Echo - EF 60-65, mitral valve calcified annulus, left atrium severely dilated, right atrium moderately dilated  . Chest pain 10/07/2009    2D Echo - EF 50-55, mitral valve calcified annulus  . CAD (coronary artery disease) 03/01/2008    Lexiscan - EF 79, no ECG changes/EKG negative for ischemia, post-stress EF 86  . Cerebral atherosclerosis 10/15/2011    Extracranial Examination - mild-moderate amount smooth mixed density plaque elevating velocities within the proximal and mid segments of the internal carotid artery  . Claudication 09/01/2010    LEA Doppler - Bilateral ABIs-demonstrated moderate arterial insufficiency to the lower extremities at rest, Right CIA/EIA-known occlusive disease, PV Cath  . Anginal pain     not in in last month  . Dementia   . GERD (gastroesophageal reflux disease)     takes prevacid  . Arthritis    Past Surgical History  Procedure Laterality Date  . Abdominal  hysterectomy    . Aorto- femoral bipass  2011  . Joint replacement Left 2010    knee  . Cardiac catheterization  2012  . Cardiac catheterization  06/02/2012    Unchanged anatomy compared to 3 years ago, continue medical therapy  . Cardiac catheterization  10/06/2009    Occluded right coronary artery, left-to-right collateral noncritical left disease with occluded grafts  . Angioplasty Left 02/23/2010    Darcon patch, 80-90% stenosis proximal to the anastomosis  . Coronary artery bypass graft  1993  . Coronary angioplasty    . Eye surgery    . Axillary-femoral bypass graft Right 10/08/2012    Procedure: BYPASS GRAFT AXILLA-BIFEMORAL;  Surgeon: Pryor OchoaJames D Lawson, MD;  [facility];  Service: Vascular;  Laterality: Right;   Family History  Problem Relation Age of Onset  . Heart disease Father   . Hypertension Father   . Heart attack Father   . Kidney failure Brother   . Hyperlipidemia Daughter    History  Substance Use Topics  . Smoking status: Former Smoker    Types: Cigarettes    Quit date: 05/15/1991  . Smokeless tobacco: Never Used  . Alcohol Use: No   OB History    No data available     Review of Systems  Constitutional: Negative for fever, chills, diaphoresis, appetite  change and fatigue.  HENT: Negative for mouth sores, sore throat and trouble swallowing.   Eyes: Negative for visual disturbance.  Respiratory: Negative for cough, chest tightness, shortness of breath and wheezing.   Cardiovascular: Negative for chest pain.  Gastrointestinal: Negative for nausea, vomiting, abdominal pain, diarrhea and abdominal distention.  Endocrine: Negative for polydipsia, polyphagia and polyuria.  Genitourinary: Negative for dysuria, frequency and hematuria.  Musculoskeletal: Negative for gait problem.  Skin: Negative for color change, pallor and rash.  Neurological: Negative for dizziness, syncope, light-headedness and headaches.  Hematological: Does not bruise/bleed easily.   Psychiatric/Behavioral: Negative for behavioral problems and confusion.      Allergies  Codeine  Home Medications   Prior to Admission medications   Medication Sig Start Date End Date Taking? Authorizing Provider  acetaminophen (TYLENOL) 325 MG tablet Take 650 mg by mouth every 4 (four) hours as needed for pain.     Historical Provider, MD  albuterol (PROVENTIL HFA;VENTOLIN HFA) 108 (90 BASE) MCG/ACT inhaler Inhale 1-2 puffs into the lungs every 6 (six) hours as needed for wheezing. 12/31/12   Dorthula Matas, PA-C  amLODipine (NORVASC) 5 MG tablet Take 5 mg by mouth daily.    Historical Provider, MD  aspirin 81 MG chewable tablet Chew 81 mg by mouth daily.    Historical Provider, MD  atorvastatin (LIPITOR) 40 MG tablet Take 40 mg by mouth at bedtime.     Historical Provider, MD  cholecalciferol (VITAMIN D) 1000 UNITS tablet Take 1,000 Units by mouth daily.    Historical Provider, MD  clopidogrel (PLAVIX) 75 MG tablet Take 75 mg by mouth daily.    Historical Provider, MD  diazepam (VALIUM) 5 MG tablet Take 5 mg by mouth 2 (two) times daily.    Historical Provider, MD  diphenhydrAMINE (BENADRYL) 12.5 MG/5ML elixir Take 12.5 mg by mouth as needed (for allergic reaction to Norco).     Historical Provider, MD  donepezil (ARICEPT) 5 MG tablet Take 5 mg by mouth daily.    Historical Provider, MD  ferrous sulfate 325 (65 FE) MG tablet Take 325 mg by mouth daily with breakfast.    Historical Provider, MD  HYDROcodone-acetaminophen (NORCO/VICODIN) 5-325 MG per tablet Take 1 tablet by mouth every 6 (six) hours as needed for pain. 10/13/12   Lars Mage, PA-C  isosorbide mononitrate (IMDUR) 30 MG 24 hr tablet Take 30 mg by mouth at bedtime.    Historical Provider, MD  lansoprazole (PREVACID) 30 MG capsule Take 30 mg by mouth 2 (two) times daily.    Historical Provider, MD  levothyroxine (SYNTHROID, LEVOTHROID) 50 MCG tablet Take 50 mcg by mouth daily before breakfast.    Historical Provider, MD   losartan (COZAAR) 100 MG tablet Take 100 mg by mouth daily.    Historical Provider, MD  Melatonin 3 MG TABS Take 3 mg by mouth at bedtime.    Historical Provider, MD  metoprolol tartrate (LOPRESSOR) 25 MG tablet Take 25 mg by mouth at bedtime.    Historical Provider, MD  Naproxen Sodium 220 MG CAPS Take 220 mg by mouth daily with breakfast.    Historical Provider, MD  nitroGLYCERIN (NITROSTAT) 0.4 MG SL tablet Place 0.4 mg under the tongue every 5 (five) minutes as needed for chest pain.     Historical Provider, MD  omega-3 acid ethyl esters (LOVAZA) 1 G capsule Take 2 g by mouth daily.    Historical Provider, MD  PARoxetine (PAXIL) 20 MG tablet Take 20 mg by mouth  every morning.    Historical Provider, MD  pregabalin (LYRICA) 25 MG capsule Take 25 mg by mouth 2 (two) times daily.    Historical Provider, MD  ranolazine (RANEXA) 500 MG 12 hr tablet Take 1 tablet (500 mg total) by mouth 2 (two) times daily. 08/12/12   Brittainy M Simmons, PA-C   BP 145/65 mmHg  Pulse 92  Temp(Src) 98 F (36.7 C) (Oral)  Resp 18  Ht 5' (1.524 m)  Wt 134 lb (60.782 kg)  BMI 26.17 kg/m2  SpO2 96% Physical Exam  Constitutional: She is oriented to person, place, and time. She appears well-developed and well-nourished. No distress.  HENT:  Head: Normocephalic.  Eyes: Conjunctivae are normal. Pupils are equal, round, and reactive to light. No scleral icterus.  Neck: Normal range of motion. Neck supple. No thyromegaly present.  Cardiovascular: Normal rate and regular rhythm.  Exam reveals no gallop and no friction rub.   No murmur heard. Pulmonary/Chest: Effort normal and breath sounds normal. No respiratory distress. She has no wheezes. She has no rales.  Abdominal: Soft. Bowel sounds are normal. She exhibits no distension. There is no tenderness. There is no rebound.  Musculoskeletal: Normal range of motion.  Neurological: She is alert and oriented to person, place, and time.  Skin: Skin is warm and dry. No  rash noted.  Psychiatric: She has a normal mood and affect. Her behavior is normal.    ED Course  Procedures (including critical care time) Labs Review Labs Reviewed - No data to display  Imaging Review No results found.   EKG Interpretation None      MDM   Final diagnoses:  Fall, initial encounter    Awake and alert. Normal mental status. Normal exam. No reported strength of the head. Nontender over the scalp and skull. I think she is appropriate for discharge without imaging or further studies.    Rolland PorterMark Harace Mccluney, MD 03/12/14 1656  Rolland PorterMark Monta Police, MD 03/15/14 [id number]

## 2014-04-22 ENCOUNTER — Encounter (HOSPITAL_COMMUNITY): Payer: Self-pay | Admitting: Cardiovascular Disease

## 2014-07-08 ENCOUNTER — Encounter (HOSPITAL_BASED_OUTPATIENT_CLINIC_OR_DEPARTMENT_OTHER): Payer: Self-pay | Admitting: *Deleted

## 2014-07-08 ENCOUNTER — Emergency Department (HOSPITAL_BASED_OUTPATIENT_CLINIC_OR_DEPARTMENT_OTHER): Payer: Medicare Other

## 2014-07-08 ENCOUNTER — Emergency Department (HOSPITAL_BASED_OUTPATIENT_CLINIC_OR_DEPARTMENT_OTHER)
Admission: EM | Admit: 2014-07-08 | Discharge: 2014-07-09 | Disposition: A | Discharge status: Home / Self Care | Payer: Medicare Other | Attending: Emergency Medicine | Admitting: Emergency Medicine

## 2014-07-08 DIAGNOSIS — Z79899 Other long term (current) drug therapy: Secondary | ICD-10-CM | POA: Insufficient documentation

## 2014-07-08 DIAGNOSIS — Z9889 Other specified postprocedural states: Secondary | ICD-10-CM | POA: Diagnosis not present

## 2014-07-08 DIAGNOSIS — W19XXXA Unspecified fall, initial encounter: Secondary | ICD-10-CM

## 2014-07-08 DIAGNOSIS — Z7982 Long term (current) use of aspirin: Secondary | ICD-10-CM | POA: Insufficient documentation

## 2014-07-08 DIAGNOSIS — E119 Type 2 diabetes mellitus without complications: Secondary | ICD-10-CM | POA: Insufficient documentation

## 2014-07-08 DIAGNOSIS — I509 Heart failure, unspecified: Secondary | ICD-10-CM | POA: Diagnosis not present

## 2014-07-08 DIAGNOSIS — S0003XA Contusion of scalp, initial encounter: Secondary | ICD-10-CM | POA: Diagnosis not present

## 2014-07-08 DIAGNOSIS — F0390 Unspecified dementia without behavioral disturbance: Secondary | ICD-10-CM | POA: Insufficient documentation

## 2014-07-08 DIAGNOSIS — S199XXA Unspecified injury of neck, initial encounter: Secondary | ICD-10-CM | POA: Insufficient documentation

## 2014-07-08 DIAGNOSIS — Z951 Presence of aortocoronary bypass graft: Secondary | ICD-10-CM | POA: Insufficient documentation

## 2014-07-08 DIAGNOSIS — K219 Gastro-esophageal reflux disease without esophagitis: Secondary | ICD-10-CM | POA: Insufficient documentation

## 2014-07-08 DIAGNOSIS — Z7902 Long term (current) use of antithrombotics/antiplatelets: Secondary | ICD-10-CM | POA: Diagnosis not present

## 2014-07-08 DIAGNOSIS — T148XXA Other injury of unspecified body region, initial encounter: Secondary | ICD-10-CM

## 2014-07-08 DIAGNOSIS — Y998 Other external cause status: Secondary | ICD-10-CM | POA: Diagnosis not present

## 2014-07-08 DIAGNOSIS — Z791 Long term (current) use of non-steroidal anti-inflammatories (NSAID): Secondary | ICD-10-CM | POA: Insufficient documentation

## 2014-07-08 DIAGNOSIS — S0990XA Unspecified injury of head, initial encounter: Secondary | ICD-10-CM | POA: Diagnosis present

## 2014-07-08 DIAGNOSIS — I10 Essential (primary) hypertension: Secondary | ICD-10-CM | POA: Insufficient documentation

## 2014-07-08 DIAGNOSIS — I252 Old myocardial infarction: Secondary | ICD-10-CM | POA: Diagnosis not present

## 2014-07-08 DIAGNOSIS — Y9289 Other specified places as the place of occurrence of the external cause: Secondary | ICD-10-CM | POA: Insufficient documentation

## 2014-07-08 DIAGNOSIS — F329 Major depressive disorder, single episode, unspecified: Secondary | ICD-10-CM | POA: Diagnosis not present

## 2014-07-08 DIAGNOSIS — I2510 Atherosclerotic heart disease of native coronary artery without angina pectoris: Secondary | ICD-10-CM | POA: Insufficient documentation

## 2014-07-08 DIAGNOSIS — W01198A Fall on same level from slipping, tripping and stumbling with subsequent striking against other object, initial encounter: Secondary | ICD-10-CM | POA: Diagnosis not present

## 2014-07-08 DIAGNOSIS — M1990 Unspecified osteoarthritis, unspecified site: Secondary | ICD-10-CM | POA: Insufficient documentation

## 2014-07-08 DIAGNOSIS — E079 Disorder of thyroid, unspecified: Secondary | ICD-10-CM | POA: Insufficient documentation

## 2014-07-08 DIAGNOSIS — Y9389 Activity, other specified: Secondary | ICD-10-CM | POA: Insufficient documentation

## 2014-07-08 NOTE — ED Notes (Signed)
Pt from [facility] on [address].  Reports falling tonight from standing.  Denies LOC.  Hit head in bathroom, denies neck and back pain.  Hematoma noted to top of head-size of a quarter.  Denies pain.  No blood thinners.

## 2014-07-08 NOTE — ED Provider Notes (Signed)
CSN: [id number]     Arrival date & time 07/08/14  2255 History  This chart was scribed for Derwood Kaplan, MD by Freida Busman, ED Scribe. This patient was seen in room MH06/MH06 and the patient's care was started 11:08 PM.    Chief Complaint  Patient presents with  . Fall     The history is provided by the patient. No language interpreter was used.    HPI Comments:  [name] Short is a 81 y.o. female who presents to the Emergency Department from NH s/p fall this evening complaining of moderate constant HA following the incident. She reports associated neck pain. Pt states her legs gave way and she fell backwards hitting her head on the bathroom door. She denies LOC. She notes she normally ambulates with a walker but was not using it when she fell. She has not ambulated since the fall. She denies vision changes, numbness and tingling to her BUE. She is not currently in blood thinners. No alleviating factors noted.    Past Medical History  Diagnosis Date  . Diabetes mellitus without complication   . Hypertension   . Coronary artery disease   . Thyroid disease   . Depression   . Memory loss   . PVD (peripheral vascular disease)   . CHF (congestive heart failure)   . Atrial fibrillation   . Myocardial infarction   . Dyspnea 06/01/2012    2D Echo - EF 60-65, mitral valve calcified annulus, left atrium severely dilated, right atrium moderately dilated  . Chest pain 10/07/2009    2D Echo - EF 50-55, mitral valve calcified annulus  . CAD (coronary artery disease) 03/01/2008    Lexiscan - EF 79, no ECG changes/EKG negative for ischemia, post-stress EF 86  . Cerebral atherosclerosis 10/15/2011    Extracranial Examination - mild-moderate amount smooth mixed density plaque elevating velocities within the proximal and mid segments of the internal carotid artery  . Claudication 09/01/2010    LEA Doppler - Bilateral ABIs-demonstrated moderate arterial insufficiency to the lower extremities at rest,  Right CIA/EIA-known occlusive disease, PV Cath  . Anginal pain     not in in last month  . Dementia   . GERD (gastroesophageal reflux disease)     takes prevacid  . Arthritis    Past Surgical History  Procedure Laterality Date  . Abdominal hysterectomy    . Aorto- femoral bipass  2011  . Joint replacement Left 2010    knee  . Cardiac catheterization  2012  . Cardiac catheterization  06/02/2012    Unchanged anatomy compared to 3 years ago, continue medical therapy  . Cardiac catheterization  10/06/2009    Occluded right coronary artery, left-to-right collateral noncritical left disease with occluded grafts  . Angioplasty Left 02/23/2010    Darcon patch, 80-90% stenosis proximal to the anastomosis  . Coronary artery bypass graft  1993  . Coronary angioplasty    . Eye surgery    . Axillary-femoral bypass graft Right 10/08/2012    Procedure: BYPASS GRAFT AXILLA-BIFEMORAL;  Surgeon: Pryor Ochoa, MD;  Location: [facility] OR;  Service: Vascular;  Laterality: Right;  . Left heart catheterization with coronary/graft angiogram N/A 06/02/2012    Procedure: LEFT HEART CATHETERIZATION WITH Isabel Caprice;  Surgeon: Runell Gess, MD;  Location: [facility] CATH LAB;  Service: Cardiovascular;  Laterality: N/A;   Family History  Problem Relation Age of Onset  . Heart disease Father   . Hypertension Father   . Heart attack Father   .  Kidney failure Brother   . Hyperlipidemia Daughter    History  Substance Use Topics  . Smoking status: Former Smoker    Types: Cigarettes    Quit date: 05/15/1991  . Smokeless tobacco: Never Used  . Alcohol Use: No   OB History    No data available     Review of Systems  Musculoskeletal: Positive for neck pain.  Neurological: Positive for headaches.  All other systems reviewed and are negative.     Allergies  Codeine  Home Medications   Prior to Admission medications   Medication Sig Start Date End Date Taking? Authorizing Provider   acetaminophen (TYLENOL) 325 MG tablet Take 650 mg by mouth every 4 (four) hours as needed for pain.     Historical Provider, MD  albuterol (PROVENTIL HFA;VENTOLIN HFA) 108 (90 BASE) MCG/ACT inhaler Inhale 1-2 puffs into the lungs every 6 (six) hours as needed for wheezing. 12/31/12   Dorthula Matas, PA-C  amLODipine (NORVASC) 5 MG tablet Take 5 mg by mouth daily.    Historical Provider, MD  aspirin 81 MG chewable tablet Chew 81 mg by mouth daily.    Historical Provider, MD  atorvastatin (LIPITOR) 40 MG tablet Take 40 mg by mouth at bedtime.     Historical Provider, MD  cholecalciferol (VITAMIN D) 1000 UNITS tablet Take 1,000 Units by mouth daily.    Historical Provider, MD  clopidogrel (PLAVIX) 75 MG tablet Take 75 mg by mouth daily.    Historical Provider, MD  diazepam (VALIUM) 5 MG tablet Take 5 mg by mouth 2 (two) times daily.    Historical Provider, MD  diphenhydrAMINE (BENADRYL) 12.5 MG/5ML elixir Take 12.5 mg by mouth as needed (for allergic reaction to Norco).     Historical Provider, MD  donepezil (ARICEPT) 5 MG tablet Take 5 mg by mouth daily.    Historical Provider, MD  ferrous sulfate 325 (65 FE) MG tablet Take 325 mg by mouth daily with breakfast.    Historical Provider, MD  HYDROcodone-acetaminophen (NORCO/VICODIN) 5-325 MG per tablet Take 1 tablet by mouth every 6 (six) hours as needed for pain. 10/13/12   Lars Mage, PA-C  isosorbide mononitrate (IMDUR) 30 MG 24 hr tablet Take 30 mg by mouth at bedtime.    Historical Provider, MD  lansoprazole (PREVACID) 30 MG capsule Take 30 mg by mouth 2 (two) times daily.    Historical Provider, MD  levothyroxine (SYNTHROID, LEVOTHROID) 50 MCG tablet Take 50 mcg by mouth daily before breakfast.    Historical Provider, MD  losartan (COZAAR) 100 MG tablet Take 100 mg by mouth daily.    Historical Provider, MD  Melatonin 3 MG TABS Take 3 mg by mouth at bedtime.    Historical Provider, MD  metoprolol tartrate (LOPRESSOR) 25 MG tablet Take 25 mg by  mouth at bedtime.    Historical Provider, MD  Naproxen Sodium 220 MG CAPS Take 220 mg by mouth daily with breakfast.    Historical Provider, MD  nitroGLYCERIN (NITROSTAT) 0.4 MG SL tablet Place 0.4 mg under the tongue every 5 (five) minutes as needed for chest pain.     Historical Provider, MD  omega-3 acid ethyl esters (LOVAZA) 1 G capsule Take 2 g by mouth daily.    Historical Provider, MD  PARoxetine (PAXIL) 20 MG tablet Take 20 mg by mouth every morning.    Historical Provider, MD  pregabalin (LYRICA) 25 MG capsule Take 25 mg by mouth 2 (two) times daily.    Historical  Provider, MD  ranolazine (RANEXA) 500 MG 12 hr tablet Take 1 tablet (500 mg total) by mouth 2 (two) times daily. 08/12/12   Brittainy M Simmons, PA-C   BP 121/61 mmHg  Pulse 59  Temp(Src) 98.2 F (36.8 C) (Oral)  Resp 18  SpO2 89% Physical Exam  Constitutional: She is oriented to person, place, and time. She appears well-developed and well-nourished.  Eyes: Conjunctivae and EOM are normal.  Pupils are 2 mm and equal  Neck:  Placed in C-collar  Cardiovascular: Normal rate, regular rhythm and normal heart sounds.   Pulmonary/Chest: Effort normal and breath sounds normal. No respiratory distress.  Abdominal: Soft. She exhibits no distension.  Musculoskeletal:  Positive midline C-spine TTP  Head to toe evaluation shows no hematoma, bleeding of the scalp, no facial abrasions, step offs, crepitus, no tenderness to palpation of the bilateral upper and lower extremities, no gross deformities, no chest tenderness, no pelvic pain.   Neurological: She is alert and oriented to person, place, and time.   Sensory exam for BUE equal and normal  Skin: Skin is warm and dry.  Hematoma and ecchymosis to posterior scalp  Psychiatric: She has a normal mood and affect. Her behavior is normal.  Nursing note and vitals reviewed.   ED Course  Procedures   DIAGNOSTIC STUDIES:  Oxygen Saturation is 95% on RA, adequate by my  interpretation.    COORDINATION OF CARE:  11:09 PM C-Collar placed in ED. Discussed treatment plan with pt at bedside and pt agreed to plan.  Labs Review Labs Reviewed - No data to display  Imaging Review Ct Head Wo Contrast  07/09/2014   CLINICAL DATA:  Trauma.  Fell in bathroom.  Headache, neck pain.  EXAM: CT HEAD WITHOUT CONTRAST  CT CERVICAL SPINE WITHOUT CONTRAST  TECHNIQUE: Multidetector CT imaging of the head and cervical spine was performed following the standard protocol without intravenous contrast. Multiplanar CT image reconstructions of the cervical spine were also generated.  COMPARISON:  CT head 02/25/2014. CT head and cervical spine 03/04/2012.  FINDINGS: CT HEAD FINDINGS  Diffuse cerebral atrophy. Ventricular dilatation consistent with central atrophy. Low-attenuation changes in the deep white matter consistent with small vessel ischemia. No mass effect or midline shift. No abnormal extra-axial fluid collections. Gray-white matter junctions are distinct. Basal cisterns are not effaced. No evidence of acute intracranial hemorrhage. No depressed skull fractures. Visualized paranasal sinuses and mastoid air cells are not opacified.  CT CERVICAL SPINE FINDINGS  Normal alignment of the cervical spine and facet joints. Diffuse bone demineralization. Degenerative changes throughout the cervical spine with narrowed cervical interspaces and associated endplate hypertrophic changes. Degenerative changes at C1-2 level. No vertebral compression deformities. No prevertebral soft tissue swelling. No focal bone lesion or bone destruction. Bone cortex and trabecular architecture appear intact. Vascular calcifications.  IMPRESSION: No acute intracranial abnormalities. Diffuse cerebral atrophy and small vessel ischemic changes.  Normal alignment of the cervical spine. Diffuse degenerative changes. No displaced acute fractures identified.   Electronically Signed   By: Burman Nieves M.D.   On: 07/09/2014  00:37   Ct Cervical Spine Wo Contrast  07/09/2014   CLINICAL DATA:  Trauma.  Fell in bathroom.  Headache, neck pain.  EXAM: CT HEAD WITHOUT CONTRAST  CT CERVICAL SPINE WITHOUT CONTRAST  TECHNIQUE: Multidetector CT imaging of the head and cervical spine was performed following the standard protocol without intravenous contrast. Multiplanar CT image reconstructions of the cervical spine were also generated.  COMPARISON:  CT head 02/25/2014.  CT head and cervical spine 03/04/2012.  FINDINGS: CT HEAD FINDINGS  Diffuse cerebral atrophy. Ventricular dilatation consistent with central atrophy. Low-attenuation changes in the deep white matter consistent with small vessel ischemia. No mass effect or midline shift. No abnormal extra-axial fluid collections. Gray-white matter junctions are distinct. Basal cisterns are not effaced. No evidence of acute intracranial hemorrhage. No depressed skull fractures. Visualized paranasal sinuses and mastoid air cells are not opacified.  CT CERVICAL SPINE FINDINGS  Normal alignment of the cervical spine and facet joints. Diffuse bone demineralization. Degenerative changes throughout the cervical spine with narrowed cervical interspaces and associated endplate hypertrophic changes. Degenerative changes at C1-2 level. No vertebral compression deformities. No prevertebral soft tissue swelling. No focal bone lesion or bone destruction. Bone cortex and trabecular architecture appear intact. Vascular calcifications.  IMPRESSION: No acute intracranial abnormalities. Diffuse cerebral atrophy and small vessel ischemic changes.  Normal alignment of the cervical spine. Diffuse degenerative changes. No displaced acute fractures identified.   Electronically Signed   By: Burman NievesWilliam  Stevens M.D.   On: 07/09/2014 00:37     EKG Interpretation None      @300  = ambulated without any difficulty. MDM   Final diagnoses:  Fall  Contusion    I personally performed the services described in this  documentation, which was scribed in my presence. The recorded information has been reviewed and is accurate.  DDx includes: - Mechanical falls - ICH - Fractures - Contusions - Soft tissue injury  Pt with fall. Hit her head, and has a hematoma. CT head ordered and neg. Also had midline cspine tenderness - CT cspine is negative.  Repeat evaluation shows: No midline c-spine tenderness, pt able to turn head to 45 degrees bilaterally without any pain and able to flex neck to the chest and extend without any pain or neurologic symptoms.  Stable for d/c.     Derwood KaplanAnkit Akari Crysler, MD 07/09/14 [phone]

## 2014-07-08 NOTE — ED Notes (Signed)
Dr. Rhunette CroftNanavati in to see pt prior to RN assessment, see MD notes, pending orders, [facility]. Pt c/o HA and neck pain. c-collar applied at this time. Pt alert, NAD, calm, interactive, resps e/u, speaking in clear complete sentences, no dyspnea noted, appropriate, following commands, answering questions. States, "I've been really tired lately, I think my leg just gave way, I have been using my walker".

## 2014-07-09 NOTE — ED Notes (Signed)
Pt sleeping, resting, NAD, calm, arousable to voice, no changes, no dyspnea.

## 2014-07-09 NOTE — ED Notes (Signed)
Alert, NAD, calm, interactive, resps e/u, no dyspnea noted, imaging results noted, pending EDP re-eval, c-collar in place, pt loosened c-collar, VSS, pt updated.

## 2014-07-09 NOTE — ED Notes (Signed)
Pt up to b/r with assist, ambulating with walker, NAD, calm, interactive. Pending [facility] arrival for transport.

## 2014-07-09 NOTE — ED Notes (Signed)
Pt ambulated well with walker and supervision, minimal assist.

## 2014-07-09 NOTE — ED Notes (Signed)
Pt onto BP (void and BM), c-collar remains in place, VSS. No changes. Alert, NAD, calm, interactive.

## 2014-07-09 NOTE — ED Notes (Signed)
[facility].

## 2014-07-09 NOTE — Discharge Instructions (Signed)
We saw you in the ER after you had a fall. °All the imaging results are normal, no fractures seen. No evidence of brain bleed. °Please be very careful with walking, and do everything possible to prevent falls. ° ° °Contusion °A contusion is a deep bruise. Contusions are the result of an injury that caused bleeding under the skin. The contusion may turn blue, purple, or yellow. Minor injuries will give you a painless contusion, but more severe contusions may stay painful and swollen for a few weeks.  °CAUSES  °A contusion is usually caused by a blow, trauma, or direct force to an area of the body. °SYMPTOMS  °· Swelling and redness of the injured area. °· Bruising of the injured area. °· Tenderness and soreness of the injured area. °· Pain. °DIAGNOSIS  °The diagnosis can be made by taking a history and physical exam. An X-ray, CT scan, or MRI may be needed to determine if there were any associated injuries, such as fractures. °TREATMENT  °Specific treatment will depend on what area of the body was injured. In general, the best treatment for a contusion is resting, icing, elevating, and applying cold compresses to the injured area. Over-the-counter medicines may also be recommended for pain control. Ask your caregiver what the best treatment is for your contusion. °HOME CARE INSTRUCTIONS  °· Put ice on the injured area. °· Put ice in a plastic bag. °· Place a towel between your skin and the bag. °· Leave the ice on for 15-20 minutes, 3-4 times a day, or as directed by your health care provider. °· Only take over-the-counter or prescription medicines for pain, discomfort, or fever as directed by your caregiver. Your caregiver may recommend avoiding anti-inflammatory medicines (aspirin, ibuprofen, and naproxen) for 48 hours because these medicines may increase bruising. °· Rest the injured area. °· If possible, elevate the injured area to reduce swelling. °SEEK IMMEDIATE MEDICAL CARE IF:  °· You have increased bruising  or swelling. °· You have pain that is getting worse. °· Your swelling or pain is not relieved with medicines. °MAKE SURE YOU:  °· Understand these instructions. °· Will watch your condition. °· Will get help right away if you are not doing well or get worse. °Document Released: 02/07/2005 Document Revised: 05/05/2013 Document Reviewed: 03/05/2011 °ExitCare® Patient Information ©2015 [employer]. This information is not intended to replace advice given to you by your health care provider. Make sure you discuss any questions you have with your health care provider. ° °Fall Prevention and Home Safety °Falls cause injuries and can affect all age groups. It is possible to use preventive measures to significantly decrease the likelihood of falls. There are many simple measures which can make your home safer and prevent falls. °OUTDOORS °· Repair cracks and edges of walkways and driveways. °· Remove high doorway thresholds. °· Trim shrubbery on the main path into your home. °· Have good outside lighting. °· Clear walkways of tools, rocks, debris, and clutter. °· Check that handrails are not broken and are securely fastened. Both sides of steps should have handrails. °· Have leaves, snow, and ice cleared regularly. °· Use sand or salt on walkways during winter months. °· In the garage, clean up grease or oil spills. °BATHROOM °· Install night lights. °· Install grab bars by the toilet and in the tub and shower. °· Use non-skid mats or decals in the tub or shower. °· Place a plastic non-slip stool in the shower to sit on, if needed. °·   Keep floors dry and clean up all water on the floor immediately. °· Remove soap buildup in the tub or shower on a regular basis. °· Secure bath mats with non-slip, double-sided rug tape. °· Remove throw rugs and tripping hazards from the floors. °BEDROOMS °· Install night lights. °· Make sure a bedside light is easy to reach. °· Do not use oversized bedding. °· Keep a telephone by your  bedside. °· Have a firm chair with side arms to use for getting dressed. °· Remove throw rugs and tripping hazards from the floor. °KITCHEN °· Keep handles on pots and pans turned toward the center of the stove. Use back burners when possible. °· Clean up spills quickly and allow time for drying. °· Avoid walking on wet floors. °· Avoid hot utensils and knives. °· Position shelves so they are not too high or low. °· Place commonly used objects within easy reach. °· If necessary, use a sturdy step stool with a grab bar when reaching. °· Keep electrical cables out of the way. °· Do not use floor polish or wax that makes floors slippery. If you must use wax, use non-skid floor wax. °· Remove throw rugs and tripping hazards from the floor. °STAIRWAYS °· Never leave objects on stairs. °· Place handrails on both sides of stairways and use them. Fix any loose handrails. Make sure handrails on both sides of the stairways are as Planck as the stairs. °· Check carpeting to make sure it is firmly attached along stairs. Make repairs to worn or loose carpet promptly. °· Avoid placing throw rugs at the top or bottom of stairways, or properly secure the rug with carpet tape to prevent slippage. Get rid of throw rugs, if possible. °· Have an electrician put in a light switch at the top and bottom of the stairs. °OTHER FALL PREVENTION TIPS °· Wear low-heel or rubber-soled shoes that are supportive and fit well. Wear closed toe shoes. °· When using a stepladder, make sure it is fully opened and both spreaders are firmly locked. Do not climb a closed stepladder. °· Add color or contrast paint or tape to grab bars and handrails in your home. Place contrasting color strips on first and last steps. °· Learn and use mobility aids as needed. Install an electrical emergency response system. °· Turn on lights to avoid dark areas. Replace light bulbs that burn out immediately. Get light switches that glow. °· Arrange furniture to create clear  pathways. Keep furniture in the same place. °· Firmly attach carpet with non-skid or double-sided tape. °· Eliminate uneven floor surfaces. °· Select a carpet pattern that does not visually hide the edge of steps. °· Be aware of all pets. °OTHER HOME SAFETY TIPS °· Set the water temperature for 120° F (48.8° C). °· Keep emergency numbers on or near the telephone. °· Keep smoke detectors on every level of the home and near sleeping areas. °Document Released: 04/20/2002 Document Revised: 10/30/2011 Document Reviewed: 07/20/2011 °ExitCare® Patient Information ©2015 [employer]. This information is not intended to replace advice given to you by your health care provider. Make sure you discuss any questions you have with your health care provider. ° °

## 2014-08-16 ENCOUNTER — Emergency Department (HOSPITAL_BASED_OUTPATIENT_CLINIC_OR_DEPARTMENT_OTHER): Payer: Medicare Other

## 2014-08-16 ENCOUNTER — Emergency Department (HOSPITAL_BASED_OUTPATIENT_CLINIC_OR_DEPARTMENT_OTHER)
Admission: EM | Admit: 2014-08-16 | Discharge: 2014-08-16 | Disposition: A | Discharge status: Home / Self Care | Payer: Medicare Other | Attending: Emergency Medicine | Admitting: Emergency Medicine

## 2014-08-16 ENCOUNTER — Encounter (HOSPITAL_BASED_OUTPATIENT_CLINIC_OR_DEPARTMENT_OTHER): Payer: Self-pay

## 2014-08-16 DIAGNOSIS — Y9289 Other specified places as the place of occurrence of the external cause: Secondary | ICD-10-CM | POA: Diagnosis not present

## 2014-08-16 DIAGNOSIS — Y998 Other external cause status: Secondary | ICD-10-CM | POA: Insufficient documentation

## 2014-08-16 DIAGNOSIS — Y9389 Activity, other specified: Secondary | ICD-10-CM | POA: Diagnosis not present

## 2014-08-16 DIAGNOSIS — Z87891 Personal history of nicotine dependence: Secondary | ICD-10-CM | POA: Diagnosis not present

## 2014-08-16 DIAGNOSIS — I10 Essential (primary) hypertension: Secondary | ICD-10-CM | POA: Insufficient documentation

## 2014-08-16 DIAGNOSIS — Z7982 Long term (current) use of aspirin: Secondary | ICD-10-CM | POA: Diagnosis not present

## 2014-08-16 DIAGNOSIS — S161XXA Strain of muscle, fascia and tendon at neck level, initial encounter: Secondary | ICD-10-CM

## 2014-08-16 DIAGNOSIS — I4891 Unspecified atrial fibrillation: Secondary | ICD-10-CM | POA: Diagnosis not present

## 2014-08-16 DIAGNOSIS — Z9889 Other specified postprocedural states: Secondary | ICD-10-CM | POA: Diagnosis not present

## 2014-08-16 DIAGNOSIS — Z79899 Other long term (current) drug therapy: Secondary | ICD-10-CM | POA: Diagnosis not present

## 2014-08-16 DIAGNOSIS — S0990XA Unspecified injury of head, initial encounter: Secondary | ICD-10-CM | POA: Diagnosis present

## 2014-08-16 DIAGNOSIS — F329 Major depressive disorder, single episode, unspecified: Secondary | ICD-10-CM | POA: Diagnosis not present

## 2014-08-16 DIAGNOSIS — M1990 Unspecified osteoarthritis, unspecified site: Secondary | ICD-10-CM | POA: Diagnosis not present

## 2014-08-16 DIAGNOSIS — I252 Old myocardial infarction: Secondary | ICD-10-CM | POA: Diagnosis not present

## 2014-08-16 DIAGNOSIS — W01198A Fall on same level from slipping, tripping and stumbling with subsequent striking against other object, initial encounter: Secondary | ICD-10-CM | POA: Diagnosis not present

## 2014-08-16 DIAGNOSIS — E079 Disorder of thyroid, unspecified: Secondary | ICD-10-CM | POA: Insufficient documentation

## 2014-08-16 DIAGNOSIS — F0390 Unspecified dementia without behavioral disturbance: Secondary | ICD-10-CM | POA: Diagnosis not present

## 2014-08-16 DIAGNOSIS — Z7902 Long term (current) use of antithrombotics/antiplatelets: Secondary | ICD-10-CM | POA: Insufficient documentation

## 2014-08-16 DIAGNOSIS — Z950 Presence of cardiac pacemaker: Secondary | ICD-10-CM | POA: Insufficient documentation

## 2014-08-16 DIAGNOSIS — I25119 Atherosclerotic heart disease of native coronary artery with unspecified angina pectoris: Secondary | ICD-10-CM | POA: Insufficient documentation

## 2014-08-16 DIAGNOSIS — Z791 Long term (current) use of non-steroidal anti-inflammatories (NSAID): Secondary | ICD-10-CM | POA: Diagnosis not present

## 2014-08-16 DIAGNOSIS — I509 Heart failure, unspecified: Secondary | ICD-10-CM | POA: Diagnosis not present

## 2014-08-16 DIAGNOSIS — W19XXXA Unspecified fall, initial encounter: Secondary | ICD-10-CM

## 2014-08-16 DIAGNOSIS — K219 Gastro-esophageal reflux disease without esophagitis: Secondary | ICD-10-CM | POA: Insufficient documentation

## 2014-08-16 DIAGNOSIS — Z9861 Coronary angioplasty status: Secondary | ICD-10-CM | POA: Insufficient documentation

## 2014-08-16 DIAGNOSIS — E119 Type 2 diabetes mellitus without complications: Secondary | ICD-10-CM | POA: Insufficient documentation

## 2014-08-16 MED ORDER — ACETAMINOPHEN 325 MG PO TABS
ORAL_TABLET | ORAL | Status: AC
  Filled 2014-08-16: qty 2

## 2014-08-16 MED ORDER — ACETAMINOPHEN 325 MG PO TABS
650.0000 mg | ORAL_TABLET | Freq: Once | ORAL | Status: AC
  Administered 2014-08-16: 650 mg via ORAL

## 2014-08-16 NOTE — ED Provider Notes (Signed)
CSN: [id number]     Arrival date & time 08/16/14  1916 History  This chart was scribed for Rolan BuccoMelanie Shalik Sanfilippo, MD by Annye AsaAnna Dorsett, ED Scribe. This patient was seen in room MH05/MH05 and the patient's care was started at 8:14 PM.    Chief Complaint  Patient presents with  . Fall   Patient is a 81 y.o. female presenting with fall. The history is provided by the patient and a relative. No language interpreter was used.  Fall Pertinent negatives include no chest pain, no abdominal pain, no headaches and no shortness of breath.     HPI Comments: Lavinia SharpsMary Labombard is a 81 y.o. female with past medical history of DM, HTN, CAD, thyroid disease, depression, memory loss, peripheral vascular disease, CHF, a-fib, MI, dyspnea, chest pain, cerebral atherosclerosis, claudication, GERD, arthritis, dementia who presents to the Emergency Department complaining of fall. Patient explains that she was walking without the assistance of her walker; "my legs just got weak and went out from under me." She reports that she hit her head on the failing along the side wall. She denies LOC. She currently reports a "knot" to the left side of her head and a small puncture wound to the right lateral calf; bleeding controlled at present. She notes mild neck pain. She denies back pain, arm pain, leg or hip pain. She denies new or unusual weakness, states "I'm always a little wobbly when walking around."  Patient is on Plavix under the management of her cardiologist. Daughter states that patient is a [facility]; she has had 4-5 falls recently.   Past Medical History  Diagnosis Date  . Diabetes mellitus without complication   . Hypertension   . Coronary artery disease   . Thyroid disease   . Depression   . Memory loss   . PVD (peripheral vascular disease)   . CHF (congestive heart failure)   . Atrial fibrillation   . Myocardial infarction   . Dyspnea 06/01/2012    2D Echo - EF 60-65, mitral valve calcified  annulus, left atrium severely dilated, right atrium moderately dilated  . Chest pain 10/07/2009    2D Echo - EF 50-55, mitral valve calcified annulus  . CAD (coronary artery disease) 03/01/2008    Lexiscan - EF 79, no ECG changes/EKG negative for ischemia, post-stress EF 86  . Cerebral atherosclerosis 10/15/2011    Extracranial Examination - mild-moderate amount smooth mixed density plaque elevating velocities within the proximal and mid segments of the internal carotid artery  . Claudication 09/01/2010    LEA Doppler - Bilateral ABIs-demonstrated moderate arterial insufficiency to the lower extremities at rest, Right CIA/EIA-known occlusive disease, PV Cath  . Anginal pain     not in in last month  . Dementia   . GERD (gastroesophageal reflux disease)     takes prevacid  . Arthritis    Past Surgical History  Procedure Laterality Date  . Abdominal hysterectomy    . Aorto- femoral bipass  2011  . Joint replacement Left 2010    knee  . Cardiac catheterization  2012  . Cardiac catheterization  06/02/2012    Unchanged anatomy compared to 3 years ago, continue medical therapy  . Cardiac catheterization  10/06/2009    Occluded right coronary artery, left-to-right collateral noncritical left disease with occluded grafts  . Angioplasty Left 02/23/2010    Darcon patch, 80-90% stenosis proximal to the anastomosis  . Coronary artery bypass graft  1993  . Coronary angioplasty    .  Eye surgery    . Axillary-femoral bypass graft Right 10/08/2012    Procedure: BYPASS GRAFT AXILLA-BIFEMORAL;  Surgeon: Pryor Ochoa, MD;  Location: [facility] OR;  Service: Vascular;  Laterality: Right;  . Left heart catheterization with coronary/graft angiogram N/A 06/02/2012    Procedure: LEFT HEART CATHETERIZATION WITH Isabel Caprice;  Surgeon: Runell Gess, MD;  Location: [facility] CATH LAB;  Service: Cardiovascular;  Laterality: N/A;   Family History  Problem Relation Age of Onset  . Heart disease Father   .  Hypertension Father   . Heart attack Father   . Kidney failure Brother   . Hyperlipidemia Daughter    History  Substance Use Topics  . Smoking status: Former Smoker    Types: Cigarettes    Quit date: 05/15/1991  . Smokeless tobacco: Never Used  . Alcohol Use: No   OB History    No data available     Review of Systems  Constitutional: Negative for fever, chills, diaphoresis and fatigue.  HENT: Negative for congestion, rhinorrhea and sneezing.   Eyes: Negative.   Respiratory: Negative for cough, chest tightness and shortness of breath.   Cardiovascular: Negative for chest pain and leg swelling.  Gastrointestinal: Negative for nausea, vomiting, abdominal pain, diarrhea and blood in stool.  Genitourinary: Negative for frequency, hematuria, flank pain and difficulty urinating.  Musculoskeletal: Positive for neck pain. Negative for back pain and arthralgias.  Skin: Positive for wound. Negative for rash.  Neurological: Negative for dizziness, speech difficulty, weakness, numbness and headaches.  All other systems reviewed and are negative.   Allergies  Codeine  Home Medications   Prior to Admission medications   Medication Sig Start Date End Date Taking? Authorizing Provider  acetaminophen (TYLENOL) 325 MG tablet Take 650 mg by mouth every 4 (four) hours as needed for pain.     Historical Provider, MD  albuterol (PROVENTIL HFA;VENTOLIN HFA) 108 (90 BASE) MCG/ACT inhaler Inhale 1-2 puffs into the lungs every 6 (six) hours as needed for wheezing. 12/31/12   Marlon Pel, PA-C  amLODipine (NORVASC) 5 MG tablet Take 5 mg by mouth daily.    Historical Provider, MD  aspirin 81 MG chewable tablet Chew 81 mg by mouth daily.    Historical Provider, MD  atorvastatin (LIPITOR) 40 MG tablet Take 40 mg by mouth at bedtime.     Historical Provider, MD  cholecalciferol (VITAMIN D) 1000 UNITS tablet Take 1,000 Units by mouth daily.    Historical Provider, MD  clopidogrel (PLAVIX) 75 MG tablet  Take 75 mg by mouth daily.    Historical Provider, MD  diazepam (VALIUM) 5 MG tablet Take 5 mg by mouth 2 (two) times daily.    Historical Provider, MD  diphenhydrAMINE (BENADRYL) 12.5 MG/5ML elixir Take 12.5 mg by mouth as needed (for allergic reaction to Norco).     Historical Provider, MD  donepezil (ARICEPT) 5 MG tablet Take 5 mg by mouth daily.    Historical Provider, MD  ferrous sulfate 325 (65 FE) MG tablet Take 325 mg by mouth daily with breakfast.    Historical Provider, MD  HYDROcodone-acetaminophen (NORCO/VICODIN) 5-325 MG per tablet Take 1 tablet by mouth every 6 (six) hours as needed for pain. 10/13/12   Lars Mage, PA-C  isosorbide mononitrate (IMDUR) 30 MG 24 hr tablet Take 30 mg by mouth at bedtime.    Historical Provider, MD  lansoprazole (PREVACID) 30 MG capsule Take 30 mg by mouth 2 (two) times daily.    Historical Provider, MD  levothyroxine (SYNTHROID, LEVOTHROID) 50 MCG tablet Take 50 mcg by mouth daily before breakfast.    Historical Provider, MD  losartan (COZAAR) 100 MG tablet Take 100 mg by mouth daily.    Historical Provider, MD  Melatonin 3 MG TABS Take 3 mg by mouth at bedtime.    Historical Provider, MD  metoprolol tartrate (LOPRESSOR) 25 MG tablet Take 25 mg by mouth at bedtime.    Historical Provider, MD  Naproxen Sodium 220 MG CAPS Take 220 mg by mouth daily with breakfast.    Historical Provider, MD  nitroGLYCERIN (NITROSTAT) 0.4 MG SL tablet Place 0.4 mg under the tongue every 5 (five) minutes as needed for chest pain.     Historical Provider, MD  omega-3 acid ethyl esters (LOVAZA) 1 G capsule Take 2 g by mouth daily.    Historical Provider, MD  PARoxetine (PAXIL) 20 MG tablet Take 20 mg by mouth every morning.    Historical Provider, MD  pregabalin (LYRICA) 25 MG capsule Take 25 mg by mouth 2 (two) times daily.    Historical Provider, MD  ranolazine (RANEXA) 500 MG 12 hr tablet Take 1 tablet (500 mg total) by mouth 2 (two) times daily. 08/12/12   Brittainy M  Simmons, PA-C   BP 125/63 mmHg  Pulse 76  Temp(Src) 99.3 F (37.4 C) (Oral)  Resp 18  Ht 5' (1.524 m)  Wt 140 lb (63.504 kg)  BMI 27.34 kg/m2  SpO2 93% Physical Exam  Constitutional: She is oriented to person, place, and time. She appears well-developed and well-nourished.  HENT:  Head: Normocephalic.  Eyes: Pupils are equal, round, and reactive to light.  Neck: Normal range of motion. Neck supple.  Cardiovascular: Normal rate, regular rhythm and normal heart sounds.   Pulmonary/Chest: Effort normal and breath sounds normal. No respiratory distress. She has no wheezes. She has no rales. She exhibits no tenderness.  Abdominal: Soft. Bowel sounds are normal. There is no tenderness. There is no rebound and no guarding.  Musculoskeletal: Normal range of motion. She exhibits tenderness. She exhibits no edema.  Mild tenderness along the mid cervical spine; no pain to thoracic or lumbar sacral spine. No step offs or deformities. No pain on palpation or range of motion of the extremities, including the hips.   Lymphadenopathy:    She has no cervical adenopathy.  Neurological: She is alert and oriented to person, place, and time. She has normal strength. No cranial nerve deficit or sensory deficit.  Skin: Skin is warm and dry. No rash noted.  Small abrasion to the right lower leg.   Psychiatric: She has a normal mood and affect.    ED Course  Procedures   DIAGNOSTIC STUDIES: Oxygen Saturation is 93% on RA, low by my interpretation.    COORDINATION OF CARE: 8:26 PM Discussed treatment plan with pt at bedside and pt agreed to plan.  Results for orders placed or performed during the hospital encounter of 02/25/14  CBC with Differential  Result Value Ref Range   WBC 7.2 4.0 - 10.5 K/uL   RBC 4.14 3.87 - 5.11 MIL/uL   Hemoglobin 12.6 12.0 - 15.0 g/dL   HCT 09.8 11.9 - 14.7 %   MCV 91.3 78.0 - 100.0 fL   MCH 30.4 26.0 - 34.0 pg   MCHC 33.3 30.0 - 36.0 g/dL   RDW 82.9 56.2 - 13.0 %    Platelets 147 (L) 150 - 400 K/uL   Neutrophils Relative % 66 43 - 77 %  Neutro Abs 4.7 1.7 - 7.7 K/uL   Lymphocytes Relative 22 12 - 46 %   Lymphs Abs 1.6 0.7 - 4.0 K/uL   Monocytes Relative 9 3 - 12 %   Monocytes Absolute 0.6 0.1 - 1.0 K/uL   Eosinophils Relative 3 0 - 5 %   Eosinophils Absolute 0.2 0.0 - 0.7 K/uL   Basophils Relative 0 0 - 1 %   Basophils Absolute 0.0 0.0 - 0.1 K/uL  Basic metabolic panel  Result Value Ref Range   Sodium 140 137 - 147 mEq/L   Potassium 4.0 3.7 - 5.3 mEq/L   Chloride 102 96 - 112 mEq/L   CO2 26 19 - 32 mEq/L   Glucose, Bld 126 (H) 70 - 99 mg/dL   BUN 13 6 - 23 mg/dL   Creatinine, Ser 1.61 0.50 - 1.10 mg/dL   Calcium 8.8 8.4 - 09.6 mg/dL   GFR calc non Af Amer 66 (L) >90 mL/min   GFR calc Af Amer 76 (L) >90 mL/min   Anion gap 12 5 - 15  Urinalysis, Routine w reflex microscopic  Result Value Ref Range   Color, Urine YELLOW YELLOW   APPearance CLEAR CLEAR   Specific Gravity, Urine 1.014 1.005 - 1.030   pH 6.0 5.0 - 8.0   Glucose, UA NEGATIVE NEGATIVE mg/dL   Hgb urine dipstick NEGATIVE NEGATIVE   Bilirubin Urine NEGATIVE NEGATIVE   Ketones, ur NEGATIVE NEGATIVE mg/dL   Protein, ur NEGATIVE NEGATIVE mg/dL   Urobilinogen, UA 0.2 0.0 - 1.0 mg/dL   Nitrite NEGATIVE NEGATIVE   Leukocytes, UA SMALL (A) NEGATIVE  Urine microscopic-add on  Result Value Ref Range   Squamous Epithelial / LPF FEW (A) RARE   WBC, UA 3-6 <3 WBC/hpf   Bacteria, UA RARE RARE   Ct Head Wo Contrast  08/16/2014   CLINICAL DATA:  Fall.  EXAM: CT HEAD WITHOUT CONTRAST  CT CERVICAL SPINE WITHOUT CONTRAST  TECHNIQUE: Multidetector CT imaging of the head and cervical spine was performed following the standard protocol without intravenous contrast. Multiplanar CT image reconstructions of the cervical spine were also generated.  COMPARISON:  CT scan of July 08, 2014.  FINDINGS: CT HEAD FINDINGS  Bony calvarium appears intact. Mild diffuse cortical atrophy is noted. Mild  chronic ischemic white matter disease is noted. No mass effect or midline shift is noted. Ventricular size is within normal limits. There is no evidence of mass lesion, hemorrhage or acute infarction.  CT CERVICAL SPINE FINDINGS  No fracture or spondylolisthesis is noted. Mild degenerative disc disease is noted at C6-7. Mild degenerative changes seen involving posterior facet joints on the left.  IMPRESSION: Mild diffuse cortical atrophy. Mild chronic ischemic white matter disease. No acute intracranial abnormality seen.  Mild degenerative disc disease is noted at C6-7. No acute abnormality seen in the cervical spine.   Electronically Signed   By: Lupita Raider, M.D.   On: 08/16/2014 21:27   Ct Cervical Spine Wo Contrast  08/16/2014   CLINICAL DATA:  Fall.  EXAM: CT HEAD WITHOUT CONTRAST  CT CERVICAL SPINE WITHOUT CONTRAST  TECHNIQUE: Multidetector CT imaging of the head and cervical spine was performed following the standard protocol without intravenous contrast. Multiplanar CT image reconstructions of the cervical spine were also generated.  COMPARISON:  CT scan of July 08, 2014.  FINDINGS: CT HEAD FINDINGS  Bony calvarium appears intact. Mild diffuse cortical atrophy is noted. Mild chronic ischemic white matter disease is noted. No mass effect  or midline shift is noted. Ventricular size is within normal limits. There is no evidence of mass lesion, hemorrhage or acute infarction.  CT CERVICAL SPINE FINDINGS  No fracture or spondylolisthesis is noted. Mild degenerative disc disease is noted at C6-7. Mild degenerative changes seen involving posterior facet joints on the left.  IMPRESSION: Mild diffuse cortical atrophy. Mild chronic ischemic white matter disease. No acute intracranial abnormality seen.  Mild degenerative disc disease is noted at C6-7. No acute abnormality seen in the cervical spine.   Electronically Signed   By: Lupita Raider, M.D.   On: 08/16/2014 21:27    Labs Review Labs Reviewed -  No data to display  Imaging Review Ct Head Wo Contrast  08/16/2014   CLINICAL DATA:  Fall.  EXAM: CT HEAD WITHOUT CONTRAST  CT CERVICAL SPINE WITHOUT CONTRAST  TECHNIQUE: Multidetector CT imaging of the head and cervical spine was performed following the standard protocol without intravenous contrast. Multiplanar CT image reconstructions of the cervical spine were also generated.  COMPARISON:  CT scan of July 08, 2014.  FINDINGS: CT HEAD FINDINGS  Bony calvarium appears intact. Mild diffuse cortical atrophy is noted. Mild chronic ischemic white matter disease is noted. No mass effect or midline shift is noted. Ventricular size is within normal limits. There is no evidence of mass lesion, hemorrhage or acute infarction.  CT CERVICAL SPINE FINDINGS  No fracture or spondylolisthesis is noted. Mild degenerative disc disease is noted at C6-7. Mild degenerative changes seen involving posterior facet joints on the left.  IMPRESSION: Mild diffuse cortical atrophy. Mild chronic ischemic white matter disease. No acute intracranial abnormality seen.  Mild degenerative disc disease is noted at C6-7. No acute abnormality seen in the cervical spine.   Electronically Signed   By: Lupita Raider, M.D.   On: 08/16/2014 21:27   Ct Cervical Spine Wo Contrast  08/16/2014   CLINICAL DATA:  Fall.  EXAM: CT HEAD WITHOUT CONTRAST  CT CERVICAL SPINE WITHOUT CONTRAST  TECHNIQUE: Multidetector CT imaging of the head and cervical spine was performed following the standard protocol without intravenous contrast. Multiplanar CT image reconstructions of the cervical spine were also generated.  COMPARISON:  CT scan of July 08, 2014.  FINDINGS: CT HEAD FINDINGS  Bony calvarium appears intact. Mild diffuse cortical atrophy is noted. Mild chronic ischemic white matter disease is noted. No mass effect or midline shift is noted. Ventricular size is within normal limits. There is no evidence of mass lesion, hemorrhage or acute infarction.   CT CERVICAL SPINE FINDINGS  No fracture or spondylolisthesis is noted. Mild degenerative disc disease is noted at C6-7. Mild degenerative changes seen involving posterior facet joints on the left.  IMPRESSION: Mild diffuse cortical atrophy. Mild chronic ischemic white matter disease. No acute intracranial abnormality seen.  Mild degenerative disc disease is noted at C6-7. No acute abnormality seen in the cervical spine.   Electronically Signed   By: Lupita Raider, M.D.   On: 08/16/2014 21:27     EKG Interpretation None      MDM   Final diagnoses:  Fall, initial encounter  Head injury, initial encounter  Neck strain, initial encounter   Patient presents after a mechanical fall. She states that she was turning her walker and her legs gave out on her. She states that this happens to her frequently and that she's not any weaker than she normally is. She did not have any dizziness chest pain shortness of breath or any other  preceding symptoms. There is no evidence of intracranial hemorrhage or cervical injury. She has no symptoms that sound consistent with an arrhythmia or acute coronary syndrome. She is at her baseline mental status per family. She was discharged back to the nursing home in good condition.  I personally performed the services described in this documentation, which was scribed in my presence.  The recorded information has been reviewed and considered.      Rolan Bucco, MD 08/16/14 2140

## 2014-08-16 NOTE — ED Notes (Signed)
Per EMS pt walking and fell, was not using her walker, states hit her head on the railing on the side wall; no LOC; is on blood thinners, knot to lt side of head, puncture wound to rt lateral calf with bleeding controlled

## 2014-08-16 NOTE — ED Notes (Signed)
Pt c/o cervical spine pain 4/10 cervical soft collar applied. No neuro deficits noted on examination pt moving all extremities equal and strong and is alert and orient x4 pupils PERRLA and 3 mm bilat.

## 2014-08-16 NOTE — Discharge Instructions (Signed)
Cervical Sprain °A cervical sprain is an injury in the neck in which the strong, fibrous tissues (ligaments) that connect your neck bones stretch or tear. Cervical sprains can range from mild to severe. Severe cervical sprains can cause the neck vertebrae to be unstable. This can lead to damage of the spinal cord and can result in serious nervous system problems. The amount of time it takes for a cervical sprain to get better depends on the cause and extent of the injury. Most cervical sprains heal in 1 to 3 weeks. °CAUSES  °Severe cervical sprains may be caused by:  °· Contact sport injuries (such as from football, rugby, wrestling, hockey, auto racing, gymnastics, diving, martial arts, or boxing).   °· Motor vehicle collisions.   °· Whiplash injuries. This is an injury from a sudden forward and backward whipping movement of the head and neck.  °· Falls.   °Mild cervical sprains may be caused by:  °· Being in an awkward position, such as while cradling a telephone between your ear and shoulder.   °· Sitting in a chair that does not offer proper support.   °· Working at a poorly designed computer station.   °· Looking up or down for Swiss periods of time.   °SYMPTOMS  °· Pain, soreness, stiffness, or a burning sensation in the front, back, or sides of the neck. This discomfort may develop immediately after the injury or slowly, 24 hours or more after the injury.   °· Pain or tenderness directly in the middle of the back of the neck.   °· Shoulder or upper back pain.   °· Limited ability to move the neck.   °· Headache.   °· Dizziness.   °· Weakness, numbness, or tingling in the hands or arms.   °· Muscle spasms.   °· Difficulty swallowing or chewing.   °· Tenderness and swelling of the neck.   °DIAGNOSIS  °Most of the time your health care provider can diagnose a cervical sprain by taking your history and doing a physical exam. Your health care provider will ask about previous neck injuries and any known neck  problems, such as arthritis in the neck. X-rays may be taken to find out if there are any other problems, such as with the bones of the neck. Other tests, such as a CT scan or MRI, may also be needed.  °TREATMENT  °Treatment depends on the severity of the cervical sprain. Mild sprains can be treated with rest, keeping the neck in place (immobilization), and pain medicines. Severe cervical sprains are immediately immobilized. Further treatment is done to help with pain, muscle spasms, and other symptoms and may include: °· Medicines, such as pain relievers, numbing medicines, or muscle relaxants.   °· Physical therapy. This may involve stretching exercises, strengthening exercises, and posture training. Exercises and improved posture can help stabilize the neck, strengthen muscles, and help stop symptoms from returning.   °HOME CARE INSTRUCTIONS  °· Put ice on the injured area.   °¨ Put ice in a plastic bag.   °¨ Place a towel between your skin and the bag.   °¨ Leave the ice on for 15-20 minutes, 3-4 times a day.   °· If your injury was severe, you may have been given a cervical collar to wear. A cervical collar is a two-piece collar designed to keep your neck from moving while it heals. °¨ Do not remove the collar unless instructed by your health care provider. °¨ If you have Lemler hair, keep it outside of the collar. °¨ Ask your health care provider before making any adjustments to your collar. Minor   adjustments may be required over time to improve comfort and reduce pressure on your chin or on the back of your head. °¨ If you are allowed to remove the collar for cleaning or bathing, follow your health care provider's instructions on how to do so safely. °¨ Keep your collar clean by wiping it with mild soap and water and drying it completely. If the collar you have been given includes removable pads, remove them every 1-2 days and hand wash them with soap and water. Allow them to air dry. They should be completely  dry before you wear them in the collar. °¨ If you are allowed to remove the collar for cleaning and bathing, wash and dry the skin of your neck. Check your skin for irritation or sores. If you see any, tell your health care provider. °¨ Do not drive while wearing the collar.   °· Only take over-the-counter or prescription medicines for pain, discomfort, or fever as directed by your health care provider.   °· Keep all follow-up appointments as directed by your health care provider.   °· Keep all physical therapy appointments as directed by your health care provider.   °· Make any needed adjustments to your workstation to promote good posture.   °· Avoid positions and activities that make your symptoms worse.   °· Warm up and stretch before being active to help prevent problems.   °SEEK MEDICAL CARE IF:  °· Your pain is not controlled with medicine.   °· You are unable to decrease your pain medicine over time as planned.   °· Your activity level is not improving as expected.   °SEEK IMMEDIATE MEDICAL CARE IF:  °· You develop any bleeding. °· You develop stomach upset. °· You have signs of an allergic reaction to your medicine.   °· Your symptoms get worse.   °· You develop new, unexplained symptoms.   °· You have numbness, tingling, weakness, or paralysis in any part of your body.   °MAKE SURE YOU:  °· Understand these instructions. °· Will watch your condition. °· Will get help right away if you are not doing well or get worse. °Document Released: 02/25/2007 Document Revised: 05/05/2013 Document Reviewed: 11/05/2012 °Exit[employer]® Patient Information ©2015 [employer]. This information is not intended to replace advice given to you by your health care provider. Make sure you discuss any questions you have with your health care provider. ° °Head Injury °You have received a head injury. It does not appear serious at this time. Headaches and vomiting are common following head injury. It should be easy to awaken from  sleeping. Sometimes it is necessary for you to stay in the emergency department for a while for observation. Sometimes admission to the hospital may be needed. After injuries such as yours, most problems occur within the first 24 hours, but side effects may occur up to 7-10 days after the injury. It is important for you to carefully monitor your condition and contact your health care provider or seek immediate medical care if there is a change in your condition. °WHAT ARE THE TYPES OF HEAD INJURIES? °Head injuries can be as minor as a bump. Some head injuries can be more severe. More severe head injuries include: °· A jarring injury to the brain (concussion). °· A bruise of the brain (contusion). This mean there is bleeding in the brain that can cause swelling. °· A cracked skull (skull fracture). °· Bleeding in the brain that collects, clots, and forms a bump (hematoma). °WHAT CAUSES A HEAD INJURY? °A serious head injury is most likely to   happen to someone who is in a car wreck and is not wearing a seat belt. Other causes of major head injuries include bicycle or motorcycle accidents, sports injuries, and falls. °HOW ARE HEAD INJURIES DIAGNOSED? °A complete history of the event leading to the injury and your current symptoms will be helpful in diagnosing head injuries. Many times, pictures of the brain, such as CT or MRI are needed to see the extent of the injury. Often, an overnight hospital stay is necessary for observation.  °WHEN SHOULD I SEEK IMMEDIATE MEDICAL CARE?  °You should get help right away if: °· You have confusion or drowsiness. °· You feel sick to your stomach (nauseous) or have continued, forceful vomiting. °· You have dizziness or unsteadiness that is getting worse. °· You have severe, continued headaches not relieved by medicine. Only take over-the-counter or prescription medicines for pain, fever, or discomfort as directed by your health care provider. °· You do not have normal function of the  arms or legs or are unable to walk. °· You notice changes in the black spots in the center of the colored part of your eye (pupil). °· You have a clear or bloody fluid coming from your nose or ears. °· You have a loss of vision. °During the next 24 hours after the injury, you must stay with someone who can watch you for the warning signs. This person should contact local emergency services (911 in the [place] if you have seizures, you become unconscious, or you are unable to wake up. °HOW CAN I PREVENT A HEAD INJURY IN THE FUTURE? °The most important factor for preventing major head injuries is avoiding motor vehicle accidents.  To minimize the potential for damage to your head, it is crucial to wear seat belts while riding in motor vehicles. Wearing helmets while bike riding and playing collision sports (like football) is also helpful. Also, avoiding dangerous activities around the house will further help reduce your risk of head injury.  °WHEN CAN I RETURN TO NORMAL ACTIVITIES AND ATHLETICS? °You should be reevaluated by your health care provider before returning to these activities. If you have any of the following symptoms, you should not return to activities or contact sports until 1 week after the symptoms have stopped: °· Persistent headache. °· Dizziness or vertigo. °· Poor attention and concentration. °· Confusion. °· Memory problems. °· Nausea or vomiting. °· Fatigue or tire easily. °· Irritability. °· Intolerant of bright lights or loud noises. °· Anxiety or depression. °· Disturbed sleep. °MAKE SURE YOU:  °· Understand these instructions. °· Will watch your condition. °· Will get help right away if you are not doing well or get worse. °Document Released: 04/30/2005 Document Revised: 05/05/2013 Document Reviewed: 01/05/2013 °ExitCare® Patient Information ©2015 [employer]. This information is not intended to replace advice given to you by your health care provider. Make sure you discuss any questions you  have with your health care provider. ° °

## 2014-08-26 ENCOUNTER — Other Ambulatory Visit: Payer: Self-pay | Admitting: *Deleted

## 2014-08-26 DIAGNOSIS — I739 Peripheral vascular disease, unspecified: Secondary | ICD-10-CM

## 2014-08-26 DIAGNOSIS — Z48812 Encounter for surgical aftercare following surgery on the circulatory system: Secondary | ICD-10-CM

## 2014-08-27 ENCOUNTER — Encounter: Payer: Self-pay | Admitting: Family

## 2014-08-30 ENCOUNTER — Encounter (HOSPITAL_COMMUNITY): Payer: Self-pay

## 2014-08-30 ENCOUNTER — Ambulatory Visit: Payer: Medicare Other | Admitting: Family

## 2014-09-21 ENCOUNTER — Emergency Department (HOSPITAL_BASED_OUTPATIENT_CLINIC_OR_DEPARTMENT_OTHER)
Admission: EM | Admit: 2014-09-21 | Discharge: 2014-09-21 | Disposition: A | Discharge status: Home / Self Care | Payer: Medicare Other | Attending: Emergency Medicine | Admitting: Emergency Medicine

## 2014-09-21 ENCOUNTER — Emergency Department (HOSPITAL_BASED_OUTPATIENT_CLINIC_OR_DEPARTMENT_OTHER): Payer: Medicare Other

## 2014-09-21 ENCOUNTER — Encounter (HOSPITAL_BASED_OUTPATIENT_CLINIC_OR_DEPARTMENT_OTHER): Payer: Self-pay | Admitting: Emergency Medicine

## 2014-09-21 DIAGNOSIS — Z79899 Other long term (current) drug therapy: Secondary | ICD-10-CM | POA: Diagnosis not present

## 2014-09-21 DIAGNOSIS — I4891 Unspecified atrial fibrillation: Secondary | ICD-10-CM | POA: Diagnosis not present

## 2014-09-21 DIAGNOSIS — K219 Gastro-esophageal reflux disease without esophagitis: Secondary | ICD-10-CM | POA: Diagnosis not present

## 2014-09-21 DIAGNOSIS — Y9289 Other specified places as the place of occurrence of the external cause: Secondary | ICD-10-CM | POA: Insufficient documentation

## 2014-09-21 DIAGNOSIS — Z7902 Long term (current) use of antithrombotics/antiplatelets: Secondary | ICD-10-CM | POA: Diagnosis not present

## 2014-09-21 DIAGNOSIS — Z7982 Long term (current) use of aspirin: Secondary | ICD-10-CM | POA: Diagnosis not present

## 2014-09-21 DIAGNOSIS — I252 Old myocardial infarction: Secondary | ICD-10-CM | POA: Insufficient documentation

## 2014-09-21 DIAGNOSIS — F0390 Unspecified dementia without behavioral disturbance: Secondary | ICD-10-CM | POA: Diagnosis not present

## 2014-09-21 DIAGNOSIS — T148XXA Other injury of unspecified body region, initial encounter: Secondary | ICD-10-CM

## 2014-09-21 DIAGNOSIS — I25119 Atherosclerotic heart disease of native coronary artery with unspecified angina pectoris: Secondary | ICD-10-CM | POA: Diagnosis not present

## 2014-09-21 DIAGNOSIS — W1830XA Fall on same level, unspecified, initial encounter: Secondary | ICD-10-CM | POA: Diagnosis not present

## 2014-09-21 DIAGNOSIS — I509 Heart failure, unspecified: Secondary | ICD-10-CM | POA: Diagnosis not present

## 2014-09-21 DIAGNOSIS — M1990 Unspecified osteoarthritis, unspecified site: Secondary | ICD-10-CM | POA: Insufficient documentation

## 2014-09-21 DIAGNOSIS — S0083XA Contusion of other part of head, initial encounter: Secondary | ICD-10-CM | POA: Insufficient documentation

## 2014-09-21 DIAGNOSIS — E079 Disorder of thyroid, unspecified: Secondary | ICD-10-CM | POA: Diagnosis not present

## 2014-09-21 DIAGNOSIS — Y998 Other external cause status: Secondary | ICD-10-CM | POA: Insufficient documentation

## 2014-09-21 DIAGNOSIS — Z9861 Coronary angioplasty status: Secondary | ICD-10-CM | POA: Diagnosis not present

## 2014-09-21 DIAGNOSIS — E119 Type 2 diabetes mellitus without complications: Secondary | ICD-10-CM | POA: Diagnosis not present

## 2014-09-21 DIAGNOSIS — W19XXXA Unspecified fall, initial encounter: Secondary | ICD-10-CM

## 2014-09-21 DIAGNOSIS — Z9889 Other specified postprocedural states: Secondary | ICD-10-CM | POA: Diagnosis not present

## 2014-09-21 DIAGNOSIS — Z951 Presence of aortocoronary bypass graft: Secondary | ICD-10-CM | POA: Diagnosis not present

## 2014-09-21 DIAGNOSIS — S0990XA Unspecified injury of head, initial encounter: Secondary | ICD-10-CM | POA: Diagnosis present

## 2014-09-21 DIAGNOSIS — Y9389 Activity, other specified: Secondary | ICD-10-CM | POA: Diagnosis not present

## 2014-09-21 DIAGNOSIS — F329 Major depressive disorder, single episode, unspecified: Secondary | ICD-10-CM | POA: Insufficient documentation

## 2014-09-21 DIAGNOSIS — Z87891 Personal history of nicotine dependence: Secondary | ICD-10-CM | POA: Insufficient documentation

## 2014-09-21 DIAGNOSIS — I10 Essential (primary) hypertension: Secondary | ICD-10-CM | POA: Diagnosis not present

## 2014-09-21 MED ORDER — ACETAMINOPHEN 500 MG PO TABS
1000.0000 mg | ORAL_TABLET | Freq: Once | ORAL | Status: DC
  Filled 2014-09-21: qty 2

## 2014-09-21 MED ORDER — ACETAMINOPHEN 500 MG PO TABS
1000.0000 mg | ORAL_TABLET | Freq: Once | ORAL | Status: AC
  Administered 2014-09-21: 1000 mg via ORAL

## 2014-09-21 NOTE — ED Provider Notes (Signed)
CSN: [id number]     Arrival date & time 09/21/14  0127 History   First MD Initiated Contact with Patient 09/21/14 [phone]     Chief Complaint  Patient presents with  . Head Injury     (Consider location/radiation/quality/duration/timing/severity/associated sxs/prior Treatment) Patient is a 81 y.o. female presenting with head injury. The history is provided by the EMS personnel. History limited by: dementia level 5.  Head Injury Location:  Frontal Mechanism of injury: fall   Pain details:    Severity:  No pain   Progression:  Unchanged Chronicity:  Recurrent Relieved by:  Nothing Worsened by:  Nothing tried Ineffective treatments:  None tried Risk factors: being elderly     Past Medical History  Diagnosis Date  . Diabetes mellitus without complication   . Hypertension   . Coronary artery disease   . Thyroid disease   . Depression   . Memory loss   . PVD (peripheral vascular disease)   . CHF (congestive heart failure)   . Atrial fibrillation   . Myocardial infarction   . Dyspnea 06/01/2012    2D Echo - EF 60-65, mitral valve calcified annulus, left atrium severely dilated, right atrium moderately dilated  . Chest pain 10/07/2009    2D Echo - EF 50-55, mitral valve calcified annulus  . CAD (coronary artery disease) 03/01/2008    Lexiscan - EF 79, no ECG changes/EKG negative for ischemia, post-stress EF 86  . Cerebral atherosclerosis 10/15/2011    Extracranial Examination - mild-moderate amount smooth mixed density plaque elevating velocities within the proximal and mid segments of the internal carotid artery  . Claudication 09/01/2010    LEA Doppler - Bilateral ABIs-demonstrated moderate arterial insufficiency to the lower extremities at rest, Right CIA/EIA-known occlusive disease, PV Cath  . Anginal pain     not in in last month  . Dementia   . GERD (gastroesophageal reflux disease)     takes prevacid  . Arthritis    Past Surgical History  Procedure Laterality Date  .  Abdominal hysterectomy    . Aorto- femoral bipass  2011  . Joint replacement Left 2010    knee  . Cardiac catheterization  2012  . Cardiac catheterization  06/02/2012    Unchanged anatomy compared to 3 years ago, continue medical therapy  . Cardiac catheterization  10/06/2009    Occluded right coronary artery, left-to-right collateral noncritical left disease with occluded grafts  . Angioplasty Left 02/23/2010    Darcon patch, 80-90% stenosis proximal to the anastomosis  . Coronary artery bypass graft  1993  . Coronary angioplasty    . Eye surgery    . Axillary-femoral bypass graft Right 10/08/2012    Procedure: BYPASS GRAFT AXILLA-BIFEMORAL;  Surgeon: Pryor OchoaJames D Lawson, MD;  [facility];  Service: Vascular;  Laterality: Right;  . Left heart catheterization with coronary/graft angiogram N/A 06/02/2012    Procedure: LEFT HEART CATHETERIZATION WITH Isabel CapriceORONARY/GRAFT ANGIOGRAM;  Surgeon: Runell GessJonathan J Berry, MD;  [facility];  Service: Cardiovascular;  Laterality: N/A;   Family History  Problem Relation Age of Onset  . Heart disease Father   . Hypertension Father   . Heart attack Father   . Kidney failure Brother   . Hyperlipidemia Daughter    History  Substance Use Topics  . Smoking status: Former Smoker    Types: Cigarettes    Quit date: 05/15/1991  . Smokeless tobacco: Never Used  . Alcohol Use: No   OB History    No data  available     Review of Systems  Unable to perform ROS     Allergies  Codeine  Home Medications   Prior to Admission medications   Medication Sig Start Date End Date Taking? Authorizing Provider  acetaminophen (TYLENOL) 325 MG tablet Take 650 mg by mouth every 4 (four) hours as needed for pain.     Historical Provider, MD  albuterol (PROVENTIL HFA;VENTOLIN HFA) 108 (90 BASE) MCG/ACT inhaler Inhale 1-2 puffs into the lungs every 6 (six) hours as needed for wheezing. 12/31/12   Marlon Pel, PA-C  amLODipine (NORVASC) 5 MG tablet Take 5 mg by  mouth daily.    Historical Provider, MD  aspirin 81 MG chewable tablet Chew 81 mg by mouth daily.    Historical Provider, MD  atorvastatin (LIPITOR) 40 MG tablet Take 40 mg by mouth at bedtime.     Historical Provider, MD  cholecalciferol (VITAMIN D) 1000 UNITS tablet Take 1,000 Units by mouth daily.    Historical Provider, MD  clopidogrel (PLAVIX) 75 MG tablet Take 75 mg by mouth daily.    Historical Provider, MD  diazepam (VALIUM) 5 MG tablet Take 5 mg by mouth 2 (two) times daily.    Historical Provider, MD  diphenhydrAMINE (BENADRYL) 12.5 MG/5ML elixir Take 12.5 mg by mouth as needed (for allergic reaction to Norco).     Historical Provider, MD  donepezil (ARICEPT) 5 MG tablet Take 5 mg by mouth daily.    Historical Provider, MD  ferrous sulfate 325 (65 FE) MG tablet Take 325 mg by mouth daily with breakfast.    Historical Provider, MD  HYDROcodone-acetaminophen (NORCO/VICODIN) 5-325 MG per tablet Take 1 tablet by mouth every 6 (six) hours as needed for pain. 10/13/12   Lars Mage, PA-C  isosorbide mononitrate (IMDUR) 30 MG 24 hr tablet Take 30 mg by mouth at bedtime.    Historical Provider, MD  lansoprazole (PREVACID) 30 MG capsule Take 30 mg by mouth 2 (two) times daily.    Historical Provider, MD  levothyroxine (SYNTHROID, LEVOTHROID) 50 MCG tablet Take 50 mcg by mouth daily before breakfast.    Historical Provider, MD  losartan (COZAAR) 100 MG tablet Take 100 mg by mouth daily.    Historical Provider, MD  Melatonin 3 MG TABS Take 3 mg by mouth at bedtime.    Historical Provider, MD  metoprolol tartrate (LOPRESSOR) 25 MG tablet Take 25 mg by mouth at bedtime.    Historical Provider, MD  Naproxen Sodium 220 MG CAPS Take 220 mg by mouth daily with breakfast.    Historical Provider, MD  nitroGLYCERIN (NITROSTAT) 0.4 MG SL tablet Place 0.4 mg under the tongue every 5 (five) minutes as needed for chest pain.     Historical Provider, MD  omega-3 acid ethyl esters (LOVAZA) 1 G capsule Take 2 g by  mouth daily.    Historical Provider, MD  PARoxetine (PAXIL) 20 MG tablet Take 20 mg by mouth every morning.    Historical Provider, MD  pregabalin (LYRICA) 25 MG capsule Take 25 mg by mouth 2 (two) times daily.    Historical Provider, MD  ranolazine (RANEXA) 500 MG 12 hr tablet Take 1 tablet (500 mg total) by mouth 2 (two) times daily. 08/12/12   Brittainy M Simmons, PA-C   BP 114/86 mmHg  Pulse 85  Temp(Src) 98.7 F (37.1 C) (Oral)  Resp 20  SpO2 96% Physical Exam  Constitutional: She appears well-developed and well-nourished. No distress.  HENT:  Head: Normocephalic. Head is  without raccoon's eyes and without Battle's sign.    Right Ear: No hemotympanum.  Left Ear: No hemotympanum.  Mouth/Throat: Oropharynx is clear and moist. No oropharyngeal exudate.  Eyes: Conjunctivae and EOM are normal. Pupils are equal, round, and reactive to light.  Neck: Normal range of motion. Neck supple.  Cardiovascular: Normal rate, regular rhythm and intact distal pulses.   Pulmonary/Chest: Effort normal and breath sounds normal. No respiratory distress. She has no wheezes. She has no rales. She exhibits no tenderness.  Abdominal: Soft. Bowel sounds are normal. There is no tenderness. There is no rebound and no guarding.  Musculoskeletal: Normal range of motion. She exhibits no edema or tenderness.  No foreshortening nor rotation  Neurological: She is alert. She has normal reflexes.  Skin: Skin is warm and dry.  Psychiatric: She has a normal mood and affect.    ED Course  Procedures (including critical care time) Labs Review Labs Reviewed - No data to display  Imaging Review Ct Head Wo Contrast  09/21/2014   CLINICAL DATA:  Trauma. Unwitnessed fall. Struck back of head. Hematoma on the forehead from a fall 1 week ago. Headache and neck pain. No loss of consciousness.  EXAM: CT HEAD WITHOUT CONTRAST  CT CERVICAL SPINE WITHOUT CONTRAST  TECHNIQUE: Multidetector CT imaging of the head and cervical  spine was performed following the standard protocol without intravenous contrast. Multiplanar CT image reconstructions of the cervical spine were also generated.  COMPARISON:  08/16/2014  FINDINGS: CT HEAD FINDINGS  Diffuse cerebral atrophy. Mild ventricular dilatation consistent with central atrophy. Low-attenuation changes in the deep white matter consistent with small vessel ischemia. No mass effect or midline shift. No abnormal extra-axial fluid collections. Gray-white matter junctions are distinct. Basal cisterns are not effaced. No evidence of acute intracranial hemorrhage. No depressed skull fractures. Visualized paranasal sinuses and mastoid air cells are not opacified. Small subcutaneous scalp hematoma over the left anterior frontal region.  CT CERVICAL SPINE FINDINGS  Diffuse bone demineralization. Normal alignment of the cervical spine and facet joints. Degenerative changes throughout with narrowed cervical interspaces and associated endplate hypertrophic changes. Prominent degenerative changes at C1 to. No vertebral compression deformities. No prevertebral soft tissue swelling. C1-2 articulation appears intact. No focal bone lesion or bone destruction. Bone cortex and trabecular architecture appear intact. Vascular calcifications.  IMPRESSION: No acute intracranial abnormalities. Chronic atrophy and small vessel ischemic changes. Normal alignment of the cervical spine. Diffuse degenerative changes. No acute displaced fractures.   Electronically Signed   By: Burman NievesWilliam  Stevens M.D.   On: 09/21/2014 02:08   Ct Cervical Spine Wo Contrast  09/21/2014   CLINICAL DATA:  Trauma. Unwitnessed fall. Struck back of head. Hematoma on the forehead from a fall 1 week ago. Headache and neck pain. No loss of consciousness.  EXAM: CT HEAD WITHOUT CONTRAST  CT CERVICAL SPINE WITHOUT CONTRAST  TECHNIQUE: Multidetector CT imaging of the head and cervical spine was performed following the standard protocol without  intravenous contrast. Multiplanar CT image reconstructions of the cervical spine were also generated.  COMPARISON:  08/16/2014  FINDINGS: CT HEAD FINDINGS  Diffuse cerebral atrophy. Mild ventricular dilatation consistent with central atrophy. Low-attenuation changes in the deep white matter consistent with small vessel ischemia. No mass effect or midline shift. No abnormal extra-axial fluid collections. Gray-white matter junctions are distinct. Basal cisterns are not effaced. No evidence of acute intracranial hemorrhage. No depressed skull fractures. Visualized paranasal sinuses and mastoid air cells are not opacified. Small subcutaneous scalp hematoma over  the left anterior frontal region.  CT CERVICAL SPINE FINDINGS  Diffuse bone demineralization. Normal alignment of the cervical spine and facet joints. Degenerative changes throughout with narrowed cervical interspaces and associated endplate hypertrophic changes. Prominent degenerative changes at C1 to. No vertebral compression deformities. No prevertebral soft tissue swelling. C1-2 articulation appears intact. No focal bone lesion or bone destruction. Bone cortex and trabecular architecture appear intact. Vascular calcifications.  IMPRESSION: No acute intracranial abnormalities. Chronic atrophy and small vessel ischemic changes. Normal alignment of the cervical spine. Diffuse degenerative changes. No acute displaced fractures.   Electronically Signed   By: Burman Nieves M.D.   On: 09/21/2014 02:08     EKG Interpretation None      MDM   Final diagnoses:  Contusion  Fall, initial encounter    Tylenol ice and recommend bed alarm at facility   Jan Walters, MD 09/21/14 [phone]

## 2014-09-21 NOTE — ED Notes (Signed)
Patient transported to CT 

## 2014-09-21 NOTE — ED Notes (Signed)
Report called to facility, [facility] to take pt back

## 2014-09-21 NOTE — ED Notes (Signed)
Unwitnessed fall. Patient denies LOC - patient is a/o x 4

## 2014-10-12 ENCOUNTER — Encounter: Payer: Self-pay | Admitting: Family

## 2014-10-14 ENCOUNTER — Ambulatory Visit (HOSPITAL_COMMUNITY)
Admission: RE | Admit: 2014-10-14 | Discharge: 2014-10-14 | Disposition: A | Discharge status: Home / Self Care | Payer: Medicare Other | Source: Ambulatory Visit | Attending: Family | Admitting: Family

## 2014-10-14 ENCOUNTER — Encounter: Payer: Self-pay | Admitting: Family

## 2014-10-14 ENCOUNTER — Ambulatory Visit (INDEPENDENT_AMBULATORY_CARE_PROVIDER_SITE_OTHER): Payer: Medicare Other | Admitting: Family

## 2014-10-14 VITALS — BP 130/58 | HR 69 | Resp 16 | Ht 60.0 in | Wt 143.0 lb

## 2014-10-14 DIAGNOSIS — I779 Disorder of arteries and arterioles, unspecified: Secondary | ICD-10-CM

## 2014-10-14 DIAGNOSIS — I739 Peripheral vascular disease, unspecified: Secondary | ICD-10-CM

## 2014-10-14 DIAGNOSIS — Z87891 Personal history of nicotine dependence: Secondary | ICD-10-CM | POA: Diagnosis not present

## 2014-10-14 DIAGNOSIS — Z9889 Other specified postprocedural states: Secondary | ICD-10-CM

## 2014-10-14 DIAGNOSIS — Z95828 Presence of other vascular implants and grafts: Secondary | ICD-10-CM

## 2014-10-14 DIAGNOSIS — Z48812 Encounter for surgical aftercare following surgery on the circulatory system: Secondary | ICD-10-CM

## 2014-10-14 DIAGNOSIS — E1159 Type 2 diabetes mellitus with other circulatory complications: Secondary | ICD-10-CM | POA: Diagnosis not present

## 2014-10-14 DIAGNOSIS — E1151 Type 2 diabetes mellitus with diabetic peripheral angiopathy without gangrene: Secondary | ICD-10-CM

## 2014-10-14 NOTE — Progress Notes (Signed)
[facility] [place] HISTORY AND PHYSICAL -PAD  History of Present Illness [name] Short is a 81 y.o. female patient of Dr. Hart RochesterLawson who returns for continued followup regarding her right axillobifemoral bypass graft performed by Dr. Hart RochesterLawson on 10/08/2012. She had previously undergone aortobifemoral bypass grafting by Dr. Hart RochesterLawson in 1994 and this eventually occluded. She has no complaints regarding her lower extremities. She does complain of generalized fatigue. She is in a retirement facility. She denies any rest pain or history of nonhealing ulcers or infection.  She walks with a walker, states she walks 3 times/day.   The patient reports New Medical or Surgical History: slipped and fell in her bathtub May 2016, hit her head, denies LOC, states she was evaluated medically for this. She is getting over bronchitis. Pt denies any history of stroke or TIA.  Pt Diabetic: Yes, pt states in good control Pt smoker: former smoker, quit in 1993 when she had an MI.   Pt meds include: Statin :Yes Betablocker: Yes ASA: Yes Other anticoagulants/antiplatelets: Plavix  Past Medical History  Diagnosis Date  . Diabetes mellitus without complication   . Hypertension   . Coronary artery disease   . Thyroid disease   . Depression   . Memory loss   . PVD (peripheral vascular disease)   . CHF (congestive heart failure)   . Atrial fibrillation   . Myocardial infarction   . Dyspnea 06/01/2012    2D Echo - EF 60-65, mitral valve calcified annulus, left atrium severely dilated, right atrium moderately dilated  . Chest pain 10/07/2009    2D Echo - EF 50-55, mitral valve calcified annulus  . CAD (coronary artery disease) 03/01/2008    Lexiscan - EF 79, no ECG changes/EKG negative for ischemia, post-stress EF 86  . Cerebral atherosclerosis 10/15/2011    Extracranial Examination - mild-moderate amount smooth mixed density plaque elevating velocities within the proximal and mid segments of the  internal carotid artery  . Claudication 09/01/2010    LEA Doppler - Bilateral ABIs-demonstrated moderate arterial insufficiency to the lower extremities at rest, Right CIA/EIA-known occlusive disease, PV Cath  . Anginal pain     not in in last month  . Dementia   . GERD (gastroesophageal reflux disease)     takes prevacid  . Arthritis     Social History History  Substance Use Topics  . Smoking status: Former Smoker    Types: Cigarettes    Quit date: 05/15/1991  . Smokeless tobacco: Never Used  . Alcohol Use: No    Family History Family History  Problem Relation Age of Onset  . Heart disease Father   . Hypertension Father   . Heart attack Father   . Kidney failure Brother   . Hyperlipidemia Daughter     Past Surgical History  Procedure Laterality Date  . Abdominal hysterectomy    . Aorto- femoral bipass  2011  . Joint replacement Left 2010    knee  . Cardiac catheterization  2012  . Cardiac catheterization  06/02/2012    Unchanged anatomy compared to 3 years ago, continue medical therapy  . Cardiac catheterization  10/06/2009    Occluded right coronary artery, left-to-right collateral noncritical left disease with occluded grafts  . Angioplasty Left 02/23/2010    Darcon patch, 80-90% stenosis proximal to the anastomosis  . Coronary artery bypass graft  1993  . Coronary angioplasty    . Eye surgery    . Axillary-femoral bypass graft Right 10/08/2012  Procedure: BYPASS GRAFT AXILLA-BIFEMORAL;  Surgeon: Pryor Ochoa, MD;  Location: [facility] OR;  Service: Vascular;  Laterality: Right;  . Left heart catheterization with coronary/graft angiogram N/A 06/02/2012    Procedure: LEFT HEART CATHETERIZATION WITH Isabel Caprice;  Surgeon: Runell Gess, MD;  Location: [facility] CATH LAB;  Service: Cardiovascular;  Laterality: N/A;    Allergies  Allergen Reactions  . Codeine Itching    Current Outpatient Prescriptions  Medication Sig Dispense Refill  . acetaminophen  (TYLENOL) 325 MG tablet Take 650 mg by mouth every 4 (four) hours as needed for pain.     Marland Kitchen albuterol (PROVENTIL HFA;VENTOLIN HFA) 108 (90 BASE) MCG/ACT inhaler Inhale 1-2 puffs into the lungs every 6 (six) hours as needed for wheezing. 1 Inhaler 0  . amLODipine (NORVASC) 5 MG tablet Take 5 mg by mouth daily.    Marland Kitchen aspirin 81 MG chewable tablet Chew 81 mg by mouth daily.    Marland Kitchen atorvastatin (LIPITOR) 40 MG tablet Take 40 mg by mouth at bedtime.     . cholecalciferol (VITAMIN D) 1000 UNITS tablet Take 1,000 Units by mouth daily.    . clopidogrel (PLAVIX) 75 MG tablet Take 75 mg by mouth daily.    . diazepam (VALIUM) 5 MG tablet Take 5 mg by mouth 2 (two) times daily.    . diphenhydrAMINE (BENADRYL) 12.5 MG/5ML elixir Take 12.5 mg by mouth as needed (for allergic reaction to Norco).     . donepezil (ARICEPT) 5 MG tablet Take 5 mg by mouth daily.    . ferrous sulfate 325 (65 FE) MG tablet Take 325 mg by mouth daily with breakfast.    . HYDROcodone-acetaminophen (NORCO/VICODIN) 5-325 MG per tablet Take 1 tablet by mouth every 6 (six) hours as needed for pain. 40 tablet 0  . isosorbide mononitrate (IMDUR) 30 MG 24 hr tablet Take 30 mg by mouth at bedtime.    . lansoprazole (PREVACID) 30 MG capsule Take 30 mg by mouth 2 (two) times daily.    Marland Kitchen levothyroxine (SYNTHROID, LEVOTHROID) 50 MCG tablet Take 50 mcg by mouth daily before breakfast.    . losartan (COZAAR) 100 MG tablet Take 100 mg by mouth daily.    . Melatonin 3 MG TABS Take 3 mg by mouth at bedtime.    . metoprolol tartrate (LOPRESSOR) 25 MG tablet Take 25 mg by mouth at bedtime.    . Naproxen Sodium 220 MG CAPS Take 220 mg by mouth daily with breakfast.    . nitroGLYCERIN (NITROSTAT) 0.4 MG SL tablet Place 0.4 mg under the tongue every 5 (five) minutes as needed for chest pain.     Marland Kitchen omega-3 acid ethyl esters (LOVAZA) 1 G capsule Take 2 g by mouth daily.    Marland Kitchen PARoxetine (PAXIL) 20 MG tablet Take 20 mg by mouth every morning.    . pregabalin  (LYRICA) 25 MG capsule Take 25 mg by mouth 2 (two) times daily.    . ranolazine (RANEXA) 500 MG 12 hr tablet Take 1 tablet (500 mg total) by mouth 2 (two) times daily. 60 tablet 5   No current facility-administered medications for this visit.    ROS: See HPI for pertinent positives and negatives.   Physical Examination  Filed Vitals:   10/14/14 1047  BP: 130/58  Pulse: 69  Resp: 16  Height: 5' (1.524 m)  Weight: 143 lb (64.864 kg)  SpO2: 97%   Body mass index is 27.93 kg/(m^2).  General: A&O x 3, WDWN. Gait: slow, deliberate,  using a walker Eyes: PERRLA. Pulmonary: CTAB, without wheezes , rales or rhonchi. Occasional dry cough. Cardiac: irregular Rythm , without detected murmur.         Carotid Bruits Right Left   Negative Negative  Aorta is not palpable. Right axillofemoral graft pulse is palpable at right flank. Radial pulses: are 2+ palpable and =                           VASCULAR EXAM: Extremities without ischemic changes, without Gangrene; without open wounds. Bilateral bunions.                                                                                                          LE Pulses Right Left       FEMORAL  faintly palpable  faintly palpable        POPLITEAL  not palpable   not palpable       POSTERIOR TIBIAL  2+ palpable   faintly palpable        DORSALIS PEDIS      ANTERIOR TIBIAL 2+ palpable  faintly palpable    Abdomen: soft, NT, no palpable masses.  Skin: no rashes, no ulcers. Musculoskeletal: no muscle wasting or atrophy.  Neurologic: A&O X 3; Appropriate Affect ; SENSATION: normal; MOTOR FUNCTION:  moving all extremities equally, motor strength 4/5 throughout. Speech is fluent/normal. CN 2-12 intact.    Non-Invasive Vascular Imaging: DATE: 10/14/2014 ABI: RIGHT: 1.0 (0.94, 08/25/13), Waveforms: tri and biphasic, TBI: 0.88;  LEFT: 1.0 (0.99), Waveforms: tri and biphasic, TBI: 0.73   ASSESSMENT: [name] Short is a 81 y.o. female who is s/p  right axillobifemoral bypass graft performed by Dr. [name] on 10/08/2012. She had previously undergone aortobifemoral bypass grafting by Dr. [name] in 1994 and this eventually occluded. She has generalized fatigue but does not seem to have claudication in her legs, has no signs of ischemia or tissue loss in her lower extremities.  Today's ABI's are normal bilaterally with bi and triphasic waveforms; bilateral TBI's are also normal. Her atherosclerotic risk factors include controlled DM, distant history of smoking, and dyslipidemia (taking a statin). She also is on Plavix and ASA.    PLAN:  Continue walking, taking precautions not to fall. Seated leg exercises demonstrated and discussed also.   I discussed in depth with the patient the nature of atherosclerosis, and emphasized the importance of maximal medical management including strict control of blood pressure, blood glucose, and lipid levels, obtaining regular exercise, and continued cessation of smoking.  The patient is aware that without maximal medical management the underlying atherosclerotic disease process will progress, limiting the benefit of any interventions.  Based on the patient's vascular studies and examination, pt will return to clinic in 1 year for ABI's.   The patient was given information about PAD including signs, symptoms, treatment, what symptoms should prompt the patient to seek immediate medical care, and risk reduction measures to take.  Charisse March, RN, MSN, FNP-C [facility] [place] Phone: [phone]  Clinic MD: [facility]  10/14/2014 10:42 AM

## 2014-10-14 NOTE — Patient Instructions (Signed)

## 2015-10-02 ENCOUNTER — Emergency Department (HOSPITAL_COMMUNITY): Payer: Medicare Other

## 2015-10-02 ENCOUNTER — Observation Stay (HOSPITAL_COMMUNITY): Payer: Medicare Other

## 2015-10-02 ENCOUNTER — Inpatient Hospital Stay (HOSPITAL_COMMUNITY)
Admission: EM | Admit: 2015-10-02 | Discharge: 2015-10-05 | DRG: 064 | Disposition: A | Discharge status: Skilled Nursing Facility | Payer: Medicare Other | Attending: Family Medicine | Admitting: Family Medicine

## 2015-10-02 ENCOUNTER — Encounter (HOSPITAL_COMMUNITY): Payer: Self-pay

## 2015-10-02 DIAGNOSIS — G459 Transient cerebral ischemic attack, unspecified: Secondary | ICD-10-CM

## 2015-10-02 DIAGNOSIS — Z885 Allergy status to narcotic agent status: Secondary | ICD-10-CM

## 2015-10-02 DIAGNOSIS — I6340 Cerebral infarction due to embolism of unspecified cerebral artery: Secondary | ICD-10-CM | POA: Diagnosis not present

## 2015-10-02 DIAGNOSIS — K219 Gastro-esophageal reflux disease without esophagitis: Secondary | ICD-10-CM | POA: Diagnosis present

## 2015-10-02 DIAGNOSIS — I639 Cerebral infarction, unspecified: Secondary | ICD-10-CM | POA: Diagnosis present

## 2015-10-02 DIAGNOSIS — J9601 Acute respiratory failure with hypoxia: Secondary | ICD-10-CM | POA: Diagnosis not present

## 2015-10-02 DIAGNOSIS — Z90710 Acquired absence of both cervix and uterus: Secondary | ICD-10-CM

## 2015-10-02 DIAGNOSIS — I4891 Unspecified atrial fibrillation: Secondary | ICD-10-CM | POA: Diagnosis present

## 2015-10-02 DIAGNOSIS — E785 Hyperlipidemia, unspecified: Secondary | ICD-10-CM | POA: Diagnosis present

## 2015-10-02 DIAGNOSIS — I110 Hypertensive heart disease with heart failure: Secondary | ICD-10-CM | POA: Diagnosis present

## 2015-10-02 DIAGNOSIS — E119 Type 2 diabetes mellitus without complications: Secondary | ICD-10-CM | POA: Diagnosis present

## 2015-10-02 DIAGNOSIS — M1990 Unspecified osteoarthritis, unspecified site: Secondary | ICD-10-CM | POA: Diagnosis present

## 2015-10-02 DIAGNOSIS — I25810 Atherosclerosis of coronary artery bypass graft(s) without angina pectoris: Secondary | ICD-10-CM | POA: Diagnosis present

## 2015-10-02 DIAGNOSIS — Z7952 Long term (current) use of systemic steroids: Secondary | ICD-10-CM

## 2015-10-02 DIAGNOSIS — Z87891 Personal history of nicotine dependence: Secondary | ICD-10-CM

## 2015-10-02 DIAGNOSIS — I739 Peripheral vascular disease, unspecified: Secondary | ICD-10-CM | POA: Diagnosis present

## 2015-10-02 DIAGNOSIS — E876 Hypokalemia: Secondary | ICD-10-CM | POA: Diagnosis present

## 2015-10-02 DIAGNOSIS — Z96652 Presence of left artificial knee joint: Secondary | ICD-10-CM | POA: Diagnosis present

## 2015-10-02 DIAGNOSIS — W19XXXA Unspecified fall, initial encounter: Secondary | ICD-10-CM | POA: Diagnosis present

## 2015-10-02 DIAGNOSIS — Z951 Presence of aortocoronary bypass graft: Secondary | ICD-10-CM

## 2015-10-02 DIAGNOSIS — Z7902 Long term (current) use of antithrombotics/antiplatelets: Secondary | ICD-10-CM

## 2015-10-02 DIAGNOSIS — I252 Old myocardial infarction: Secondary | ICD-10-CM

## 2015-10-02 DIAGNOSIS — Z66 Do not resuscitate: Secondary | ICD-10-CM | POA: Diagnosis present

## 2015-10-02 DIAGNOSIS — Z79899 Other long term (current) drug therapy: Secondary | ICD-10-CM

## 2015-10-02 DIAGNOSIS — Z9181 History of falling: Secondary | ICD-10-CM

## 2015-10-02 DIAGNOSIS — I2781 Cor pulmonale (chronic): Secondary | ICD-10-CM | POA: Diagnosis present

## 2015-10-02 DIAGNOSIS — I340 Nonrheumatic mitral (valve) insufficiency: Secondary | ICD-10-CM | POA: Diagnosis present

## 2015-10-02 DIAGNOSIS — Z7982 Long term (current) use of aspirin: Secondary | ICD-10-CM

## 2015-10-02 DIAGNOSIS — R29702 NIHSS score 2: Secondary | ICD-10-CM | POA: Diagnosis present

## 2015-10-02 DIAGNOSIS — F0390 Unspecified dementia without behavioral disturbance: Secondary | ICD-10-CM | POA: Diagnosis present

## 2015-10-02 DIAGNOSIS — R0602 Shortness of breath: Secondary | ICD-10-CM

## 2015-10-02 DIAGNOSIS — R0902 Hypoxemia: Secondary | ICD-10-CM

## 2015-10-02 DIAGNOSIS — I480 Paroxysmal atrial fibrillation: Secondary | ICD-10-CM | POA: Diagnosis present

## 2015-10-02 DIAGNOSIS — I509 Heart failure, unspecified: Secondary | ICD-10-CM | POA: Diagnosis present

## 2015-10-02 DIAGNOSIS — I10 Essential (primary) hypertension: Secondary | ICD-10-CM | POA: Diagnosis present

## 2015-10-02 HISTORY — DX: Unspecified chronic bronchitis: J42

## 2015-10-02 HISTORY — DX: Hypothyroidism, unspecified: E03.9

## 2015-10-02 HISTORY — DX: Pure hypercholesterolemia, unspecified: E78.00

## 2015-10-02 HISTORY — DX: Vascular dementia, unspecified severity, without behavioral disturbance, psychotic disturbance, mood disturbance, and anxiety: F01.50

## 2015-10-02 HISTORY — DX: Anxiety disorder, unspecified: F41.9

## 2015-10-02 HISTORY — DX: Cerebral infarction, unspecified: I63.9

## 2015-10-02 HISTORY — DX: Type 2 diabetes mellitus without complications: E11.9

## 2015-10-02 HISTORY — DX: Adverse effect of unspecified anesthetic, initial encounter: T41.45XA

## 2015-10-02 HISTORY — DX: Other complications of anesthesia, initial encounter: T88.59XA

## 2015-10-02 LAB — CBC WITH DIFFERENTIAL/PLATELET
BASOS ABS: 0 10*3/uL (ref 0.0–0.1)
Basophils Relative: 0 %
Eosinophils Absolute: 0.2 10*3/uL (ref 0.0–0.7)
Eosinophils Relative: 2 %
HCT: 31.9 % — ABNORMAL LOW (ref 36.0–46.0)
Hemoglobin: 10.1 g/dL — ABNORMAL LOW (ref 12.0–15.0)
LYMPHS PCT: 10 %
Lymphs Abs: 1.1 10*3/uL (ref 0.7–4.0)
MCH: 29.1 pg (ref 26.0–34.0)
MCHC: 31.7 g/dL (ref 30.0–36.0)
MCV: 91.9 fL (ref 78.0–100.0)
Monocytes Absolute: 1.3 10*3/uL — ABNORMAL HIGH (ref 0.1–1.0)
Monocytes Relative: 12 %
NEUTROS ABS: 8 10*3/uL — AB (ref 1.7–7.7)
Neutrophils Relative %: 76 %
Platelets: 209 10*3/uL (ref 150–400)
RBC: 3.47 MIL/uL — AB (ref 3.87–5.11)
RDW: 15.6 % — ABNORMAL HIGH (ref 11.5–15.5)
WBC: 10.5 10*3/uL (ref 4.0–10.5)

## 2015-10-02 LAB — BLOOD GAS, ARTERIAL
ACID-BASE EXCESS: 0.9 mmol/L (ref 0.0–2.0)
BICARBONATE: 24.7 meq/L — AB (ref 20.0–24.0)
Drawn by: 36277
FIO2: 35
O2 Saturation: 99.3 %
PATIENT TEMPERATURE: 98.6
PCO2 ART: 37.4 mmHg (ref 35.0–45.0)
PO2 ART: 145 mmHg — AB (ref 80.0–100.0)
TCO2: 25.8 mmol/L (ref 0–100)
pH, Arterial: 7.435 (ref 7.350–7.450)

## 2015-10-02 LAB — BRAIN NATRIURETIC PEPTIDE: B NATRIURETIC PEPTIDE 5: 680.6 pg/mL — AB (ref 0.0–100.0)

## 2015-10-02 LAB — I-STAT TROPONIN, ED: TROPONIN I, POC: 0.03 ng/mL (ref 0.00–0.08)

## 2015-10-02 LAB — BASIC METABOLIC PANEL
Anion gap: 9 (ref 5–15)
BUN: 11 mg/dL (ref 6–20)
CO2: 29 mmol/L (ref 22–32)
Calcium: 8.9 mg/dL (ref 8.9–10.3)
Chloride: 99 mmol/L — ABNORMAL LOW (ref 101–111)
Creatinine, Ser: 1.09 mg/dL — ABNORMAL HIGH (ref 0.44–1.00)
GFR calc Af Amer: 52 mL/min — ABNORMAL LOW (ref >60)
GFR calc non Af Amer: 45 mL/min — ABNORMAL LOW (ref >60)
GLUCOSE: 121 mg/dL — AB (ref 65–99)
POTASSIUM: 3.4 mmol/L — AB (ref 3.5–5.1)
Sodium: 137 mmol/L (ref 135–145)

## 2015-10-02 LAB — TROPONIN I: Troponin I: 0.04 ng/mL — ABNORMAL HIGH (ref <0.031)

## 2015-10-02 LAB — D-DIMER, QUANTITATIVE: D-Dimer, Quant: 2.34 ug/mL-FEU — ABNORMAL HIGH (ref 0.00–0.50)

## 2015-10-02 MED ORDER — SODIUM CHLORIDE 0.9% FLUSH
3.0000 mL | Freq: Two times a day (BID) | INTRAVENOUS | Status: DC
  Administered 2015-10-02 – 2015-10-05 (×6): 3 mL via INTRAVENOUS

## 2015-10-02 MED ORDER — LORATADINE 10 MG PO TABS
10.0000 mg | ORAL_TABLET | Freq: Every day | ORAL | Status: DC
  Administered 2015-10-03 – 2015-10-05 (×3): 10 mg via ORAL
  Filled 2015-10-02 (×3): qty 1

## 2015-10-02 MED ORDER — SODIUM CHLORIDE 0.9 % IV BOLUS (SEPSIS)
500.0000 mL | Freq: Once | INTRAVENOUS | Status: AC
  Administered 2015-10-02: 500 mL via INTRAVENOUS

## 2015-10-02 MED ORDER — CLOPIDOGREL BISULFATE 75 MG PO TABS
75.0000 mg | ORAL_TABLET | Freq: Every day | ORAL | Status: DC
  Administered 2015-10-03: 75 mg via ORAL
  Filled 2015-10-02: qty 1

## 2015-10-02 MED ORDER — CALCIUM CARBONATE-VITAMIN D 500-200 MG-UNIT PO TABS
1.0000 | ORAL_TABLET | Freq: Two times a day (BID) | ORAL | Status: DC
  Administered 2015-10-03 – 2015-10-05 (×5): 1 via ORAL
  Filled 2015-10-02 (×5): qty 1

## 2015-10-02 MED ORDER — IPRATROPIUM-ALBUTEROL 20-100 MCG/ACT IN AERS: 1.0000 | INHALATION_SPRAY | Freq: Four times a day (QID) | RESPIRATORY_TRACT | Status: DC | PRN

## 2015-10-02 MED ORDER — PAROXETINE HCL 20 MG PO TABS
30.0000 mg | ORAL_TABLET | Freq: Every day | ORAL | Status: DC
  Administered 2015-10-03 – 2015-10-05 (×3): 30 mg via ORAL
  Filled 2015-10-02 (×3): qty 2

## 2015-10-02 MED ORDER — LOSARTAN POTASSIUM 50 MG PO TABS
100.0000 mg | ORAL_TABLET | Freq: Every day | ORAL | Status: DC
  Administered 2015-10-03 – 2015-10-05 (×3): 100 mg via ORAL
  Filled 2015-10-02 (×3): qty 2

## 2015-10-02 MED ORDER — VITAMIN D 1000 UNITS PO TABS
1000.0000 [IU] | ORAL_TABLET | Freq: Every day | ORAL | Status: DC
  Administered 2015-10-03 – 2015-10-05 (×3): 1000 [IU] via ORAL
  Filled 2015-10-02 (×3): qty 1

## 2015-10-02 MED ORDER — HYDROCODONE-ACETAMINOPHEN 5-325 MG PO TABS
1.0000 | ORAL_TABLET | Freq: Four times a day (QID) | ORAL | Status: DC | PRN
  Administered 2015-10-04 – 2015-10-05 (×3): 1 via ORAL
  Filled 2015-10-02 (×3): qty 1

## 2015-10-02 MED ORDER — TRAZODONE HCL 50 MG PO TABS
75.0000 mg | ORAL_TABLET | Freq: Every day | ORAL | Status: DC
  Administered 2015-10-03 – 2015-10-04 (×2): 75 mg via ORAL
  Filled 2015-10-02 (×2): qty 2

## 2015-10-02 MED ORDER — SENNA 8.6 MG PO TABS
1.0000 | ORAL_TABLET | Freq: Every day | ORAL | Status: DC
  Administered 2015-10-03 – 2015-10-04 (×2): 8.6 mg via ORAL
  Filled 2015-10-02 (×5): qty 1

## 2015-10-02 MED ORDER — METOPROLOL TARTRATE 5 MG/5ML IV SOLN
2.5000 mg | Freq: Four times a day (QID) | INTRAVENOUS | Status: DC
  Administered 2015-10-02 – 2015-10-04 (×7): 2.5 mg via INTRAVENOUS
  Filled 2015-10-02 (×7): qty 5

## 2015-10-02 MED ORDER — PREDNISONE 20 MG PO TABS
20.0000 mg | ORAL_TABLET | Freq: Every day | ORAL | Status: DC
  Administered 2015-10-03 – 2015-10-05 (×3): 20 mg via ORAL
  Filled 2015-10-02 (×3): qty 1

## 2015-10-02 MED ORDER — OMEGA-3-ACID ETHYL ESTERS 1 G PO CAPS
2.0000 g | ORAL_CAPSULE | Freq: Every day | ORAL | Status: DC
  Administered 2015-10-03 – 2015-10-05 (×3): 2 g via ORAL
  Filled 2015-10-02 (×3): qty 2

## 2015-10-02 MED ORDER — LEVOTHYROXINE SODIUM 75 MCG PO TABS
37.5000 ug | ORAL_TABLET | Freq: Every day | ORAL | Status: DC
  Administered 2015-10-03 – 2015-10-05 (×3): 37.5 ug via ORAL
  Filled 2015-10-02 (×3): qty 1

## 2015-10-02 MED ORDER — FAMOTIDINE 20 MG PO TABS
20.0000 mg | ORAL_TABLET | Freq: Every day | ORAL | Status: DC
  Administered 2015-10-03 – 2015-10-05 (×3): 20 mg via ORAL
  Filled 2015-10-02 (×3): qty 1

## 2015-10-02 MED ORDER — HYDROCORTISONE NA SUCCINATE PF 100 MG IJ SOLR
50.0000 mg | Freq: Once | INTRAMUSCULAR | Status: AC
  Administered 2015-10-02: 50 mg via INTRAVENOUS
  Filled 2015-10-02: qty 2

## 2015-10-02 MED ORDER — STROKE: EARLY STAGES OF RECOVERY BOOK
Freq: Once | Status: AC
  Administered 2015-10-02: 1
  Filled 2015-10-02: qty 1

## 2015-10-02 MED ORDER — RANOLAZINE ER 500 MG PO TB12
500.0000 mg | ORAL_TABLET | Freq: Two times a day (BID) | ORAL | Status: DC
  Administered 2015-10-03 – 2015-10-05 (×5): 500 mg via ORAL
  Filled 2015-10-02 (×7): qty 1

## 2015-10-02 MED ORDER — ENOXAPARIN SODIUM 40 MG/0.4ML ~~LOC~~ SOLN
40.0000 mg | SUBCUTANEOUS | Status: DC
  Administered 2015-10-02 – 2015-10-03 (×2): 40 mg via SUBCUTANEOUS
  Filled 2015-10-02 (×2): qty 0.4

## 2015-10-02 MED ORDER — DONEPEZIL HCL 10 MG PO TABS
10.0000 mg | ORAL_TABLET | Freq: Every day | ORAL | Status: DC
  Administered 2015-10-03 – 2015-10-04 (×2): 10 mg via ORAL
  Filled 2015-10-02 (×2): qty 1

## 2015-10-02 MED ORDER — ASPIRIN EC 81 MG PO TBEC
81.0000 mg | DELAYED_RELEASE_TABLET | Freq: Every day | ORAL | Status: DC
  Administered 2015-10-03 – 2015-10-05 (×3): 81 mg via ORAL
  Filled 2015-10-02 (×3): qty 1

## 2015-10-02 MED ORDER — ALBUTEROL SULFATE (2.5 MG/3ML) 0.083% IN NEBU: 3.0000 mL | INHALATION_SOLUTION | Freq: Four times a day (QID) | RESPIRATORY_TRACT | Status: DC | PRN

## 2015-10-02 MED ORDER — LORAZEPAM 2 MG/ML IJ SOLN
1.0000 mg | Freq: Once | INTRAMUSCULAR | Status: AC
  Administered 2015-10-02: 1 mg via INTRAVENOUS
  Filled 2015-10-02: qty 1

## 2015-10-02 MED ORDER — FUROSEMIDE 10 MG/ML IJ SOLN
INTRAMUSCULAR | Status: AC
  Administered 2015-10-02: 10 mg
  Filled 2015-10-02: qty 2

## 2015-10-02 MED ORDER — METOPROLOL SUCCINATE ER 25 MG PO TB24: 25.0000 mg | ORAL_TABLET | Freq: Every day | ORAL | Status: DC

## 2015-10-02 MED ORDER — IPRATROPIUM-ALBUTEROL 0.5-2.5 (3) MG/3ML IN SOLN: 3.0000 mL | Freq: Four times a day (QID) | RESPIRATORY_TRACT | Status: DC | PRN

## 2015-10-02 MED ORDER — MECLIZINE HCL 25 MG PO TABS
12.5000 mg | ORAL_TABLET | Freq: Once | ORAL | Status: AC
  Administered 2015-10-02: 12.5 mg via ORAL
  Filled 2015-10-02: qty 1

## 2015-10-02 NOTE — ED Provider Notes (Signed)
CSN: [id number]     Arrival date & time 10/02/15  1724 History   First MD Initiated Contact with Patient 10/02/15 1728     Chief Complaint  Patient presents with  . Weakness  . Fall     (Consider location/radiation/quality/duration/timing/severity/associated sxs/prior Treatment) The history is provided by the patient.  [name] Short is a 82 y.o. female hx of DM, HTN, CAD, afib, MI Here presenting with fall. Patient resides at [facility]. States that she became dizzy since yesterday. States that the room was spinning and she fell over a chair and hit her head. She states that dizziness is unusual for her and has no hx of BPPV. Denies vomiting or fevers. Denies chest pain    Past Medical History  Diagnosis Date  . Diabetes mellitus without complication (HCC)   . Hypertension   . Coronary artery disease   . Thyroid disease   . Depression   . Memory loss   . PVD (peripheral vascular disease) (HCC)   . CHF (congestive heart failure) (HCC)   . Atrial fibrillation (HCC)   . Myocardial infarction (HCC)   . Dyspnea 06/01/2012    2D Echo - EF 60-65, mitral valve calcified annulus, left atrium severely dilated, right atrium moderately dilated  . Chest pain 10/07/2009    2D Echo - EF 50-55, mitral valve calcified annulus  . CAD (coronary artery disease) 03/01/2008    Lexiscan - EF 79, no ECG changes/EKG negative for ischemia, post-stress EF 86  . Cerebral atherosclerosis 10/15/2011    Extracranial Examination - mild-moderate amount smooth mixed density plaque elevating velocities within the proximal and mid segments of the internal carotid artery  . Claudication (HCC) 09/01/2010    LEA Doppler - Bilateral ABIs-demonstrated moderate arterial insufficiency to the lower extremities at rest, Right CIA/EIA-known occlusive disease, PV Cath  . Anginal pain (HCC)     not in in last month  . Dementia   . GERD (gastroesophageal reflux disease)     takes prevacid  . Arthritis    Past Surgical  History  Procedure Laterality Date  . Abdominal hysterectomy    . Aorto- femoral bipass  2011  . Joint replacement Left 2010    knee  . Cardiac catheterization  2012  . Cardiac catheterization  06/02/2012    Unchanged anatomy compared to 3 years ago, continue medical therapy  . Cardiac catheterization  10/06/2009    Occluded right coronary artery, left-to-right collateral noncritical left disease with occluded grafts  . Angioplasty Left 02/23/2010    Darcon patch, 80-90% stenosis proximal to the anastomosis  . Coronary artery bypass graft  1993  . Coronary angioplasty    . Eye surgery    . Axillary-femoral bypass graft Right 10/08/2012    Procedure: BYPASS GRAFT AXILLA-BIFEMORAL;  Surgeon: Pryor Ochoa, MD;  Location: [facility] OR;  Service: Vascular;  Laterality: Right;  . Left heart catheterization with coronary/graft angiogram N/A 06/02/2012    Procedure: LEFT HEART CATHETERIZATION WITH Isabel Caprice;  Surgeon: Runell Gess, MD;  Location: [facility] CATH LAB;  Service: Cardiovascular;  Laterality: N/A;   Family History  Problem Relation Age of Onset  . Heart disease Father   . Hypertension Father   . Heart attack Father   . Kidney failure Brother   . Hyperlipidemia Daughter    Social History  Substance Use Topics  . Smoking status: Former Smoker    Types: Cigarettes    Quit date: 05/15/1991  . Smokeless tobacco: Never Used  .  Alcohol Use: No   OB History    No data available     Review of Systems  Neurological: Positive for weakness.  All other systems reviewed and are negative.     Allergies  Codeine  Home Medications   Prior to Admission medications   Medication Sig Start Date End Date Taking? Authorizing Provider  acetaminophen (TYLENOL) 325 MG tablet Take 650 mg by mouth every 4 (four) hours as needed for pain.    Yes Historical Provider, MD  albuterol (PROVENTIL HFA;VENTOLIN HFA) 108 (90 BASE) MCG/ACT inhaler Inhale 1-2 puffs into the lungs every 6 (six)  hours as needed for wheezing. 12/31/12  Yes Marlon Pel, PA-C  amoxicillin (AMOXIL) 500 MG capsule Take 2,000 mg by mouth as needed (for dental procedures).    Yes Historical Provider, MD  amoxicillin (AMOXIL) 500 MG tablet Take 500 mg by mouth 4 (four) times daily.   Yes Historical Provider, MD  aspirin EC 81 MG tablet Take 81 mg by mouth daily.   Yes Historical Provider, MD  calcium-vitamin D (OSCAL WITH D) 500-200 MG-UNIT tablet Take 1 tablet by mouth 2 (two) times daily.   Yes Historical Provider, MD  cetirizine (ZYRTEC) 10 MG tablet Take 10 mg by mouth daily.   Yes Historical Provider, MD  cholecalciferol (VITAMIN D) 1000 UNITS tablet Take 1,000 Units by mouth daily.   Yes Historical Provider, MD  clopidogrel (PLAVIX) 75 MG tablet Take 75 mg by mouth daily.   Yes Historical Provider, MD  diphenhydrAMINE-zinc acetate (BENADRYL) cream Apply 1 application topically 2 (two) times daily.   Yes Historical Provider, MD  donepezil (ARICEPT) 5 MG tablet Take 10 mg by mouth at bedtime.    Yes Historical Provider, MD  famotidine (PEPCID) 20 MG tablet Take 20 mg by mouth daily.   Yes Historical Provider, MD  furosemide (LASIX) 20 MG tablet Take 20 mg by mouth daily. 10/01/15 10/04/15 Yes Historical Provider, MD  HYDROcodone-acetaminophen (NORCO/VICODIN) 5-325 MG per tablet Take 1 tablet by mouth every 6 (six) hours as needed for pain. 10/13/12  Yes Lars Mage, PA-C  ibuprofen (ADVIL,MOTRIN) 800 MG tablet Take 800 mg by mouth every 8 (eight) hours as needed for moderate pain.    Yes Historical Provider, MD  Ipratropium-Albuterol (COMBIVENT RESPIMAT) 20-100 MCG/ACT AERS respimat Inhale 1 puff into the lungs every 6 (six) hours as needed for wheezing.   Yes Historical Provider, MD  levothyroxine (SYNTHROID, LEVOTHROID) 25 MCG tablet Take 37.5 mcg by mouth daily before breakfast.   Yes Historical Provider, MD  loperamide (IMODIUM) 2 MG capsule Take by mouth as needed for diarrhea or loose stools.   Yes  Historical Provider, MD  losartan (COZAAR) 100 MG tablet Take 100 mg by mouth daily.   Yes Historical Provider, MD  metoprolol succinate (TOPROL-XL) 25 MG 24 hr tablet Take 25 mg by mouth at bedtime.   Yes Historical Provider, MD  Naproxen Sodium 220 MG CAPS Take 220 mg by mouth daily with breakfast. All Day Relief- Gluten-Free Tab.   Yes Historical Provider, MD  nitroGLYCERIN (NITROSTAT) 0.4 MG SL tablet Place 0.4 mg under the tongue every 5 (five) minutes as needed for chest pain.    Yes Historical Provider, MD  omega-3 acid ethyl esters (LOVAZA) 1 G capsule Take 2 g by mouth daily.   Yes Historical Provider, MD  PARoxetine (PAXIL) 30 MG tablet Take 30 mg by mouth daily.   Yes Historical Provider, MD  polyvinyl alcohol (LIQUIFILM TEARS) 1.4 % ophthalmic  solution Place 1 drop into both eyes 4 (four) times daily - after meals and at bedtime.   Yes Historical Provider, MD  predniSONE (DELTASONE) 20 MG tablet Take 20 mg by mouth daily with breakfast.   Yes Historical Provider, MD  ranolazine (RANEXA) 500 MG 12 hr tablet Take 1 tablet (500 mg total) by mouth 2 (two) times daily. 08/12/12  Yes Brittainy Sherlynn Carbon, PA-C  senna (SENOKOT) 8.6 MG tablet Take 1 tablet by mouth at bedtime.   Yes Historical Provider, MD  traZODone (DESYREL) 50 MG tablet Take 75 mg by mouth at bedtime.   Yes Historical Provider, MD   BP 105/65 mmHg  Pulse 88  Resp 14  SpO2 94% Physical Exam  Constitutional: She is oriented to person, place, and time. She appears well-developed and well-nourished.  HENT:  Head: Normocephalic.  Mouth/Throat: Oropharynx is clear and moist.  Eyes: Conjunctivae are normal. Pupils are equal, round, and reactive to light.  + rotatory nystagmus   Neck: Normal range of motion. Neck supple.  Cardiovascular: Normal rate, regular rhythm and normal heart sounds.   Pulmonary/Chest: Effort normal and breath sounds normal. No respiratory distress. She has no wheezes. She has no rales.  Abdominal: Soft.  Bowel sounds are normal. She exhibits no distension. There is no tenderness. There is no rebound.  Musculoskeletal: Normal range of motion. She exhibits no edema or tenderness.  Pelvis stable, no obvious extremity trauma   Neurological: She is alert and oriented to person, place, and time.  CN 2-12 intact. Baseline tremors, ? Abnormal finger to nose but difficult to assess due to tremors. Nl strength throughout   Skin: Skin is warm and dry.  Psychiatric: She has a normal mood and affect. Her behavior is normal. Judgment and thought content normal.  Nursing note and vitals reviewed.   ED Course  Procedures (including critical care time) Labs Review Labs Reviewed  CBC WITH DIFFERENTIAL/PLATELET - Abnormal; Notable for the following:    RBC 3.47 (*)    Hemoglobin 10.1 (*)    HCT 31.9 (*)    RDW 15.6 (*)    Neutro Abs 8.0 (*)    Monocytes Absolute 1.3 (*)    All other components within normal limits  BASIC METABOLIC PANEL - Abnormal; Notable for the following:    Potassium 3.4 (*)    Chloride 99 (*)    Glucose, Bld 121 (*)    Creatinine, Ser 1.09 (*)    GFR calc non Af Amer 45 (*)    GFR calc Af Amer 52 (*)    All other components within normal limits  I-STAT TROPOININ, ED    Imaging Review Ct Head Wo Contrast  10/02/2015  CLINICAL DATA:  Unwitnessed fall, dizziness. History of diabetes, hypertension and dementia. EXAM: CT HEAD WITHOUT CONTRAST CT CERVICAL SPINE WITHOUT CONTRAST TECHNIQUE: Multidetector CT imaging of the head and cervical spine was performed following the standard protocol without intravenous contrast. Multiplanar CT image reconstructions of the cervical spine were also generated. COMPARISON:  Brain MRI from earlier same day. Comparison also made to head CT dated 09/23/2014 and cervical spine CT dated 09/21/2014. FINDINGS: CT HEAD FINDINGS There is mild generalized age-related brain atrophy with commensurate dilatation of the ventricles and sulci. Chronic small vessel  ischemic changes noted within the periventricular white matter regions bilaterally. There is no mass, hemorrhage, edema or other evidence of acute parenchymal abnormality. No extra-axial hemorrhage. No osseous fracture or displacement. CT CERVICAL SPINE FINDINGS Degenerative changes again noted throughout  the cervical spine, with associated disc space narrowings and osseous spurring at multiple levels, not significantly changed compared to the previous cervical spine CT of 09/21/2014. Associated mild disc bulges at multiple levels, but no more than mild central canal stenosis at any level. Osseous alignment is stable. No fracture line or displaced fracture fragment identified. Facet joints remain normally aligned. Atherosclerotic calcifications noted at each carotid bulb region. Paravertebral soft tissues otherwise unremarkable. IMPRESSION: 1. No evidence of acute intracranial abnormality on this head CT. No intracranial mass, hemorrhage or edema. No skull fracture. Atrophy and chronic ischemic changes in the white matter. Please note that brain MRI report from earlier today describes suspected punctate acute infarcts in the right middle cerebellar peduncle and right frontal periventricular white matter. 2. No fracture or acute subluxation within the cervical spine. Stable degenerative change. Atherosclerotic calcifications at each carotid bulb region. Electronically Signed   By: Bary Richard M.D.   On: 10/02/2015 19:51   Ct Cervical Spine Wo Contrast  10/02/2015  CLINICAL DATA:  Unwitnessed fall, dizziness. History of diabetes, hypertension and dementia. EXAM: CT HEAD WITHOUT CONTRAST CT CERVICAL SPINE WITHOUT CONTRAST TECHNIQUE: Multidetector CT imaging of the head and cervical spine was performed following the standard protocol without intravenous contrast. Multiplanar CT image reconstructions of the cervical spine were also generated. COMPARISON:  Brain MRI from earlier same day. Comparison also made to head  CT dated 09/23/2014 and cervical spine CT dated 09/21/2014. FINDINGS: CT HEAD FINDINGS There is mild generalized age-related brain atrophy with commensurate dilatation of the ventricles and sulci. Chronic small vessel ischemic changes noted within the periventricular white matter regions bilaterally. There is no mass, hemorrhage, edema or other evidence of acute parenchymal abnormality. No extra-axial hemorrhage. No osseous fracture or displacement. CT CERVICAL SPINE FINDINGS Degenerative changes again noted throughout the cervical spine, with associated disc space narrowings and osseous spurring at multiple levels, not significantly changed compared to the previous cervical spine CT of 09/21/2014. Associated mild disc bulges at multiple levels, but no more than mild central canal stenosis at any level. Osseous alignment is stable. No fracture line or displaced fracture fragment identified. Facet joints remain normally aligned. Atherosclerotic calcifications noted at each carotid bulb region. Paravertebral soft tissues otherwise unremarkable. IMPRESSION: 1. No evidence of acute intracranial abnormality on this head CT. No intracranial mass, hemorrhage or edema. No skull fracture. Atrophy and chronic ischemic changes in the white matter. Please note that brain MRI report from earlier today describes suspected punctate acute infarcts in the right middle cerebellar peduncle and right frontal periventricular white matter. 2. No fracture or acute subluxation within the cervical spine. Stable degenerative change. Atherosclerotic calcifications at each carotid bulb region. Electronically Signed   By: Bary Richard M.D.   On: 10/02/2015 19:51   Mr Brain Wo Contrast  10/02/2015  CLINICAL DATA:  Dizziness with standing. On witnessed fall. Initial encounter. EXAM: MRI HEAD WITHOUT CONTRAST TECHNIQUE: Multiplanar, multiecho pulse sequences of the brain and surrounding structures were obtained without intravenous contrast.  COMPARISON:  09/23/2014 FINDINGS: Some sequences are mildly to moderately motion degraded. There is a punctate, 3 mm focus of increased trace diffusion signal in the medial right middle cerebellar peduncle with suggestion of slightly reduced ADC (series 4, image 12). This is not confirmed on the coronal diffusion series, however this may be due to slice selection. There is a similar punctate 2 mm focus of abnormal diffusion signal in the right frontal lobe periventricular white matter with reduced ADC (series 4,  image 31). A 2 mm focus of abnormal trace diffusion signal in the right corona radiata (series 4, image 32) is without clearly reduced ADC and is not confirmed on coronal diffusion series. There is no evidence of intracranial hemorrhage, mass, midline shift, or extra-axial fluid collection. Patchy T2 hyperintensities throughout the subcortical and periventricular cerebral white matter bilaterally have not significantly changed from the prior MRI and are nonspecific but compatible with moderate chronic small vessel ischemic disease. Mild cerebral atrophy is unchanged. Focal subcortical T2 hyperintensity in the anterior left temporal lobe is grossly unchanged within limitations of motion artifact, presumably gliosis related to prior ischemia or trauma. Prior bilateral cataract extraction is noted. Paranasal sinuses and mastoid air cells are clear. Major intracranial vascular flow voids are grossly preserved. IMPRESSION: 1. Suspected punctate acute infarcts in the right middle cerebellar peduncle and right frontal periventricular white matter. 2. Moderate chronic small vessel ischemic disease. Electronically Signed   By: Sebastian AcheAllen  Grady M.D.   On: 10/02/2015 19:36   I have personally reviewed and evaluated these images and lab results as part of my medical decision-making.   EKG Interpretation   Date/Time:  Sunday Oct 02 2015 17:35:57 EDT Ventricular Rate:  89 PR Interval:    QRS Duration: 94 QT  Interval:  402 QTC Calculation: 489 R Axis:   80 Text Interpretation:  Atrial fibrillation Borderline repolarization  abnormality Borderline prolonged QT interval No significant change since  last tracing Confirmed by YAO  MD, DAVID ([id number]) on 10/02/2015 5:57:03 PM      MDM   Final diagnoses:  None   Lavinia SharpsMary Gretzinger is a 82 y.o. female here with fall with dizziness. Has some rotatory nystagmus on exam. Consider posterior stroke vs BPPV. Will get CT head, labs, MRI brain.   9:00 PM MRI showed cerebellar stroke. Dr. Roseanne RenoStewart will see patient. Will admit to neuro tele.   Richardean Canalavid H Yao, MD 10/02/15 2101

## 2015-10-02 NOTE — ED Notes (Signed)
Per EMS[facility] had [facility] unwitnessed fall. Pt complaining of dizziness with standing. Pt denies LOC or pain. Pt alert, but confused. Hx: CHF, a-fib.

## 2015-10-02 NOTE — Consult Note (Signed)
Admission H&P    Chief Complaint: Dizziness and abnormal MRI study.  HPI: [name] Short is an 82 y.o. female with history of diabetes mellitus, hypertension, coronary artery disease, hyperlipidemia and dementia, brought to the emergency room for evaluation of dizziness which began on the afternoon of 10/01/2015. Patient experienced a fall at the nursing home where she resides. MRI of her brain showed punctate acute infarcts involving the right middle cerebellar peduncle and right periventricular white matter. Patient has been on aspirin and Plavix and is not on anticoagulation. No focal weakness has been noted. She's had no change in speech. NIH stroke score at the time of this evaluation was 2 for mental status changes.  LSN: 3 PM on 10/01/2015 tPA Given: No: Beyond time window for treatment consideration mRankin:  Past Medical History  Diagnosis Date  . Diabetes mellitus without complication ([place])   . Hypertension   . Coronary artery disease   . Thyroid disease   . Depression   . Memory loss   . PVD (peripheral vascular disease) ([place])   . CHF (congestive heart failure) ([place])   . Atrial fibrillation ([place])   . Myocardial infarction ([place])   . Dyspnea 06/01/2012    2D Echo - EF 60-65, mitral valve calcified annulus, left atrium severely dilated, right atrium moderately dilated  . Chest pain 10/07/2009    2D Echo - EF 50-55, mitral valve calcified annulus  . CAD (coronary artery disease) 03/01/2008    Lexiscan - EF 79, no ECG changes/EKG negative for ischemia, post-stress EF 86  . Cerebral atherosclerosis 10/15/2011    Extracranial Examination - mild-moderate amount smooth mixed density plaque elevating velocities within the proximal and mid segments of the internal carotid artery  . Claudication ([place]) 09/01/2010    LEA Doppler - Bilateral ABIs-demonstrated moderate arterial insufficiency to the lower extremities at rest, Right CIA/EIA-known occlusive disease, PV Cath  . Anginal pain ([place])      not in in last month  . Dementia   . GERD (gastroesophageal reflux disease)     takes prevacid  . Arthritis     Past Surgical History  Procedure Laterality Date  . Abdominal hysterectomy    . Aorto- femoral bipass  2011  . Joint replacement Left 2010    knee  . Cardiac catheterization  2012  . Cardiac catheterization  06/02/2012    Unchanged anatomy compared to 3 years ago, continue medical therapy  . Cardiac catheterization  10/06/2009    Occluded right coronary artery, left-to-right collateral noncritical left disease with occluded grafts  . Angioplasty Left 02/23/2010    Darcon patch, 80-90% stenosis proximal to the anastomosis  . Coronary artery bypass graft  1993  . Coronary angioplasty    . Eye surgery    . Axillary-femoral bypass graft Right 10/08/2012    Procedure: BYPASS GRAFT AXILLA-BIFEMORAL;  Surgeon: Mal Misty, MD;  Location: [facility];  Service: Vascular;  Laterality: Right;  . Left heart catheterization with coronary/graft angiogram N/A 06/02/2012    Procedure: LEFT HEART CATHETERIZATION WITH Beatrix Fetters;  Surgeon: Lorretta Harp, MD;  Location: [facility] CATH LAB;  Service: Cardiovascular;  Laterality: N/A;    Family History  Problem Relation Age of Onset  . Heart disease Father   . Hypertension Father   . Heart attack Father   . Kidney failure Brother   . Hyperlipidemia Daughter    Social History:  reports that she quit smoking about 24 years ago. Her smoking use included Cigarettes. She has  never used smokeless tobacco. She reports that she does not drink alcohol or use illicit drugs.  Allergies:  Allergies  Allergen Reactions  . Codeine Itching and Nausea And Vomiting    Medications: Patient's preadmission medications were reviewed by me.  ROS: Unreliable responses due to patient's disoriented state.  Physical Examination: Blood pressure 105/65, pulse 88, resp. rate 14, SpO2 94 %.  HEENT-  Normocephalic, no lesions, without obvious  abnormality.  Normal external eye and conjunctiva.  Normal TM's bilaterally.  Normal auditory canals and external ears. Normal external nose, mucus membranes and septum.  Normal pharynx. Neck supple with no masses, nodes, nodules or enlargement. Cardiovascular - irregularly irregular rhythm, S1, S2 normal and no S3 or S4 Lungs - chest clear, no wheezing, rales, normal symmetric air entry Abdomen - soft, non-tender; bowel sounds normal; no masses,  no organomegaly Extremities - no joint deformities, effusion, or inflammation and no edema  Neurologic Examination: Mental Status: Alert, disoriented to time as well as place, no acute distress.  Patient confabulates rather freely. Speech fluent without evidence of aphasia. Able to follow commands without difficulty. Cranial Nerves: II-Visual fields were normal. III/IV/VI-Pupils were equal and reacted normally to light. Extraocular movements were full and conjugate.    V/VII-no facial numbness and no facial weakness. VIII-normal. X-normal speech and symmetrical palatal movement. XI: trapezius strength/neck flexion strength normal bilaterally XII-midline tongue extension with normal strength. Motor: 5/5 bilaterally with normal tone and bulk Sensory: Normal throughout. Deep Tendon Reflexes: Trace to 1+ and symmetric. Plantars: Mute bilaterally Cerebellar: Normal finger-to-nose testing. Carotid auscultation: Normal  Results for orders placed or performed during the hospital encounter of 10/02/15 (from the past 48 hour(s))  CBC with Differential     Status: Abnormal   Collection Time: 10/02/15  6:10 PM  Result Value Ref Range   WBC 10.5 4.0 - 10.5 K/uL   RBC 3.47 (L) 3.87 - 5.11 MIL/uL   Hemoglobin 10.1 (L) 12.0 - 15.0 g/dL   HCT 36.1 (L) 38.0 - 64.8 %   MCV 91.9 78.0 - 100.0 fL   MCH 29.1 26.0 - 34.0 pg   MCHC 31.7 30.0 - 36.0 g/dL   RDW 47.5 (H) 82.6 - 26.3 %   Platelets 209 150 - 400 K/uL   Neutrophils Relative % 76 %   Neutro Abs 8.0  (H) 1.7 - 7.7 K/uL   Lymphocytes Relative 10 %   Lymphs Abs 1.1 0.7 - 4.0 K/uL   Monocytes Relative 12 %   Monocytes Absolute 1.3 (H) 0.1 - 1.0 K/uL   Eosinophils Relative 2 %   Eosinophils Absolute 0.2 0.0 - 0.7 K/uL   Basophils Relative 0 %   Basophils Absolute 0.0 0.0 - 0.1 K/uL  Basic metabolic panel     Status: Abnormal   Collection Time: 10/02/15  6:10 PM  Result Value Ref Range   Sodium 137 135 - 145 mmol/L   Potassium 3.4 (L) 3.5 - 5.1 mmol/L   Chloride 99 (L) 101 - 111 mmol/L   CO2 29 22 - 32 mmol/L   Glucose, Bld 121 (H) 65 - 99 mg/dL   BUN 11 6 - 20 mg/dL   Creatinine, Ser 4.73 (H) 0.44 - 1.00 mg/dL   Calcium 8.9 8.9 - 07.4 mg/dL   GFR calc non Af Amer 45 (L) >60 mL/min   GFR calc Af Amer 52 (L) >60 mL/min    Comment: (NOTE) The eGFR has been calculated using the CKD EPI equation. This calculation has not been  validated in all clinical situations. eGFR's persistently <60 mL/min signify possible Chronic Kidney Disease.    Anion gap 9 5 - 15  I-stat troponin, ED     Status: None   Collection Time: 10/02/15  6:37 PM  Result Value Ref Range   Troponin i, poc 0.03 0.00 - 0.08 ng/mL   Comment 3            Comment: Due to the release kinetics of cTnI, a negative result within the first hours of the onset of symptoms does not rule out myocardial infarction with certainty. If myocardial infarction is still suspected, repeat the test at appropriate intervals.    Ct Head Wo Contrast  10/02/2015  CLINICAL DATA:  Unwitnessed fall, dizziness. History of diabetes, hypertension and dementia. EXAM: CT HEAD WITHOUT CONTRAST CT CERVICAL SPINE WITHOUT CONTRAST TECHNIQUE: Multidetector CT imaging of the head and cervical spine was performed following the standard protocol without intravenous contrast. Multiplanar CT image reconstructions of the cervical spine were also generated. COMPARISON:  Brain MRI from earlier same day. Comparison also made to head CT dated 09/23/2014 and  cervical spine CT dated 09/21/2014. FINDINGS: CT HEAD FINDINGS There is mild generalized age-related brain atrophy with commensurate dilatation of the ventricles and sulci. Chronic small vessel ischemic changes noted within the periventricular white matter regions bilaterally. There is no mass, hemorrhage, edema or other evidence of acute parenchymal abnormality. No extra-axial hemorrhage. No osseous fracture or displacement. CT CERVICAL SPINE FINDINGS Degenerative changes again noted throughout the cervical spine, with associated disc space narrowings and osseous spurring at multiple levels, not significantly changed compared to the previous cervical spine CT of 09/21/2014. Associated mild disc bulges at multiple levels, but no more than mild central canal stenosis at any level. Osseous alignment is stable. No fracture line or displaced fracture fragment identified. Facet joints remain normally aligned. Atherosclerotic calcifications noted at each carotid bulb region. Paravertebral soft tissues otherwise unremarkable. IMPRESSION: 1. No evidence of acute intracranial abnormality on this head CT. No intracranial mass, hemorrhage or edema. No skull fracture. Atrophy and chronic ischemic changes in the white matter. Please note that brain MRI report from earlier today describes suspected punctate acute infarcts in the right middle cerebellar peduncle and right frontal periventricular white matter. 2. No fracture or acute subluxation within the cervical spine. Stable degenerative change. Atherosclerotic calcifications at each carotid bulb region. Electronically Signed   By: Franki Cabot M.D.   On: 10/02/2015 19:51   Ct Cervical Spine Wo Contrast  10/02/2015  CLINICAL DATA:  Unwitnessed fall, dizziness. History of diabetes, hypertension and dementia. EXAM: CT HEAD WITHOUT CONTRAST CT CERVICAL SPINE WITHOUT CONTRAST TECHNIQUE: Multidetector CT imaging of the head and cervical spine was performed following the standard  protocol without intravenous contrast. Multiplanar CT image reconstructions of the cervical spine were also generated. COMPARISON:  Brain MRI from earlier same day. Comparison also made to head CT dated 09/23/2014 and cervical spine CT dated 09/21/2014. FINDINGS: CT HEAD FINDINGS There is mild generalized age-related brain atrophy with commensurate dilatation of the ventricles and sulci. Chronic small vessel ischemic changes noted within the periventricular white matter regions bilaterally. There is no mass, hemorrhage, edema or other evidence of acute parenchymal abnormality. No extra-axial hemorrhage. No osseous fracture or displacement. CT CERVICAL SPINE FINDINGS Degenerative changes again noted throughout the cervical spine, with associated disc space narrowings and osseous spurring at multiple levels, not significantly changed compared to the previous cervical spine CT of 09/21/2014. Associated mild disc bulges at  multiple levels, but no more than mild central canal stenosis at any level. Osseous alignment is stable. No fracture line or displaced fracture fragment identified. Facet joints remain normally aligned. Atherosclerotic calcifications noted at each carotid bulb region. Paravertebral soft tissues otherwise unremarkable. IMPRESSION: 1. No evidence of acute intracranial abnormality on this head CT. No intracranial mass, hemorrhage or edema. No skull fracture. Atrophy and chronic ischemic changes in the white matter. Please note that brain MRI report from earlier today describes suspected punctate acute infarcts in the right middle cerebellar peduncle and right frontal periventricular white matter. 2. No fracture or acute subluxation within the cervical spine. Stable degenerative change. Atherosclerotic calcifications at each carotid bulb region. Electronically Signed   By: Franki Cabot M.D.   On: 10/02/2015 19:51   Mr Brain Wo Contrast  10/02/2015  CLINICAL DATA:  Dizziness with standing. On witnessed  fall. Initial encounter. EXAM: MRI HEAD WITHOUT CONTRAST TECHNIQUE: Multiplanar, multiecho pulse sequences of the brain and surrounding structures were obtained without intravenous contrast. COMPARISON:  09/23/2014 FINDINGS: Some sequences are mildly to moderately motion degraded. There is a punctate, 3 mm focus of increased trace diffusion signal in the medial right middle cerebellar peduncle with suggestion of slightly reduced ADC (series 4, image 12). This is not confirmed on the coronal diffusion series, however this may be due to slice selection. There is a similar punctate 2 mm focus of abnormal diffusion signal in the right frontal lobe periventricular white matter with reduced ADC (series 4, image 31). A 2 mm focus of abnormal trace diffusion signal in the right corona radiata (series 4, image 32) is without clearly reduced ADC and is not confirmed on coronal diffusion series. There is no evidence of intracranial hemorrhage, mass, midline shift, or extra-axial fluid collection. Patchy T2 hyperintensities throughout the subcortical and periventricular cerebral white matter bilaterally have not significantly changed from the prior MRI and are nonspecific but compatible with moderate chronic small vessel ischemic disease. Mild cerebral atrophy is unchanged. Focal subcortical T2 hyperintensity in the anterior left temporal lobe is grossly unchanged within limitations of motion artifact, presumably gliosis related to prior ischemia or trauma. Prior bilateral cataract extraction is noted. Paranasal sinuses and mastoid air cells are clear. Major intracranial vascular flow voids are grossly preserved. IMPRESSION: 1. Suspected punctate acute infarcts in the right middle cerebellar peduncle and right frontal periventricular white matter. 2. Moderate chronic small vessel ischemic disease. Electronically Signed   By: Logan Bores M.D.   On: 10/02/2015 19:36    Assessment: 82 y.o. female with multiple risk factors for  stroke presenting with acute punctate right middle cerebellar peduncle and right periventricular ischemic infarctions, likely embolic of proximal source.  Stroke Risk Factors - atrial fibrillation, diabetes mellitus, hyperlipidemia and hypertension  Plan: 1. HgbA1c, fasting lipid panel 2. MRI, MRA  of the brain without contrast 3. PT consult, OT consult, Speech consult 4. Echocardiogram 5. Carotid dopplers 6. Prophylactic therapy-Antiplatelet med: Aspirin and Plavix 7. Risk factor modification 8. Telemetry monitoring  C.R. Nicole Kindred, MD [facility] [phone]  10/02/2015, 8:51 PM

## 2015-10-02 NOTE — H&P (Addendum)
History and Physical    [name] Short ZOX:[id number] DOB: 1927-06-03 DOA: 10/02/2015  PCP: MAZZOCCHI, Rise Mu, MD  Patient coming from: Assisted living facility.  Chief Complaint: Dizziness and fall.  HPI: [name] Short is a 82 y.o. female with medical history significant of CAD status post CABG, peripheral vascular disease status post aortofemoral bypass, hypertension, hyperlipidemia and recently worsening memory problem and was started on Aricept and had wisdom tooth removed last week on 09/29/2015 and is on amoxicillin was brought to the ER after patient had dizziness and fall. As per the patient patient had at least 3 episodes of dizziness. Denies any chest pain shortness of breath headache visual symptoms difficulty swallowing or speaking. Note that patient has been having worsening memory issues and is not a good historian. In the ER MRI brain showed infarcts involving the cerebellar peduncles and right frontal. On-call urologist Dr. Denna Haggard consulted and patient has been admitted for further management for stroke. On exam patient became hypoxic requiring 8 L of oxygen. Patient also was initially on A. fib with RVR which improved by the time I examined. On exam patient is not wheezing patient did receive some fluid bolus in the ER and also Ativan for the MRI brain.  ED Course: MRI brain showed stroke.  Review of Systems: As per HPI otherwise 10 point review of systems negative.    Past Medical History  Diagnosis Date  . Diabetes mellitus without complication (HCC)   . Hypertension   . Coronary artery disease   . Thyroid disease   . Depression   . Memory loss   . PVD (peripheral vascular disease) (HCC)   . CHF (congestive heart failure) (HCC)   . Atrial fibrillation (HCC)   . Myocardial infarction (HCC)   . Dyspnea 06/01/2012    2D Echo - EF 60-65, mitral valve calcified annulus, left atrium severely dilated, right atrium moderately dilated  . Chest pain 10/07/2009    2D Echo -  EF 50-55, mitral valve calcified annulus  . CAD (coronary artery disease) 03/01/2008    Lexiscan - EF 79, no ECG changes/EKG negative for ischemia, post-stress EF 86  . Cerebral atherosclerosis 10/15/2011    Extracranial Examination - mild-moderate amount smooth mixed density plaque elevating velocities within the proximal and mid segments of the internal carotid artery  . Claudication (HCC) 09/01/2010    LEA Doppler - Bilateral ABIs-demonstrated moderate arterial insufficiency to the lower extremities at rest, Right CIA/EIA-known occlusive disease, PV Cath  . Anginal pain (HCC)     not in in last month  . Dementia   . GERD (gastroesophageal reflux disease)     takes prevacid  . Arthritis     Past Surgical History  Procedure Laterality Date  . Abdominal hysterectomy    . Aorto- femoral bipass  2011  . Joint replacement Left 2010    knee  . Cardiac catheterization  2012  . Cardiac catheterization  06/02/2012    Unchanged anatomy compared to 3 years ago, continue medical therapy  . Cardiac catheterization  10/06/2009    Occluded right coronary artery, left-to-right collateral noncritical left disease with occluded grafts  . Angioplasty Left 02/23/2010    Darcon patch, 80-90% stenosis proximal to the anastomosis  . Coronary artery bypass graft  1993  . Coronary angioplasty    . Eye surgery    . Axillary-femoral bypass graft Right 10/08/2012    Procedure: BYPASS GRAFT AXILLA-BIFEMORAL;  Surgeon: Pryor Ochoa, MD;  Location: [facility] OR;  Service:  Vascular;  Laterality: Right;  . Left heart catheterization with coronary/graft angiogram N/A 06/02/2012    Procedure: LEFT HEART CATHETERIZATION WITH Isabel Caprice;  Surgeon: Runell Gess, MD;  Location: [facility] CATH LAB;  Service: Cardiovascular;  Laterality: N/A;     reports that she quit smoking about 24 years ago. Her smoking use included Cigarettes. She has never used smokeless tobacco. She reports that she does not drink alcohol or use  illicit drugs.  Allergies  Allergen Reactions  . Codeine Itching and Nausea And Vomiting    Family History  Problem Relation Age of Onset  . Heart disease Father   . Hypertension Father   . Heart attack Father   . Kidney failure Brother   . Hyperlipidemia Daughter     Prior to Admission medications   Medication Sig Start Date End Date Taking? Authorizing Provider  acetaminophen (TYLENOL) 325 MG tablet Take 650 mg by mouth every 4 (four) hours as needed for pain.    Yes Historical Provider, MD  albuterol (PROVENTIL HFA;VENTOLIN HFA) 108 (90 BASE) MCG/ACT inhaler Inhale 1-2 puffs into the lungs every 6 (six) hours as needed for wheezing. 12/31/12  Yes Marlon Pel, PA-C  amoxicillin (AMOXIL) 500 MG capsule Take 2,000 mg by mouth as needed (for dental procedures).    Yes Historical Provider, MD  amoxicillin (AMOXIL) 500 MG tablet Take 500 mg by mouth 4 (four) times daily.   Yes Historical Provider, MD  aspirin EC 81 MG tablet Take 81 mg by mouth daily.   Yes Historical Provider, MD  calcium-vitamin D (OSCAL WITH D) 500-200 MG-UNIT tablet Take 1 tablet by mouth 2 (two) times daily.   Yes Historical Provider, MD  cetirizine (ZYRTEC) 10 MG tablet Take 10 mg by mouth daily.   Yes Historical Provider, MD  cholecalciferol (VITAMIN D) 1000 UNITS tablet Take 1,000 Units by mouth daily.   Yes Historical Provider, MD  clopidogrel (PLAVIX) 75 MG tablet Take 75 mg by mouth daily.   Yes Historical Provider, MD  diphenhydrAMINE-zinc acetate (BENADRYL) cream Apply 1 application topically 2 (two) times daily.   Yes Historical Provider, MD  donepezil (ARICEPT) 5 MG tablet Take 10 mg by mouth at bedtime.    Yes Historical Provider, MD  famotidine (PEPCID) 20 MG tablet Take 20 mg by mouth daily.   Yes Historical Provider, MD  furosemide (LASIX) 20 MG tablet Take 20 mg by mouth daily. 10/01/15 10/04/15 Yes Historical Provider, MD  HYDROcodone-acetaminophen (NORCO/VICODIN) 5-325 MG per tablet Take 1 tablet by  mouth every 6 (six) hours as needed for pain. 10/13/12  Yes Lars Mage, PA-C  ibuprofen (ADVIL,MOTRIN) 800 MG tablet Take 800 mg by mouth every 8 (eight) hours as needed for moderate pain.    Yes Historical Provider, MD  Ipratropium-Albuterol (COMBIVENT RESPIMAT) 20-100 MCG/ACT AERS respimat Inhale 1 puff into the lungs every 6 (six) hours as needed for wheezing.   Yes Historical Provider, MD  levothyroxine (SYNTHROID, LEVOTHROID) 25 MCG tablet Take 37.5 mcg by mouth daily before breakfast.   Yes Historical Provider, MD  loperamide (IMODIUM) 2 MG capsule Take by mouth as needed for diarrhea or loose stools.   Yes Historical Provider, MD  losartan (COZAAR) 100 MG tablet Take 100 mg by mouth daily.   Yes Historical Provider, MD  metoprolol succinate (TOPROL-XL) 25 MG 24 hr tablet Take 25 mg by mouth at bedtime.   Yes Historical Provider, MD  Naproxen Sodium 220 MG CAPS Take 220 mg by mouth daily with breakfast.  All Day Relief- Gluten-Free Tab.   Yes Historical Provider, MD  nitroGLYCERIN (NITROSTAT) 0.4 MG SL tablet Place 0.4 mg under the tongue every 5 (five) minutes as needed for chest pain.    Yes Historical Provider, MD  omega-3 acid ethyl esters (LOVAZA) 1 G capsule Take 2 g by mouth daily.   Yes Historical Provider, MD  PARoxetine (PAXIL) 30 MG tablet Take 30 mg by mouth daily.   Yes Historical Provider, MD  polyvinyl alcohol (LIQUIFILM TEARS) 1.4 % ophthalmic solution Place 1 drop into both eyes 4 (four) times daily - after meals and at bedtime.   Yes Historical Provider, MD  predniSONE (DELTASONE) 20 MG tablet Take 20 mg by mouth daily with breakfast.   Yes Historical Provider, MD  ranolazine (RANEXA) 500 MG 12 hr tablet Take 1 tablet (500 mg total) by mouth 2 (two) times daily. 08/12/12  Yes Brittainy Sherlynn Carbon, PA-C  senna (SENOKOT) 8.6 MG tablet Take 1 tablet by mouth at bedtime.   Yes Historical Provider, MD  traZODone (DESYREL) 50 MG tablet Take 75 mg by mouth at bedtime.   Yes Historical  Provider, MD    Physical Exam: Filed Vitals:   10/02/15 1800 10/02/15 1930 10/02/15 2000 10/02/15 2140  BP: 126/81 105/65 136/87 115/79  Pulse: 93 88  74  Resp: 14 14  27   SpO2: 96% 94%  99%      Constitutional: Not in distress but hypoxic. Filed Vitals:   10/02/15 1800 10/02/15 1930 10/02/15 2000 10/02/15 2140  BP: 126/81 105/65 136/87 115/79  Pulse: 93 88  74  Resp: 14 14  27   SpO2: 96% 94%  99%   Eyes: Anicteric no pallor. ENMT: No discharge from the ears eyes nose and mouth. Neck: No neck rigidity no mass felt. Respiratory: No rhonchi or crepitations. Cardiovascular: S1 and S2 heard. Abdomen: Soft nontender bowel sounds present. Musculoskeletal: No edema. Skin: Has a small ecchymotic area on the left lip area. Neurologic: Alert awake oriented to her name. Moves all extremities 5 x 5. No facial asymmetry. Tongue is midline. PERRLA positive. Psychiatric: Patient has dementia and is oriented to her name.   Labs on Admission: I have personally reviewed following labs and imaging studies  CBC:  Recent Labs Lab 10/02/15 1810  WBC 10.5  NEUTROABS 8.0*  HGB 10.1*  HCT 31.9*  MCV 91.9  PLT 209   Basic Metabolic Panel:  Recent Labs Lab 10/02/15 1810  NA 137  K 3.4*  CL 99*  CO2 29  GLUCOSE 121*  BUN 11  CREATININE 1.09*  CALCIUM 8.9   GFR: CrCl cannot be calculated (Unknown ideal weight.). Liver Function Tests: No results for input(s): AST, ALT, ALKPHOS, BILITOT, PROT, ALBUMIN in the last 168 hours. No results for input(s): LIPASE, AMYLASE in the last 168 hours. No results for input(s): AMMONIA in the last 168 hours. Coagulation Profile: No results for input(s): INR, PROTIME in the last 168 hours. Cardiac Enzymes: No results for input(s): CKTOTAL, CKMB, CKMBINDEX, TROPONINI in the last 168 hours. BNP (last 3 results) No results for input(s): PROBNP in the last 8760 hours. HbA1C: No results for input(s): HGBA1C in the last 72 hours. CBG: No  results for input(s): GLUCAP in the last 168 hours. Lipid Profile: No results for input(s): CHOL, HDL, LDLCALC, TRIG, CHOLHDL, LDLDIRECT in the last 72 hours. Thyroid Function Tests: No results for input(s): TSH, T4TOTAL, FREET4, T3FREE, THYROIDAB in the last 72 hours. Anemia Panel: No results for input(s): VITAMINB12, FOLATE, FERRITIN,  TIBC, IRON, RETICCTPCT in the last 72 hours. Urine analysis:    Component Value Date/Time   COLORURINE YELLOW 02/25/2014 1842   APPEARANCEUR CLEAR 02/25/2014 1842   LABSPEC 1.014 02/25/2014 1842   PHURINE 6.0 02/25/2014 1842   GLUCOSEU NEGATIVE 02/25/2014 1842   HGBUR NEGATIVE 02/25/2014 1842   BILIRUBINUR NEGATIVE 02/25/2014 1842   KETONESUR NEGATIVE 02/25/2014 1842   PROTEINUR NEGATIVE 02/25/2014 1842   UROBILINOGEN 0.2 02/25/2014 1842   NITRITE NEGATIVE 02/25/2014 1842   LEUKOCYTESUR SMALL* 02/25/2014 1842   Sepsis Labs: @LABRCNTIP (procalcitonin:4,lacticidven:4) )No results found for this or any previous visit (from the past 240 hour(s)).   Radiological Exams on Admission: Ct Head Wo Contrast  10/02/2015  CLINICAL DATA:  Unwitnessed fall, dizziness. History of diabetes, hypertension and dementia. EXAM: CT HEAD WITHOUT CONTRAST CT CERVICAL SPINE WITHOUT CONTRAST TECHNIQUE: Multidetector CT imaging of the head and cervical spine was performed following the standard protocol without intravenous contrast. Multiplanar CT image reconstructions of the cervical spine were also generated. COMPARISON:  Brain MRI from earlier same day. Comparison also made to head CT dated 09/23/2014 and cervical spine CT dated 09/21/2014. FINDINGS: CT HEAD FINDINGS There is mild generalized age-related brain atrophy with commensurate dilatation of the ventricles and sulci. Chronic small vessel ischemic changes noted within the periventricular white matter regions bilaterally. There is no mass, hemorrhage, edema or other evidence of acute parenchymal abnormality. No extra-axial  hemorrhage. No osseous fracture or displacement. CT CERVICAL SPINE FINDINGS Degenerative changes again noted throughout the cervical spine, with associated disc space narrowings and osseous spurring at multiple levels, not significantly changed compared to the previous cervical spine CT of 09/21/2014. Associated mild disc bulges at multiple levels, but no more than mild central canal stenosis at any level. Osseous alignment is stable. No fracture line or displaced fracture fragment identified. Facet joints remain normally aligned. Atherosclerotic calcifications noted at each carotid bulb region. Paravertebral soft tissues otherwise unremarkable. IMPRESSION: 1. No evidence of acute intracranial abnormality on this head CT. No intracranial mass, hemorrhage or edema. No skull fracture. Atrophy and chronic ischemic changes in the white matter. Please note that brain MRI report from earlier today describes suspected punctate acute infarcts in the right middle cerebellar peduncle and right frontal periventricular white matter. 2. No fracture or acute subluxation within the cervical spine. Stable degenerative change. Atherosclerotic calcifications at each carotid bulb region. Electronically Signed   By: Bary RichardStan  Maynard M.D.   On: 10/02/2015 19:51   Ct Cervical Spine Wo Contrast  10/02/2015  CLINICAL DATA:  Unwitnessed fall, dizziness. History of diabetes, hypertension and dementia. EXAM: CT HEAD WITHOUT CONTRAST CT CERVICAL SPINE WITHOUT CONTRAST TECHNIQUE: Multidetector CT imaging of the head and cervical spine was performed following the standard protocol without intravenous contrast. Multiplanar CT image reconstructions of the cervical spine were also generated. COMPARISON:  Brain MRI from earlier same day. Comparison also made to head CT dated 09/23/2014 and cervical spine CT dated 09/21/2014. FINDINGS: CT HEAD FINDINGS There is mild generalized age-related brain atrophy with commensurate dilatation of the ventricles  and sulci. Chronic small vessel ischemic changes noted within the periventricular white matter regions bilaterally. There is no mass, hemorrhage, edema or other evidence of acute parenchymal abnormality. No extra-axial hemorrhage. No osseous fracture or displacement. CT CERVICAL SPINE FINDINGS Degenerative changes again noted throughout the cervical spine, with associated disc space narrowings and osseous spurring at multiple levels, not significantly changed compared to the previous cervical spine CT of 09/21/2014. Associated mild disc bulges at multiple  levels, but no more than mild central canal stenosis at any level. Osseous alignment is stable. No fracture line or displaced fracture fragment identified. Facet joints remain normally aligned. Atherosclerotic calcifications noted at each carotid bulb region. Paravertebral soft tissues otherwise unremarkable. IMPRESSION: 1. No evidence of acute intracranial abnormality on this head CT. No intracranial mass, hemorrhage or edema. No skull fracture. Atrophy and chronic ischemic changes in the white matter. Please note that brain MRI report from earlier today describes suspected punctate acute infarcts in the right middle cerebellar peduncle and right frontal periventricular white matter. 2. No fracture or acute subluxation within the cervical spine. Stable degenerative change. Atherosclerotic calcifications at each carotid bulb region. Electronically Signed   By: Bary Richard M.D.   On: 10/02/2015 19:51   Mr Brain Wo Contrast  10/02/2015  CLINICAL DATA:  Dizziness with standing. On witnessed fall. Initial encounter. EXAM: MRI HEAD WITHOUT CONTRAST TECHNIQUE: Multiplanar, multiecho pulse sequences of the brain and surrounding structures were obtained without intravenous contrast. COMPARISON:  09/23/2014 FINDINGS: Some sequences are mildly to moderately motion degraded. There is a punctate, 3 mm focus of increased trace diffusion signal in the medial right middle  cerebellar peduncle with suggestion of slightly reduced ADC (series 4, image 12). This is not confirmed on the coronal diffusion series, however this may be due to slice selection. There is a similar punctate 2 mm focus of abnormal diffusion signal in the right frontal lobe periventricular white matter with reduced ADC (series 4, image 31). A 2 mm focus of abnormal trace diffusion signal in the right corona radiata (series 4, image 32) is without clearly reduced ADC and is not confirmed on coronal diffusion series. There is no evidence of intracranial hemorrhage, mass, midline shift, or extra-axial fluid collection. Patchy T2 hyperintensities throughout the subcortical and periventricular cerebral white matter bilaterally have not significantly changed from the prior MRI and are nonspecific but compatible with moderate chronic small vessel ischemic disease. Mild cerebral atrophy is unchanged. Focal subcortical T2 hyperintensity in the anterior left temporal lobe is grossly unchanged within limitations of motion artifact, presumably gliosis related to prior ischemia or trauma. Prior bilateral cataract extraction is noted. Paranasal sinuses and mastoid air cells are clear. Major intracranial vascular flow voids are grossly preserved. IMPRESSION: 1. Suspected punctate acute infarcts in the right middle cerebellar peduncle and right frontal periventricular white matter. 2. Moderate chronic small vessel ischemic disease. Electronically Signed   By: Sebastian Ache M.D.   On: 10/02/2015 19:36    EKG: Independently reviewed. A. fib with RVR.  Assessment/Plan Principal Problem:   Acute embolic stroke [facility]) Active Problems:   CAD (coronary artery disease) of artery bypass graft   Essential hypertension   Stroke [facility])   Stroke (cerebrum) (HCC)   Acute respiratory failure with hypoxia (HCC)    #1. Stroke - risk factors include atrial fibrillation. Patient is not on anticoagulation probably from risk of fall.  Patient is on aspirin and Plavix which the neurologist at this time is requested to continue. Check MRI brain 2-D echo carotid Doppler swallow evaluation neuro checks hemoglobin A1c lipid panel physical therapy consult.  #2. Acute hypoxic respiratory failure - patient on exam is nonrevealing and is not tachypneic but patient is hypoxic. I have ordered stat chest x-ray ABG BNP and d-dimer. I have ordered 1 dose of Lasix 10 mg IV. Further plans based on test order and clinical course. #3. A. fib with RVR - patient's rate is improved by the time I  examined. Chads 2 vasc score is 5. Patient is not on anticoagulation probably secondary to risk of falls. Patient on Plavix and aspirin. Continue metoprolol. Check cardiac markers and TSH. Check 2-D echo. #4. Hypertension on Cozaar and metoprolol. #5. CAD status post CABG -  denies any chest pain. Since patient was in A. fib with RVR we will check cardiac markers.  #6. Peripheral vascular disease status post aorta femoral bypass  - no acute issues.  #7. Recent wisdom tooth removal on amoxicillin. As per patient's daughter patient had bleeding during the procedure so closely follow CBC for any further worsening of bleeding. #8. Recent worsening of memory issues and was started on Aricept.   DVT prophylaxis: Lovenox. Code Status: Full code.  Family Communication: Patient's daughter.  Disposition Plan: Probably skilled nursing facility.  Consults called: Neurology.  Admission status: Inpatient. Telemetry. Likely stay 2-3 days.    Eduard Clos MD [facility] Pager [phone].  If 7PM-7AM, please contact night-coverage [email or web address] Password [facility]  10/02/2015, 10:24 PM

## 2015-10-03 ENCOUNTER — Encounter (HOSPITAL_COMMUNITY): Payer: Self-pay | Admitting: General Practice

## 2015-10-03 ENCOUNTER — Observation Stay (HOSPITAL_COMMUNITY): Payer: Medicare Other

## 2015-10-03 ENCOUNTER — Observation Stay (HOSPITAL_BASED_OUTPATIENT_CLINIC_OR_DEPARTMENT_OTHER): Payer: Medicare Other

## 2015-10-03 DIAGNOSIS — Z885 Allergy status to narcotic agent status: Secondary | ICD-10-CM | POA: Diagnosis not present

## 2015-10-03 DIAGNOSIS — I480 Paroxysmal atrial fibrillation: Secondary | ICD-10-CM | POA: Insufficient documentation

## 2015-10-03 DIAGNOSIS — M1990 Unspecified osteoarthritis, unspecified site: Secondary | ICD-10-CM | POA: Diagnosis present

## 2015-10-03 DIAGNOSIS — I2781 Cor pulmonale (chronic): Secondary | ICD-10-CM | POA: Diagnosis present

## 2015-10-03 DIAGNOSIS — K219 Gastro-esophageal reflux disease without esophagitis: Secondary | ICD-10-CM | POA: Diagnosis present

## 2015-10-03 DIAGNOSIS — Z87891 Personal history of nicotine dependence: Secondary | ICD-10-CM | POA: Diagnosis not present

## 2015-10-03 DIAGNOSIS — J9601 Acute respiratory failure with hypoxia: Secondary | ICD-10-CM | POA: Diagnosis present

## 2015-10-03 DIAGNOSIS — Z79899 Other long term (current) drug therapy: Secondary | ICD-10-CM | POA: Diagnosis not present

## 2015-10-03 DIAGNOSIS — E876 Hypokalemia: Secondary | ICD-10-CM | POA: Diagnosis not present

## 2015-10-03 DIAGNOSIS — I6350 Cerebral infarction due to unspecified occlusion or stenosis of unspecified cerebral artery: Secondary | ICD-10-CM

## 2015-10-03 DIAGNOSIS — I639 Cerebral infarction, unspecified: Secondary | ICD-10-CM

## 2015-10-03 DIAGNOSIS — Z7982 Long term (current) use of aspirin: Secondary | ICD-10-CM | POA: Diagnosis not present

## 2015-10-03 DIAGNOSIS — Z96652 Presence of left artificial knee joint: Secondary | ICD-10-CM | POA: Diagnosis present

## 2015-10-03 DIAGNOSIS — Z7952 Long term (current) use of systemic steroids: Secondary | ICD-10-CM | POA: Diagnosis not present

## 2015-10-03 DIAGNOSIS — Z9181 History of falling: Secondary | ICD-10-CM | POA: Diagnosis not present

## 2015-10-03 DIAGNOSIS — G459 Transient cerebral ischemic attack, unspecified: Secondary | ICD-10-CM | POA: Diagnosis not present

## 2015-10-03 DIAGNOSIS — Z951 Presence of aortocoronary bypass graft: Secondary | ICD-10-CM | POA: Diagnosis not present

## 2015-10-03 DIAGNOSIS — F0390 Unspecified dementia without behavioral disturbance: Secondary | ICD-10-CM | POA: Diagnosis present

## 2015-10-03 DIAGNOSIS — I509 Heart failure, unspecified: Secondary | ICD-10-CM | POA: Diagnosis present

## 2015-10-03 DIAGNOSIS — R29702 NIHSS score 2: Secondary | ICD-10-CM | POA: Diagnosis present

## 2015-10-03 DIAGNOSIS — Z66 Do not resuscitate: Secondary | ICD-10-CM | POA: Diagnosis present

## 2015-10-03 DIAGNOSIS — I25810 Atherosclerosis of coronary artery bypass graft(s) without angina pectoris: Secondary | ICD-10-CM | POA: Diagnosis present

## 2015-10-03 DIAGNOSIS — W19XXXA Unspecified fall, initial encounter: Secondary | ICD-10-CM | POA: Diagnosis present

## 2015-10-03 DIAGNOSIS — I252 Old myocardial infarction: Secondary | ICD-10-CM | POA: Diagnosis not present

## 2015-10-03 DIAGNOSIS — I110 Hypertensive heart disease with heart failure: Secondary | ICD-10-CM | POA: Diagnosis present

## 2015-10-03 DIAGNOSIS — I6340 Cerebral infarction due to embolism of unspecified cerebral artery: Secondary | ICD-10-CM | POA: Diagnosis present

## 2015-10-03 DIAGNOSIS — I739 Peripheral vascular disease, unspecified: Secondary | ICD-10-CM | POA: Diagnosis present

## 2015-10-03 DIAGNOSIS — E119 Type 2 diabetes mellitus without complications: Secondary | ICD-10-CM | POA: Diagnosis present

## 2015-10-03 DIAGNOSIS — I340 Nonrheumatic mitral (valve) insufficiency: Secondary | ICD-10-CM | POA: Diagnosis present

## 2015-10-03 DIAGNOSIS — I4891 Unspecified atrial fibrillation: Secondary | ICD-10-CM | POA: Diagnosis present

## 2015-10-03 DIAGNOSIS — E785 Hyperlipidemia, unspecified: Secondary | ICD-10-CM | POA: Insufficient documentation

## 2015-10-03 DIAGNOSIS — Z7902 Long term (current) use of antithrombotics/antiplatelets: Secondary | ICD-10-CM | POA: Diagnosis not present

## 2015-10-03 DIAGNOSIS — Z90710 Acquired absence of both cervix and uterus: Secondary | ICD-10-CM | POA: Diagnosis not present

## 2015-10-03 LAB — LIPID PANEL
CHOLESTEROL: 149 mg/dL (ref 0–200)
HDL: 26 mg/dL — ABNORMAL LOW (ref >40)
LDL Cholesterol: 101 mg/dL — ABNORMAL HIGH (ref 0–99)
Total CHOL/HDL Ratio: 5.7 RATIO
Triglycerides: 111 mg/dL (ref <150)
VLDL: 22 mg/dL (ref 0–40)

## 2015-10-03 LAB — TROPONIN I
TROPONIN I: 0.03 ng/mL (ref <0.031)
TROPONIN I: 0.03 ng/mL (ref <0.031)

## 2015-10-03 LAB — TSH: TSH: 1.239 u[IU]/mL (ref 0.350–4.500)

## 2015-10-03 LAB — MRSA PCR SCREENING: MRSA by PCR: POSITIVE — AB

## 2015-10-03 LAB — ECHOCARDIOGRAM COMPLETE

## 2015-10-03 MED ORDER — CETYLPYRIDINIUM CHLORIDE 0.05 % MT LIQD
7.0000 mL | Freq: Two times a day (BID) | OROMUCOSAL | Status: DC
  Administered 2015-10-03 – 2015-10-05 (×5): 7 mL via OROMUCOSAL

## 2015-10-03 MED ORDER — IOPAMIDOL (ISOVUE-370) INJECTION 76%
INTRAVENOUS | Status: AC
  Administered 2015-10-03: 100 mL
  Filled 2015-10-03: qty 100

## 2015-10-03 MED ORDER — ENSURE ENLIVE PO LIQD
237.0000 mL | Freq: Two times a day (BID) | ORAL | Status: DC
  Administered 2015-10-03 – 2015-10-05 (×5): 237 mL via ORAL

## 2015-10-03 MED ORDER — AMOXICILLIN 500 MG PO CAPS
500.0000 mg | ORAL_CAPSULE | Freq: Three times a day (TID) | ORAL | Status: DC
  Administered 2015-10-03 – 2015-10-05 (×8): 500 mg via ORAL
  Filled 2015-10-03 (×9): qty 1

## 2015-10-03 MED ORDER — SIMVASTATIN 10 MG PO TABS
10.0000 mg | ORAL_TABLET | Freq: Every day | ORAL | Status: DC
  Administered 2015-10-03 – 2015-10-04 (×2): 10 mg via ORAL
  Filled 2015-10-03 (×2): qty 1

## 2015-10-03 NOTE — Evaluation (Signed)
Occupational Therapy Evaluation Patient Details Name: [name] Short MRN: [id number] DOB: 1927-10-26 Today's Date: 10/03/2015    History of Present Illness [name] Short is an 82 y.o. female with history of diabetes mellitus, hypertension, coronary artery disease, hyperlipidemia, dementia, CHF, Afib, dyspnea, thyroid disease, depression, PVD, myocardial infarction, GERD, arthritis, CABG, brought to the emergency room for evaluation of dizziness which began on the afternoon of 10/01/2015. Patient experienced a fall at the nursing home where she resides. MRI of her brain showed suspected punctate acute infarcts involving the right middle cerebellar peduncle and right periventricular white matter.   Clinical Impression   Pt admitted with above. Unsure of pt's true PLOF. Feel pt will benefit from acute OT to increase independence prior to d/c. Recommending SNF and 24/7 supervision/assist upon d/c.     Follow Up Recommendations  SNF;Supervision/Assistance - 24 hour (plan is to transition to memory care)    Equipment Recommendations  Other (comment) (defer to next venue)    Recommendations for Other Services       Precautions / Restrictions Precautions Precautions: Fall Restrictions Weight Bearing Restrictions: No      Mobility Bed Mobility General bed mobility comments: not assessed  Transfers Overall transfer level: Needs assistance Equipment used: Rolling walker (2 wheeled) Transfers: Sit to/from Stand Sit to Stand: Min assist         General transfer comment: assist to boost to stand.    Balance Assist for balance for taking steps without RW.                        ADL Overall ADL's : Needs assistance/impaired                     Lower Body Dressing: Minimal assistance;Sit to/from stand   Toilet Transfer: Minimal assistance;Ambulation;RW (Min A-sit to stand from chair)           Functional mobility during ADLs: Rolling walker (Min guard walking with  RW; Min A without RW; Wellsite geologist)       Vision     Perception     Praxis      Pertinent Vitals/Pain Pain Assessment: Faces Faces Pain Scale: Hurts little more Pain Location: head Pain Descriptors / Indicators: Headache Pain Intervention(s): Monitored during session     Hand Dominance     Extremity/Trunk Assessment Upper Extremity Assessment Upper Extremity Assessment: RUE deficits/detail;LUE deficits/detail RUE Deficits / Details: decreased AROM shoulder flexion-weakness in shoulder flexors LUE Deficits / Details: weakness in shoulder flexors   Lower Extremity Assessment Lower Extremity Assessment: Defer to PT evaluation     Communication Communication Communication: No difficulties   Cognition Arousal/Alertness: Awake/alert Behavior During Therapy: WFL for tasks assessed/performed Overall Cognitive Status: History of cognitive impairments - at baseline (daughter reports it has been worse last 3 weeks)   General Comments       Exercises       Shoulder Instructions      Home Living Family/patient expects to be discharged to:: Skilled nursing facility                             Home Equipment: Walker - 2 wheels          Prior Functioning/Environment Level of Independence: Needs assistance  Gait / Transfers Assistance Needed: Per PT eval, Walks to and from dining hall with RW (likely a rollator) ADL's / Homemaking Assistance Needed: per PT eval,  reports she gets staff assist with bathing; daughter reports she has been wearing robe.        OT Diagnosis: Generalized weakness   OT Problem List: Decreased strength;Decreased range of motion;Increased edema;Decreased knowledge of precautions;Decreased knowledge of use of DME or AE;Decreased cognition;Decreased safety awareness;Impaired balance (sitting and/or standing);Decreased activity tolerance;Pain   OT Treatment/Interventions: Self-care/ADL training;Therapeutic exercise;DME and/or AE  instruction;Therapeutic activities;Patient/family education;Balance training;Energy conservation;Cognitive remediation/compensation    OT Goals(Current goals can be found in the care plan section) Acute Rehab OT Goals Patient Stated Goal: not stated OT Goal Formulation: With family Time For Goal Achievement: 10/17/15 Potential to Achieve Goals: Good ADL Goals Pt Will Perform Grooming: with set-up;with supervision;standing (3 tasks) Pt Will Perform Lower Body Dressing: sit to/from stand;with set-up;with supervision Pt Will Transfer to Toilet: with set-up;with supervision;ambulating;bedside commode Pt Will Perform Toileting - Clothing Manipulation and hygiene: sit to/from stand;with supervision;with set-up  OT Frequency: Min 2X/week   Barriers to D/C:            Co-evaluation              End of Session Equipment Utilized During Treatment: Gait belt;Rolling walker;Other (comment) ([employer] used part of session) Nurse Communication: Mobility status;Other (comment) (d/c recommendation)  Activity Tolerance: Other (comment) (feeling funny) Patient left: in chair;with family/visitor present;with chair alarm set;Other (comment);with call bell/phone within reach (with nurse tech)   Time: [id number] OT Time Calculation (min): 14 min Charges:  OT General Charges $OT Visit: 1 Procedure OT Evaluation $OT Eval Moderate Complexity: 1 Procedure G-Codes: OT G-codes **NOT FOR INPATIENT CLASS** Functional Assessment Tool Used: clinical judgment Functional Limitation: Self care Self Care Current Status (V4098(G8987): At least 20 percent but less than 40 percent impaired, limited or restricted Self Care Goal Status (J1914(G8988): At least 1 percent but less than 20 percent impaired, limited or restricted  Earlie RavelingStraub, Michol Emory L OTR/L [phone] 10/03/2015, 11:41 AM

## 2015-10-03 NOTE — Progress Notes (Addendum)
STROKE TEAM PROGRESS NOTE   HISTORY OF PRESENT ILLNESS (per record) [name] Short is an 82 y.o. female with history of diabetes mellitus, hypertension, coronary artery disease, hyperlipidemia and dementia, brought to the emergency room for evaluation of dizziness which began on the afternoon of 10/01/2015 (LKW 3 PM on 10/01/2015). Patient experienced a fall at the nursing home where she resides. MRI of her brain showed punctate acute infarcts involving the right middle cerebellar peduncle and right periventricular white matter. Patient has been on aspirin and Plavix and is not on anticoagulation. No focal weakness has been noted. She's had no change in speech. NIH stroke score at the time of this evaluation was 2 for mental status changes. Patient was not administered IV t-PA secondary to being beyond time window. She was admitted for further evaluation and treatment.   SUBJECTIVE (INTERVAL HISTORY) Her daughters and grankids are at the bedside.  Overall she feels her condition is gradually improving. She has dementia and is NH resident. She can not remember details, but stated that she had dizziness for 15 min and now resolved. She fell at NH due to dizziness.    OBJECTIVE Temp:  [98.5 F (36.9 C)] 98.5 F (36.9 C) (05/22 0602) Pulse Rate:  [74-109] 109 (05/22 0602) Cardiac Rhythm:  [-] Atrial fibrillation (05/22 0700) Resp:  [14-27] 16 (05/22 0602) BP: (105-174)/(65-93) 174/93 mmHg (05/22 0602) SpO2:  [94 %-100 %] 100 % (05/22 0602)  CBC:  Recent Labs Lab 10/02/15 1810  WBC 10.5  NEUTROABS 8.0*  HGB 10.1*  HCT 31.9*  MCV 91.9  PLT 209    Basic Metabolic Panel:  Recent Labs Lab 10/02/15 1810  NA 137  K 3.4*  CL 99*  CO2 29  GLUCOSE 121*  BUN 11  CREATININE 1.09*  CALCIUM 8.9    Lipid Panel:    Component Value Date/Time   CHOL 149 10/03/2015 0546   TRIG 111 10/03/2015 0546   HDL 26* 10/03/2015 0546   CHOLHDL 5.7 10/03/2015 0546   VLDL 22 10/03/2015 0546   LDLCALC 101*  10/03/2015 0546   HgbA1c:  Lab Results  Component Value Date   HGBA1C 5.4 10/08/2012   Urine Drug Screen: No results found for: LABOPIA, COCAINSCRNUR, LABBENZ, AMPHETMU, THCU, LABBARB    IMAGING I have personally reviewed the radiological images below and agree with the radiology interpretations.  Ct Head Wo Contrast 10/02/2015  1. No evidence of acute intracranial abnormality on this head CT. No intracranial mass, hemorrhage or edema. No skull fracture. Atrophy and chronic ischemic changes in the white matter.   Ct Angio Chest Pe W/cm &/or Wo Cm 10/03/2015  1. No pulmonary embolus. 2. Findings consistent with right heart failure, at least moderate in degree. 3. Shotty mediastinal and right hilar adenopathy is likely reactive.  Ct Cervical Spine Wo Contrast 10/02/2015   No fracture or acute subluxation within the cervical spine. Stable degenerative change. Atherosclerotic calcifications at each carotid bulb region.   Mr Brain Wo Contrast 10/02/2015   1. Suspected punctate acute infarcts in the right middle cerebellar peduncle and right frontal periventricular white matter. 2. Moderate chronic small vessel ischemic disease.    MRA brain 1. No visible flow in the non dominant proximal right V4 segment with normalization by the bibasilar. 2. No flow limiting stenosis or major branch occlusion seen in the anterior circulation.  CUS - Bilateral: 1-39% ICA stenosis. Vertebral artery flow is antegrade.  TTE - Left ventricle: The cavity size was normal. Wall thickness was  normal. Systolic function was normal. The estimated ejection  fraction was in the range of 55% to 60%. - Aortic valve: There was trivial regurgitation. - Mitral valve: There was moderate to severe regurgitation. - Left atrium: The atrium was severely dilated. - Right atrium: The atrium was severely dilated. - Tricuspid valve: There was moderate regurgitation. - Pulmonary arteries: PA peak pressure: 52 mm Hg  (S).   PHYSICAL EXAM  Temp:  [97.5 F (36.4 C)-98.8 F (37.1 C)] 98.2 F (36.8 C) (05/22 2022) Pulse Rate:  [89-136] 110 (05/22 2033) Resp:  [16-20] 20 (05/22 2022) BP: (146-175)/(48-99) 146/78 mmHg (05/22 2022) SpO2:  [89 %-100 %] 89 % (05/22 2022)  General - Well nourished, well developed, in no apparent distress.  Ophthalmologic - Fundi not visualized due to not cooperative.  Cardiovascular - irregularly irregular heart rate and rhythm.  Mental Status -  Level of arousal and orientation to place, and person were intact, but not to time. Language including expression, naming, repetition, comprehension was assessed and found intact. Fund of Knowledge was assessed and was impaired.  Cranial Nerves II - XII - II - Visual field intact OU. III, IV, VI - Extraocular movements intact V - Facial sensation intact bilaterally. VII - Facial movement intact bilaterally. VIII - Hearing & vestibular intact bilaterally. X - Palate elevates symmetrically. XI - Chin turning & shoulder shrug intact bilaterally. XII - Tongue protrusion intact.  Motor Strength - The patient's strength was normal in all extremities and pronator drift was absent.  Bulk was normal and fasciculations were absent.   Motor Tone - Muscle tone was assessed at the neck and appendages and was normal.  Reflexes - The patient's reflexes were 1+ in all extremities and she had no pathological reflexes.  Sensory - Light touch, temperature/pinprick were assessed and were symmetrical.    Coordination - The patient had normal movements in the hands with no ataxia or dysmetria.  Tremor was absent.  Gait and Station - not tested due to safety concerns   ASSESSMENT/PLAN [name] is a 82 y.o. female with history of diabetes mellitus, hypertension, coronary artery disease, hyperlipidemia and dementia presenting with dizziness. She did not receive IV t-PA due to beyond time window.   Stroke:  puctate right middle  cerebellar peduncle and right periventricular white matter infarcts, embolic secondary to known atrial fibrillation not on anticoagulation  MRI  right middle cerebellar peduncle and right frontal periventricular white matter infarcts. small vessel disease   MRA  unremarkable  Carotid Doppler  unremarkable  2D Echo  EF 55-60%  LDL 101  HgbA1c pending  Lovenox 40 mg sq daily for VTE prophylaxis  aspirin 81 mg daily and clopidogrel 75 mg daily prior to admission, now on aspirin 81 mg daily and clopidogrel 75 mg daily. Recommend anticoagulation for stroke prevention with eliquis 5mg  bid. Due to hx of CAD/MI and PVD, also recommend ASA 81mg  together with eliquis.   Ongoing aggressive stroke risk factor management  Therapy recommendations:  pending  Disposition:  pending (SNF PTA)  Atrial Fibrillation w/ RVR  Home anticoagulation:  None due to fall risk (per note from [place], [place] 06/03/12).  On aspirin 81 mg daily and clopidogrel 75 mg daily which was continued in the hospital.  Recommend anticoagulation for stroke prevention with eliquis 5mg  bid. Due to hx of CAD/MI and PVD, also recommend ASA 81mg  together with eliquis.    Hypertension  Stable  Permissive hypertension (OK if < 220/120) but gradually normalize  in 5-7 days  Hyperlipidemia  Home meds:  Lovaza, no statin. Has been on statin in the past  LDL 101, goal < 70  On simvastatin   Continue statin at discharge  Diabetes  HgbA1c pending, goal < 7.0  CBG under control  Other Stroke Risk Factors  Advanced age  Former Cigarette smoker  ETOH use  Coronary artery disease - MI s/p CABG 1993  PVD s/p ax-fem/fem-fem BPG  Other Active Problems  Baseline memory loss with recent worsening, on aricept  Acute hypoxic respiratory failure  CHF  Recent wisdom tooth removed on abx  Hospital day # 1  Neurology will sign off. Please call with questions. Pt will follow up with Darrol Angel at [facility] in  about 2 months. Thanks for the consult.  Marvel Plan, MD PhD Stroke Neurology 10/03/2015 11:38 PM     To contact Stroke Continuity provider, please refer to [email or web address]. After hours, contact General Neurology

## 2015-10-03 NOTE — Progress Notes (Signed)
VASCULAR LAB PRELIMINARY  PRELIMINARY  PRELIMINARY  PRELIMINARY  Carotid duplex completed.    Preliminary report:  1-39% ICA stenosis.  Vertebral artery flow is antegrade.   Lucette Kratz, RVT 10/03/2015, 2:25 PM

## 2015-10-03 NOTE — Progress Notes (Signed)
  Echocardiogram 2D Echocardiogram has been performed.  [name] Short 10/03/2015, 1:38 PM

## 2015-10-03 NOTE — Evaluation (Signed)
Physical Therapy Evaluation Patient Details Name: [name] Short MRN: [id number] DOB: February 11, 1928 Today's Date: 10/03/2015   History of Present Illness  [name] Short is an 82 y.o. female with history of diabetes mellitus, hypertension, coronary artery disease, hyperlipidemia and dementia, brought to the emergency room for evaluation of dizziness which began on the afternoon of 10/01/2015. Patient experienced a fall at the nursing home where she resides. MRI of her brain showed punctate acute infarcts involving the right middle cerebellar peduncle and right periventricular white matter.  Clinical Impression   Pt admitted with above diagnosis. Pt currently with functional limitations due to the deficits listed below (see PT Problem List).  Pt will benefit from skilled PT to increase their independence and safety with mobility to allow discharge to the venue listed below.       Follow Up Recommendations SNF;Supervision/Assistance - 24 hour  For post-acute rehab to maximize independence and safety with mobility prior to return to ALF    Equipment Recommendations  3in1 (PT)    Recommendations for Other Services       Precautions / Restrictions Precautions Precautions: Fall      Mobility  Bed Mobility Overal bed mobility: Needs Assistance Bed Mobility: Supine to Sit     Supine to sit: Min guard     General bed mobility comments: [place] for safety  Transfers Overall transfer level: Needs assistance Equipment used: Rolling walker (2 wheeled) Transfers: Sit to/from Stand Sit to Stand: Mod assist         General transfer comment: Mod assist to steady; tending to lean posteriorly, bracing backs of LEs against the bed  Ambulation/Gait Ambulation/Gait assistance: Min assist;Mod assist Ambulation Distance (Feet): 20 Feet Assistive device: Rolling walker (2 wheeled) Gait Pattern/deviations: Shuffle     General Gait Details: min assist overall, with 2 occasions of mod assist due to  loss of balance posteriorly  Stairs            Wheelchair Mobility    Modified Rankin (Stroke Patients Only) Modified Rankin (Stroke Patients Only) Pre-Morbid Rankin Score: Slight disability Modified Rankin: Moderately severe disability     Balance Overall balance assessment: Needs assistance Sitting-balance support: Feet supported Sitting balance-Leahy Scale: Good       Standing balance-Leahy Scale: Fair Standing balance comment: tending to lean posteriorly                             Pertinent Vitals/Pain Pain Assessment: No/denies pain    Home Living Family/patient expects to be discharged to:: Assisted living               Home Equipment: Walker - 2 wheels      Prior Function Level of Independence: Needs assistance   Gait / Transfers Assistance Needed: Walks to and from dining hall with RW (likely a rollator)  ADL's / Homemaking Assistance Needed: reports she gets staff assist with bathing        Hand Dominance        Extremity/Trunk Assessment   Upper Extremity Assessment: Generalized weakness           Lower Extremity Assessment: Generalized weakness (no focal deficits in strength noted)      Cervical / Trunk Assessment: Kyphotic  Communication   Communication: No difficulties  Cognition Arousal/Alertness: Awake/alert Behavior During Therapy: WFL for tasks assessed/performed Overall Cognitive Status: Impaired/Different from baseline Area of Impairment: Memory  General Comments: Confabulates freely    General Comments      Exercises        Assessment/Plan    PT Assessment Patient needs continued PT services  PT Diagnosis Difficulty walking   PT Problem List Decreased strength;Decreased range of motion;Decreased activity tolerance;Decreased balance;Decreased mobility;Decreased coordination;Decreased cognition;Decreased knowledge of use of DME;Decreased safety awareness  PT Treatment  Interventions DME instruction;Gait training;Stair training;Functional mobility training;Therapeutic activities;Therapeutic exercise;Balance training;Neuromuscular re-education;Cognitive remediation   PT Goals (Current goals can be found in the Care Plan section) Acute Rehab PT Goals Patient Stated Goal: did not state PT Goal Formulation: With patient Time For Goal Achievement: 10/17/15 Potential to Achieve Goals: Good    Frequency Min 4X/week   Barriers to discharge        Co-evaluation               End of Session Equipment Utilized During Treatment: Gait belt Activity Tolerance: Patient tolerated treatment well Patient left: in chair;with call bell/phone within reach;with chair alarm set Nurse Communication: Mobility status    Functional Assessment Tool Used: clinical Judgement Functional Limitation: Mobility: Walking and moving around Mobility: Walking and Moving Around Current Status [phone](G8978): At least 20 percent but less than 40 percent impaired, limited or restricted Mobility: Walking and Moving Around Goal Status [phone](G8979): 0 percent impaired, limited or restricted    Time: [id number] PT Time Calculation (min) (ACUTE ONLY): 33 min   Charges:   PT Evaluation $PT Eval Moderate Complexity: 1 Procedure PT Treatments $Gait Training: 8-22 mins   PT G Codes:   PT G-Codes **NOT FOR INPATIENT CLASS** Functional Assessment Tool Used: clinical Judgement Functional Limitation: Mobility: Walking and moving around Mobility: Walking and Moving Around Current Status ([id number](G8978): At least 20 percent but less than 40 percent impaired, limited or restricted Mobility: Walking and Moving Around Goal Status [phone](G8979): 0 percent impaired, limited or restricted    Van ClinesGarrigan, Carlesha Seiple Hamff 10/03/2015, 11:18 AM  Van ClinesHolly Kourtlyn Charlet, PT  Acute Rehabilitation Services Pager [phone] Office [phone]

## 2015-10-03 NOTE — Progress Notes (Signed)
PROGRESS NOTE                                                                                                                                                                                                             Patient Demographics:    [name] Short, is a 82 y.o. female, DOB - 08-Apr-1928, ZOX:[id number]  Admit date - 10/02/2015   Admitting Physician Eduard Clos, MD  Outpatient Primary MD for the patient is MAZZOCCHI, Rise Mu, MD  LOS - 1  Outpatient Specialists: None  Chief Complaint  Patient presents with  . Weakness  . Fall       Brief Narrative   82 y.o. female with medical history significant of CAD status post CABG, peripheral vascular disease status post aortofemoral bypass, hypertension, hyperlipidemia and recently worsening memory problem and was started on Aricept and had wisdom tooth removed last week on 09/29/2015 and is on amoxicillin was brought to the ER after patient had dizziness and fall. Patient not a very good historian with progressive d few months. In the ED MRI of the brain showed right frontal and cerebellar peduncle infarct.She also had A. Fib with RVR on presentation which subsequently improved. She was also found to be hypoxic on room air requiring up to 8 L of oxygen.(had received some fluids and Ativan for MRI).      Subjective:   Patient was given total 10 mouth Lasix this morning.not appear in any distress on my exam. Reported being on oxygen at home,but overall is a poor historian.   Assessment  & Plan :    Principal Problem:   Acute embolic stroke (HCC) Risk factors include CAD, PVD, diabetes, CHF, hypertension and A. Fib. patient underwent evaluation possibly due to fall risk. Continued home dose of aspirin and Plavix.seen by SLP and recommend sysphagia level I diet.  follow 2-D echo and carotid Doppler.LDL of 101. Will add Zocor.check A1c. PT evaluation recommends SNF.  Further recommendation per stroke team.  Active Problems: Acute hypoxic respiratory failure Suspect patient has underlying COPD with? Cor pulmonale. CT angiogram of the chest negative for PE and shows moderate right heart failure.continue Lasix as needed with permissive hypertension.(Appeared euvolemic on my exam this morning after she had received Lasix earlier). Maintaining O2 sat on 2 L. Follow 2-D echo results.  A. Fib with RVR Not on  adequate evaluation possibly due to fall risk. CADS2 vasc of 5.  Continue aspirin. follow 2-D echo results.    CAD (coronary artery disease) of artery bypass graft Continue aspirin and Plavix. Add statin.    Essential hypertension Stable. Allow permissive BP.ontinue home medications   Dementia Reportedly progressive over past few months. continue aricept  Recent removal of wisdom tooth On amoxicillin which is continued.  Hypokalemia replenished     Code Status : DNR  Family Communication  : none at bedside  Disposition Plan  : SNF once symptoms improved and w/up completed  Barriers For Discharge :  active symptoms. Pending completion of workup  Consults  :  neurology  Procedures  :  CT head and cervical spine  MRI brain 2d echo CT angio chest   DVT Prophylaxis  :  Lovenox -   Lab Results  Component Value Date   PLT 209 10/02/2015    Antibiotics  :   Anti-infectives    Start     Dose/Rate Route Frequency Ordered Stop   10/03/15 0700  amoxicillin (AMOXIL) capsule 500 mg     500 mg Oral Every 8 hours 10/03/15 0659          Objective:   Filed Vitals:   10/03/15 0602 10/03/15 0802 10/03/15 1055 10/03/15 1253  BP: 174/93 150/48 175/99 160/89  Pulse: 109 102 115 120  Temp: 98.5 F (36.9 C) 98.6 F (37 C) 98.5 F (36.9 C) 97.7 F (36.5 C)  TempSrc:  Oral Oral Oral  Resp: 16 16 18 18   SpO2: 100% 96% 100% 100%    Wt Readings from Last 3 Encounters:  10/14/14 64.864 kg (143 lb)  08/16/14 63.504 kg (140 lb)    03/12/14 60.782 kg (134 lb)     Intake/Output Summary (Last 24 hours) at 10/03/15 1356 Last data filed at 10/03/15 0528  Gross per 24 hour  Intake      0 ml  Output    225 ml  Net   -225 ml     Physical Exam  Gen: not in distress, confused HEENT: no pallor, moist mucosa, supple neck Chest: diminished bibasilar breath sounds CVS: S1 and S2 irregular, no murmurs, rubs or gallop GI: soft, NT, ND, BS+ Musculoskeletal: warm, no edema CNS: AAOx1-2, non fcoal    Data Review:    CBC  Recent Labs Lab 10/02/15 1810  WBC 10.5  HGB 10.1*  HCT 31.9*  PLT 209  MCV 91.9  MCH 29.1  MCHC 31.7  RDW 15.6*  LYMPHSABS 1.1  MONOABS 1.3*  EOSABS 0.2  BASOSABS 0.0    Chemistries   Recent Labs Lab 10/02/15 1810  NA 137  K 3.4*  CL 99*  CO2 29  GLUCOSE 121*  BUN 11  CREATININE 1.09*  CALCIUM 8.9   ------------------------------------------------------------------------------------------------------------------  Recent Labs  10/03/15 0546  CHOL 149  HDL 26*  LDLCALC 101*  TRIG 111  CHOLHDL 5.7    Lab Results  Component Value Date   HGBA1C 5.4 10/08/2012   ------------------------------------------------------------------------------------------------------------------  Recent Labs  10/03/15 0728  TSH 1.239   ------------------------------------------------------------------------------------------------------------------ No results for input(s): VITAMINB12, FOLATE, FERRITIN, TIBC, IRON, RETICCTPCT in the last 72 hours.  Coagulation profile No results for input(s): INR, PROTIME in the last 168 hours.   Recent Labs  10/02/15 2255  DDIMER 2.34*    Cardiac Enzymes  Recent Labs Lab 10/02/15 2255 10/03/15 0545 10/03/15 1010  TROPONINI 0.04* 0.03 0.03   ------------------------------------------------------------------------------------------------------------------    Component Value  Date/Time   BNP 680.6* 10/02/2015 2255    Inpatient  Medications  Scheduled Meds: . amoxicillin  500 mg Oral Q8H  . antiseptic oral rinse  7 mL Mouth Rinse BID  . aspirin EC  81 mg Oral Daily  . calcium-vitamin D  1 tablet Oral BID  . cholecalciferol  1,000 Units Oral Daily  . clopidogrel  75 mg Oral Daily  . donepezil  10 mg Oral QHS  . enoxaparin (LOVENOX) injection  40 mg Subcutaneous Q24H  . famotidine  20 mg Oral Daily  . levothyroxine  37.5 mcg Oral QAC breakfast  . loratadine  10 mg Oral Daily  . losartan  100 mg Oral Daily  . metoprolol  2.5 mg Intravenous Q6H  . omega-3 acid ethyl esters  2 g Oral Daily  . PARoxetine  30 mg Oral Daily  . predniSONE  20 mg Oral Q breakfast  . ranolazine  500 mg Oral BID  . senna  1 tablet Oral QHS  . sodium chloride flush  3 mL Intravenous Q12H  . traZODone  75 mg Oral QHS   Continuous Infusions:  PRN Meds:.albuterol, HYDROcodone-acetaminophen, ipratropium-albuterol  Micro Results Recent Results (from the past 240 hour(s))  MRSA PCR Screening     Status: Abnormal   Collection Time: 10/02/15 11:04 PM  Result Value Ref Range Status   MRSA by PCR POSITIVE (A) NEGATIVE Final    Comment:        The GeneXpert MRSA Assay (FDA approved for NASAL specimens only), is one component of a comprehensive MRSA colonization surveillance program. It is not intended to diagnose MRSA infection nor to guide or monitor treatment for MRSA infections. RESULT CALLED TO, READ BACK BY AND VERIFIED WITHGlade Nurse RN AT 1610 10/03/15 MITCHELL,L     Radiology Reports Ct Head Wo Contrast  10/02/2015  CLINICAL DATA:  Unwitnessed fall, dizziness. History of diabetes, hypertension and dementia. EXAM: CT HEAD WITHOUT CONTRAST CT CERVICAL SPINE WITHOUT CONTRAST TECHNIQUE: Multidetector CT imaging of the head and cervical spine was performed following the standard protocol without intravenous contrast. Multiplanar CT image reconstructions of the cervical spine were also generated. COMPARISON:  Brain MRI from earlier  same day. Comparison also made to head CT dated 09/23/2014 and cervical spine CT dated 09/21/2014. FINDINGS: CT HEAD FINDINGS There is mild generalized age-related brain atrophy with commensurate dilatation of the ventricles and sulci. Chronic small vessel ischemic changes noted within the periventricular white matter regions bilaterally. There is no mass, hemorrhage, edema or other evidence of acute parenchymal abnormality. No extra-axial hemorrhage. No osseous fracture or displacement. CT CERVICAL SPINE FINDINGS Degenerative changes again noted throughout the cervical spine, with associated disc space narrowings and osseous spurring at multiple levels, not significantly changed compared to the previous cervical spine CT of 09/21/2014. Associated mild disc bulges at multiple levels, but no more than mild central canal stenosis at any level. Osseous alignment is stable. No fracture line or displaced fracture fragment identified. Facet joints remain normally aligned. Atherosclerotic calcifications noted at each carotid bulb region. Paravertebral soft tissues otherwise unremarkable. IMPRESSION: 1. No evidence of acute intracranial abnormality on this head CT. No intracranial mass, hemorrhage or edema. No skull fracture. Atrophy and chronic ischemic changes in the white matter. Please note that brain MRI report from earlier today describes suspected punctate acute infarcts in the right middle cerebellar peduncle and right frontal periventricular white matter. 2. No fracture or acute subluxation within the cervical spine. Stable degenerative change. Atherosclerotic calcifications at each  carotid bulb region. Electronically Signed   By: Bary Richard M.D.   On: 10/02/2015 19:51   Ct Angio Chest Pe W/cm &/or Wo Cm  10/03/2015  CLINICAL DATA:  Shortness of breath. EXAM: CT ANGIOGRAPHY CHEST WITH CONTRAST TECHNIQUE: Multidetector CT imaging of the chest was performed using the standard protocol during bolus  administration of intravenous contrast. Multiplanar CT image reconstructions and MIPs were obtained to evaluate the vascular anatomy. CONTRAST:  100 mL Isovue 370 IV COMPARISON:  Radiograph yesterday. FINDINGS: There are no filling defects within the pulmonary arteries to suggest pulmonary embolus. Cardiomegaly with contrast refluxing into the hepatic veins and IVC. Post median sternotomy with calcifications of the native coronary arteries. Atherosclerosis normal caliber thoracic aorta. No pericardial effusion. Prominent prevascular, lower paratracheal, and right hilar lymph nodes are likely reactive. Small to moderate bilateral pleural effusions. No septal thickening consistent pulmonary edema. Mild compressive atelectasis at the lung bases. No pneumonia. Patulous esophagus. Right axillary bypass graft, patency not assessed due to phase of contrast timing. No acute abnormality in the upper abdomen other than venous reflux in the liver. There are no acute or suspicious osseous abnormalities. Age related degenerative change in the spine. Review of the MIP images confirms the above findings. IMPRESSION: 1. No pulmonary embolus. 2. Findings consistent with right heart failure, at least moderate in degree. 3. Shotty mediastinal and right hilar adenopathy is likely reactive. Electronically Signed   By: Rubye Oaks M.D.   On: 10/03/2015 05:24   Ct Cervical Spine Wo Contrast  10/02/2015  CLINICAL DATA:  Unwitnessed fall, dizziness. History of diabetes, hypertension and dementia. EXAM: CT HEAD WITHOUT CONTRAST CT CERVICAL SPINE WITHOUT CONTRAST TECHNIQUE: Multidetector CT imaging of the head and cervical spine was performed following the standard protocol without intravenous contrast. Multiplanar CT image reconstructions of the cervical spine were also generated. COMPARISON:  Brain MRI from earlier same day. Comparison also made to head CT dated 09/23/2014 and cervical spine CT dated 09/21/2014. FINDINGS: CT HEAD  FINDINGS There is mild generalized age-related brain atrophy with commensurate dilatation of the ventricles and sulci. Chronic small vessel ischemic changes noted within the periventricular white matter regions bilaterally. There is no mass, hemorrhage, edema or other evidence of acute parenchymal abnormality. No extra-axial hemorrhage. No osseous fracture or displacement. CT CERVICAL SPINE FINDINGS Degenerative changes again noted throughout the cervical spine, with associated disc space narrowings and osseous spurring at multiple levels, not significantly changed compared to the previous cervical spine CT of 09/21/2014. Associated mild disc bulges at multiple levels, but no more than mild central canal stenosis at any level. Osseous alignment is stable. No fracture line or displaced fracture fragment identified. Facet joints remain normally aligned. Atherosclerotic calcifications noted at each carotid bulb region. Paravertebral soft tissues otherwise unremarkable. IMPRESSION: 1. No evidence of acute intracranial abnormality on this head CT. No intracranial mass, hemorrhage or edema. No skull fracture. Atrophy and chronic ischemic changes in the white matter. Please note that brain MRI report from earlier today describes suspected punctate acute infarcts in the right middle cerebellar peduncle and right frontal periventricular white matter. 2. No fracture or acute subluxation within the cervical spine. Stable degenerative change. Atherosclerotic calcifications at each carotid bulb region. Electronically Signed   By: Bary Richard M.D.   On: 10/02/2015 19:51   Mr Brain Wo Contrast  10/02/2015  CLINICAL DATA:  Dizziness with standing. On witnessed fall. Initial encounter. EXAM: MRI HEAD WITHOUT CONTRAST TECHNIQUE: Multiplanar, multiecho pulse sequences of the brain and  surrounding structures were obtained without intravenous contrast. COMPARISON:  09/23/2014 FINDINGS: Some sequences are mildly to moderately motion  degraded. There is a punctate, 3 mm focus of increased trace diffusion signal in the medial right middle cerebellar peduncle with suggestion of slightly reduced ADC (series 4, image 12). This is not confirmed on the coronal diffusion series, however this may be due to slice selection. There is a similar punctate 2 mm focus of abnormal diffusion signal in the right frontal lobe periventricular white matter with reduced ADC (series 4, image 31). A 2 mm focus of abnormal trace diffusion signal in the right corona radiata (series 4, image 32) is without clearly reduced ADC and is not confirmed on coronal diffusion series. There is no evidence of intracranial hemorrhage, mass, midline shift, or extra-axial fluid collection. Patchy T2 hyperintensities throughout the subcortical and periventricular cerebral white matter bilaterally have not significantly changed from the prior MRI and are nonspecific but compatible with moderate chronic small vessel ischemic disease. Mild cerebral atrophy is unchanged. Focal subcortical T2 hyperintensity in the anterior left temporal lobe is grossly unchanged within limitations of motion artifact, presumably gliosis related to prior ischemia or trauma. Prior bilateral cataract extraction is noted. Paranasal sinuses and mastoid air cells are clear. Major intracranial vascular flow voids are grossly preserved. IMPRESSION: 1. Suspected punctate acute infarcts in the right middle cerebellar peduncle and right frontal periventricular white matter. 2. Moderate chronic small vessel ischemic disease. Electronically Signed   By: Sebastian AcheAllen  Grady M.D.   On: 10/02/2015 19:36   Dg Pelvis Portable  10/03/2015  CLINICAL DATA:  Fall tonight with right-sided pelvic pain. EXAM: PORTABLE PELVIS 1-2 VIEWS COMPARISON:  None. FINDINGS: There is no evidence of pelvic fracture or diastasis. No pelvic bone lesions are seen. Age related degenerative change of the pubic symphysis and both hips. Multifocal surgical  clips in the pelvis. IMPRESSION: No evidence of pelvic fracture. Electronically Signed   By: Rubye OaksMelanie  Ehinger M.D.   On: 10/03/2015 03:27   Dg Chest Port 1 View  10/03/2015  CLINICAL DATA:  Hypoxia.  Fall tonight. EXAM: PORTABLE CHEST 1 VIEW COMPARISON:  10/04/2014 FINDINGS: Patient is post median sternotomy. Cardiomegaly is unchanged. Bibasilar opacities, left greater than right, likely atelectasis. There is mild vascular congestion. No large pleural effusion or evidence of pneumothorax. No acute osseous abnormalities are seen. IMPRESSION: Stable cardiomegaly.  Mild vascular congestion. Bibasilar atelectasis, left greater than right. Electronically Signed   By: Rubye OaksMelanie  Ehinger M.D.   On: 10/03/2015 03:29   Mr Maxine GlennMra Head/brain Wo Cm  10/03/2015  CLINICAL DATA:  Followup acute infarct. EXAM: MRA HEAD WITHOUT CONTRAST TECHNIQUE: Angiographic images of the Circle of Willis were obtained using MRA technique without intravenous contrast. COMPARISON:  None. FINDINGS: Symmetric carotid arteries.  Aplastic right A1 segment. Atherosclerotic type irregularity involving the bilateral carotid siphon with mild right cavernous segment narrowing. No evidence of major branch occlusion. Negative for aneurysm. Flow in the non dominant proximal right V4 segment is not visualized, with flow both proximal and distal to this segment. This could be related to slow flow, directional flow, or occlusion with downstream reconstitution. No signs of intramural hematoma on motion degraded conventional study from yesterday. Left PICA is visualized best on source images. No other inferior cerebellar arteries are seen. No major PCA branch occlusion. Negative for aneurysm. IMPRESSION: 1. No visible flow in the non dominant proximal right V4 segment with normalization by the bibasilar. 2. No flow limiting stenosis or major branch occlusion seen in  the anterior circulation. Electronically Signed   By: Marnee Spring M.D.   On: 10/03/2015 12:34      Time Spent in minutes  25   Eddie North M.D on 10/03/2015 at 1:56 PM  Between 7am to 7pm - Pager - [phone]  After 7pm go to [email or web address] - password [facility]  [facility] -  Office  [phone]

## 2015-10-03 NOTE — Evaluation (Signed)
Clinical/Bedside Swallow Evaluation Patient Details  Name: [name] Short MRN: [id number] Date of Birth: 1927-09-06  Today's Date: 10/03/2015 Time: SLP Start Time (ACUTE ONLY): 7829 SLP Stop Time (ACUTE ONLY): 1014 SLP Time Calculation (min) (ACUTE ONLY): 21 min  Past Medical History:  Past Medical History  Diagnosis Date  . Diabetes mellitus without complication (HCC)   . Hypertension   . Coronary artery disease   . Thyroid disease   . Depression   . Memory loss   . PVD (peripheral vascular disease) (HCC)   . CHF (congestive heart failure) (HCC)   . Atrial fibrillation (HCC)   . Myocardial infarction (HCC)   . Dyspnea 06/01/2012    2D Echo - EF 60-65, mitral valve calcified annulus, left atrium severely dilated, right atrium moderately dilated  . Chest pain 10/07/2009    2D Echo - EF 50-55, mitral valve calcified annulus  . CAD (coronary artery disease) 03/01/2008    Lexiscan - EF 79, no ECG changes/EKG negative for ischemia, post-stress EF 86  . Cerebral atherosclerosis 10/15/2011    Extracranial Examination - mild-moderate amount smooth mixed density plaque elevating velocities within the proximal and mid segments of the internal carotid artery  . Claudication (HCC) 09/01/2010    LEA Doppler - Bilateral ABIs-demonstrated moderate arterial insufficiency to the lower extremities at rest, Right CIA/EIA-known occlusive disease, PV Cath  . Anginal pain (HCC)     not in in last month  . Dementia   . GERD (gastroesophageal reflux disease)     takes prevacid  . Arthritis    Past Surgical History:  Past Surgical History  Procedure Laterality Date  . Abdominal hysterectomy    . Aorto- femoral bipass  2011  . Joint replacement Left 2010    knee  . Cardiac catheterization  2012  . Cardiac catheterization  06/02/2012    Unchanged anatomy compared to 3 years ago, continue medical therapy  . Cardiac catheterization  10/06/2009    Occluded right coronary artery, left-to-right collateral  noncritical left disease with occluded grafts  . Angioplasty Left 02/23/2010    Darcon patch, 80-90% stenosis proximal to the anastomosis  . Coronary artery bypass graft  1993  . Coronary angioplasty    . Eye surgery    . Axillary-femoral bypass graft Right 10/08/2012    Procedure: BYPASS GRAFT AXILLA-BIFEMORAL;  Surgeon: Pryor Ochoa, MD;  Location: [facility] OR;  Service: Vascular;  Laterality: Right;  . Left heart catheterization with coronary/graft angiogram N/A 06/02/2012    Procedure: LEFT HEART CATHETERIZATION WITH Isabel Caprice;  Surgeon: Runell Gess, MD;  Location: [facility] CATH LAB;  Service: Cardiovascular;  Laterality: N/A;   HPI:  The patient is in with dizziness and a fall.  [name] Short is a 82 y.o. female with medical history significant of CAD status post CABG, peripheral vascular disease status post aortofemoral bypass, hypertension, hyperlipidemia and recently worsening memory problem and was started on Aricept and had wisdom tooth removed last week on 09/29/2015 and is on amoxicillin was brought to the ER after patient had dizziness and fall. As per the patient patient had at least 3 episodes of dizziness. Denies any chest pain shortness of breath headache visual symptoms difficulty swallowing or speaking. Note that patient has been having worsening memory issues and is not a good historian. In the ER MRI brain showed infarcts involving the cerebellar peduncles and right frontal. On-call neurologist Dr. Denna Haggard consulted and patient has been admitted for further management for stroke. On exam patient  became hypoxic requiring 8 L of oxygen. Patient also was initially on A. fib with RVR which improved by the time I examined. On exam patient is not wheezing patient did receive some fluid bolus in the ER and also Ativan for the MRI brain.   Most recent chest x-ray showing bibasilar atelectasis, left greater then right.     Assessment / Plan / Recommendation Clinical  Impression  Clinical swallowing evaluation was completed.  Limited oral mechanism exam was completed due to oral pain from recent root canal.  Lingual and labial range of motion was good.  Strength was unable to be assessed due to oral pain with requested tasks.  The patient presented with mild oral dysphagia characterized by delayed oral transit given pureed material.  Swallow trigger was timely and hyo-laryngeal excursion was judged to be adequate.  Overt s/s of aspiration were not seen.  Per the patient's daughter, she is to be on soft foods until the patient feels she can eater solids and she is NOT allowed to use straws for 10 days.   Recommend begin a dysphagia 1 diet with thin liquids.  The patient may require some assistance for tray set up and possibly with feeding.  ST will follow up for therapeutic diet tolerance and possible diet advancement.       Aspiration Risk  Mild aspiration risk    Diet Recommendation   Dysphagia 1 (puree) with thin liquids.  NO STRAWS!!!   Medication Administration: Crushed with puree    Other  Recommendations Oral Care Recommendations: Oral care BID   Follow up Recommendations   (To be determined.  )    Frequency and Duration min 2x/week  2 weeks       Prognosis Prognosis for Safe Diet Advancement: Good Barriers to Reach Goals: Cognitive deficits      Swallow Study   General Date of Onset: 10/02/15 HPI: The patient is in with dizziness and a fall.  [name] Short is a 82 y.o. female with medical history significant of CAD status post CABG, peripheral vascular disease status post aortofemoral bypass, hypertension, hyperlipidemia and recently worsening memory problem and was started on Aricept and had wisdom tooth removed last week on 09/29/2015 and is on amoxicillin was brought to the ER after patient had dizziness and fall. As per the patient patient had at least 3 episodes of dizziness. Denies any chest pain shortness of breath headache visual symptoms  difficulty swallowing or speaking. Note that patient has been having worsening memory issues and is not a good historian. In the ER MRI brain showed infarcts involving the cerebellar peduncles and right frontal. On-call neurologist Dr. Denna Haggard consulted and patient has been admitted for further management for stroke. On exam patient became hypoxic requiring 8 L of oxygen. Patient also was initially on A. fib with RVR which improved by the time I examined. On exam patient is not wheezing patient did receive some fluid bolus in the ER and also Ativan for the MRI brain.   Most recent chest x-ray showing bibasilar atelectasis, left greater then right.   Type of Study: Bedside Swallow Evaluation Previous Swallow Assessment: None noted.   Diet Prior to this Study: NPO Temperature Spikes Noted: No Respiratory Status: Nasal cannula History of Recent Intubation: No Behavior/Cognition: Alert;Cooperative;Confused;Requires cueing Oral Cavity Assessment: Within Functional Limits Oral Care Completed by SLP: No Oral Cavity - Dentition: Missing dentition Vision: Functional for self-feeding Self-Feeding Abilities: Needs assist Patient Positioning: Upright in chair Baseline Vocal Quality: Normal Volitional  Cough: Strong Volitional Swallow: Able to elicit    Oral/Motor/Sensory Function Overall Oral Motor/Sensory Function: Within functional limits (unable to assess strength due to pain.  ROM was good.  )   Ice Chips Ice chips: Not tested   Thin Liquid Thin Liquid: Within functional limits Presentation: Self Fed;Cup;Spoon    Nectar Thick Nectar Thick Liquid: Not tested   Honey Thick Honey Thick Liquid: Not tested   Puree Puree: Impaired Presentation: Spoon Oral Phase Impairments: Impaired mastication Oral Phase Functional Implications: Prolonged oral transit   Solid   GO   Solid: Not tested    Functional Assessment Tool Used: ASHA NOMS and clinical judgment.   Functional Limitations:  Swallowing Swallow Current Status [phone](G8996): At least 40 percent but less than 60 percent impaired, limited or restricted Swallow Goal Status [phone](G8997): At least 20 percent but less than 40 percent impaired, limited or restricted  [name] AguasMelissa Mandalyn Pasqua, MA, CCC-SLP Acute Rehab SLP [phone] [name] AguasGoodman, Sophy Mesler N 10/03/2015,10:19 AM

## 2015-10-04 DIAGNOSIS — I480 Paroxysmal atrial fibrillation: Secondary | ICD-10-CM

## 2015-10-04 LAB — HEMOGLOBIN A1C
HEMOGLOBIN A1C: 8.2 % — AB (ref 4.8–5.6)
MEAN PLASMA GLUCOSE: 189 mg/dL

## 2015-10-04 MED ORDER — MUPIROCIN 2 % EX OINT
1.0000 "application " | TOPICAL_OINTMENT | Freq: Two times a day (BID) | CUTANEOUS | Status: DC
  Administered 2015-10-04 – 2015-10-05 (×3): 1 via NASAL
  Filled 2015-10-04 (×2): qty 22

## 2015-10-04 MED ORDER — METOPROLOL SUCCINATE ER 25 MG PO TB24
25.0000 mg | ORAL_TABLET | Freq: Every day | ORAL | Status: DC
  Administered 2015-10-04: 25 mg via ORAL
  Filled 2015-10-04: qty 1

## 2015-10-04 MED ORDER — APIXABAN 2.5 MG PO TABS
2.5000 mg | ORAL_TABLET | Freq: Two times a day (BID) | ORAL | Status: DC
  Administered 2015-10-04 – 2015-10-05 (×3): 2.5 mg via ORAL
  Filled 2015-10-04 (×3): qty 1

## 2015-10-04 MED ORDER — CHLORHEXIDINE GLUCONATE CLOTH 2 % EX PADS
6.0000 | MEDICATED_PAD | Freq: Every day | CUTANEOUS | Status: DC
  Administered 2015-10-04 – 2015-10-05 (×2): 6 via TOPICAL

## 2015-10-04 MED ORDER — FUROSEMIDE 20 MG PO TABS
20.0000 mg | ORAL_TABLET | Freq: Every day | ORAL | Status: DC
  Administered 2015-10-04 – 2015-10-05 (×2): 20 mg via ORAL
  Filled 2015-10-04 (×2): qty 1

## 2015-10-04 NOTE — Discharge Instructions (Signed)

## 2015-10-04 NOTE — Clinical Documentation Improvement (Addendum)
Hospitalist  Please document query responses in the progress notes and discharge summary, not on the CD[facility]PA [facility]. Please do not deactivate queries without responding. Please do not deactivate queries if you chose not to respond so other providers may have the opportunity to address the queries Thank you.  Query 1 of 2    (please scroll down for query #2) "A. Fib with RVR" is documented in the current medical record.  Patient was on ASA, Plavix and Toprol XL prior to admission.  Please document the TYPE of atrial fibrillation:  - Chronic  - Paroxsymal  - Other type  - Unable to clinically determine  Query 2 of 2 "CHF" and "? Cor Pulmonale" are documented in the current medial record.  If known or able to determine, please document the acuity and type of CHF or another appropriate diagnosis in the progress notes and discharge summary:  - Acute;  Acute on Chronic;  Chronic;  Other diagnosis;  Unable to clinically determine   - Systolic;  Diastolic;  Combined;  Other diagnosis;  Unable to clinically determine  Clinical Information: Echo this admission dated 10/03/15 - EF 55-60%, Moderate to severe MR, Left atrium severely dilated, Moderate TR, right atrium severely dilated Patient has been treated with IV Lasix this admission Home medications include:  Lasix;  Cozaar;  Toprol XL CT Chest negative for PE and shows moderate right heart failure   Please exercise your independent, professional judgment when responding. A specific answer is not anticipated or expected.   Thank You, Jerral Ralphathy R Rashun Grattan  RN BSN CCDS [phone] Health Information Management [place]

## 2015-10-04 NOTE — Progress Notes (Signed)
Speech Language Pathology Treatment: Dysphagia  Patient Details Name: [name] Short [place] MRN: [id number] DOB: [birth year] Today's Date: 10/04/2015 Time:  -     Assessment / Plan / Recommendation Clinical Impression  Pt was seen for assessment of diet tolerance and appropriateness for advanced consistencies. Pt continues to report significant oral pain, and declined solids requiring mastication. Pt was observed with puree consistencies and thin liquids, and exhibited no overt s/s aspiration with either. Pt reports poor appetite related to oral pain as well. ST to continue to follow for readiness to advance diet.   HPI HPI: The patient is in with dizziness and a fall.  [name] Short [place] is a 82 y.o. female with medical history significant of CAD status post CABG, peripheral vascular disease status post aortofemoral bypass, hypertension, hyperlipidemia and recently worsening memory problem and was started on Aricept and had wisdom tooth removed last week on 09/29/2015 and is on amoxicillin was brought to the ER after patient had dizziness and fall. As per the patient patient had at least 3 episodes of dizziness. Denies any chest pain shortness of breath headache visual symptoms difficulty swallowing or speaking. Note that patient has been having worsening memory issues and is not a good historian. In the ER MRI brain showed infarcts involving the cerebellar peduncles and right frontal. On-call neurologist Dr. Denna Haggardharles Torgerson consulted and patient has been admitted for further management for stroke. On exam patient became hypoxic requiring 8 L of oxygen. Patient also was initially on A. fib with RVR which improved by the time I examined. On exam patient is not wheezing patient did receive some fluid bolus in the ER and also Ativan for the MRI brain.   Most recent chest x-ray showing bibasilar atelectasis, left greater then right.        SLP Plan   diet tolerance, advancement    Recommendations  Diet recommendations:  Dysphagia 1 (puree);Thin liquid Liquids provided via: Cup;No straw Medication Administration: Crushed with puree Supervision: Patient able to self feed;Intermittent supervision to cue for compensatory strategies Compensations: Minimize environmental distractions;Slow rate;Small sips/bites Postural Changes and/or Swallow Maneuvers: Seated upright 90 degrees      Oral Care Recommendations: Oral care BID     Leigh AuroraBueche, Celia Brown 10/04/2015, 10:21 AM  Merlene Laughterelia B. Murvin NatalBueche, [facility] Cous[facility]ta Health Care CenterMSP, CCC-SLP [phone]

## 2015-10-04 NOTE — Progress Notes (Signed)
Physical Therapy Treatment Patient Details Name: [name] Short [place] MRN: [id number] DOB: 1927-06-04 Today's Date: 10/04/2015    History of Present Illness [name] Short [place] is an 82 y.o. female with history of diabetes mellitus, hypertension, coronary artery disease, hyperlipidemia and dementia, brought to the emergency room for evaluation of dizziness which began on the afternoon of 10/01/2015. Patient experienced a fall at the nursing home where she resides. MRI of her brain showed punctate acute infarcts involving the right middle cerebellar peduncle and right periventricular white matter.    PT Comments    Patient is making gradual progress toward mobility goals. Continues to demo posterior lean in standing and with tendency for posterior LOB during ambulation. Current plan remains appropriate.   Follow Up Recommendations  SNF;Supervision/Assistance - 24 hour     Equipment Recommendations  3in1 (PT)    Recommendations for Other Services       Precautions / Restrictions Precautions Precautions: Fall Restrictions Weight Bearing Restrictions: No    Mobility  Bed Mobility Overal bed mobility: Needs Assistance Bed Mobility: Supine to Sit;Sit to Supine     Supine to sit: Supervision Sit to supine: Supervision   General bed mobility comments: supervision for safety  Transfers Overall transfer level: Needs assistance Equipment used: Rolling walker (2 wheeled) Transfers: Sit to/from Stand Sit to Stand: Min assist         General transfer comment: assist for balance upon standing with posterior lean; cues for safe use of AD  Ambulation/Gait Ambulation/Gait assistance: Min assist Ambulation Distance (Feet): 100 Feet Assistive device: Rolling walker (2 wheeled) Gait Pattern/deviations: Shuffle;Trunk flexed     General Gait Details: assist to maintain balance at times with occassional posterior LOB and for guidance of RW; max cues for position of RW, hand placement on RW, posture, and  step length   Stairs            Wheelchair Mobility    Modified Rankin (Stroke Patients Only) Modified Rankin (Stroke Patients Only) Pre-Morbid Rankin Score: Slight disability Modified Rankin: Moderately severe disability     Balance     Sitting balance-Leahy Scale: Good       Standing balance-Leahy Scale: Fair                      Cognition Arousal/Alertness: Awake/alert Behavior During Therapy: WFL for tasks assessed/performed Overall Cognitive Status: Impaired/Different from baseline Area of Impairment: Memory;Orientation;Safety/judgement Orientation Level: Disoriented to;Place;Situation   Memory: Decreased short-term memory   Safety/Judgement: Decreased awareness of deficits;Decreased awareness of safety     General Comments: pt recalled being in a hospital but did not know which one or how she got to the hospital; pt reported that she was on an airplane and had a heart attack and was brought to the hospital from the airplane    Exercises      General Comments        Pertinent Vitals/Pain Pain Assessment: No/denies pain Pain Score: 7  Pain Location: toothache Pain Descriptors / Indicators: Burning (burns, uncomfortable) Pain Intervention(s): Monitored during session    Home Living                      Prior Function            PT Goals (current goals can now be found in the care plan section) Acute Rehab PT Goals Patient Stated Goal: did not state PT Goal Formulation: With patient Time For Goal Achievement: 10/17/15 Potential to Achieve Goals: Good  Progress towards PT goals: Progressing toward goals    Frequency  Min 4X/week    PT Plan Current plan remains appropriate    Co-evaluation             End of Session Equipment Utilized During Treatment: Gait belt Activity Tolerance: Patient tolerated treatment well Patient left: with call bell/phone within reach;in bed;with bed alarm set     Time: 1039-1100 PT  Time Calculation (min) (ACUTE ONLY): 21 min  Charges:  $Gait Training: 8-22 mins                    G Codes:  Functional Assessment Tool Used: clinical Judgement Functional Limitation: Mobility: Walking and moving around Mobility: Walking and Moving Around Current Status [phone]): At least 20 percent but less than 40 percent impaired, limited or restricted Mobility: Walking and Moving Around Goal Status [phone]): 0 percent impaired, limited or restricted   Derek Mound, PTA Pager: [phone]   10/04/2015, 1:18 PM

## 2015-10-04 NOTE — Clinical Social Work Note (Signed)
Clinical Social Work Assessment  Patient Details  Name: [name] SharpsMary Short MRN: [id number] Date of Birth: 01-17-1928  Date of referral:  10/04/15               Reason for consult:  Facility Placement                Permission sought to share information with:  Family Supports, Oceanographeracility Contact Representative Permission granted to share information::  No (pt DO with dementia)  Name::     Designer, fashion/clothingDebra  Agency::  SNFs  Relationship::  dtr/HCPOA  Contact Information:     Housing/Transportation Living arrangements for the past 2 months:  Assisted Living Facility Source of Information:  Adult Children Patient Interpreter Needed:  None Criminal Activity/Legal Involvement Pertinent to Current Situation/Hospitalization:  No - Comment as needed Significant Relationships:  Adult Children Lives with:  Facility Resident Do you feel safe going back to the place where you live?  No Need for family participation in patient care:  Yes (Comment) (decision making)  Care giving concerns:  [facility] due to worsening dementia and current weakness.   Social Worker assessment / plan:  CSW spoke with dtr about plan for pt when ready for DC- [facility] and [facility] lined [facility].  Dtr feels strongly that pt needs SNF stay before going to memory care.  Employment status:  Retired Health and safety inspectornsurance information:  Medicare PT Recommendations:  Skilled Nursing Facility Information / Referral to community resources:  Skilled Nursing Facility  Patient/Family's Response to care:  Dtr is agreeable to [facility] and then transition to memory care unit.  Patient/Family's Understanding of and Emotional Response to Diagnosis, Current Treatment, and Prognosis:  No questions or concerns- dtr is very anxious about pt getting appropriate level of care.  Emotional Assessment Appearance:  Appears stated age Attitude/Demeanor/Rapport:   Unable to Assess Affect (typically observed):  Unable to Assess Orientation:  Oriented to Place, Oriented to Self Alcohol / Substance use:  Not Applicable Psych involvement (Current and /or in the community):  No (Comment)  Discharge Needs  Concerns to be addressed:  Care Coordination Readmission within the last 30 days:  No Current discharge risk:  Physical Impairment Barriers to Discharge:  Continued Medical Work up   Izora RibasHoloman, Elle Vezina M, LCSW 10/04/2015, 5:14 PM

## 2015-10-04 NOTE — Progress Notes (Addendum)
Initial Nutrition Assessment  DOCUMENTATION CODES:   Not applicable  INTERVENTION:  -Continue Ensure Enlive po BID, each supplement provides 350 kcal and 20 grams of protein -RD to continue to monitor -Encourage PO intake  NUTRITION DIAGNOSIS:   Inadequate oral intake related to poor appetite as evidenced by per patient/family report.  GOAL:   Patient will meet greater than or equal to 90% of their needs  MONITOR:   PO intake, Labs, I & O's, Skin, Supplement acceptance  REASON FOR ASSESSMENT:   Malnutrition Screening Tool    ASSESSMENT:   [name] Short is a 82 y.o. female with medical history significant of CAD status post CABG, peripheral vascular disease status post aortofemoral bypass, hypertension, hyperlipidemia and recently worsening memory problem and was started on Aricept and had wisdom tooth removed last week on 09/29/2015 and is on amoxicillin was brought to the ER after patient had dizziness and fall. As per the patient patient had at least 3 episodes of dizziness.  Attempted to speak with [name] Short at bedside but worsening memory prevents patient from providing adequate hx. She had an MRI of brain which demonstrated multiple acute infarcts and stroke. NIH score of 2.  Per NFPE - pt presents well nourished with a few small exceptions Documented PO intake during stay 50%  PO intake at home is questionable but appears to be adequate per NFPE. Exhibits a 14#/9.7% insignificant wt loss in 11 months.  She is currently on NDD1 - thin liquids Endorses good appetite.  Pt requested Ensure, with risk of decreased PO intake following stroke, is appropriate. She also recently had her wisdom teeth removed and expressed oral pain to SLP.  Labs and Medications reviewed: Vitamin D 1000iu PO daily, Cal-Vit D, Prednisone K 3.4  Diet Order:  DIET - DYS 1 Room service appropriate?: Yes; Fluid consistency:: Thin  Skin:  Reviewed, no issues (Ecchymosis to Arm, Hip, Rib,  Face)  Last BM:  PTA  Height:   Ht Readings from Last 1 Encounters:  10/04/15 5' (1.524 m)    Weight:   Wt Readings from Last 1 Encounters:  10/04/15 129 lb 6.6 oz (58.7 kg)    Ideal Body Weight:  45.45 kg  BMI:  Body mass index is 25.27 kg/(m^2).  Estimated Nutritional Needs:   Kcal:  1200-1450 calories  Protein:  60-70 grams  Fluid:  >/= 1.2L  EDUCATION NEEDS:   No education needs identified at this time  Dionne AnoWilliam M. Mikeria Valin, MS, RD LDN Inpatient Clinical Dietitian Pager [phone]

## 2015-10-04 NOTE — [place] FL2 (Signed)
[place] MEDICAID FL2 LEVEL OF CARE SCREENING TOOL     IDENTIFICATION  Patient Name: [name] Short Birthdate: Apr 03, 1928 Sex: female Admission Date (Current Location): 10/02/2015  [facility] and [place] Number:  Producer, television/film/video and Address:  [facility], [address] [place], [place] [address]      Provider Number: [id number]  Attending Physician Name and Address:  Eddie North, MD  Relative Name and Phone Number:       Current Level of Care: Hospital Recommended Level of Care: Skilled Nursing Facility Prior Approval Number:    Date Approved/Denied:   PASRR Number: [id number]  Discharge Plan: SNF    Current Diagnoses: Patient Active Problem List   Diagnosis Date Noted  . Acute CVA (cerebrovascular accident) (HCC) 10/03/2015  . Cerebellar stroke (HCC)   . Paroxysmal atrial fibrillation (HCC)   . HLD (hyperlipidemia)   . Stroke (HCC) 10/02/2015  . Acute embolic stroke (HCC) 10/02/2015  . Stroke (cerebrum) (HCC) 10/02/2015  . Acute respiratory failure with hypoxia (HCC) 10/02/2015  . Essential hypertension 03/12/2013  . Atherosclerosis of native arteries of the extremities with intermittent claudication 11/04/2012  . Preoperative clearance 09/25/2012  . CAD (coronary artery disease) of artery bypass graft 09/25/2012  . Peripheral vascular disease, unspecified (HCC) 09/23/2012  . Aftercare following surgery of the circulatory system, NEC 09/23/2012  . Chest pain 08/11/2012  . Chronic atrial fibrillation- ventricular rate is controlled 08/11/2012  . Dyslipidemia 06/03/2012  . PVD (peripheral vascular disease), AOBF '94 06/03/2012  . NSVT, EF 60-65% by 2D this admission, beta blocker increased 06/03/2012  . CAD, CABG X 2 '93- Last cath 05/2012- no change from 2011, medical Rx- EF of 60-65% via echo 05/2012 06/03/2012  . CHF (congestive heart failure) - mild, BNP 3K 05/31/2012  . Unstable angina (HCC) 05/31/2012  . Diabetes mellitus type II,  controlled (HCC) 05/31/2012  . Atrial fibrillation with controlled ventricular response (HCC) 05/31/2012    Orientation RESPIRATION BLADDER Height & Weight     Self, Place  Normal Continent Weight: 129 lb 6.6 oz (58.7 kg) Height:  5' (152.4 cm)  BEHAVIORAL SYMPTOMS/MOOD NEUROLOGICAL BOWEL NUTRITION STATUS      Continent Diet (see DC summary)  AMBULATORY STATUS COMMUNICATION OF NEEDS Skin   Limited Assist Verbally Normal                       Personal Care Assistance Level of Assistance  Bathing, Dressing Bathing Assistance: Limited assistance   Dressing Assistance: Limited assistance     Functional Limitations Info             SPECIAL CARE FACTORS FREQUENCY  PT (By licensed PT), OT (By licensed OT)     PT Frequency: 5/wk OT Frequency: 5/wk            Contractures      Additional Factors Info  Code Status, Allergies, Isolation Precautions Code Status Info: DNR Allergies Info: Codeine     Isolation Precautions Info: MRSA     Current Medications (10/04/2015):  This is the current hospital active medication list Current Facility-Administered Medications  Medication Dose Route Frequency Provider Last Rate Last Dose  . albuterol (PROVENTIL) (2.5 MG/3ML) 0.083% nebulizer solution 3 mL  3 mL Inhalation Q6H PRN Eduard Clos, MD      . amoxicillin (AMOXIL) capsule 500 mg  500 mg Oral Q8H Eduard Clos, MD   500 mg at 10/04/15 1341  . antiseptic oral rinse (  CPC / CETYLPYRIDINIUM CHLORIDE 0.05%) solution 7 mL  7 mL Mouth Rinse BID Nishant Dhungel, MD   7 mL at 10/04/15 1109  . apixaban (ELIQUIS) tablet 2.5 mg  2.5 mg Oral BID Scarlett Prestoheresa D Egan, RPH   2.5 mg at 10/04/15 1116  . aspirin EC tablet 81 mg  81 mg Oral Daily Eduard ClosArshad N Kakrakandy, MD   81 mg at 10/04/15 1109  . calcium-vitamin D (OSCAL WITH D) 500-200 MG-UNIT per tablet 1 tablet  1 tablet Oral BID Eduard ClosArshad N Kakrakandy, MD   1 tablet at 10/04/15 1108  . Chlorhexidine Gluconate Cloth 2 % PADS 6 each   6 each Topical Q0600 Scarlett Prestoheresa D Egan, [facility]   6 each at 10/04/15 1117  . cholecalciferol (VITAMIN D) tablet 1,000 Units  1,000 Units Oral Daily Eduard ClosArshad N Kakrakandy, MD   1,000 Units at 10/04/15 1109  . donepezil (ARICEPT) tablet 10 mg  10 mg Oral QHS Eduard ClosArshad N Kakrakandy, MD   10 mg at 10/03/15 2312  . famotidine (PEPCID) tablet 20 mg  20 mg Oral Daily Eduard ClosArshad N Kakrakandy, MD   20 mg at 10/04/15 1109  . feeding supplement (ENSURE ENLIVE) (ENSURE ENLIVE) liquid 237 mL  237 mL Oral BID BM Nishant Dhungel, MD   237 mL at 10/04/15 1433  . furosemide (LASIX) tablet 20 mg  20 mg Oral Daily Nishant Dhungel, MD   20 mg at 10/04/15 1314  . HYDROcodone-acetaminophen (NORCO/VICODIN) 5-325 MG per tablet 1 tablet  1 tablet Oral Q6H PRN Eduard ClosArshad N Kakrakandy, MD      . ipratropium-albuterol (DUONEB) 0.5-2.5 (3) MG/3ML nebulizer solution 3 mL  3 mL Nebulization Q6H PRN Eduard ClosArshad N Kakrakandy, MD      . levothyroxine (SYNTHROID, LEVOTHROID) tablet 37.5 mcg  37.5 mcg Oral QAC breakfast Eduard ClosArshad N Kakrakandy, MD   37.5 mcg at 10/04/15 [id number]  . loratadine (CLARITIN) tablet 10 mg  10 mg Oral Daily Eduard ClosArshad N Kakrakandy, MD   10 mg at 10/04/15 1109  . losartan (COZAAR) tablet 100 mg  100 mg Oral Daily Eduard ClosArshad N Kakrakandy, MD   100 mg at 10/04/15 1108  . metoprolol succinate (TOPROL-XL) 24 hr tablet 25 mg  25 mg Oral QHS Nishant Dhungel, MD      . mupirocin ointment (BACTROBAN) 2 % 1 application  1 application Nasal BID Scarlett Prestoheresa D Egan, [facility]   1 application at 10/04/15 1110  . omega-3 acid ethyl esters (LOVAZA) capsule 2 g  2 g Oral Daily Eduard ClosArshad N Kakrakandy, MD   2 g at 10/04/15 1108  . PARoxetine (PAXIL) tablet 30 mg  30 mg Oral Daily Eduard ClosArshad N Kakrakandy, MD   30 mg at 10/04/15 1107  . predniSONE (DELTASONE) tablet 20 mg  20 mg Oral Q breakfast Eduard ClosArshad N Kakrakandy, MD   20 mg at 10/04/15 [id number]  . ranolazine (RANEXA) 12 hr tablet 500 mg  500 mg Oral BID Eduard ClosArshad N Kakrakandy, MD   500 mg at 10/04/15 1107  . senna (SENOKOT) tablet 8.6 mg   1 tablet Oral QHS Eduard ClosArshad N Kakrakandy, MD   8.6 mg at 10/03/15 2312  . simvastatin (ZOCOR) tablet 10 mg  10 mg Oral q1800 Nishant Dhungel, MD   10 mg at 10/03/15 1759  . sodium chloride flush (NS) 0.9 % injection 3 mL  3 mL Intravenous Q12H Eduard ClosArshad N Kakrakandy, MD   3 mL at 10/04/15 1110  . traZODone (DESYREL) tablet 75 mg  75 mg Oral QHS Eduard ClosArshad N Kakrakandy, MD  75 mg at 10/03/15 2311     Discharge Medications: Please see discharge summary for a list of discharge medications.  Relevant Imaging Results:  Relevant Lab Results:   Additional Information SS#: [id number]  Izora Ribas, [place]

## 2015-10-04 NOTE — Progress Notes (Addendum)
PROGRESS NOTE                                                                                                                                                                                                             Patient Demographics:    Marcille Barman, is a 82 y.o. female, DOB - [birth year], ZOX:[id number]  Admit date - 10/02/2015   Admitting Physician Eduard Clos, MD  Outpatient Primary MD for the patient is MAZZOCCHI, Rise Mu, MD  LOS - 2  Outpatient Specialists: None  Chief Complaint  Patient presents with  . Weakness  . Fall       Brief Narrative   82 y.o. female with medical history significant of CAD status post CABG, peripheral vascular disease status post aortofemoral bypass, hypertension, hyperlipidemia and recently worsening memory problem and was started on Aricept and had wisdom tooth removed last week on 09/29/2015 and is on amoxicillin was brought to the ER after patient had dizziness and fall. Patient not a very good historian with progressive d few months. In the ED MRI of the brain showed right frontal and cerebellar peduncle infarct.She also had A. Fib with RVR on presentation which subsequently improved. She was also found to be hypoxic on room air requiring up to 8 L of oxygen.(had received some fluids and Ativan for MRI).      Subjective:   Appears stable. Complains of pain in her right lower tooth. No further dyspnea.   Assessment  & Plan :    Principal Problem:   Acute embolic stroke (HCC) Risk factors include CAD, PVD,  CHF, hypertension and A. Fib. (Not on anticoagulation for reasons unclear) -Resumed aspirin and started on any case as per stroke team recommendations. 2-D echo shows EF of 55 and 60% with moderate to severe mitral regurgitation and severely dilated left and right atrium.Marland KitchenMarland KitchenLDL of 101. Added Zocor. PT evaluation recommends SNF.    Active Problems: Acute hypoxic  respiratory failure Possibly due to severe MR seen on echo. CT angiogram of the chest negative for PE. Euvolemic after single dose of IV Lasix. resume her home dose of Lasix. Maintaining O2 sat at present.  Severe mitral regurgitation worsened since her echo from 2014. Will arrange outpatient follow-up with her cardiologist Dr. Allyson Sabal.  A. Fib with RVR Not on anticoagulation possibly due to ?fall risk. CADS2  vasc of 5.  Continue aspirin. Started on liquids as per stroke team recommendations.    CAD (coronary artery disease) of artery bypass graft Continue aspirin. Added statin. Discontinue Plavix since patient is not on adequate correlation.    Essential hypertension Stable. Allow permissive BP.continue home medications   Dementia Reportedly progressive over past few months. continue aricept  Recent removal of wisdom tooth On amoxicillin which is continued.  Hypokalemia replenished     Code Status : DNR  Family Communication  : none at bedside  Disposition Plan  : SNF on 5/24  Barriers For Discharge :  Referral sent out  Consults  :  neurology  Procedures  :  CT head and cervical spine  MRI brain 2d echo CT angio chest   DVT Prophylaxis  :ELIQUIS  Lab Results  Component Value Date   PLT 209 10/02/2015    Antibiotics  :   Anti-infectives    Start     Dose/Rate Route Frequency Ordered Stop   10/03/15 0700  amoxicillin (AMOXIL) capsule 500 mg     500 mg Oral Every 8 hours 10/03/15 0659          Objective:   Filed Vitals:   10/04/15 0442 10/04/15 0800 10/04/15 1000 10/04/15 1414  BP: 132/90   129/83  Pulse: 103   70  Temp: 97.6 F (36.4 C)   98.2 F (36.8 C)  TempSrc: Oral   Oral  Resp: 18   18  Height:   5' (1.524 m)   Weight:  58.7 kg (129 lb 6.6 oz)    SpO2: 95%   93%    Wt Readings from Last 3 Encounters:  10/04/15 58.7 kg (129 lb 6.6 oz)  10/14/14 64.864 kg (143 lb)  08/16/14 63.504 kg (140 lb)     Intake/Output Summary (Last 24  hours) at 10/04/15 1439 Last data filed at 10/04/15 1415  Gross per 24 hour  Intake    240 ml  Output    226 ml  Net     14 ml     Physical Exam  Gen: not in distress,  HEENT: no pallor, moist mucosa, supple neck Chest: diminished bibasilar breath sounds CVS: S1 and S2 irregular, no murmurs, rubs or gallop GI: soft, NT, ND, BS+ Musculoskeletal: warm, no edema CNS: AAOx2, non fcoal    Data Review:    CBC  Recent Labs Lab 10/02/15 1810  WBC 10.5  HGB 10.1*  HCT 31.9*  PLT 209  MCV 91.9  MCH 29.1  MCHC 31.7  RDW 15.6*  LYMPHSABS 1.1  MONOABS 1.3*  EOSABS 0.2  BASOSABS 0.0    Chemistries   Recent Labs Lab 10/02/15 1810  NA 137  K 3.4*  CL 99*  CO2 29  GLUCOSE 121*  BUN 11  CREATININE 1.09*  CALCIUM 8.9   ------------------------------------------------------------------------------------------------------------------  Recent Labs  10/03/15 0546  CHOL 149  HDL 26*  LDLCALC 101*  TRIG 111  CHOLHDL 5.7    Lab Results  Component Value Date   HGBA1C 8.2* 10/03/2015   ------------------------------------------------------------------------------------------------------------------  Recent Labs  10/03/15 0728  TSH 1.239   ------------------------------------------------------------------------------------------------------------------ No results for input(s): VITAMINB12, FOLATE, FERRITIN, TIBC, IRON, RETICCTPCT in the last 72 hours.  Coagulation profile No results for input(s): INR, PROTIME in the last 168 hours.   Recent Labs  10/02/15 2255  DDIMER 2.34*    Cardiac Enzymes  Recent Labs Lab 10/02/15 2255 10/03/15 0545 10/03/15 1010  TROPONINI 0.04* 0.03 0.03   ------------------------------------------------------------------------------------------------------------------  Component Value Date/Time   BNP 680.6* 10/02/2015 2255    Inpatient Medications  Scheduled Meds: . amoxicillin  500 mg Oral Q8H  . antiseptic  oral rinse  7 mL Mouth Rinse BID  . apixaban  2.5 mg Oral BID  . aspirin EC  81 mg Oral Daily  . calcium-vitamin D  1 tablet Oral BID  . Chlorhexidine Gluconate Cloth  6 each Topical Q0600  . cholecalciferol  1,000 Units Oral Daily  . donepezil  10 mg Oral QHS  . famotidine  20 mg Oral Daily  . feeding supplement (ENSURE ENLIVE)  237 mL Oral BID BM  . furosemide  20 mg Oral Daily  . levothyroxine  37.5 mcg Oral QAC breakfast  . loratadine  10 mg Oral Daily  . losartan  100 mg Oral Daily  . metoprolol succinate  25 mg Oral QHS  . mupirocin ointment  1 application Nasal BID  . omega-3 acid ethyl esters  2 g Oral Daily  . PARoxetine  30 mg Oral Daily  . predniSONE  20 mg Oral Q breakfast  . ranolazine  500 mg Oral BID  . senna  1 tablet Oral QHS  . simvastatin  10 mg Oral q1800  . sodium chloride flush  3 mL Intravenous Q12H  . traZODone  75 mg Oral QHS   Continuous Infusions:  PRN Meds:.albuterol, HYDROcodone-acetaminophen, ipratropium-albuterol  Micro Results Recent Results (from the past 240 hour(s))  MRSA PCR Screening     Status: Abnormal   Collection Time: 10/02/15 11:04 PM  Result Value Ref Range Status   MRSA by PCR POSITIVE (A) NEGATIVE Final    Comment:        The GeneXpert MRSA Assay (FDA approved for NASAL specimens only), is one component of a comprehensive MRSA colonization surveillance program. It is not intended to diagnose MRSA infection nor to guide or monitor treatment for MRSA infections. RESULT CALLED TO, READ BACK BY AND VERIFIED WITHGlade Nurse RN AT 1610 10/03/15 MITCHELL,L     Radiology Reports Ct Head Wo Contrast  10/02/2015  CLINICAL DATA:  Unwitnessed fall, dizziness. History of diabetes, hypertension and dementia. EXAM: CT HEAD WITHOUT CONTRAST CT CERVICAL SPINE WITHOUT CONTRAST TECHNIQUE: Multidetector CT imaging of the head and cervical spine was performed following the standard protocol without intravenous contrast. Multiplanar CT image  reconstructions of the cervical spine were also generated. COMPARISON:  Brain MRI from earlier same day. Comparison also made to head CT dated 09/23/2014 and cervical spine CT dated 09/21/2014. FINDINGS: CT HEAD FINDINGS There is mild generalized age-related brain atrophy with commensurate dilatation of the ventricles and sulci. Chronic small vessel ischemic changes noted within the periventricular white matter regions bilaterally. There is no mass, hemorrhage, edema or other evidence of acute parenchymal abnormality. No extra-axial hemorrhage. No osseous fracture or displacement. CT CERVICAL SPINE FINDINGS Degenerative changes again noted throughout the cervical spine, with associated disc space narrowings and osseous spurring at multiple levels, not significantly changed compared to the previous cervical spine CT of 09/21/2014. Associated mild disc bulges at multiple levels, but no more than mild central canal stenosis at any level. Osseous alignment is stable. No fracture line or displaced fracture fragment identified. Facet joints remain normally aligned. Atherosclerotic calcifications noted at each carotid bulb region. Paravertebral soft tissues otherwise unremarkable. IMPRESSION: 1. No evidence of acute intracranial abnormality on this head CT. No intracranial mass, hemorrhage or edema. No skull fracture. Atrophy and chronic ischemic changes in the white matter. Please note that  brain MRI report from earlier today describes suspected punctate acute infarcts in the right middle cerebellar peduncle and right frontal periventricular white matter. 2. No fracture or acute subluxation within the cervical spine. Stable degenerative change. Atherosclerotic calcifications at each carotid bulb region. Electronically Signed   By: Bary RichardStan  Maynard M.D.   On: 10/02/2015 19:51   Ct Angio Chest Pe W/cm &/or Wo Cm  10/03/2015  CLINICAL DATA:  Shortness of breath. EXAM: CT ANGIOGRAPHY CHEST WITH CONTRAST TECHNIQUE:  Multidetector CT imaging of the chest was performed using the standard protocol during bolus administration of intravenous contrast. Multiplanar CT image reconstructions and MIPs were obtained to evaluate the vascular anatomy. CONTRAST:  100 mL Isovue 370 IV COMPARISON:  Radiograph yesterday. FINDINGS: There are no filling defects within the pulmonary arteries to suggest pulmonary embolus. Cardiomegaly with contrast refluxing into the hepatic veins and IVC. Post median sternotomy with calcifications of the native coronary arteries. Atherosclerosis normal caliber thoracic aorta. No pericardial effusion. Prominent prevascular, lower paratracheal, and right hilar lymph nodes are likely reactive. Small to moderate bilateral pleural effusions. No septal thickening consistent pulmonary edema. Mild compressive atelectasis at the lung bases. No pneumonia. Patulous esophagus. Right axillary bypass graft, patency not assessed due to phase of contrast timing. No acute abnormality in the upper abdomen other than venous reflux in the liver. There are no acute or suspicious osseous abnormalities. Age related degenerative change in the spine. Review of the MIP images confirms the above findings. IMPRESSION: 1. No pulmonary embolus. 2. Findings consistent with right heart failure, at least moderate in degree. 3. Shotty mediastinal and right hilar adenopathy is likely reactive. Electronically Signed   By: Rubye OaksMelanie  Ehinger M.D.   On: 10/03/2015 05:24   Ct Cervical Spine Wo Contrast  10/02/2015  CLINICAL DATA:  Unwitnessed fall, dizziness. History of diabetes, hypertension and dementia. EXAM: CT HEAD WITHOUT CONTRAST CT CERVICAL SPINE WITHOUT CONTRAST TECHNIQUE: Multidetector CT imaging of the head and cervical spine was performed following the standard protocol without intravenous contrast. Multiplanar CT image reconstructions of the cervical spine were also generated. COMPARISON:  Brain MRI from earlier same day. Comparison also  made to head CT dated 09/23/2014 and cervical spine CT dated 09/21/2014. FINDINGS: CT HEAD FINDINGS There is mild generalized age-related brain atrophy with commensurate dilatation of the ventricles and sulci. Chronic small vessel ischemic changes noted within the periventricular white matter regions bilaterally. There is no mass, hemorrhage, edema or other evidence of acute parenchymal abnormality. No extra-axial hemorrhage. No osseous fracture or displacement. CT CERVICAL SPINE FINDINGS Degenerative changes again noted throughout the cervical spine, with associated disc space narrowings and osseous spurring at multiple levels, not significantly changed compared to the previous cervical spine CT of 09/21/2014. Associated mild disc bulges at multiple levels, but no more than mild central canal stenosis at any level. Osseous alignment is stable. No fracture line or displaced fracture fragment identified. Facet joints remain normally aligned. Atherosclerotic calcifications noted at each carotid bulb region. Paravertebral soft tissues otherwise unremarkable. IMPRESSION: 1. No evidence of acute intracranial abnormality on this head CT. No intracranial mass, hemorrhage or edema. No skull fracture. Atrophy and chronic ischemic changes in the white matter. Please note that brain MRI report from earlier today describes suspected punctate acute infarcts in the right middle cerebellar peduncle and right frontal periventricular white matter. 2. No fracture or acute subluxation within the cervical spine. Stable degenerative change. Atherosclerotic calcifications at each carotid bulb region. Electronically Signed   By: Bary RichardStan  Maynard  M.D.   On: 10/02/2015 19:51   Mr Brain Wo Contrast  10/02/2015  CLINICAL DATA:  Dizziness with standing. On witnessed fall. Initial encounter. EXAM: MRI HEAD WITHOUT CONTRAST TECHNIQUE: Multiplanar, multiecho pulse sequences of the brain and surrounding structures were obtained without intravenous  contrast. COMPARISON:  09/23/2014 FINDINGS: Some sequences are mildly to moderately motion degraded. There is a punctate, 3 mm focus of increased trace diffusion signal in the medial right middle cerebellar peduncle with suggestion of slightly reduced ADC (series 4, image 12). This is not confirmed on the coronal diffusion series, however this may be due to slice selection. There is a similar punctate 2 mm focus of abnormal diffusion signal in the right frontal lobe periventricular white matter with reduced ADC (series 4, image 31). A 2 mm focus of abnormal trace diffusion signal in the right corona radiata (series 4, image 32) is without clearly reduced ADC and is not confirmed on coronal diffusion series. There is no evidence of intracranial hemorrhage, mass, midline shift, or extra-axial fluid collection. Patchy T2 hyperintensities throughout the subcortical and periventricular cerebral white matter bilaterally have not significantly changed from the prior MRI and are nonspecific but compatible with moderate chronic small vessel ischemic disease. Mild cerebral atrophy is unchanged. Focal subcortical T2 hyperintensity in the anterior left temporal lobe is grossly unchanged within limitations of motion artifact, presumably gliosis related to prior ischemia or trauma. Prior bilateral cataract extraction is noted. Paranasal sinuses and mastoid air cells are clear. Major intracranial vascular flow voids are grossly preserved. IMPRESSION: 1. Suspected punctate acute infarcts in the right middle cerebellar peduncle and right frontal periventricular white matter. 2. Moderate chronic small vessel ischemic disease. Electronically Signed   By: Sebastian Ache M.D.   On: 10/02/2015 19:36   Dg Pelvis Portable  10/03/2015  CLINICAL DATA:  Fall tonight with right-sided pelvic pain. EXAM: PORTABLE PELVIS 1-2 VIEWS COMPARISON:  None. FINDINGS: There is no evidence of pelvic fracture or diastasis. No pelvic bone lesions are seen.  Age related degenerative change of the pubic symphysis and both hips. Multifocal surgical clips in the pelvis. IMPRESSION: No evidence of pelvic fracture. Electronically Signed   By: Rubye Oaks M.D.   On: 10/03/2015 03:27   Dg Chest Port 1 View  10/03/2015  CLINICAL DATA:  Hypoxia.  Fall tonight. EXAM: PORTABLE CHEST 1 VIEW COMPARISON:  10/04/2014 FINDINGS: Patient is post median sternotomy. Cardiomegaly is unchanged. Bibasilar opacities, left greater than right, likely atelectasis. There is mild vascular congestion. No large pleural effusion or evidence of pneumothorax. No acute osseous abnormalities are seen. IMPRESSION: Stable cardiomegaly.  Mild vascular congestion. Bibasilar atelectasis, left greater than right. Electronically Signed   By: Rubye Oaks M.D.   On: 10/03/2015 03:29   Mr Maxine Glenn Head/brain Wo Cm  10/03/2015  CLINICAL DATA:  Followup acute infarct. EXAM: MRA HEAD WITHOUT CONTRAST TECHNIQUE: Angiographic images of the Circle of Willis were obtained using MRA technique without intravenous contrast. COMPARISON:  None. FINDINGS: Symmetric carotid arteries.  Aplastic right A1 segment. Atherosclerotic type irregularity involving the bilateral carotid siphon with mild right cavernous segment narrowing. No evidence of major branch occlusion. Negative for aneurysm. Flow in the non dominant proximal right V4 segment is not visualized, with flow both proximal and distal to this segment. This could be related to slow flow, directional flow, or occlusion with downstream reconstitution. No signs of intramural hematoma on motion degraded conventional study from yesterday. Left PICA is visualized best on source images. No other inferior cerebellar  arteries are seen. No major PCA branch occlusion. Negative for aneurysm. IMPRESSION: 1. No visible flow in the non dominant proximal right V4 segment with normalization by the bibasilar. 2. No flow limiting stenosis or major branch occlusion seen in the  anterior circulation. Electronically Signed   By: Marnee Spring M.D.   On: 10/03/2015 12:34    Time Spent in minutes  25   Eddie North M.D on 10/04/2015 at 2:39 PM  Between 7am to 7pm - Pager - [phone]  After 7pm go to [email or web address] - password [facility]  [facility] -  Office  [phone]

## 2015-10-04 NOTE — Progress Notes (Addendum)
ANTICOAGULATION CONSULT NOTE - Follow Up Consult  Pharmacy Consult for Eliquis Indication: atrial fibrillation and stroke  Allergies  Allergen Reactions  . Codeine Itching and Nausea And Vomiting    Patient Measurements: Height: 5' (152.4 cm) Weight: 129 lb 6.6 oz (58.7 kg) IBW/kg (Calculated) : 45.5  Vital Signs: Temp: 97.6 F (36.4 C) (05/23 0442) Temp Source: Oral (05/23 0442) BP: 132/90 mmHg (05/23 0442) Pulse Rate: 103 (05/23 0442)  Labs:  Recent Labs  10/02/15 1810 10/02/15 2255 10/03/15 0545 10/03/15 1010  HGB 10.1*  --   --   --   HCT 31.9*  --   --   --   PLT 209  --   --   --   CREATININE 1.09*  --   --   --   TROPONINI  --  0.04* 0.03 0.03    Estimated Creatinine Clearance: 30.3 mL/min (by C-G formula based on Cr of 1.09).  Assessment:   82 year old female to begin Eliquis for atrial fibrillation and stroke.     Was on Plavix 75 mg and ASA 81 mg prior to admission. Plavix now stopped, ASA 81 mg to continue.   Has been on Lovenox 40 mg sq q24hrs since admitted 5/21 pm. Last dose ~11pm on 5/22.    > 47 yrs old, weight < 60 kg, creatinine 1.09.  Will begin low-dose Eliquis with 2 of 3 criteria met.  Goal of Therapy:  appropriate Eliquis dose for indication Monitor platelets by anticoagulation protocol: Yes   Plan:   Discontinue Lovenox.  Begin Eliquis 2.5 mg po BID.  Intermittent CBC.  Monitor for any bleeding.  Arty Baumgartner, [place] Pager: [phone] 10/04/2015,10:55 AM

## 2015-10-05 LAB — CBC
HCT: 31.2 % — ABNORMAL LOW (ref 36.0–46.0)
HEMOGLOBIN: 9.7 g/dL — AB (ref 12.0–15.0)
MCH: 28.3 pg (ref 26.0–34.0)
MCHC: 31.1 g/dL (ref 30.0–36.0)
MCV: 91 fL (ref 78.0–100.0)
PLATELETS: 219 10*3/uL (ref 150–400)
RBC: 3.43 MIL/uL — ABNORMAL LOW (ref 3.87–5.11)
RDW: 16 % — AB (ref 11.5–15.5)
WBC: 9.8 10*3/uL (ref 4.0–10.5)

## 2015-10-05 MED ORDER — APIXABAN (ELIQUIS) EDUCATION KIT FOR DVT/PE PATIENTS: 1.0000 | PACK | Freq: Once | Status: DC

## 2015-10-05 MED ORDER — APIXABAN 2.5 MG PO TABS: 2.5000 mg | ORAL_TABLET | Freq: Two times a day (BID) | ORAL | Status: DC

## 2015-10-05 NOTE — Progress Notes (Signed)
O2 sat on room air 97% at rest

## 2015-10-05 NOTE — Clinical Social Work Placement (Signed)
   CLINICAL SOCIAL WORK PLACEMENT  NOTE  Date:  10/05/2015  Patient Details  Name: [name] Short MRN: [id number] Date of Birth: 22-Aug-1927  Clinical Social Work is seeking post-discharge placement for this patient at the Skilled  Nursing Facility level of care (*CSW will initial, date and re-position this form in  chart as items are completed):  Yes   Patient/family provided with [facility] Clinical Social Work Department's list of facilities offering this level of care within the geographic area requested by the patient (or if unable, by the patient's family).  Yes   Patient/family informed of their freedom to choose among providers that offer the needed level of care, that participate in Medicare, Medicaid or managed care program needed by the patient, have an available bed and are willing to accept the patient.  Yes   Patient/family informed of [place]'s ownership interest in [facility] and [facility], as well as of the fact that they are under no obligation to receive care at these facilities.  PASRR submitted to EDS on 10/04/15     PASRR number received on 10/04/15     Existing PASRR number confirmed on       FL2 transmitted to all facilities in geographic area requested by pt/family on 10/04/15     FL2 transmitted to all facilities within larger geographic area on       Patient informed that his/her managed care company has contracts with or will negotiate with certain facilities, including the following:        Yes   Patient/family informed of bed offers received.  [facility]     Physician recommends and patient chooses bed at      [facility] on 10/05/15.  Patient to be transferred to facility by [facility]     Patient family notified on 10/05/15 of transfer.  Name of family member notified:  Daughter [name]     PHYSICIAN       Additional Comment:     _______________________________________________ Mearl LatinNadia S Erman Thum, LCSWA 10/05/2015, 4:44 PM

## 2015-10-05 NOTE — Progress Notes (Signed)
Physical Therapy Treatment Patient Details Name: [name] Short MRN: [id number] DOB: [birth year] Today's Date: 10/05/2015    History of Present Illness [name] SharpsMary Andalon is an 82 y.o. female with history of diabetes mellitus, hypertension, coronary artery disease, hyperlipidemia and dementia, brought to the emergency room for evaluation of dizziness which began on the afternoon of 10/01/2015. Patient experienced a fall at the nursing home where she resides. MRI of her brain showed punctate acute infarcts involving the right middle cerebellar peduncle and right periventricular white matter.    PT Comments    Patient continues to require assistance for all OOB mobility due to balance and cognitive deficits. Current plan remains appropriate.   Follow Up Recommendations  SNF;Supervision/Assistance - 24 hour     Equipment Recommendations  3in1 (PT)    Recommendations for Other Services       Precautions / Restrictions Precautions Precautions: Fall Restrictions Weight Bearing Restrictions: No    Mobility  Bed Mobility Overal bed mobility: Needs Assistance Bed Mobility: Supine to Sit;Sit to Supine     Supine to sit: Supervision Sit to supine: Supervision   General bed mobility comments: supervision for safety and increased time  Transfers Overall transfer level: Needs assistance Equipment used: Rolling walker (2 wheeled) Transfers: Sit to/from [employer];Stand Pivot Transfers Sit to Stand: Min assist Stand pivot transfers: Min assist       General transfer comment: assist for stabiltiy upon standing and guidance of RW when pivoting [facility]; cues for hand placement and safe use of AD  Ambulation/Gait Ambulation/Gait assistance: Min assist Ambulation Distance (Feet): 120 Feet Assistive device: Rolling walker (2 wheeled) Gait Pattern/deviations: Shuffle;Trunk flexed     General Gait Details: assist for balance and guidance of RW with max cues for safe use of AD and posture   Stairs            Wheelchair Mobility    Modified Rankin (Stroke Patients Only) Modified Rankin (Stroke Patients Only) Pre-Morbid Rankin Score: Slight disability Modified Rankin: Moderately severe disability     Balance     Sitting balance-Leahy Scale: Good       Standing balance-Leahy Scale: Fair                      Cognition Arousal/Alertness: Awake/alert Behavior During Therapy: WFL for tasks assessed/performed Overall Cognitive Status: History of cognitive impairments - at baseline Area of Impairment: Memory;Orientation;Safety/judgement Orientation Level: Disoriented to;Place;Situation;Time   Memory: Decreased short-term memory   Safety/Judgement: Decreased awareness of deficits;Decreased awareness of safety          Exercises      General Comments General comments (skin integrity, edema, etc.): daughter and son in law present      Pertinent Vitals/Pain Pain Assessment: No/denies pain    Home Living                      Prior Function            PT Goals (current goals can now be found in the care plan section) Acute Rehab PT Goals Patient Stated Goal: did not state PT Goal Formulation: With patient Time For Goal Achievement: 10/17/15 Potential to Achieve Goals: Good Progress towards PT goals: Progressing toward goals    Frequency  Min 4X/week    PT Plan Current plan remains appropriate    Co-evaluation             End of Session Equipment Utilized During Treatment: Gait belt Activity Tolerance: Patient  tolerated treatment well Patient left: with call bell/phone within reach;in bed;with bed alarm set     Time: 1550-1620 PT Time Calculation (min) (ACUTE ONLY): 30 min  Charges:  $Gait Training: 8-22 mins $Therapeutic Activity: 8-22 mins                    G Codes:  Functional Assessment Tool Used: clinical Judgement Functional Limitation: Mobility: Walking and moving around Mobility: Walking and Moving Around Current  Status [phone]): At least 20 percent but less than 40 percent impaired, limited or restricted Mobility: Walking and Moving Around Goal Status [phone]): 0 percent impaired, limited or restricted   Derek Mound, PTA Pager: [phone]   10/05/2015, 5:19 PM

## 2015-10-05 NOTE — Plan of Care (Signed)
Problem: Education: Goal: Knowledge of disease or condition will improve Outcome: Not Progressing Patient oriented to self only at this time. Patient and family will need further teaching and reinforcement.

## 2015-10-05 NOTE — Discharge Summary (Addendum)
Physician Discharge Summary  [name] Short ZHG:[id number] DOB: 1927-12-11 DOA: 10/02/2015  PCP: Suszanne Conners, MD  Admit date: 10/02/2015 Discharge date: 10/05/2015  Time spent: 35 minutes  Recommendations for Outpatient Follow-up:  1. Patient should resume aspirin as well as continue Elliquis 2.5 twice a day that was started on this admission--Plavix was discontinued this admission 2. Patient is hemodynamically stable however has mild cognitive deficits that are not new 3. It is unclear why the patient is on prednisone 20 mg?-This is a prior to admission medication and as I only met the patient on 10/05/2015 do not feel comfortable tapering it off-patient may need to be off of this eventually 4. Continue amoxicillin for suppressive therapy of left upper molar abscess until definitive oral surgery-root canal was perfromed-1/2 wisdom tooth was taken out--Dentist is Dr. Sharlett Iles [[place]]--needs to be followed in 1-2 months. 5. For mitral regurgitation noted on echo from this admission-follow-up To be scheduled with Dr. Gwenlyn Found of cardiology at his office by skilled nursing physician in about 4-6 weeks 6. Need cmet and cbc 1 week 7. Daily weight and spot O2 sat checks at SNF-consider higher doses of lasix 20 if gains > 2 pounds in 24 hrs  Discharge Diagnoses:  Principal Problem:   Acute embolic stroke [facility]) Active Problems:   CAD (coronary artery disease) of artery bypass graft   Essential hypertension   Stroke [facility])   Stroke (cerebrum) (HCC)   Acute respiratory failure with hypoxia (HCC)   Acute CVA (cerebrovascular accident) ([place])   Cerebellar stroke ([place])   Paroxysmal atrial fibrillation ([place])   HLD (hyperlipidemia)   Discharge Condition: Improved  Diet recommendation: Heart healthy low-salt  Filed Weights   10/04/15 0800  Weight: 58.7 kg (129 lb 6.6 oz)    History of present illness:  82 y.o. ?  CAD status post CABG,  peripheral vascular disease status post aortofemoral  bypass,  hypertension,  hyperlipidemia and recently worsening memory problem and was started on Aricept and had wisdom tooth removed last week on 09/29/2015 and is on amoxicillin was brought to the ER after patient had dizziness and fall.  Patient not a very good historian with progressive d few months.  In the ED MRI of the brain showed right frontal and cerebellar peduncle infarct. She also had A. Fib with RVR on presentation which subsequently improved.  She was also found to be hypoxic on room air requiring up to 8 L of oxygen.(had received some fluids and Ativan for MRI).  Her hypoxia seemed to resolve over the course of hospital stay with clearing of her mental state  Acute embolic stroke [facility])  Risk factors include CAD, PVD,  CHF, hypertension and A. Fib. (Not on anticoagulation for reasons unclear) -Resumed aspirin and started on Eliquis 0.5 twice a day as per stroke team recommendations. 2-D echo shows EF of 55 and 60% with moderate to severe mitral regurgitation and severely dilated left and right atrium LDL of 101.  Added Zocor 80 this admission. PT evaluation recommends SNF.    Acute hypoxic respiratory failure COR pulmonale, R sided HF with PASP 52 Possibly due to severe MR seen on echo.  CT angiogram of the chest negative for PE.  Euvolemic after single dose of IV Lasix. On discharge she was continued home dose Lasix 20 daily.  Maintaining O2 sat at present.  Severe mitral regurgitation worsened since her echo from 2014.  Needs outpatient follow-up with her cardiologist Dr. Gwenlyn Found.  A. Fib with RVR, Chronic Not on anticoagulation  possibly due to ?fall risk.  CHADS2 vasc of 5.   Continue aspirin.  Started Eliquis as per stroke team recommendations.    CAD (coronary artery disease) of artery bypass graft Continue aspirin.  Added statin.  Discontinue Plavix since patient is now on eliquis    Essential hypertension Stable.  Allow permissive BP. continue home  medications  Dementia for ~ 4 years Reportedly progressive over past few months.  continue aricept  Recent removal of wisdom tooth On amoxicillin which is continued.  Hypokalemia replenished   Code Status : DNR  Family Communication : d/w daughter [name]  Disposition Plan: SNF on 5/24  Consults : neurology  Procedures :  CT head and cervical spine MRI brain 2d echo CT angio chest  DVT Prophylaxis :ELIQUIS   Discharge Exam: Filed Vitals:   10/05/15 0149 10/05/15 0548  BP: 143/93 138/87  Pulse: 97 39  Temp: 97.7 F (36.5 C) 97.6 F (36.4 C)  Resp: 16 20    General: eomi ncat slight confusion Cardiovascular: s1 s2 no m/r/g Respiratory: clear no added sound  Discharge Instructions   Discharge Instructions    Ambulatory referral to Neurology    Complete by:  As directed   Follow up with Cecille Rubin, NP, at [facility] in about 2 months. Thanks.     Diet - low sodium heart healthy    Complete by:  As directed      Increase activity slowly    Complete by:  As directed           Current Discharge Medication List    START taking these medications   Details  apixaban (ELIQUIS) 2.5 MG TABS tablet Take 1 tablet (2.5 mg total) by mouth 2 (two) times daily. Qty: 60 tablet, Refills: 0    apixaban (ELIQUIS) KIT 1 each by Does not apply route once.      CONTINUE these medications which have NOT CHANGED   Details  acetaminophen (TYLENOL) 325 MG tablet Take 650 mg by mouth every 4 (four) hours as needed for pain.     albuterol (PROVENTIL HFA;VENTOLIN HFA) 108 (90 BASE) MCG/ACT inhaler Inhale 1-2 puffs into the lungs every 6 (six) hours as needed for wheezing. Qty: 1 Inhaler, Refills: 0    amoxicillin (AMOXIL) 500 MG capsule Take 2,000 mg by mouth as needed (for dental procedures).     aspirin EC 81 MG tablet Take 81 mg by mouth daily.    calcium-vitamin D (OSCAL WITH D) 500-200 MG-UNIT tablet Take 1 tablet by mouth 2 (two) times daily.    cetirizine  (ZYRTEC) 10 MG tablet Take 10 mg by mouth daily.    clopidogrel (PLAVIX) 75 MG tablet Take 75 mg by mouth daily.    diphenhydrAMINE-zinc acetate (BENADRYL) cream Apply 1 application topically 2 (two) times daily.    donepezil (ARICEPT) 5 MG tablet Take 10 mg by mouth at bedtime.     famotidine (PEPCID) 20 MG tablet Take 20 mg by mouth daily.    furosemide (LASIX) 20 MG tablet Take 20 mg by mouth daily.    HYDROcodone-acetaminophen (NORCO/VICODIN) 5-325 MG per tablet Take 1 tablet by mouth every 6 (six) hours as needed for pain. Qty: 40 tablet, Refills: 0    ibuprofen (ADVIL,MOTRIN) 800 MG tablet Take 800 mg by mouth every 8 (eight) hours as needed for moderate pain.     Ipratropium-Albuterol (COMBIVENT RESPIMAT) 20-100 MCG/ACT AERS respimat Inhale 1 puff into the lungs every 6 (six) hours as needed for wheezing.  levothyroxine (SYNTHROID, LEVOTHROID) 25 MCG tablet Take 37.5 mcg by mouth daily before breakfast.    loperamide (IMODIUM) 2 MG capsule Take by mouth as needed for diarrhea or loose stools.    losartan (COZAAR) 100 MG tablet Take 100 mg by mouth daily.    metoprolol succinate (TOPROL-XL) 25 MG 24 hr tablet Take 25 mg by mouth at bedtime.    Naproxen Sodium 220 MG CAPS Take 220 mg by mouth daily with breakfast. All Day Relief- Gluten-Free Tab.    nitroGLYCERIN (NITROSTAT) 0.4 MG SL tablet Place 0.4 mg under the tongue every 5 (five) minutes as needed for chest pain.     omega-3 acid ethyl esters (LOVAZA) 1 G capsule Take 2 g by mouth daily.    PARoxetine (PAXIL) 30 MG tablet Take 30 mg by mouth daily.    polyvinyl alcohol (LIQUIFILM TEARS) 1.4 % ophthalmic solution Place 1 drop into both eyes 4 (four) times daily - after meals and at bedtime.    predniSONE (DELTASONE) 20 MG tablet Take 20 mg by mouth daily with breakfast.    ranolazine (RANEXA) 500 MG 12 hr tablet Take 1 tablet (500 mg total) by mouth 2 (two) times daily. Qty: 60 tablet, Refills: 5    senna  (SENOKOT) 8.6 MG tablet Take 1 tablet by mouth at bedtime.    traZODone (DESYREL) 50 MG tablet Take 75 mg by mouth at bedtime.      STOP taking these medications     amoxicillin (AMOXIL) 500 MG tablet      cholecalciferol (VITAMIN D) 1000 UNITS tablet        Allergies  Allergen Reactions  . Codeine Itching and Nausea And Vomiting   Follow-up Information    Follow up with Dennie Bible, NP. Schedule an appointment as soon as possible for a visit in 2 months.   Specialty:  Family Medicine   Why:  stroke clinic   Contact information:   [address] [place] [address] [phone]        The results of significant diagnostics from this hospitalization (including imaging, microbiology, ancillary and laboratory) are listed below for reference.    Significant Diagnostic Studies: Ct Head Wo Contrast  10/02/2015  CLINICAL DATA:  Unwitnessed fall, dizziness. History of diabetes, hypertension and dementia. EXAM: CT HEAD WITHOUT CONTRAST CT CERVICAL SPINE WITHOUT CONTRAST TECHNIQUE: Multidetector CT imaging of the head and cervical spine was performed following the standard protocol without intravenous contrast. Multiplanar CT image reconstructions of the cervical spine were also generated. COMPARISON:  Brain MRI from earlier same day. Comparison also made to head CT dated 09/23/2014 and cervical spine CT dated 09/21/2014. FINDINGS: CT HEAD FINDINGS There is mild generalized age-related brain atrophy with commensurate dilatation of the ventricles and sulci. Chronic small vessel ischemic changes noted within the periventricular white matter regions bilaterally. There is no mass, hemorrhage, edema or other evidence of acute parenchymal abnormality. No extra-axial hemorrhage. No osseous fracture or displacement. CT CERVICAL SPINE FINDINGS Degenerative changes again noted throughout the cervical spine, with associated disc space narrowings and osseous spurring at multiple  levels, not significantly changed compared to the previous cervical spine CT of 09/21/2014. Associated mild disc bulges at multiple levels, but no more than mild central canal stenosis at any level. Osseous alignment is stable. No fracture line or displaced fracture fragment identified. Facet joints remain normally aligned. Atherosclerotic calcifications noted at each carotid bulb region. Paravertebral soft tissues otherwise unremarkable. IMPRESSION: 1. No evidence of acute intracranial  abnormality on this head CT. No intracranial mass, hemorrhage or edema. No skull fracture. Atrophy and chronic ischemic changes in the white matter. Please note that brain MRI report from earlier today describes suspected punctate acute infarcts in the right middle cerebellar peduncle and right frontal periventricular white matter. 2. No fracture or acute subluxation within the cervical spine. Stable degenerative change. Atherosclerotic calcifications at each carotid bulb region. Electronically Signed   By: Franki Cabot M.D.   On: 10/02/2015 19:51   Ct Angio Chest Pe W/cm &/or Wo Cm  10/03/2015  CLINICAL DATA:  Shortness of breath. EXAM: CT ANGIOGRAPHY CHEST WITH CONTRAST TECHNIQUE: Multidetector CT imaging of the chest was performed using the standard protocol during bolus administration of intravenous contrast. Multiplanar CT image reconstructions and MIPs were obtained to evaluate the vascular anatomy. CONTRAST:  100 mL Isovue 370 IV COMPARISON:  Radiograph yesterday. FINDINGS: There are no filling defects within the pulmonary arteries to suggest pulmonary embolus. Cardiomegaly with contrast refluxing into the hepatic veins and IVC. Post median sternotomy with calcifications of the native coronary arteries. Atherosclerosis normal caliber thoracic aorta. No pericardial effusion. Prominent prevascular, lower paratracheal, and right hilar lymph nodes are likely reactive. Small to moderate bilateral pleural effusions. No septal  thickening consistent pulmonary edema. Mild compressive atelectasis at the lung bases. No pneumonia. Patulous esophagus. Right axillary bypass graft, patency not assessed due to phase of contrast timing. No acute abnormality in the upper abdomen other than venous reflux in the liver. There are no acute or suspicious osseous abnormalities. Age related degenerative change in the spine. Review of the MIP images confirms the above findings. IMPRESSION: 1. No pulmonary embolus. 2. Findings consistent with right heart failure, at least moderate in degree. 3. Shotty mediastinal and right hilar adenopathy is likely reactive. Electronically Signed   By: Jeb Levering M.D.   On: 10/03/2015 05:24   Ct Cervical Spine Wo Contrast  10/02/2015  CLINICAL DATA:  Unwitnessed fall, dizziness. History of diabetes, hypertension and dementia. EXAM: CT HEAD WITHOUT CONTRAST CT CERVICAL SPINE WITHOUT CONTRAST TECHNIQUE: Multidetector CT imaging of the head and cervical spine was performed following the standard protocol without intravenous contrast. Multiplanar CT image reconstructions of the cervical spine were also generated. COMPARISON:  Brain MRI from earlier same day. Comparison also made to head CT dated 09/23/2014 and cervical spine CT dated 09/21/2014. FINDINGS: CT HEAD FINDINGS There is mild generalized age-related brain atrophy with commensurate dilatation of the ventricles and sulci. Chronic small vessel ischemic changes noted within the periventricular white matter regions bilaterally. There is no mass, hemorrhage, edema or other evidence of acute parenchymal abnormality. No extra-axial hemorrhage. No osseous fracture or displacement. CT CERVICAL SPINE FINDINGS Degenerative changes again noted throughout the cervical spine, with associated disc space narrowings and osseous spurring at multiple levels, not significantly changed compared to the previous cervical spine CT of 09/21/2014. Associated mild disc bulges at  multiple levels, but no more than mild central canal stenosis at any level. Osseous alignment is stable. No fracture line or displaced fracture fragment identified. Facet joints remain normally aligned. Atherosclerotic calcifications noted at each carotid bulb region. Paravertebral soft tissues otherwise unremarkable. IMPRESSION: 1. No evidence of acute intracranial abnormality on this head CT. No intracranial mass, hemorrhage or edema. No skull fracture. Atrophy and chronic ischemic changes in the white matter. Please note that brain MRI report from earlier today describes suspected punctate acute infarcts in the right middle cerebellar peduncle and right frontal periventricular white matter. 2. No  fracture or acute subluxation within the cervical spine. Stable degenerative change. Atherosclerotic calcifications at each carotid bulb region. Electronically Signed   By: Franki Cabot M.D.   On: 10/02/2015 19:51   Mr Brain Wo Contrast  10/02/2015  CLINICAL DATA:  Dizziness with standing. On witnessed fall. Initial encounter. EXAM: MRI HEAD WITHOUT CONTRAST TECHNIQUE: Multiplanar, multiecho pulse sequences of the brain and surrounding structures were obtained without intravenous contrast. COMPARISON:  09/23/2014 FINDINGS: Some sequences are mildly to moderately motion degraded. There is a punctate, 3 mm focus of increased trace diffusion signal in the medial right middle cerebellar peduncle with suggestion of slightly reduced ADC (series 4, image 12). This is not confirmed on the coronal diffusion series, however this may be due to slice selection. There is a similar punctate 2 mm focus of abnormal diffusion signal in the right frontal lobe periventricular white matter with reduced ADC (series 4, image 31). A 2 mm focus of abnormal trace diffusion signal in the right corona radiata (series 4, image 32) is without clearly reduced ADC and is not confirmed on coronal diffusion series. There is no evidence of  intracranial hemorrhage, mass, midline shift, or extra-axial fluid collection. Patchy T2 hyperintensities throughout the subcortical and periventricular cerebral white matter bilaterally have not significantly changed from the prior MRI and are nonspecific but compatible with moderate chronic small vessel ischemic disease. Mild cerebral atrophy is unchanged. Focal subcortical T2 hyperintensity in the anterior left temporal lobe is grossly unchanged within limitations of motion artifact, presumably gliosis related to prior ischemia or trauma. Prior bilateral cataract extraction is noted. Paranasal sinuses and mastoid air cells are clear. Major intracranial vascular flow voids are grossly preserved. IMPRESSION: 1. Suspected punctate acute infarcts in the right middle cerebellar peduncle and right frontal periventricular white matter. 2. Moderate chronic small vessel ischemic disease. Electronically Signed   By: Logan Bores M.D.   On: 10/02/2015 19:36   Dg Pelvis Portable  10/03/2015  CLINICAL DATA:  Fall tonight with right-sided pelvic pain. EXAM: PORTABLE PELVIS 1-2 VIEWS COMPARISON:  None. FINDINGS: There is no evidence of pelvic fracture or diastasis. No pelvic bone lesions are seen. Age related degenerative change of the pubic symphysis and both hips. Multifocal surgical clips in the pelvis. IMPRESSION: No evidence of pelvic fracture. Electronically Signed   By: Jeb Levering M.D.   On: 10/03/2015 03:27   Dg Chest Port 1 View  10/03/2015  CLINICAL DATA:  Hypoxia.  Fall tonight. EXAM: PORTABLE CHEST 1 VIEW COMPARISON:  10/04/2014 FINDINGS: Patient is post median sternotomy. Cardiomegaly is unchanged. Bibasilar opacities, left greater than right, likely atelectasis. There is mild vascular congestion. No large pleural effusion or evidence of pneumothorax. No acute osseous abnormalities are seen. IMPRESSION: Stable cardiomegaly.  Mild vascular congestion. Bibasilar atelectasis, left greater than right.  Electronically Signed   By: Jeb Levering M.D.   On: 10/03/2015 03:29   Mr Jodene Nam Head/brain Wo Cm  10/03/2015  CLINICAL DATA:  Followup acute infarct. EXAM: MRA HEAD WITHOUT CONTRAST TECHNIQUE: Angiographic images of the Circle of Willis were obtained using MRA technique without intravenous contrast. COMPARISON:  None. FINDINGS: Symmetric carotid arteries.  Aplastic right A1 segment. Atherosclerotic type irregularity involving the bilateral carotid siphon with mild right cavernous segment narrowing. No evidence of major branch occlusion. Negative for aneurysm. Flow in the non dominant proximal right V4 segment is not visualized, with flow both proximal and distal to this segment. This could be related to slow flow, directional flow, or occlusion with  downstream reconstitution. No signs of intramural hematoma on motion degraded conventional study from yesterday. Left PICA is visualized best on source images. No other inferior cerebellar arteries are seen. No major PCA branch occlusion. Negative for aneurysm. IMPRESSION: 1. No visible flow in the non dominant proximal right V4 segment with normalization by the bibasilar. 2. No flow limiting stenosis or major branch occlusion seen in the anterior circulation. Electronically Signed   By: Monte Fantasia M.D.   On: 10/03/2015 12:34    Microbiology: Recent Results (from the past 240 hour(s))  MRSA PCR Screening     Status: Abnormal   Collection Time: 10/02/15 11:04 PM  Result Value Ref Range Status   MRSA by PCR POSITIVE (A) NEGATIVE Final    Comment:        The GeneXpert MRSA Assay (FDA approved for NASAL specimens only), is one component of a comprehensive MRSA colonization surveillance program. It is not intended to diagnose MRSA infection nor to guide or monitor treatment for MRSA infections. RESULT CALLED TO, READ BACK BY AND VERIFIED WITH: [facility] RN AT [id number] 10/03/15 MITCHELL,L      Labs: Basic Metabolic Panel:  Recent Labs Lab  10/02/15 1810  NA 137  K 3.4*  CL 99*  CO2 29  GLUCOSE 121*  BUN 11  CREATININE 1.09*  CALCIUM 8.9   Liver Function Tests: No results for input(s): AST, ALT, ALKPHOS, BILITOT, PROT, ALBUMIN in the last 168 hours. No results for input(s): LIPASE, AMYLASE in the last 168 hours. No results for input(s): AMMONIA in the last 168 hours. CBC:  Recent Labs Lab 10/02/15 1810 10/05/15 0428  WBC 10.5 9.8  NEUTROABS 8.0*  --   HGB 10.1* 9.7*  HCT 31.9* 31.2*  MCV 91.9 91.0  PLT 209 219   Cardiac Enzymes:  Recent Labs Lab 10/02/15 2255 10/03/15 0545 10/03/15 1010  TROPONINI 0.04* 0.03 0.03   BNP: BNP (last 3 results)  Recent Labs  10/02/15 2255  BNP 680.6*    ProBNP (last 3 results) No results for input(s): PROBNP in the last 8760 hours.  CBG: No results for input(s): GLUCAP in the last 168 hours.     SignedNita Sells MD   [facility] 10/05/2015, 10:11 AM

## 2015-10-05 NOTE — Progress Notes (Signed)
NURSING PROGRESS NOTE  [name] Short [id number] Discharge Data: 10/05/2015 5:07 PM Attending Provider: No att. providers found IRJ:JOACZYSAY, Reggy Eye, MD     Marcellus Scott to be D/C'd Skilled nursing facility per MD order.  Discussed with the patient the After Visit Summary and all questions fully answered. All IV's discontinued with no bleeding noted. All belongings returned to patient for patient to take home.   Last Vital Signs:  Blood pressure 145/92, pulse 102, temperature 98.5 F (36.9 C), temperature source Oral, resp. rate 16, height 5' (1.524 m), weight 58.7 kg (129 lb 6.6 oz), SpO2 94 %.  Discharge Medication List   Medication List    STOP taking these medications        cholecalciferol 1000 units tablet  Commonly known as:  VITAMIN D      TAKE these medications        acetaminophen 325 MG tablet  Commonly known as:  TYLENOL  Take 650 mg by mouth every 4 (four) hours as needed for pain.     albuterol 108 (90 Base) MCG/ACT inhaler  Commonly known as:  PROVENTIL HFA;VENTOLIN HFA  Inhale 1-2 puffs into the lungs every 6 (six) hours as needed for wheezing.     amoxicillin 500 MG capsule  Commonly known as:  AMOXIL  Take 2,000 mg by mouth as needed (for dental procedures).     apixaban 2.5 MG Tabs tablet  Commonly known as:  ELIQUIS  Take 1 tablet (2.5 mg total) by mouth 2 (two) times daily.     apixaban Kit  Commonly known as:  ELIQUIS  1 each by Does not apply route once.     aspirin EC 81 MG tablet  Take 81 mg by mouth daily.     calcium-vitamin D 500-200 MG-UNIT tablet  Commonly known as:  OSCAL WITH D  Take 1 tablet by mouth 2 (two) times daily.     cetirizine 10 MG tablet  Commonly known as:  ZYRTEC  Take 10 mg by mouth daily.     clopidogrel 75 MG tablet  Commonly known as:  PLAVIX  Take 75 mg by mouth daily.     COMBIVENT RESPIMAT 20-100 MCG/ACT Aers respimat  Generic drug:  Ipratropium-Albuterol  Inhale 1 puff into the lungs every 6 (six) hours as  needed for wheezing.     diphenhydrAMINE-zinc acetate cream  Commonly known as:  BENADRYL  Apply 1 application topically 2 (two) times daily.     donepezil 5 MG tablet  Commonly known as:  ARICEPT  Take 10 mg by mouth at bedtime.     famotidine 20 MG tablet  Commonly known as:  PEPCID  Take 20 mg by mouth daily.     furosemide 20 MG tablet  Commonly known as:  LASIX  Take 20 mg by mouth daily.     HYDROcodone-acetaminophen 5-325 MG tablet  Commonly known as:  NORCO/VICODIN  Take 1 tablet by mouth every 6 (six) hours as needed for pain.     ibuprofen 800 MG tablet  Commonly known as:  ADVIL,MOTRIN  Take 800 mg by mouth every 8 (eight) hours as needed for moderate pain.     levothyroxine 25 MCG tablet  Commonly known as:  SYNTHROID, LEVOTHROID  Take 37.5 mcg by mouth daily before breakfast.     loperamide 2 MG capsule  Commonly known as:  IMODIUM  Take by mouth as needed for diarrhea or loose stools.     losartan 100 MG tablet  Commonly  known as:  COZAAR  Take 100 mg by mouth daily.     metoprolol succinate 25 MG 24 hr tablet  Commonly known as:  TOPROL-XL  Take 25 mg by mouth at bedtime.     Naproxen Sodium 220 MG Caps  Take 220 mg by mouth daily with breakfast. All Day Relief- Gluten-Free Tab.     nitroGLYCERIN 0.4 MG SL tablet  Commonly known as:  NITROSTAT  Place 0.4 mg under the tongue every 5 (five) minutes as needed for chest pain.     omega-3 acid ethyl esters 1 g capsule  Commonly known as:  LOVAZA  Take 2 g by mouth daily.     PARoxetine 30 MG tablet  Commonly known as:  PAXIL  Take 30 mg by mouth daily.     polyvinyl alcohol 1.4 % ophthalmic solution  Commonly known as:  LIQUIFILM TEARS  Place 1 drop into both eyes 4 (four) times daily - after meals and at bedtime.     predniSONE 20 MG tablet  Commonly known as:  DELTASONE  Take 20 mg by mouth daily with breakfast.     ranolazine 500 MG 12 hr tablet  Commonly known as:  RANEXA  Take 1 tablet  (500 mg total) by mouth 2 (two) times daily.     senna 8.6 MG tablet  Commonly known as:  SENOKOT  Take 1 tablet by mouth at bedtime.     traZODone 50 MG tablet  Commonly known as:  DESYREL  Take 75 mg by mouth at bedtime.         Doristine Devoid, RN

## 2015-10-05 NOTE — Progress Notes (Signed)
Attempted to call report to [facility], left message to return call.

## 2015-10-05 NOTE — Progress Notes (Signed)
Patient will DC to: [facility] Anticipated DC date: 10/05/15 Family notified: Daughter Transport by: Sharin MonsPTAR   Per MD patient ready for DC to [facility]. RN, patient, patient's family, and facility notified of DC. RN given number for report. DC packet on chart. Ambulance transport requested for patient.   CSW signing off.  Cristobal GoldmannNadia Hillarie Harrigan, [place]CSWA Clinical Social Worker [phone]

## 2015-10-18 ENCOUNTER — Encounter (HOSPITAL_COMMUNITY): Payer: Self-pay

## 2015-10-18 ENCOUNTER — Ambulatory Visit: Payer: Self-pay | Admitting: Family

## 2015-10-27 ENCOUNTER — Encounter (HOSPITAL_COMMUNITY): Payer: Medicare Other

## 2015-11-03 ENCOUNTER — Ambulatory Visit: Payer: Medicare Other | Admitting: Family

## 2015-11-04 ENCOUNTER — Ambulatory Visit: Payer: Self-pay | Admitting: Cardiovascular Disease

## 2015-12-08 ENCOUNTER — Ambulatory Visit: Payer: Self-pay | Admitting: Nurse Practitioner

## 2015-12-08 ENCOUNTER — Ambulatory Visit: Payer: Medicare Other | Admitting: Nurse Practitioner

## 2015-12-09 ENCOUNTER — Encounter: Payer: Self-pay | Admitting: Nurse Practitioner

## 2015-12-13 ENCOUNTER — Ambulatory Visit: Payer: Medicare Other | Admitting: Nurse Practitioner

## 2015-12-13 ENCOUNTER — Ambulatory Visit (INDEPENDENT_AMBULATORY_CARE_PROVIDER_SITE_OTHER): Payer: Medicare Other | Admitting: Nurse Practitioner

## 2015-12-13 ENCOUNTER — Encounter: Payer: Self-pay | Admitting: Nurse Practitioner

## 2015-12-13 VITALS — BP 179/91 | HR 93 | Ht 60.0 in | Wt 121.0 lb

## 2015-12-13 DIAGNOSIS — I482 Chronic atrial fibrillation, unspecified: Secondary | ICD-10-CM

## 2015-12-13 DIAGNOSIS — I639 Cerebral infarction, unspecified: Secondary | ICD-10-CM | POA: Diagnosis not present

## 2015-12-13 DIAGNOSIS — E785 Hyperlipidemia, unspecified: Secondary | ICD-10-CM

## 2015-12-13 DIAGNOSIS — I10 Essential (primary) hypertension: Secondary | ICD-10-CM

## 2015-12-13 NOTE — Patient Instructions (Addendum)
Stressed the importance of management of risk factors to prevent further stroke Continue eliquis for secondary stroke prevention and atrial fibrillation Continue aspirin .81mg  along with Eliquis Maintain strict control of hypertension with blood pressure goal below 130/90, continue antihypertensive medications Control of diabetes with hemoglobin A1c below 6.5 followed by primary care most recent hemoglobin A1c 8.2 continue diabetic medications Cholesterol with LDL cholesterol less than 70, followed by primary care,  most recent LDL 101 continue statin drugs Eat healthy diet with whole grains,  fresh fruits and vegetables Discussed risk for recurrent stroke/ TIA and answered additional questions Follow-up in 3 months

## 2015-12-13 NOTE — Progress Notes (Addendum)
[facility]  PATIENT: [name] Short DOB: December 31, 1927   REASON FOR VISIT: Hospital follow-up for stroke, prior history of dementia HISTORY FROM: Patient and daughter  [name] is an 82 y.o. female with history of diabetes mellitus, hypertension, coronary artery disease, hyperlipidemia and dementia, brought to the emergency room for evaluation of dizziness which began on the afternoon of 10/01/2015 (LKW 3 PM on 10/01/2015). Patient experienced a fall at the nursing home where she resides. MRI of her brain showed punctate acute infarcts involving the right middle cerebellar peduncle and right periventricular white matter. Patient has been on aspirin and Plavix . No focal weakness has been noted. She's had no change in speech. NIH stroke score at the time of this evaluation was 2 for mental status changes. Patient was not administered IV t-PA secondary to being beyond time window. She was admitted for further evaluation and treatment.She has dementia and is NH resident. She can not remember details, but stated that she had dizziness for 15 min and now resolved. She fell at NH due to dizziness. MRA  unremarkable.Carotid Doppler  unremarkable.2D Echo  EF 55-60%.LDL 101HgbA1c 8.2 She received outpatient therapies for 1 week at a skilled facility in [place] [facility] and then was transferred to her current facility which is a dementia unit. She has not had further stroke or TIA symptoms. She returns for reevaluation  REVIEW OF SYSTEMS: Full 14 system review of systems performed and notable only for those listed, all others are neg:  Constitutional: Fatigue  Cardiovascular: neg Ear/Nose/Throat: neg  Skin: neg Eyes: Blurred vision Respiratory: neg Gastroitestinal: neg  Hematology/Lymphatic: neg  Endocrine: neg Musculoskeletal: Walking difficulty Allergy/Immunology: neg Neurological: History of memory loss Psychiatric: Depression and anxiety Sleep :  neg   ALLERGIES: Allergies  Allergen Reactions  . Codeine Itching and Nausea And Vomiting    HOME MEDICATIONS: Outpatient Medications Prior to Visit  Medication Sig Dispense Refill  . acetaminophen (TYLENOL) 325 MG tablet Take 650 mg by mouth every 4 (four) hours as needed for pain.     Marland Kitchen albuterol (PROVENTIL HFA;VENTOLIN HFA) 108 (90 BASE) MCG/ACT inhaler Inhale 1-2 puffs into the lungs every 6 (six) hours as needed for wheezing. 1 Inhaler 0  . apixaban (ELIQUIS) 2.5 MG TABS tablet Take 1 tablet (2.5 mg total) by mouth 2 (two) times daily. 60 tablet 0  . calcium-vitamin D (OSCAL WITH D) 500-200 MG-UNIT tablet Take 1 tablet by mouth 2 (two) times daily.    . cetirizine (ZYRTEC) 10 MG tablet Take 10 mg by mouth daily.    . diphenhydrAMINE-zinc acetate (BENADRYL) cream Apply 1 application topically 2 (two) times daily.    . famotidine (PEPCID) 20 MG tablet Take 20 mg by mouth daily.    Marland Kitchen HYDROcodone-acetaminophen (NORCO/VICODIN) 5-325 MG per tablet Take 1 tablet by mouth every 6 (six) hours as needed for pain. 40 tablet 0  . Ipratropium-Albuterol (COMBIVENT RESPIMAT) 20-100 MCG/ACT AERS respimat Inhale 1 puff into the lungs every 6 (six) hours as needed for wheezing.    Marland Kitchen levothyroxine (SYNTHROID, LEVOTHROID) 25 MCG tablet Take 37.5 mcg by mouth daily before breakfast.    . loperamide (IMODIUM) 2 MG capsule Take by mouth as needed for diarrhea or loose stools.    Marland Kitchen losartan (COZAAR) 100 MG tablet Take 100 mg by mouth daily.    . metoprolol succinate (TOPROL-XL) 25 MG 24 hr tablet Take 25 mg by mouth at bedtime.    . nitroGLYCERIN (NITROSTAT) 0.4 MG SL  tablet Place 0.4 mg under the tongue every 5 (five) minutes as needed for chest pain.     Marland Kitchen omega-3 acid ethyl esters (LOVAZA) 1 G capsule Take 2 g by mouth daily.    Marland Kitchen PARoxetine (PAXIL) 30 MG tablet Take 30 mg by mouth daily.    . polyvinyl alcohol (LIQUIFILM TEARS) 1.4 % ophthalmic solution Place 1 drop into both eyes 4 (four) times daily -  after meals and at bedtime.    . predniSONE (DELTASONE) 20 MG tablet Take 20 mg by mouth daily with breakfast.    . ranolazine (RANEXA) 500 MG 12 hr tablet Take 1 tablet (500 mg total) by mouth 2 (two) times daily. 60 tablet 5  . senna (SENOKOT) 8.6 MG tablet Take 1 tablet by mouth at bedtime.    . traZODone (DESYREL) 50 MG tablet Take 75 mg by mouth at bedtime.    . furosemide (LASIX) 20 MG tablet Take 20 mg by mouth daily.    Marland Kitchen amoxicillin (AMOXIL) 500 MG capsule Take 2,000 mg by mouth as needed (for dental procedures).     Marland Kitchen apixaban (ELIQUIS) KIT 1 each by Does not apply route once.    Marland Kitchen aspirin EC 81 MG tablet Take 81 mg by mouth daily.    . clopidogrel (PLAVIX) 75 MG tablet Take 75 mg by mouth daily.    Marland Kitchen donepezil (ARICEPT) 5 MG tablet Take 10 mg by mouth at bedtime.     Marland Kitchen ibuprofen (ADVIL,MOTRIN) 800 MG tablet Take 800 mg by mouth every 8 (eight) hours as needed for moderate pain.     . Naproxen Sodium 220 MG CAPS Take 220 mg by mouth daily with breakfast. All Day Relief- Gluten-Free Tab.     No facility-administered medications prior to visit.     PAST MEDICAL HISTORY: Past Medical History:  Diagnosis Date  . Anginal pain ([place])    not in in last month  . Anxiety   . Arthritis    "hands" (10/03/2015)  . Atrial fibrillation ([place])   . CAD (coronary artery disease) 03/01/2008   Lexiscan - EF 79, no ECG changes/EKG negative for ischemia, post-stress EF 86  . Cerebral atherosclerosis 10/15/2011   Extracranial Examination - mild-moderate amount smooth mixed density plaque elevating velocities within the proximal and mid segments of the internal carotid artery  . Chest pain 10/07/2009   2D Echo - EF 50-55, mitral valve calcified annulus  . CHF (congestive heart failure) ([place])   . Chronic bronchitis ([place])   . Claudication ([place]) 09/01/2010   LEA Doppler - Bilateral ABIs-demonstrated moderate arterial insufficiency to the lower extremities at rest, Right CIA/EIA-known occlusive disease,  PV Cath  . Complication of anesthesia    "09/29/2015 put to sleep to take out wisdom tooth; took >12h to get her awake" (10/03/2015  . Coronary artery disease   . Depression   . Dyspnea 06/01/2012   2D Echo - EF 60-65, mitral valve calcified annulus, left atrium severely dilated, right atrium moderately dilated  . GERD (gastroesophageal reflux disease)    takes prevacid  . High cholesterol   . Hypertension   . Hypothyroidism   . Memory loss   . Myocardial infarction ([place])   . PVD (peripheral vascular disease) ([place])   . Stroke [facility]) ?10/02/2015  . Thyroid disease   . Type II diabetes mellitus ([place])   . Vascular dementia     PAST SURGICAL HISTORY: Past Surgical History:  Procedure Laterality Date  . ABDOMINAL HYSTERECTOMY    .  ANGIOPLASTY Left 02/23/2010   Darcon patch, 80-90% stenosis proximal to the anastomosis  . AORTA - BILATERAL FEMORAL ARTERY BYPASS GRAFT  2011  . AXILLARY-FEMORAL BYPASS GRAFT Right 10/08/2012   Procedure: BYPASS GRAFT AXILLA-BIFEMORAL;  Surgeon: Mal Misty, MD;  Location: [facility];  Service: Vascular;  Laterality: Right;  . CARDIAC CATHETERIZATION  2012  . CARDIAC CATHETERIZATION  06/02/2012   Unchanged anatomy compared to 3 years ago, continue medical therapy  . CARDIAC CATHETERIZATION  10/06/2009   Occluded right coronary artery, left-to-right collateral noncritical left disease with occluded grafts  . CATARACT EXTRACTION W/ INTRAOCULAR LENS  IMPLANT, BILATERAL Bilateral   . CORONARY ANGIOPLASTY    . CORONARY ARTERY BYPASS GRAFT  1993  . EYE SURGERY    . JOINT REPLACEMENT    . LEFT HEART CATHETERIZATION WITH CORONARY/GRAFT ANGIOGRAM N/A 06/02/2012   Procedure: LEFT HEART CATHETERIZATION WITH Beatrix Fetters;  Surgeon: Lorretta Harp, MD;  Location[facility] C[facility] LAB;  Service: Cardiovascular;  Laterality: N/A;  . TOTAL KNEE ARTHROPLASTY Left 2010    FAMILY HISTORY: Family History  Problem Relation Age of Onset  . Heart disease Father   .  Hypertension Father   . Heart attack Father   . Kidney failure Brother   . Hyperlipidemia Daughter     SOCIAL HISTORY: Social History   Social History  . Marital status: Widowed    Spouse name: N/A  . Number of children: N/A  . Years of education: N/A   Occupational History  . Not on file.   Social History Main Topics  . Smoking status: Former Smoker    Packs/day: 1.00    Years: 50.00    Types: Cigarettes    Quit date: 05/15/1991  . Smokeless tobacco: Never Used  . Alcohol use No  . Drug use: No  . Sexual activity: No   Other Topics Concern  . Not on file   Social History Narrative  . No narrative on file     PHYSICAL EXAM  Vitals:   12/13/15 1338  BP: (!) 179/91  Pulse: 93  Weight: 121 lb (54.9 kg)  Height: 5' (1.524 m)   Body mass index is 23.63 kg/m.  Generalized: Well developed, in no acute distress  Head: normocephalic and atraumatic,. Oropharynx benign  Neck: Supple, no carotid bruits  Cardiac: Irregularly irregular  rate rhythm,   Musculoskeletal: No deformity   Neurological examination   Mentation: Alert not oriented to time, or place. Language including expression and naming were found to be intact fund of knowledge was impaired  Cranial nerve II-XII: Pupils were equal round reactive to light extraocular movements were full, visual field were full on confrontational test. Facial sensation and strength were normal. hearing was intact to finger rubbing bilaterally. Uvula tongue midline. head turning and shoulder shrug were normal and symmetric.Tongue protrusion into cheek strength was normal. Motor: normal bulk and tone, full strength in the BUE, BLE, fine finger movements normal, no pronator drift. No focal weakness Sensory: normal and symmetric to light touch, pinprick, and  Vibration, Coordination: finger-nose-finger, heel-to-shin bilaterally, no dysmetria. No tremor Reflexes: 1+ upper lower and symmetric plantar responses were flexor  bilaterally. Gait and Station: Rising up from seated position without assistance, slow steady gait with walker   DIAGNOSTIC DATA (LABS, IMAGING, TESTING) - I reviewed patient records, labs, notes, testing and imaging myself where available.  Lab Results  Component Value Date   WBC 9.8 10/05/2015   HGB 9.7 (L) 10/05/2015   HCT 31.2 (  L) 10/05/2015   MCV 91.0 10/05/2015   PLT 219 10/05/2015      Component Value Date/Time   NA 137 10/02/2015 1810   K 3.4 (L) 10/02/2015 1810   CL 99 (L) 10/02/2015 1810   CO2 29 10/02/2015 1810   GLUCOSE 121 (H) 10/02/2015 1810   BUN 11 10/02/2015 1810   CREATININE 1.09 (H) 10/02/2015 1810   CALCIUM 8.9 10/02/2015 1810   PROT 5.8 (L) 12/31/2012 0715   ALBUMIN 3.3 (L) 12/31/2012 0715   AST 15 12/31/2012 0715   ALT 11 12/31/2012 0715   ALKPHOS 90 12/31/2012 0715   BILITOT 0.5 12/31/2012 0715   GFRNONAA 45 (L) 10/02/2015 1810   GFRAA 52 (L) 10/02/2015 1810   Lab Results  Component Value Date   CHOL 149 10/03/2015   HDL 26 (L) 10/03/2015   LDLCALC 101 (H) 10/03/2015   TRIG 111 10/03/2015   CHOLHDL 5.7 10/03/2015   Lab Results  Component Value Date   HGBA1C 8.2 (H) 10/03/2015   No results found for: [id number] Lab Results  Component Value Date   TSH 1.239 10/03/2015      ASSESSMENT AND PLAN  82 y.o. year old female  has a past medical history of ; Stroke ([place]) (10/02/2015);  and Vascular dementia. here to follow-up. The patient is a current patient of Dr. Erlinda Hong who is out of the office today . This note is sent to the work in doctor.     PLAN: Stressed the importance of management of risk factors to prevent further stroke to the daughter Continue eliquis for secondary stroke prevention and atrial fibrillation Continue aspirin .76m along with Eliquis Maintain strict control of hypertension with blood pressure goal below 130/90, continue antihypertensive medications Control of diabetes with hemoglobin A1c below 6.5 followed by primary  care most recent hemoglobin A1c 8.2 continue diabetic medications Cholesterol with LDL cholesterol less than 70, followed by primary care,  most recent LDL 101 continue statin drugs Eat healthy diet with whole grains,  fresh fruits and vegetables Discussed risk for recurrent stroke/ TIA and answered additional questions This was a prolonged visit requiring 40 minutes and medical decision making of high complexity with extensive review of history, hospital chart, counseling and answering questions NDennie Bible [facility] APRN  [facility] [address] [place] [phone] Personally  participated in and made any corrections needed to history, physical, neuro exam,assessment and plan as stated above.   ASarina Ill MD [facility]

## 2016-01-27 ENCOUNTER — Emergency Department (HOSPITAL_COMMUNITY): Payer: Medicare Other

## 2016-01-27 ENCOUNTER — Emergency Department (HOSPITAL_COMMUNITY)
Admission: EM | Admit: 2016-01-27 | Discharge: 2016-01-27 | Disposition: A | Discharge status: Other Acute Inpatient Hospital | Payer: Medicare Other | Attending: Emergency Medicine | Admitting: Emergency Medicine

## 2016-01-27 ENCOUNTER — Encounter (HOSPITAL_COMMUNITY): Payer: Self-pay | Admitting: Emergency Medicine

## 2016-01-27 DIAGNOSIS — I252 Old myocardial infarction: Secondary | ICD-10-CM | POA: Diagnosis not present

## 2016-01-27 DIAGNOSIS — I2510 Atherosclerotic heart disease of native coronary artery without angina pectoris: Secondary | ICD-10-CM | POA: Insufficient documentation

## 2016-01-27 DIAGNOSIS — S0990XA Unspecified injury of head, initial encounter: Secondary | ICD-10-CM | POA: Diagnosis present

## 2016-01-27 DIAGNOSIS — Z7901 Long term (current) use of anticoagulants: Secondary | ICD-10-CM | POA: Diagnosis not present

## 2016-01-27 DIAGNOSIS — Y939 Activity, unspecified: Secondary | ICD-10-CM | POA: Diagnosis not present

## 2016-01-27 DIAGNOSIS — Z96652 Presence of left artificial knee joint: Secondary | ICD-10-CM | POA: Diagnosis not present

## 2016-01-27 DIAGNOSIS — E039 Hypothyroidism, unspecified: Secondary | ICD-10-CM | POA: Insufficient documentation

## 2016-01-27 DIAGNOSIS — I110 Hypertensive heart disease with heart failure: Secondary | ICD-10-CM | POA: Insufficient documentation

## 2016-01-27 DIAGNOSIS — Y999 Unspecified external cause status: Secondary | ICD-10-CM | POA: Diagnosis not present

## 2016-01-27 DIAGNOSIS — Y92129 Unspecified place in nursing home as the place of occurrence of the external cause: Secondary | ICD-10-CM | POA: Diagnosis not present

## 2016-01-27 DIAGNOSIS — E119 Type 2 diabetes mellitus without complications: Secondary | ICD-10-CM | POA: Diagnosis not present

## 2016-01-27 DIAGNOSIS — Z8673 Personal history of transient ischemic attack (TIA), and cerebral infarction without residual deficits: Secondary | ICD-10-CM | POA: Diagnosis not present

## 2016-01-27 DIAGNOSIS — Z951 Presence of aortocoronary bypass graft: Secondary | ICD-10-CM | POA: Insufficient documentation

## 2016-01-27 DIAGNOSIS — Z79899 Other long term (current) drug therapy: Secondary | ICD-10-CM | POA: Insufficient documentation

## 2016-01-27 DIAGNOSIS — W19XXXA Unspecified fall, initial encounter: Secondary | ICD-10-CM

## 2016-01-27 DIAGNOSIS — I509 Heart failure, unspecified: Secondary | ICD-10-CM | POA: Diagnosis not present

## 2016-01-27 DIAGNOSIS — W228XXA Striking against or struck by other objects, initial encounter: Secondary | ICD-10-CM | POA: Diagnosis not present

## 2016-01-27 DIAGNOSIS — Z87891 Personal history of nicotine dependence: Secondary | ICD-10-CM | POA: Insufficient documentation

## 2016-01-27 DIAGNOSIS — Z7982 Long term (current) use of aspirin: Secondary | ICD-10-CM | POA: Diagnosis not present

## 2016-01-27 NOTE — ED Provider Notes (Signed)
[facility]-EMERGENCY DEPT Provider Note   CSN: [id number] Arrival date & time: 01/27/16  1631     History   Chief Complaint Chief Complaint  Patient presents with  . Fall    HPI [name] Short is a 82 y.o. female.  HPI Patient with unwitnessed fall at nursing home. Reportedly the patient has Alzheimer's dementia. The patient reports remembering her fall. Per her, she was reaching for the shower to get up and slipped falling forward and hitting her head. She denies she is having any headache. She denies pain in any other areas. She is denying feeling of weakness or numbness in her extremities. Past Medical History:  Diagnosis Date  . Anginal pain (HCC)    not in in last month  . Anxiety   . Arthritis    "hands" (10/03/2015)  . Atrial fibrillation (HCC)   . CAD (coronary artery disease) 03/01/2008   Lexiscan - EF 79, no ECG changes/EKG negative for ischemia, post-stress EF 86  . Cerebral atherosclerosis 10/15/2011   Extracranial Examination - mild-moderate amount smooth mixed density plaque elevating velocities within the proximal and mid segments of the internal carotid artery  . Chest pain 10/07/2009   2D Echo - EF 50-55, mitral valve calcified annulus  . CHF (congestive heart failure) (HCC)   . Chronic bronchitis (HCC)   . Claudication (HCC) 09/01/2010   LEA Doppler - Bilateral ABIs-demonstrated moderate arterial insufficiency to the lower extremities at rest, Right CIA/EIA-known occlusive disease, PV Cath  . Complication of anesthesia    "09/29/2015 put to sleep to take out wisdom tooth; took >12h to get her awake" (10/03/2015  . Coronary artery disease   . Depression   . Dyspnea 06/01/2012   2D Echo - EF 60-65, mitral valve calcified annulus, left atrium severely dilated, right atrium moderately dilated  . GERD (gastroesophageal reflux disease)    takes prevacid  . High cholesterol   . Hypertension   . Hypothyroidism   . Memory loss   . Myocardial infarction (HCC)   . PVD  (peripheral vascular disease) (HCC)   . Stroke [facility]) ?10/02/2015  . Thyroid disease   . Type II diabetes mellitus (HCC)   . Vascular dementia     Patient Active Problem List   Diagnosis Date Noted  . Acute CVA (cerebrovascular accident) (HCC) 10/03/2015  . Cerebellar stroke (HCC)   . Paroxysmal atrial fibrillation (HCC)   . HLD (hyperlipidemia)   . Stroke (HCC) 10/02/2015  . Acute embolic stroke (HCC) 10/02/2015  . Stroke (cerebrum) (HCC) 10/02/2015  . Acute respiratory failure with hypoxia (HCC) 10/02/2015  . Essential hypertension 03/12/2013  . Atherosclerosis of native arteries of the extremities with intermittent claudication 11/04/2012  . Preoperative clearance 09/25/2012  . CAD (coronary artery disease) of artery bypass graft 09/25/2012  . Peripheral vascular disease, unspecified (HCC) 09/23/2012  . Aftercare following surgery of the circulatory system, NEC 09/23/2012  . Chest pain 08/11/2012  . Chronic atrial fibrillation- ventricular rate is controlled 08/11/2012  . Dyslipidemia 06/03/2012  . PVD (peripheral vascular disease), AOBF '94 06/03/2012  . NSVT, EF 60-65% by 2D this admission, beta blocker increased 06/03/2012  . CAD, CABG X 2 '93- Last cath 05/2012- no change from 2011, medical Rx- EF of 60-65% via echo 05/2012 06/03/2012  . CHF (congestive heart failure) - mild, BNP 3K 05/31/2012  . Unstable angina (HCC) 05/31/2012  . Diabetes mellitus type II, controlled (HCC) 05/31/2012  . Atrial fibrillation with controlled ventricular response (HCC) 05/31/2012    Past  Surgical History:  Procedure Laterality Date  . ABDOMINAL HYSTERECTOMY    . ANGIOPLASTY Left 02/23/2010   Darcon patch, 80-90% stenosis proximal to the anastomosis  . AORTA - BILATERAL FEMORAL ARTERY BYPASS GRAFT  2011  . AXILLARY-FEMORAL BYPASS GRAFT Right 10/08/2012   Procedure: BYPASS GRAFT AXILLA-BIFEMORAL;  Surgeon: Pryor Ochoa, MD;  Location: [facility] OR;  Service: Vascular;  Laterality: Right;  .  CARDIAC CATHETERIZATION  2012  . CARDIAC CATHETERIZATION  06/02/2012   Unchanged anatomy compared to 3 years ago, continue medical therapy  . CARDIAC CATHETERIZATION  10/06/2009   Occluded right coronary artery, left-to-right collateral noncritical left disease with occluded grafts  . CATARACT EXTRACTION W/ INTRAOCULAR LENS  IMPLANT, BILATERAL Bilateral   . CORONARY ANGIOPLASTY    . CORONARY ARTERY BYPASS GRAFT  1993  . EYE SURGERY    . JOINT REPLACEMENT    . LEFT HEART CATHETERIZATION WITH CORONARY/GRAFT ANGIOGRAM N/A 06/02/2012   Procedure: LEFT HEART CATHETERIZATION WITH Isabel Caprice;  Surgeon: Runell Gess, MD;  Location: [facility] CATH LAB;  Service: Cardiovascular;  Laterality: N/A;  . TOTAL KNEE ARTHROPLASTY Left 2010    OB History    No data available       Home Medications    Prior to Admission medications   Medication Sig Start Date End Date Taking? Authorizing Provider  acetaminophen (TYLENOL) 325 MG tablet Take 650 mg by mouth every 4 (four) hours as needed for pain.     Historical Provider, MD  albuterol (PROVENTIL HFA;VENTOLIN HFA) 108 (90 BASE) MCG/ACT inhaler Inhale 1-2 puffs into the lungs every 6 (six) hours as needed for wheezing. 12/31/12   Marlon Pel, PA-C  apixaban (ELIQUIS) 2.5 MG TABS tablet Take 1 tablet (2.5 mg total) by mouth 2 (two) times daily. 10/05/15   Rhetta Mura, MD  aspirin 81 MG tablet Take 81 mg by mouth daily.    Historical Provider, MD  calcium-vitamin D (OSCAL WITH D) 500-200 MG-UNIT tablet Take 1 tablet by mouth 2 (two) times daily.    Historical Provider, MD  cetirizine (ZYRTEC) 10 MG tablet Take 10 mg by mouth daily.    Historical Provider, MD  diphenhydrAMINE-zinc acetate (BENADRYL) cream Apply 1 application topically 2 (two) times daily.    Historical Provider, MD  famotidine (PEPCID) 20 MG tablet Take 20 mg by mouth daily.    Historical Provider, MD  furosemide (LASIX) 20 MG tablet Take 20 mg by mouth daily. 10/01/15  10/04/15  Historical Provider, MD  guaifenesin (ROBITUSSIN) 100 MG/5ML syrup Take 200 mg by mouth 4 (four) times daily as needed for cough.    Historical Provider, MD  HYDROcodone-acetaminophen (NORCO/VICODIN) 5-325 MG per tablet Take 1 tablet by mouth every 6 (six) hours as needed for pain. 10/13/12   Lars Mage, PA-C  Ipratropium-Albuterol (COMBIVENT RESPIMAT) 20-100 MCG/ACT AERS respimat Inhale 1 puff into the lungs every 6 (six) hours as needed for wheezing.    Historical Provider, MD  levothyroxine (SYNTHROID, LEVOTHROID) 25 MCG tablet Take 37.5 mcg by mouth daily before breakfast.    Historical Provider, MD  loperamide (IMODIUM) 2 MG capsule Take by mouth as needed for diarrhea or loose stools.    Historical Provider, MD  losartan (COZAAR) 100 MG tablet Take 100 mg by mouth daily.    Historical Provider, MD  magnesium hydroxide (MILK OF MAGNESIA) 400 MG/5ML suspension Take by mouth daily as needed for mild constipation.    Historical Provider, MD  metoprolol succinate (TOPROL-XL) 25 MG 24 hr tablet  Take 25 mg by mouth at bedtime.    Historical Provider, MD  nitroGLYCERIN (NITROSTAT) 0.4 MG SL tablet Place 0.4 mg under the tongue every 5 (five) minutes as needed for chest pain.     Historical Provider, MD  omega-3 acid ethyl esters (LOVAZA) 1 G capsule Take 2 g by mouth daily.    Historical Provider, MD  PARoxetine (PAXIL) 30 MG tablet Take 30 mg by mouth daily.    Historical Provider, MD  polyvinyl alcohol (LIQUIFILM TEARS) 1.4 % ophthalmic solution Place 1 drop into both eyes 4 (four) times daily - after meals and at bedtime.    Historical Provider, MD  potassium chloride SA (K-DUR,KLOR-CON) 20 MEQ tablet Take 20 mEq by mouth daily.    Historical Provider, MD  predniSONE (DELTASONE) 20 MG tablet Take 20 mg by mouth daily with breakfast.    Historical Provider, MD  ranolazine (RANEXA) 500 MG 12 hr tablet Take 1 tablet (500 mg total) by mouth 2 (two) times daily. 08/12/12   Brittainy Sherlynn Carbon,  PA-C  rivastigmine (EXELON) 1.5 MG capsule Take 1.5 mg by mouth 2 (two) times daily.    Historical Provider, MD  senna (SENOKOT) 8.6 MG tablet Take 1 tablet by mouth at bedtime.    Historical Provider, MD  simvastatin (ZOCOR) 10 MG tablet Take 10 mg by mouth daily.    Historical Provider, MD  traZODone (DESYREL) 50 MG tablet Take 75 mg by mouth at bedtime.    Historical Provider, MD    Family History Family History  Problem Relation Age of Onset  . Heart disease Father   . Hypertension Father   . Heart attack Father   . Kidney failure Brother   . Hyperlipidemia Daughter     Social History Social History  Substance Use Topics  . Smoking status: Former Smoker    Packs/day: 1.00    Years: 50.00    Types: Cigarettes    Quit date: 05/15/1991  . Smokeless tobacco: Never Used  . Alcohol use No     Allergies   Codeine   Review of Systems Review of Systems 10 Systems reviewed and are negative for acute change except as noted in the HPI.  Physical Exam Updated Vital Signs BP 117/78   Pulse 99   Temp 98.3 F (36.8 C) (Oral)   Resp 18   SpO2 91%   Physical Exam  Constitutional: She appears well-developed and well-nourished. No distress.  Patient is in no apparent distress. She is alert and pleasant.  HENT:  Head: Normocephalic and atraumatic.  Right Ear: External ear normal.  Left Ear: External ear normal.  Nose: Nose normal.  Mouth/Throat: Oropharynx is clear and moist.  Eyes: Conjunctivae and EOM are normal. Pupils are equal, round, and reactive to light.  Neck: Neck supple.  Endorses mod tenderness to palpation midline c-spine C3 to C6  Cardiovascular: Normal rate.   No murmur heard. Irregularly irregular  Pulmonary/Chest: Effort normal and breath sounds normal. No respiratory distress. She exhibits no tenderness.  Chest wall nontender to compression.  Abdominal: Soft. There is no tenderness.  Musculoskeletal: Normal range of motion. She exhibits no edema,  tenderness or deformity.  Patient has some baseline stiffness in the right knee due to prior surgery. She otherwise does not have pain with range of motion at the hips. No deformities at the knees or ankles. Range of motion bilateral upper extremities without pain.  Neurological: She is alert. No cranial nerve deficit. She exhibits normal muscle tone.  Coordination normal.  Skin: Skin is warm and dry.  Psychiatric: She has a normal mood and affect.  Nursing note and vitals reviewed.    ED Treatments / Results  Labs (all labs ordered are listed, but only abnormal results are displayed) Labs Reviewed - No data to display  EKG  EKG Interpretation None       Radiology Ct Head Wo Contrast  Result Date: 01/27/2016 CLINICAL DATA:  Fall with neck pain. Patient on anticoagulation medication. No loss of consciousness. EXAM: CT HEAD WITHOUT CONTRAST CT CERVICAL SPINE WITHOUT CONTRAST TECHNIQUE: Multidetector CT imaging of the head and cervical spine was performed following the standard protocol without intravenous contrast. Multiplanar CT image reconstructions of the cervical spine were also generated. COMPARISON:  10/02/2015 FINDINGS: CT HEAD FINDINGS Brain: The ventricles, cisterns and other CSF spaces are within normal. There is chronic ischemic microvascular disease present. There is no mass, mass effect, shift of midline structures or acute hemorrhage. No evidence of acute infarction. Vascular: Calcified plaque over the cavernous and petrous segments of the internal carotid arteries. Skull: Within normal. Sinuses/Orbits: Hypoplastic frontal sinuses. Sinuses are otherwise clear. Mastoid air cells are clear. Orbits are within normal. CT CERVICAL SPINE FINDINGS Alignment: Within normal. Skull base and vertebrae: Mild spondylosis throughout the cervical spine. Moderate uncovertebral joint spurring and mild facet arthropathy. No evidence of acute fracture. Calcification of the transverse ligament  posterior to the dens. Neural foraminal narrowing at multiple levels. Soft tissues and spinal canal: Prevertebral soft tissues are within normal. Spinal canal unremarkable. Disc levels:  Disc space narrowing at the C6-7 level. Upper chest: Unremarkable. IMPRESSION: No acute intracranial findings. Chronic ischemic microvascular disease. No acute cervical spine injury. Mild spondylosis of the cervical spine with disc disease at the C6-7 level. Multilevel neural foraminal narrowing. Electronically Signed   By: Elberta Fortis M.D.   On: 01/27/2016 20:17   Ct Cervical Spine Wo Contrast  Result Date: 01/27/2016 CLINICAL DATA:  Fall with neck pain. Patient on anticoagulation medication. No loss of consciousness. EXAM: CT HEAD WITHOUT CONTRAST CT CERVICAL SPINE WITHOUT CONTRAST TECHNIQUE: Multidetector CT imaging of the head and cervical spine was performed following the standard protocol without intravenous contrast. Multiplanar CT image reconstructions of the cervical spine were also generated. COMPARISON:  10/02/2015 FINDINGS: CT HEAD FINDINGS Brain: The ventricles, cisterns and other CSF spaces are within normal. There is chronic ischemic microvascular disease present. There is no mass, mass effect, shift of midline structures or acute hemorrhage. No evidence of acute infarction. Vascular: Calcified plaque over the cavernous and petrous segments of the internal carotid arteries. Skull: Within normal. Sinuses/Orbits: Hypoplastic frontal sinuses. Sinuses are otherwise clear. Mastoid air cells are clear. Orbits are within normal. CT CERVICAL SPINE FINDINGS Alignment: Within normal. Skull base and vertebrae: Mild spondylosis throughout the cervical spine. Moderate uncovertebral joint spurring and mild facet arthropathy. No evidence of acute fracture. Calcification of the transverse ligament posterior to the dens. Neural foraminal narrowing at multiple levels. Soft tissues and spinal canal: Prevertebral soft tissues are  within normal. Spinal canal unremarkable. Disc levels:  Disc space narrowing at the C6-7 level. Upper chest: Unremarkable. IMPRESSION: No acute intracranial findings. Chronic ischemic microvascular disease. No acute cervical spine injury. Mild spondylosis of the cervical spine with disc disease at the C6-7 level. Multilevel neural foraminal narrowing. Electronically Signed   By: Elberta Fortis M.D.   On: 01/27/2016 20:17    Procedures Procedures (including critical care time)  Medications Ordered in ED Medications - No  data to display   Initial Impression / Assessment and Plan / ED Course  I have reviewed the triage vital signs and the nursing notes.  Pertinent labs & imaging results that were available during my care of the patient were reviewed by me and considered in my medical decision making (see chart for details).  Clinical Course    Final Clinical Impressions(s) / ED Diagnoses   Final diagnoses:  Fall, initial encounter  Minor head injury, initial encounter  Patient has remained alert. She does have history of dementia. She has followed commands appropriately. No evidence of focal neurologic deficit. CT head and cervical spine do not reveal acute injury. Patient will be return to nursing home care for ongoing observation.  New Prescriptions New Prescriptions   No medications on file     Arby BarretteMarcy Cody Oliger, MD 01/27/16 2057

## 2016-01-27 NOTE — ED Notes (Signed)
Due to pt status placed bed alarm on stretcher.

## 2016-01-27 NOTE — ED Triage Notes (Signed)
[facility]itness[facility] with unknown downtime.  Patient states she slipped and fell on her bottom, states she hit her head on the way down.  Patient moves all extremities without difficulty.

## 2016-01-27 NOTE — ED Notes (Signed)
Called report to [facility]. Spoke with Jeoffrey Massedyrann, nurse

## 2016-01-31 ENCOUNTER — Emergency Department (HOSPITAL_COMMUNITY): Payer: Medicare Other

## 2016-01-31 ENCOUNTER — Emergency Department (HOSPITAL_COMMUNITY)
Admission: EM | Admit: 2016-01-31 | Discharge: 2016-01-31 | Disposition: A | Discharge status: Home / Self Care | Payer: Medicare Other | Attending: Emergency Medicine | Admitting: Emergency Medicine

## 2016-01-31 ENCOUNTER — Encounter (HOSPITAL_COMMUNITY): Payer: Self-pay | Admitting: Emergency Medicine

## 2016-01-31 DIAGNOSIS — I509 Heart failure, unspecified: Secondary | ICD-10-CM | POA: Diagnosis not present

## 2016-01-31 DIAGNOSIS — S0990XA Unspecified injury of head, initial encounter: Secondary | ICD-10-CM | POA: Diagnosis present

## 2016-01-31 DIAGNOSIS — Z79899 Other long term (current) drug therapy: Secondary | ICD-10-CM | POA: Diagnosis not present

## 2016-01-31 DIAGNOSIS — Y9289 Other specified places as the place of occurrence of the external cause: Secondary | ICD-10-CM | POA: Insufficient documentation

## 2016-01-31 DIAGNOSIS — Z87891 Personal history of nicotine dependence: Secondary | ICD-10-CM | POA: Diagnosis not present

## 2016-01-31 DIAGNOSIS — W19XXXA Unspecified fall, initial encounter: Secondary | ICD-10-CM | POA: Diagnosis not present

## 2016-01-31 DIAGNOSIS — E119 Type 2 diabetes mellitus without complications: Secondary | ICD-10-CM | POA: Diagnosis not present

## 2016-01-31 DIAGNOSIS — Y999 Unspecified external cause status: Secondary | ICD-10-CM | POA: Diagnosis not present

## 2016-01-31 DIAGNOSIS — Y939 Activity, unspecified: Secondary | ICD-10-CM | POA: Diagnosis not present

## 2016-01-31 DIAGNOSIS — I2510 Atherosclerotic heart disease of native coronary artery without angina pectoris: Secondary | ICD-10-CM | POA: Insufficient documentation

## 2016-01-31 DIAGNOSIS — I252 Old myocardial infarction: Secondary | ICD-10-CM | POA: Diagnosis not present

## 2016-01-31 DIAGNOSIS — E039 Hypothyroidism, unspecified: Secondary | ICD-10-CM | POA: Diagnosis not present

## 2016-01-31 DIAGNOSIS — Z7951 Long term (current) use of inhaled steroids: Secondary | ICD-10-CM | POA: Insufficient documentation

## 2016-01-31 DIAGNOSIS — Z7982 Long term (current) use of aspirin: Secondary | ICD-10-CM | POA: Insufficient documentation

## 2016-01-31 DIAGNOSIS — I110 Hypertensive heart disease with heart failure: Secondary | ICD-10-CM | POA: Insufficient documentation

## 2016-01-31 DIAGNOSIS — S0083XA Contusion of other part of head, initial encounter: Secondary | ICD-10-CM | POA: Insufficient documentation

## 2016-01-31 NOTE — ED Provider Notes (Signed)
[facility]-EMERGENCY DEPT Provider Note   CSN: [id number] Arrival date & time: 01/31/16  0107  By signing my name below, I, [name] Short, attest that this documentation has been prepared under the direction and in the presence of Francine Hannan PA-C. Electronically Signed: Vista Minkobert Short, ED Scribe. 01/31/16. 1:53 AM.  History   Chief Complaint Chief Complaint  Patient presents with  . Fall    HPI HPI Comments: [name] Short is a 82 y.o. female with a Hx of stroke who presents to the Emergency Department s/p a fall that occurred yesterday morning. Per [facility], pt was outside at her living facility and fell into a bush. Her living facility became aware of the fall yesterday morning but states they were too busy at the time to send her to the ED. Pt has bruising noted to right forehead. She also had a recent fall on 01/27/16. When asked, pt does not remember the details of the fall. 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 Pt eventually says she just doesn't remember. No other known injuries or complaints of pain. Pt currently taking Eliquis.   The history is provided by the patient and the nursing home. No language interpreter was used.    Past Medical History:  Diagnosis Date  . Anginal pain (HCC)    not in in last month  . Anxiety   . Arthritis    "hands" (10/03/2015)  . Atrial fibrillation (HCC)   . CAD (coronary artery disease) 03/01/2008   Lexiscan - EF 79, no ECG changes/EKG negative for ischemia, post-stress EF 86  . Cerebral atherosclerosis 10/15/2011   Extracranial Examination - mild-moderate amount smooth mixed density plaque elevating velocities within the proximal and mid segments of the internal carotid artery  . Chest pain 10/07/2009   2D Echo - EF 50-55, mitral valve calcified annulus  . CHF (congestive heart failure) (HCC)   . Chronic bronchitis (HCC)   . Claudication (HCC) 09/01/2010   LEA Doppler - Bilateral ABIs-demonstrated moderate  arterial insufficiency to the lower extremities at rest, Right CIA/EIA-known occlusive disease, PV Cath  . Complication of anesthesia    "09/29/2015 put to sleep to take out wisdom tooth; took >12h to get her awake" (10/03/2015  . Coronary artery disease   . Depression   . Dyspnea 06/01/2012   2D Echo - EF 60-65, mitral valve calcified annulus, left atrium severely dilated, right atrium moderately dilated  . GERD (gastroesophageal reflux disease)    takes prevacid  . High cholesterol   . Hypertension   . Hypothyroidism   . Memory loss   . Myocardial infarction (HCC)   . PVD (peripheral vascular disease) (HCC)   . Stroke [facility](HCC) ?10/02/2015  . Thyroid disease   . Type II diabetes mellitus (HCC)   . Vascular dementia     Patient Active Problem List   Diagnosis Date Noted  . Acute CVA (cerebrovascular accident) (HCC) 10/03/2015  . Cerebellar stroke (HCC)   . Paroxysmal atrial fibrillation (HCC)   . HLD (hyperlipidemia)   . Stroke (HCC) 10/02/2015  . Acute embolic stroke (HCC) 10/02/2015  . Stroke (cerebrum) (HCC) 10/02/2015  . Acute respiratory failure with hypoxia (HCC) 10/02/2015  . Essential hypertension 03/12/2013  . Atherosclerosis of native arteries of the extremities with intermittent claudication 11/04/2012  . Preoperative clearance 09/25/2012  . CAD (coronary artery disease) of artery bypass graft 09/25/2012  . Peripheral vascular disease, unspecified (HCC) 09/23/2012  . Aftercare following surgery  of the circulatory system, NEC 09/23/2012  . Chest pain 08/11/2012  . Chronic atrial fibrillation- ventricular rate is controlled 08/11/2012  . Dyslipidemia 06/03/2012  . PVD (peripheral vascular disease), AOBF '94 06/03/2012  . NSVT, EF 60-65% by 2D this admission, beta blocker increased 06/03/2012  . CAD, CABG X 2 '93- Last cath 05/2012- no change from 2011, medical Rx- EF of 60-65% via echo 05/2012 06/03/2012  . CHF (congestive heart failure) - mild, BNP 3K 05/31/2012  .  Unstable angina (HCC) 05/31/2012  . Diabetes mellitus type II, controlled (HCC) 05/31/2012  . Atrial fibrillation with controlled ventricular response (HCC) 05/31/2012    Past Surgical History:  Procedure Laterality Date  . ABDOMINAL HYSTERECTOMY    . ANGIOPLASTY Left 02/23/2010   Darcon patch, 80-90% stenosis proximal to the anastomosis  . AORTA - BILATERAL FEMORAL ARTERY BYPASS GRAFT  2011  . AXILLARY-FEMORAL BYPASS GRAFT Right 10/08/2012   Procedure: BYPASS GRAFT AXILLA-BIFEMORAL;  Surgeon: Pryor Ochoa, MD;  Location: [facility] OR;  Service: Vascular;  Laterality: Right;  . CARDIAC CATHETERIZATION  2012  . CARDIAC CATHETERIZATION  06/02/2012   Unchanged anatomy compared to 3 years ago, continue medical therapy  . CARDIAC CATHETERIZATION  10/06/2009   Occluded right coronary artery, left-to-right collateral noncritical left disease with occluded grafts  . CATARACT EXTRACTION W/ INTRAOCULAR LENS  IMPLANT, BILATERAL Bilateral   . CORONARY ANGIOPLASTY    . CORONARY ARTERY BYPASS GRAFT  1993  . EYE SURGERY    . JOINT REPLACEMENT    . LEFT HEART CATHETERIZATION WITH CORONARY/GRAFT ANGIOGRAM N/A 06/02/2012   Procedure: LEFT HEART CATHETERIZATION WITH Isabel Caprice;  Surgeon: Runell Gess, MD;  Location: [facility] CATH LAB;  Service: Cardiovascular;  Laterality: N/A;  . TOTAL KNEE ARTHROPLASTY Left 2010    OB History    No data available       Home Medications    Prior to Admission medications   Medication Sig Start Date End Date Taking? Authorizing Provider  acetaminophen (TYLENOL) 325 MG tablet Take 650 mg by mouth 4 (four) times daily.    Yes Historical Provider, MD  acetaminophen (TYLENOL) 500 MG tablet Take 500 mg by mouth every 4 (four) hours as needed for mild pain, moderate pain, fever or headache.   Yes Historical Provider, MD  albuterol (PROVENTIL HFA;VENTOLIN HFA) 108 (90 BASE) MCG/ACT inhaler Inhale 1-2 puffs into the lungs every 6 (six) hours as needed for wheezing.  12/31/12  Yes Marlon Pel, PA-C  alum & mag hydroxide-simeth (MINTOX) 200-200-20 MG/5ML suspension Take 30 mLs by mouth every 6 (six) hours as needed for indigestion or heartburn.   Yes Historical Provider, MD  apixaban (ELIQUIS) 2.5 MG TABS tablet Take 1 tablet (2.5 mg total) by mouth 2 (two) times daily. 10/05/15  Yes Rhetta Mura, MD  aspirin EC 81 MG tablet Take 81 mg by mouth daily.   Yes Historical Provider, MD  calcium-vitamin D (OSCAL WITH D) 500-200 MG-UNIT tablet Take 1 tablet by mouth 2 (two) times daily.   Yes Historical Provider, MD  cetirizine (ZYRTEC) 10 MG tablet Take 10 mg by mouth daily.   Yes Historical Provider, MD  diphenhydrAMINE-zinc acetate (BENADRYL) cream Apply 1 application topically 2 (two) times daily.   Yes Historical Provider, MD  famotidine (PEPCID) 20 MG tablet Take 20 mg by mouth daily.   Yes Historical Provider, MD  furosemide (LASIX) 20 MG tablet Take 20 mg by mouth daily.   Yes Historical Provider, MD  guaifenesin (ROBITUSSIN) 100 MG/5ML syrup  Take 200 mg by mouth every 6 (six) hours as needed for cough.    Yes Historical Provider, MD  Ipratropium-Albuterol (COMBIVENT RESPIMAT) 20-100 MCG/ACT AERS respimat Inhale 1 puff into the lungs every 6 (six) hours as needed for wheezing or shortness of breath.    Yes Historical Provider, MD  levothyroxine (SYNTHROID, LEVOTHROID) 25 MCG tablet Take 37.5 mcg by mouth daily before breakfast.   Yes Historical Provider, MD  loperamide (IMODIUM) 2 MG capsule Take 2 mg by mouth as needed for diarrhea or loose stools.    Yes Historical Provider, MD  losartan (COZAAR) 100 MG tablet Take 100 mg by mouth daily.   Yes Historical Provider, MD  magnesium hydroxide (MILK OF MAGNESIA) 400 MG/5ML suspension Take 30 mLs by mouth at bedtime as needed for mild constipation.    Yes Historical Provider, MD  metoprolol succinate (TOPROL-XL) 25 MG 24 hr tablet Take 25 mg by mouth at bedtime.   Yes Historical Provider, MD    Neomycin-Bacitracin-Polymyxin (TRIPLE ANTIBIOTIC) [phone] OINT Apply 1 application topically as needed (for minor skin tears/abrasions).   Yes Historical Provider, MD  nitroGLYCERIN (NITROSTAT) 0.4 MG SL tablet Place 0.4 mg under the tongue every 5 (five) minutes as needed for chest pain.    Yes Historical Provider, MD  omega-3 acid ethyl esters (LOVAZA) 1 G capsule Take 2 g by mouth daily.   Yes Historical Provider, MD  PARoxetine (PAXIL) 30 MG tablet Take 30 mg by mouth daily.   Yes Historical Provider, MD  polyvinyl alcohol (LIQUIFILM TEARS) 1.4 % ophthalmic solution Place 1 drop into both eyes 4 (four) times daily.    Yes Historical Provider, MD  potassium chloride SA (K-DUR,KLOR-CON) 20 MEQ tablet Take 20 mEq by mouth daily.   Yes Historical Provider, MD  ranolazine (RANEXA) 500 MG 12 hr tablet Take 1 tablet (500 mg total) by mouth 2 (two) times daily. 08/12/12  Yes Brittainy Sherlynn Carbon, PA-C  rivastigmine (EXELON) 4.6 mg/24hr Place 4.6 mg onto the skin daily.   Yes Historical Provider, MD  senna (SENOKOT) 8.6 MG tablet Take 1 tablet by mouth at bedtime.   Yes Historical Provider, MD  simvastatin (ZOCOR) 10 MG tablet Take 10 mg by mouth at bedtime.    Yes Historical Provider, MD  traZODone (DESYREL) 50 MG tablet Take 75 mg by mouth at bedtime.   Yes Historical Provider, MD    Family History Family History  Problem Relation Age of Onset  . Heart disease Father   . Hypertension Father   . Heart attack Father   . Kidney failure Brother   . Hyperlipidemia Daughter     Social History Social History  Substance Use Topics  . Smoking status: Former Smoker    Packs/day: 1.00    Years: 50.00    Types: Cigarettes    Quit date: 05/15/1991  . Smokeless tobacco: Never Used  . Alcohol use No     Allergies   Codeine   Review of Systems Review of Systems  Constitutional: Negative for fever.  Skin: Positive for color change (brusing to right forehead).  All other systems reviewed and  are negative.    Physical Exam Updated Vital Signs BP 145/89 (BP Location: Left Arm)   Pulse 91   Temp 98.8 F (37.1 C) (Oral)   Resp 18   SpO2 96%   Physical Exam  Constitutional: She appears well-developed and well-nourished.  HENT:  Head: Normocephalic. Head is with contusion. Head is without raccoon's eyes, without Battle's sign,  without abrasion, without laceration, without right periorbital erythema and without left periorbital erythema. Hair is normal.    Right Ear: Tympanic membrane normal.  Left Ear: Tympanic membrane normal.  Nose: Nose normal.  Eyes: Conjunctivae are normal. Pupils are equal, round, and reactive to light.  Neck: Trachea normal, normal range of motion and full passive range of motion without pain. Neck supple.  Cardiovascular: Normal rate, regular rhythm and normal pulses.   Pulmonary/Chest: Effort normal and breath sounds normal. Chest wall is not dull to percussion. She exhibits no tenderness, no crepitus, no edema, no deformity and no retraction.  Abdominal: Soft. Normal appearance and bowel sounds are normal.  Musculoskeletal: Normal range of motion.  Neurological: She is alert. She has normal strength.  Awake and alert. Responds to verbal questions.  Skin: Skin is warm, dry and intact.  Psychiatric: She has a normal mood and affect. Her speech is normal and behavior is normal.     ED Treatments / Results  DIAGNOSTIC STUDIES: Oxygen Saturation is 96% on RA, normal by my interpretation.  COORDINATION OF CARE: 1:51 AM-Will order CT head. Discussed treatment plan with pt at bedside and pt agreed to plan.   Labs (all labs ordered are listed, but only abnormal results are displayed) Labs Reviewed - No data to display  EKG  EKG Interpretation None       Radiology Ct Head Wo Contrast  Result Date: 01/31/2016 CLINICAL DATA:  Unwitnessed fall.  Head injury. EXAM: CT HEAD WITHOUT CONTRAST CT CERVICAL SPINE WITHOUT CONTRAST TECHNIQUE:  Multidetector CT imaging of the head and cervical spine was performed following the standard protocol without intravenous contrast. Multiplanar CT image reconstructions of the cervical spine were also generated. COMPARISON:  01/27/2016 FINDINGS: CT HEAD FINDINGS Brain: No evidence of acute infarction, hemorrhage, hydrocephalus, extra-axial collection or mass lesion/mass effect. Diffuse cerebral atrophy. Ventricular dilatation consistent with central atrophy. Low-attenuation changes in the deep white matter consistent small vessel ischemia. Vascular: Atherosclerotic vascular calcifications are present. Skull: Normal. Negative for fracture or focal lesion. Sinuses/Orbits: No acute finding. Other: Subcutaneous scalp hematoma over the right anterior frontal region. CT CERVICAL SPINE FINDINGS Alignment: Normal. Skull base and vertebrae: Diffuse degenerative change throughout the cervical spine with narrowed interspaces and endplate hypertrophic changes throughout. No vertebral compression deformities. Soft tissues and spinal canal: No prevertebral fluid or swelling. No visible canal hematoma. Disc levels: Diffuse degenerative changes. Prominent posterior osteophytes cause impression upon the central canal at C4-5, C6-7, and C7-T1 levels. Upper chest: Interstitial changes in the lung apices likely due to fibrosis. Other: None. IMPRESSION: No acute intracranial abnormalities. Chronic atrophy and small vessel ischemic changes. Normal alignment of the cervical spine. Diffuse degenerative changes. No acute displaced fractures identified. Electronically Signed   By: Burman Nieves M.D.   On: 01/31/2016 03:19   Ct Cervical Spine Wo Contrast  Result Date: 01/31/2016 CLINICAL DATA:  Unwitnessed fall.  Head injury. EXAM: CT HEAD WITHOUT CONTRAST CT CERVICAL SPINE WITHOUT CONTRAST TECHNIQUE: Multidetector CT imaging of the head and cervical spine was performed following the standard protocol without intravenous contrast.  Multiplanar CT image reconstructions of the cervical spine were also generated. COMPARISON:  01/27/2016 FINDINGS: CT HEAD FINDINGS Brain: No evidence of acute infarction, hemorrhage, hydrocephalus, extra-axial collection or mass lesion/mass effect. Diffuse cerebral atrophy. Ventricular dilatation consistent with central atrophy. Low-attenuation changes in the deep white matter consistent small vessel ischemia. Vascular: Atherosclerotic vascular calcifications are present. Skull: Normal. Negative for fracture or focal lesion. Sinuses/Orbits: No acute finding. Other:  Subcutaneous scalp hematoma over the right anterior frontal region. CT CERVICAL SPINE FINDINGS Alignment: Normal. Skull base and vertebrae: Diffuse degenerative change throughout the cervical spine with narrowed interspaces and endplate hypertrophic changes throughout. No vertebral compression deformities. Soft tissues and spinal canal: No prevertebral fluid or swelling. No visible canal hematoma. Disc levels: Diffuse degenerative changes. Prominent posterior osteophytes cause impression upon the central canal at C4-5, C6-7, and C7-T1 levels. Upper chest: Interstitial changes in the lung apices likely due to fibrosis. Other: None. IMPRESSION: No acute intracranial abnormalities. Chronic atrophy and small vessel ischemic changes. Normal alignment of the cervical spine. Diffuse degenerative changes. No acute displaced fractures identified. Electronically Signed   By: Burman Nieves M.D.   On: 01/31/2016 03:19    Procedures Procedures (including critical care time)  Medications Ordered in ED Medications - No data to display   Initial Impression / Assessment and Plan / ED Course  I have reviewed the triage vital signs and the nursing notes.  Pertinent labs & imaging results that were available during my care of the patient were reviewed by me and considered in my medical decision making (see chart for details).  Clinical Course   Negative  head and neck CT's for acute injury. Discussed case with Dr. Judd Lien prior to discharge. She does not require wound repair, she will be sent back to her living facility.   Final Clinical Impressions(s) / ED Diagnoses   Final diagnoses:  Facial contusion, initial encounter    New Prescriptions New Prescriptions   No medications on file    I personally performed the services described in this documentation, which was scribed in my presence. The recorded information has been reviewed and is accurate.    Marlon Pel, PA-C 01/31/16 1610    Geoffery Lyons, MD 01/31/16 [phone]

## 2016-01-31 NOTE — Discharge Instructions (Signed)
Ms. [name] SharpsMary Short had a CT of her head and neck both of which are normal at todays visit.

## 2016-01-31 NOTE — ED Triage Notes (Signed)
[facility] yesterday morning. Patient states she was out back at the facility and fell in the bushes. [facility] reports facility became aware of it yesterday morning but stated they were to busy at the time to send her out. Patient has busing to Rt frontal lobe. Unsure if its from this fall of a recent fall on 9/15. No other known injuries of complaints.

## 2016-01-31 NOTE — ED Notes (Signed)
Transport arrangements made with [facility]. [facility] in regards to Ms. Hurlbut.

## 2016-03-13 ENCOUNTER — Ambulatory Visit: Payer: Medicare Other | Admitting: Nurse Practitioner

## 2016-03-20 ENCOUNTER — Emergency Department (HOSPITAL_COMMUNITY): Payer: Medicare Other

## 2016-03-20 ENCOUNTER — Encounter (HOSPITAL_COMMUNITY): Payer: Self-pay | Admitting: Emergency Medicine

## 2016-03-20 ENCOUNTER — Emergency Department (HOSPITAL_COMMUNITY)
Admission: EM | Admit: 2016-03-20 | Discharge: 2016-03-21 | Disposition: A | Discharge status: Home / Self Care | Payer: Medicare Other | Attending: Emergency Medicine | Admitting: Emergency Medicine

## 2016-03-20 DIAGNOSIS — I509 Heart failure, unspecified: Secondary | ICD-10-CM | POA: Insufficient documentation

## 2016-03-20 DIAGNOSIS — Y939 Activity, unspecified: Secondary | ICD-10-CM | POA: Diagnosis not present

## 2016-03-20 DIAGNOSIS — Y999 Unspecified external cause status: Secondary | ICD-10-CM | POA: Diagnosis not present

## 2016-03-20 DIAGNOSIS — I252 Old myocardial infarction: Secondary | ICD-10-CM | POA: Diagnosis not present

## 2016-03-20 DIAGNOSIS — E119 Type 2 diabetes mellitus without complications: Secondary | ICD-10-CM | POA: Insufficient documentation

## 2016-03-20 DIAGNOSIS — Z79899 Other long term (current) drug therapy: Secondary | ICD-10-CM | POA: Insufficient documentation

## 2016-03-20 DIAGNOSIS — Z7982 Long term (current) use of aspirin: Secondary | ICD-10-CM | POA: Insufficient documentation

## 2016-03-20 DIAGNOSIS — S32019A Unspecified fracture of first lumbar vertebra, initial encounter for closed fracture: Secondary | ICD-10-CM | POA: Insufficient documentation

## 2016-03-20 DIAGNOSIS — Y929 Unspecified place or not applicable: Secondary | ICD-10-CM | POA: Insufficient documentation

## 2016-03-20 DIAGNOSIS — Z7901 Long term (current) use of anticoagulants: Secondary | ICD-10-CM | POA: Diagnosis not present

## 2016-03-20 DIAGNOSIS — S32029A Unspecified fracture of second lumbar vertebra, initial encounter for closed fracture: Secondary | ICD-10-CM | POA: Diagnosis not present

## 2016-03-20 DIAGNOSIS — I2510 Atherosclerotic heart disease of native coronary artery without angina pectoris: Secondary | ICD-10-CM | POA: Insufficient documentation

## 2016-03-20 DIAGNOSIS — S32009A Unspecified fracture of unspecified lumbar vertebra, initial encounter for closed fracture: Secondary | ICD-10-CM

## 2016-03-20 DIAGNOSIS — I110 Hypertensive heart disease with heart failure: Secondary | ICD-10-CM | POA: Insufficient documentation

## 2016-03-20 DIAGNOSIS — Z87891 Personal history of nicotine dependence: Secondary | ICD-10-CM | POA: Diagnosis not present

## 2016-03-20 DIAGNOSIS — F0390 Unspecified dementia without behavioral disturbance: Secondary | ICD-10-CM | POA: Diagnosis not present

## 2016-03-20 DIAGNOSIS — Z955 Presence of coronary angioplasty implant and graft: Secondary | ICD-10-CM | POA: Diagnosis not present

## 2016-03-20 DIAGNOSIS — W19XXXA Unspecified fall, initial encounter: Secondary | ICD-10-CM

## 2016-03-20 DIAGNOSIS — W1830XA Fall on same level, unspecified, initial encounter: Secondary | ICD-10-CM | POA: Diagnosis not present

## 2016-03-20 DIAGNOSIS — Z96652 Presence of left artificial knee joint: Secondary | ICD-10-CM | POA: Insufficient documentation

## 2016-03-20 DIAGNOSIS — E039 Hypothyroidism, unspecified: Secondary | ICD-10-CM | POA: Insufficient documentation

## 2016-03-20 DIAGNOSIS — Z8673 Personal history of transient ischemic attack (TIA), and cerebral infarction without residual deficits: Secondary | ICD-10-CM | POA: Insufficient documentation

## 2016-03-20 DIAGNOSIS — S3992XA Unspecified injury of lower back, initial encounter: Secondary | ICD-10-CM | POA: Diagnosis present

## 2016-03-20 LAB — CBG MONITORING, ED: Glucose-Capillary: 154 mg/dL — ABNORMAL HIGH (ref 65–99)

## 2016-03-20 MED ORDER — HYDROCODONE-ACETAMINOPHEN 5-325 MG PO TABS: ORAL_TABLET | ORAL | 0 refills | Status: DC

## 2016-03-20 NOTE — ED Notes (Signed)
Called [employer] - requested lumbar corsett for L1 L2 facture. Phone number [phone] Weight 121ib Height 5'4 Providers ward and [facility]

## 2016-03-20 NOTE — ED Notes (Signed)
Back in room

## 2016-03-20 NOTE — ED Notes (Signed)
Bed: [id number] Expected date:  Expected time:  Means of arrival:  Comments: 82 yo unwitnessed fall, no injury

## 2016-03-20 NOTE — ED Triage Notes (Signed)
Pt comes to ed, via ems, c/o fall unwitnessed 45 mins ago, hx of falls, past bruises. Pt is blood thinners, no LOC or not aware. Pupils perrla, comes from [facility], [facility], Full code, hx of diabetes, cbg 318  V/s bp 144/96, hr 80, rr 16, 96 room air. Hx dementia, at baseline currently, alert person and occasional place.

## 2016-03-20 NOTE — ED Provider Notes (Signed)
[facility]-EMERGENCY DEPT Provider Note   CSN: [id number] Arrival date & time: 03/20/16  2033     History   Chief Complaint Chief Complaint  Patient presents with  . Fall    unwitness fall 45 min     HPI [name] Short is a 82 y.o. female.  The history is provided by the patient and medical records. No language interpreter was used.  Fall   [name] Short is a 82 y.o. female  with a PMH of dementia, CHF, HTN, HLD, afib, prior stroke on Eliquis who presents to the Emergency Department from [facility] for an unwitnessed fall. Per facility, patient was at baseline at a routine check. 45 minutes later, staff went to check on patient and noticed that she had fallen. She was conscious and at her baseline mental status when found. She does state that her back hurts and pain is worse when she moves or coughs, but is unable to give any further information about events leading up to fall or symptoms at this time. Per nursing facility, patient is at baseline mental status alert to person and occasionally alert to place.  Level V caveat applies due to dementia.  Past Medical History:  Diagnosis Date  . Anginal pain (HCC)    not in in last month  . Anxiety   . Arthritis    "hands" (10/03/2015)  . Atrial fibrillation (HCC)   . CAD (coronary artery disease) 03/01/2008   Lexiscan - EF 79, no ECG changes/EKG negative for ischemia, post-stress EF 86  . Cerebral atherosclerosis 10/15/2011   Extracranial Examination - mild-moderate amount smooth mixed density plaque elevating velocities within the proximal and mid segments of the internal carotid artery  . Chest pain 10/07/2009   2D Echo - EF 50-55, mitral valve calcified annulus  . CHF (congestive heart failure) (HCC)   . Chronic bronchitis (HCC)   . Claudication (HCC) 09/01/2010   LEA Doppler - Bilateral ABIs-demonstrated moderate arterial insufficiency to the lower extremities at rest, Right CIA/EIA-known occlusive disease, PV Cath  . Complication of  anesthesia    "09/29/2015 put to sleep to take out wisdom tooth; took >12h to get her awake" (10/03/2015  . Coronary artery disease   . Depression   . Dyspnea 06/01/2012   2D Echo - EF 60-65, mitral valve calcified annulus, left atrium severely dilated, right atrium moderately dilated  . GERD (gastroesophageal reflux disease)    takes prevacid  . High cholesterol   . Hypertension   . Hypothyroidism   . Memory loss   . Myocardial infarction   . PVD (peripheral vascular disease) (HCC)   . Stroke [facility]) ?10/02/2015  . Thyroid disease   . Type II diabetes mellitus (HCC)   . Vascular dementia     Patient Active Problem List   Diagnosis Date Noted  . Acute CVA (cerebrovascular accident) (HCC) 10/03/2015  . Cerebellar stroke (HCC)   . Paroxysmal atrial fibrillation (HCC)   . HLD (hyperlipidemia)   . Stroke (HCC) 10/02/2015  . Acute embolic stroke (HCC) 10/02/2015  . Stroke (cerebrum) (HCC) 10/02/2015  . Acute respiratory failure with hypoxia (HCC) 10/02/2015  . Essential hypertension 03/12/2013  . Atherosclerosis of native arteries of the extremities with intermittent claudication 11/04/2012  . Preoperative clearance 09/25/2012  . CAD (coronary artery disease) of artery bypass graft 09/25/2012  . Peripheral vascular disease, unspecified 09/23/2012  . Aftercare following surgery of the circulatory system, NEC 09/23/2012  . Chest pain 08/11/2012  . Chronic atrial fibrillation- ventricular rate  is controlled 08/11/2012  . Dyslipidemia 06/03/2012  . PVD (peripheral vascular disease), AOBF '94 06/03/2012  . NSVT, EF 60-65% by 2D this admission, beta blocker increased 06/03/2012  . CAD, CABG X 2 '93- Last cath 05/2012- no change from 2011, medical Rx- EF of 60-65% via echo 05/2012 06/03/2012  . CHF (congestive heart failure) - mild, BNP 3K 05/31/2012  . Unstable angina (HCC) 05/31/2012  . Diabetes mellitus type II, controlled (HCC) 05/31/2012  . Atrial fibrillation with controlled  ventricular response (HCC) 05/31/2012    Past Surgical History:  Procedure Laterality Date  . ABDOMINAL HYSTERECTOMY    . ANGIOPLASTY Left 02/23/2010   Darcon patch, 80-90% stenosis proximal to the anastomosis  . AORTA - BILATERAL FEMORAL ARTERY BYPASS GRAFT  2011  . AXILLARY-FEMORAL BYPASS GRAFT Right 10/08/2012   Procedure: BYPASS GRAFT AXILLA-BIFEMORAL;  Surgeon: Pryor Ochoa, MD;  Location: [facility] OR;  Service: Vascular;  Laterality: Right;  . CARDIAC CATHETERIZATION  2012  . CARDIAC CATHETERIZATION  06/02/2012   Unchanged anatomy compared to 3 years ago, continue medical therapy  . CARDIAC CATHETERIZATION  10/06/2009   Occluded right coronary artery, left-to-right collateral noncritical left disease with occluded grafts  . CATARACT EXTRACTION W/ INTRAOCULAR LENS  IMPLANT, BILATERAL Bilateral   . CORONARY ANGIOPLASTY    . CORONARY ARTERY BYPASS GRAFT  1993  . EYE SURGERY    . JOINT REPLACEMENT    . LEFT HEART CATHETERIZATION WITH CORONARY/GRAFT ANGIOGRAM N/A 06/02/2012   Procedure: LEFT HEART CATHETERIZATION WITH Isabel Caprice;  Surgeon: Runell Gess, MD;  Location: [facility] CATH LAB;  Service: Cardiovascular;  Laterality: N/A;  . TOTAL KNEE ARTHROPLASTY Left 2010    OB History    No data available       Home Medications    Prior to Admission medications   Medication Sig Start Date End Date Taking? Authorizing Provider  acetaminophen (TYLENOL) 325 MG tablet Take 650 mg by mouth 4 (four) times daily.    Yes Historical Provider, MD  acetaminophen (TYLENOL) 500 MG tablet Take 500 mg by mouth every 4 (four) hours as needed for mild pain, moderate pain, fever or headache.   Yes Historical Provider, MD  albuterol (PROVENTIL HFA;VENTOLIN HFA) 108 (90 BASE) MCG/ACT inhaler Inhale 1-2 puffs into the lungs every 6 (six) hours as needed for wheezing. 12/31/12  Yes Marlon Pel, PA-C  alum & mag hydroxide-simeth (MINTOX) 200-200-20 MG/5ML suspension Take 30 mLs by mouth every 6  (six) hours as needed for indigestion or heartburn.   Yes Historical Provider, MD  apixaban (ELIQUIS) 2.5 MG TABS tablet Take 1 tablet (2.5 mg total) by mouth 2 (two) times daily. 10/05/15  Yes Rhetta Mura, MD  aspirin EC 81 MG tablet Take 81 mg by mouth daily.   Yes Historical Provider, MD  busPIRone (BUSPAR) 15 MG tablet Take 15 mg by mouth 2 (two) times daily.   Yes Historical Provider, MD  calcium-vitamin D (OSCAL WITH D) 500-200 MG-UNIT tablet Take 1 tablet by mouth 2 (two) times daily.   Yes Historical Provider, MD  cetirizine (ZYRTEC) 10 MG tablet Take 10 mg by mouth daily.   Yes Historical Provider, MD  diphenhydrAMINE-zinc acetate (BENADRYL) cream Apply 1 application topically 2 (two) times daily.   Yes Historical Provider, MD  famotidine (PEPCID) 20 MG tablet Take 20 mg by mouth daily.   Yes Historical Provider, MD  furosemide (LASIX) 20 MG tablet Take 20 mg by mouth daily.   Yes Historical Provider, MD  guaifenesin (ROBITUSSIN)  100 MG/5ML syrup Take 200 mg by mouth every 6 (six) hours as needed for cough.    Yes Historical Provider, MD  Ipratropium-Albuterol (COMBIVENT RESPIMAT) 20-100 MCG/ACT AERS respimat Inhale 1 puff into the lungs every 6 (six) hours as needed for wheezing or shortness of breath.    Yes Historical Provider, MD  levothyroxine (SYNTHROID, LEVOTHROID) 25 MCG tablet Take 37.5 mcg by mouth daily before breakfast.   Yes Historical Provider, MD  loperamide (IMODIUM) 2 MG capsule Take 2 mg by mouth as needed for diarrhea or loose stools.    Yes Historical Provider, MD  losartan (COZAAR) 100 MG tablet Take 100 mg by mouth daily.   Yes Historical Provider, MD  magnesium hydroxide (MILK OF MAGNESIA) 400 MG/5ML suspension Take 30 mLs by mouth at bedtime as needed for mild constipation.    Yes Historical Provider, MD  metoprolol succinate (TOPROL-XL) 25 MG 24 hr tablet Take 25 mg by mouth at bedtime.   Yes Historical Provider, MD  Neomycin-Bacitracin-Polymyxin (TRIPLE  ANTIBIOTIC) [phone] OINT Apply 1 application topically as needed (for minor skin tears/abrasions).   Yes Historical Provider, MD  nitroGLYCERIN (NITROSTAT) 0.4 MG SL tablet Place 0.4 mg under the tongue every 5 (five) minutes as needed for chest pain.    Yes Historical Provider, MD  omega-3 acid ethyl esters (LOVAZA) 1 G capsule Take 2 g by mouth daily.   Yes Historical Provider, MD  PARoxetine (PAXIL) 30 MG tablet Take 30 mg by mouth daily.   Yes Historical Provider, MD  polyvinyl alcohol (LIQUIFILM TEARS) 1.4 % ophthalmic solution Place 1 drop into both eyes 4 (four) times daily.    Yes Historical Provider, MD  potassium chloride SA (K-DUR,KLOR-CON) 20 MEQ tablet Take 20 mEq by mouth daily.   Yes Historical Provider, MD  ranolazine (RANEXA) 500 MG 12 hr tablet Take 1 tablet (500 mg total) by mouth 2 (two) times daily. 08/12/12  Yes Brittainy Sherlynn CarbonM Simmons, PA-C  rivastigmine (EXELON) 4.6 mg/24hr Place 4.6 mg onto the skin daily.   Yes Historical Provider, MD  senna (SENOKOT) 8.6 MG tablet Take 1 tablet by mouth at bedtime.   Yes Historical Provider, MD  simvastatin (ZOCOR) 10 MG tablet Take 10 mg by mouth at bedtime.    Yes Historical Provider, MD  traZODone (DESYREL) 50 MG tablet Take 75 mg by mouth at bedtime.   Yes Historical Provider, MD  HYDROcodone-acetaminophen (NORCO/VICODIN) 5-325 MG tablet Take 0.5 to 1 tablet by mouth every 6 (six) hours as needed for severe pain. 03/20/16   Chase PicketJaime Pilcher Ward, PA-C    Family History Family History  Problem Relation Age of Onset  . Heart disease Father   . Hypertension Father   . Heart attack Father   . Kidney failure Brother   . Hyperlipidemia Daughter     Social History Social History  Substance Use Topics  . Smoking status: Former Smoker    Packs/day: 1.00    Years: 50.00    Types: Cigarettes    Quit date: 05/15/1991  . Smokeless tobacco: Never Used  . Alcohol use No     Allergies   Codeine   Review of Systems Review of Systems    Unable to perform ROS: Dementia  Musculoskeletal: Positive for back pain.     Physical Exam Updated Vital Signs BP 142/88   Pulse 74   Temp 98 F (36.7 C) (Oral)   Resp 16   SpO2 95%   Physical Exam  Constitutional: She appears well-developed and  well-nourished. No distress.  HENT:  Head: Normocephalic and atraumatic. Head is without raccoon's eyes, without Battle's sign and without laceration.  Right Ear: No hemotympanum.  Left Ear: No hemotympanum.  Nose: Nose normal.  Neck: Neck supple. No tracheal deviation present.  No midline or paraspinal tenderness of C spine.   Cardiovascular:  No murmur heard. Irregularly irregular, history of afib.  2+ distal pulses in all four extremities.   Pulmonary/Chest: Effort normal and breath sounds normal. No respiratory distress. She has no wheezes. She has no rales. She exhibits no tenderness.  Abdominal: Soft. Bowel sounds are normal. She exhibits no distension. There is no tenderness.  Musculoskeletal:  TTP of thoracic spine. Able to move all four extremities with no difficulty. No tenderness to palpation of hips/pelvis.   Neurological:  Alert to person only which is baseline per nursing facility. Able to follow simple commands.  Skin: Skin is warm and dry.     ED Treatments / Results  Labs (all labs ordered are listed, but only abnormal results are displayed) Labs Reviewed  CBG MONITORING, ED - Abnormal; Notable for the following:       Result Value   Glucose-Capillary 154 (*)    All other components within normal limits    EKG  EKG Interpretation  Date/Time:  Tuesday March 20 2016 21:48:18 EST Ventricular Rate:  101 PR Interval:    QRS Duration: 96 QT Interval:  360 QTC Calculation: 467 R Axis:   107 Text Interpretation:  Atrial fibrillation Right axis deviation Borderline repolarization abnormality no significant change since May 2017 Confirmed by Criss AlvineGOLDSTON MD, SCOTT [phone]([id number]) on 03/20/2016 10:25:09 PM        Radiology Dg Chest 2 View  Result Date: 03/20/2016 CLINICAL DATA:  Recent fall with chest pain, initial encounter EXAM: CHEST  2 VIEW COMPARISON:  10/02/2015 FINDINGS: Cardiac shadow is enlarged. Postsurgical changes are again seen and stable. The lungs are well aerated bilaterally. No sizable effusion is seen. No acute bony abnormality is noted. IMPRESSION: No active cardiopulmonary disease. Electronically Signed   By: Alcide CleverMark  Lukens M.D.   On: 03/20/2016 21:43   Ct Head Wo Contrast  Result Date: 03/20/2016 CLINICAL DATA:  Unwitnessed fall 45 minutes ago. Patient on blood thinners. No loss of consciousness. EXAM: CT HEAD WITHOUT CONTRAST CT CERVICAL SPINE WITHOUT CONTRAST TECHNIQUE: Multidetector CT imaging of the head and cervical spine was performed following the standard protocol without intravenous contrast. Multiplanar CT image reconstructions of the cervical spine were also generated. COMPARISON:  01/31/2016 FINDINGS: CT HEAD FINDINGS Brain: Moderate central atrophy. Chronic small vessel ischemic disease of periventricular white matter. No acute large vessel territory infarction. No intracranial hemorrhage, midline shift or edema. No extra-axial fluid collections. Vascular: No hyperdense vessels. Moderate atherosclerotic calcifications of the carotid siphons and both vertebral arteries. Skull: No acute fracture bone destruction. Sinuses/Orbits: No acute finding. Other: Mild forehead soft tissue swelling. CT CERVICAL SPINE FINDINGS Alignment: The craniocervical relationship is maintained. Atlantodental mobile is within normal limits. Calcification of the transverse ligament is seen about the odontoid PEG. Skull base and vertebrae: No acute fracture of the cervical vertebrae. No acute skull base abnormalities Soft tissues and spinal canal: No prevertebral soft tissue swelling. No significant central canal stenosis. Disc levels: Slight disc space narrowing with small posterior marginal osteophytes at  C4-5 and C6-7 contributing to mild bilateral neural foraminal encroachment. Right facet joint space narrowing and hypertrophy from C3 through C5 and on the left at C6-7. Upper chest: Atherosclerosis of the  aortic arch. Median sternotomy sutures are noted. No pneumothorax or pulmonary consolidation. Stable mild interstitial fibrosis. No apical mass. Other: None IMPRESSION: No acute intracranial abnormality. Chronic small vessel ischemic disease. No acute cervical spinal abnormality. Chronic degenerative disc disease C4-5 and C6-7. Bilateral facet arthropathy on the right C3 through C5 and on left at C6-7. Electronically Signed   By: Tollie Eth M.D.   On: 03/20/2016 21:54   Ct Cervical Spine Wo Contrast  Result Date: 03/20/2016 CLINICAL DATA:  Unwitnessed fall 45 minutes ago. Patient on blood thinners. No loss of consciousness. EXAM: CT HEAD WITHOUT CONTRAST CT CERVICAL SPINE WITHOUT CONTRAST TECHNIQUE: Multidetector CT imaging of the head and cervical spine was performed following the standard protocol without intravenous contrast. Multiplanar CT image reconstructions of the cervical spine were also generated. COMPARISON:  01/31/2016 FINDINGS: CT HEAD FINDINGS Brain: Moderate central atrophy. Chronic small vessel ischemic disease of periventricular white matter. No acute large vessel territory infarction. No intracranial hemorrhage, midline shift or edema. No extra-axial fluid collections. Vascular: No hyperdense vessels. Moderate atherosclerotic calcifications of the carotid siphons and both vertebral arteries. Skull: No acute fracture bone destruction. Sinuses/Orbits: No acute finding. Other: Mild forehead soft tissue swelling. CT CERVICAL SPINE FINDINGS Alignment: The craniocervical relationship is maintained. Atlantodental mobile is within normal limits. Calcification of the transverse ligament is seen about the odontoid PEG. Skull base and vertebrae: No acute fracture of the cervical vertebrae. No acute  skull base abnormalities Soft tissues and spinal canal: No prevertebral soft tissue swelling. No significant central canal stenosis. Disc levels: Slight disc space narrowing with small posterior marginal osteophytes at C4-5 and C6-7 contributing to mild bilateral neural foraminal encroachment. Right facet joint space narrowing and hypertrophy from C3 through C5 and on the left at C6-7. Upper chest: Atherosclerosis of the aortic arch. Median sternotomy sutures are noted. No pneumothorax or pulmonary consolidation. Stable mild interstitial fibrosis. No apical mass. Other: None IMPRESSION: No acute intracranial abnormality. Chronic small vessel ischemic disease. No acute cervical spinal abnormality. Chronic degenerative disc disease C4-5 and C6-7. Bilateral facet arthropathy on the right C3 through C5 and on left at C6-7. Electronically Signed   By: Tollie Eth M.D.   On: 03/20/2016 21:54   Ct Thoracic Spine Wo Contrast  Result Date: 03/20/2016 CLINICAL DATA:  Unwitnessed fall EXAM: CT THORACIC SPINE WITHOUT CONTRAST TECHNIQUE: Multidetector CT imaging of the thoracic spine was performed without intravenous contrast administration. Multiplanar CT image reconstructions were also generated. COMPARISON:  10/03/2015, chest x-ray 03/20/2016 FINDINGS: Alignment: Thoracic spine alignment is within normal limits. The examination is limited by motion artifact. Vertebrae: Vertebral body heights are grossly maintained. There are no retropulsed fracture fragments into the spinal canal. There are slightly displaced transverse process fractures on the left side at L1 and L2. Paraspinal and other soft tissues: There is dense atherosclerotic calcification. There are small bilateral pleural effusions. No paraspinal or paravertebral soft tissue abnormality is visualized. Disc levels: Multilevel degenerative disc changes with endplate changes, multiple vacuum disc, and osteophytes. IMPRESSION: 1. Mildly displaced left transverse  process fractures at L1 and L2. 2. Thoracic spine alignment grossly within normal limits. No compression fracture deformities allowing for patient motion. Electronically Signed   By: Jasmine Pang M.D.   On: 03/20/2016 22:08    Procedures Procedures (including critical care time)  Medications Ordered in ED Medications - No data to display   Initial Impression / Assessment and Plan / ED Course  I have reviewed the triage vital signs and the  nursing notes.  Pertinent labs & imaging results that were available during my care of the patient were reviewed by me and considered in my medical decision making (see chart for details).  Clinical Course    [name] Short is a 82 y.o. female who presents to ED for Evaluation after unwitnessed fall. On exam, patient appears to be at her baseline mental status (alert and oriented to self only, able to follow simple commands). Vital signs were reassuring. She does have tenderness to palpation of the lower thoracic spine. CT head and cervical spine reviewed with no acute abnormalities. CT does show mildly displaced left transverse process fractures at L1 and L2.   11:13 PM - Discussed imaging with neurosurgery, Dr. Conchita Paris, who recommends lumbar corsett and symptomatic treatment.   Lumbar corsett applied in ED for comfort per neurosurgery recommendations. Symptomatic home care instructions included in discharge paperwork for nursing facility. Follow up with neurosurgery outpatient.  Patient seen by and discussed with Dr. Criss Alvine who agrees with treatment plan.   Final Clinical Impressions(s) / ED Diagnoses   Final diagnoses:  Fall  Closed fracture of transverse process of lumbar vertebra, initial encounter (HCC)    New Prescriptions New Prescriptions   HYDROCODONE-ACETAMINOPHEN (NORCO/VICODIN) 5-325 MG TABLET    Take 0.5 to 1 tablet by mouth every 6 (six) hours as needed for severe pain.     [facility] Ward, PA-C 03/21/16 [id number]    Pricilla Loveless, MD 03/21/16 [id number]

## 2016-03-20 NOTE — ED Notes (Signed)
In Ct and xray

## 2016-03-20 NOTE — Discharge Instructions (Signed)
You had a mildly displaced fracture of your transverse process at L1 and L2 (vertebrate in your lower back). We have placed you in a special brace for comfort in the ER today. Please follow up with the neurosurgeon listed. This is an injury that typically does not need surgical intervention. If needed, take norco for pain.

## 2016-03-21 DIAGNOSIS — S32019A Unspecified fracture of first lumbar vertebra, initial encounter for closed fracture: Secondary | ICD-10-CM | POA: Diagnosis not present

## 2016-03-21 MED ORDER — HYDROCODONE-ACETAMINOPHEN 5-325 MG PO TABS
1.0000 | ORAL_TABLET | Freq: Once | ORAL | Status: AC
  Administered 2016-03-21: 1 via ORAL
  Filled 2016-03-21: qty 1

## 2016-03-21 NOTE — ED Notes (Signed)
[facility]

## 2016-03-22 ENCOUNTER — Ambulatory Visit: Payer: Medicare Other | Admitting: Nurse Practitioner

## 2016-04-02 ENCOUNTER — Emergency Department (HOSPITAL_COMMUNITY)
Admission: EM | Admit: 2016-04-02 | Discharge: 2016-04-03 | Disposition: A | Discharge status: Home / Self Care | Payer: Medicare Other | Attending: Emergency Medicine | Admitting: Emergency Medicine

## 2016-04-02 ENCOUNTER — Emergency Department (HOSPITAL_COMMUNITY): Payer: Medicare Other

## 2016-04-02 ENCOUNTER — Encounter (HOSPITAL_COMMUNITY): Payer: Self-pay

## 2016-04-02 DIAGNOSIS — Z951 Presence of aortocoronary bypass graft: Secondary | ICD-10-CM | POA: Diagnosis not present

## 2016-04-02 DIAGNOSIS — S299XXA Unspecified injury of thorax, initial encounter: Secondary | ICD-10-CM | POA: Diagnosis present

## 2016-04-02 DIAGNOSIS — Z87891 Personal history of nicotine dependence: Secondary | ICD-10-CM | POA: Insufficient documentation

## 2016-04-02 DIAGNOSIS — Y939 Activity, unspecified: Secondary | ICD-10-CM | POA: Insufficient documentation

## 2016-04-02 DIAGNOSIS — S60512A Abrasion of left hand, initial encounter: Secondary | ICD-10-CM | POA: Diagnosis not present

## 2016-04-02 DIAGNOSIS — R29810 Facial weakness: Secondary | ICD-10-CM | POA: Insufficient documentation

## 2016-04-02 DIAGNOSIS — Z79899 Other long term (current) drug therapy: Secondary | ICD-10-CM | POA: Diagnosis not present

## 2016-04-02 DIAGNOSIS — E119 Type 2 diabetes mellitus without complications: Secondary | ICD-10-CM | POA: Insufficient documentation

## 2016-04-02 DIAGNOSIS — M542 Cervicalgia: Secondary | ICD-10-CM | POA: Diagnosis not present

## 2016-04-02 DIAGNOSIS — Z8673 Personal history of transient ischemic attack (TIA), and cerebral infarction without residual deficits: Secondary | ICD-10-CM | POA: Insufficient documentation

## 2016-04-02 DIAGNOSIS — I2510 Atherosclerotic heart disease of native coronary artery without angina pectoris: Secondary | ICD-10-CM | POA: Diagnosis not present

## 2016-04-02 DIAGNOSIS — Z96652 Presence of left artificial knee joint: Secondary | ICD-10-CM | POA: Insufficient documentation

## 2016-04-02 DIAGNOSIS — S8002XA Contusion of left knee, initial encounter: Secondary | ICD-10-CM | POA: Diagnosis not present

## 2016-04-02 DIAGNOSIS — I110 Hypertensive heart disease with heart failure: Secondary | ICD-10-CM | POA: Diagnosis not present

## 2016-04-02 DIAGNOSIS — S60511A Abrasion of right hand, initial encounter: Secondary | ICD-10-CM | POA: Diagnosis not present

## 2016-04-02 DIAGNOSIS — R935 Abnormal findings on diagnostic imaging of other abdominal regions, including retroperitoneum: Secondary | ICD-10-CM | POA: Diagnosis not present

## 2016-04-02 DIAGNOSIS — S2232XA Fracture of one rib, left side, initial encounter for closed fracture: Secondary | ICD-10-CM | POA: Insufficient documentation

## 2016-04-02 DIAGNOSIS — I509 Heart failure, unspecified: Secondary | ICD-10-CM | POA: Diagnosis not present

## 2016-04-02 DIAGNOSIS — Z7982 Long term (current) use of aspirin: Secondary | ICD-10-CM | POA: Diagnosis not present

## 2016-04-02 DIAGNOSIS — Z23 Encounter for immunization: Secondary | ICD-10-CM | POA: Insufficient documentation

## 2016-04-02 DIAGNOSIS — S0083XA Contusion of other part of head, initial encounter: Secondary | ICD-10-CM | POA: Diagnosis not present

## 2016-04-02 DIAGNOSIS — I252 Old myocardial infarction: Secondary | ICD-10-CM | POA: Insufficient documentation

## 2016-04-02 DIAGNOSIS — S32019A Unspecified fracture of first lumbar vertebra, initial encounter for closed fracture: Secondary | ICD-10-CM | POA: Insufficient documentation

## 2016-04-02 DIAGNOSIS — S301XXA Contusion of abdominal wall, initial encounter: Secondary | ICD-10-CM | POA: Diagnosis not present

## 2016-04-02 DIAGNOSIS — E039 Hypothyroidism, unspecified: Secondary | ICD-10-CM | POA: Insufficient documentation

## 2016-04-02 DIAGNOSIS — W050XXA Fall from non-moving wheelchair, initial encounter: Secondary | ICD-10-CM | POA: Diagnosis not present

## 2016-04-02 DIAGNOSIS — Y929 Unspecified place or not applicable: Secondary | ICD-10-CM | POA: Insufficient documentation

## 2016-04-02 DIAGNOSIS — S32009A Unspecified fracture of unspecified lumbar vertebra, initial encounter for closed fracture: Secondary | ICD-10-CM

## 2016-04-02 DIAGNOSIS — S32029A Unspecified fracture of second lumbar vertebra, initial encounter for closed fracture: Secondary | ICD-10-CM | POA: Diagnosis not present

## 2016-04-02 DIAGNOSIS — Y999 Unspecified external cause status: Secondary | ICD-10-CM | POA: Diagnosis not present

## 2016-04-02 MED ORDER — TETANUS-DIPHTH-ACELL PERTUSSIS 5-2.5-18.5 LF-MCG/0.5 IM SUSP
0.5000 mL | Freq: Once | INTRAMUSCULAR | Status: AC
  Administered 2016-04-03: 0.5 mL via INTRAMUSCULAR
  Filled 2016-04-02: qty 0.5

## 2016-04-02 MED ORDER — SODIUM CHLORIDE 0.9 % IV BOLUS (SEPSIS)
500.0000 mL | Freq: Once | INTRAVENOUS | Status: AC
  Administered 2016-04-03: 500 mL via INTRAVENOUS

## 2016-04-02 NOTE — ED Notes (Signed)
EDP at bedside  

## 2016-04-02 NOTE — ED Provider Notes (Addendum)
[facility]-EMERGENCY DEPT Provider Note   CSN: [id number] Arrival date & time: 04/02/16  2256  By signing my name below, I, Phillis Haggis, attest that this documentation has been prepared under the direction and in the presence of Tomasita Crumble, MD. Electronically Signed: Phillis Haggis, ED Scribe. 04/02/16. 11:12 PM.  History   Chief Complaint Chief Complaint  Patient presents with  . Fall  . Neck Pain   The history is provided by the EMS personnel. No language interpreter was used.   HPI Comments (Level 5 Caveat due to dementia): [name] Short is a 82 y.o. female with a hx of A-Fib, vascular dementia, Type II DM, CAD, stroke, and MI brought in by EMS from [facility] nursing home who presents to the Emergency Department complaining of a fall occurring PTA. Pt had a witnessed fall where she tripped over a wheelchair and hit her face on the floor. She has bruising to the chin. Pt presents with a towel neck roll as she complained of neck pain. Pt is currently denying pain. Nursing home denies LOC. Pt is on Eliquis  Past Medical History:  Diagnosis Date  . Anginal pain (HCC)    not in in last month  . Anxiety   . Arthritis    "hands" (10/03/2015)  . Atrial fibrillation (HCC)   . CAD (coronary artery disease) 03/01/2008   Lexiscan - EF 79, no ECG changes/EKG negative for ischemia, post-stress EF 86  . Cerebral atherosclerosis 10/15/2011   Extracranial Examination - mild-moderate amount smooth mixed density plaque elevating velocities within the proximal and mid segments of the internal carotid artery  . Chest pain 10/07/2009   2D Echo - EF 50-55, mitral valve calcified annulus  . CHF (congestive heart failure) (HCC)   . Chronic bronchitis (HCC)   . Claudication (HCC) 09/01/2010   LEA Doppler - Bilateral ABIs-demonstrated moderate arterial insufficiency to the lower extremities at rest, Right CIA/EIA-known occlusive disease, PV Cath  . Complication of anesthesia    "09/29/2015 put to sleep to  take out wisdom tooth; took >12h to get her awake" (10/03/2015  . Coronary artery disease   . Depression   . Dyspnea 06/01/2012   2D Echo - EF 60-65, mitral valve calcified annulus, left atrium severely dilated, right atrium moderately dilated  . GERD (gastroesophageal reflux disease)    takes prevacid  . High cholesterol   . Hypertension   . Hypothyroidism   . Memory loss   . Myocardial infarction   . PVD (peripheral vascular disease) (HCC)   . Stroke [facility]) ?10/02/2015  . Thyroid disease   . Type II diabetes mellitus (HCC)   . Vascular dementia     Patient Active Problem List   Diagnosis Date Noted  . Acute CVA (cerebrovascular accident) (HCC) 10/03/2015  . Cerebellar stroke (HCC)   . Paroxysmal atrial fibrillation (HCC)   . HLD (hyperlipidemia)   . Stroke (HCC) 10/02/2015  . Acute embolic stroke (HCC) 10/02/2015  . Stroke (cerebrum) (HCC) 10/02/2015  . Acute respiratory failure with hypoxia (HCC) 10/02/2015  . Essential hypertension 03/12/2013  . Atherosclerosis of native arteries of the extremities with intermittent claudication 11/04/2012  . Preoperative clearance 09/25/2012  . CAD (coronary artery disease) of artery bypass graft 09/25/2012  . Peripheral vascular disease, unspecified 09/23/2012  . Aftercare following surgery of the circulatory system, NEC 09/23/2012  . Chest pain 08/11/2012  . Chronic atrial fibrillation- ventricular rate is controlled 08/11/2012  . Dyslipidemia 06/03/2012  . PVD (peripheral vascular disease), AOBF '94  06/03/2012  . NSVT, EF 60-65% by 2D this admission, beta blocker increased 06/03/2012  . CAD, CABG X 2 '93- Last cath 05/2012- no change from 2011, medical Rx- EF of 60-65% via echo 05/2012 06/03/2012  . CHF (congestive heart failure) - mild, BNP 3K 05/31/2012  . Unstable angina (HCC) 05/31/2012  . Diabetes mellitus type II, controlled (HCC) 05/31/2012  . Atrial fibrillation with controlled ventricular response (HCC) 05/31/2012    Past  Surgical History:  Procedure Laterality Date  . ABDOMINAL HYSTERECTOMY    . ANGIOPLASTY Left 02/23/2010   Darcon patch, 80-90% stenosis proximal to the anastomosis  . AORTA - BILATERAL FEMORAL ARTERY BYPASS GRAFT  2011  . AXILLARY-FEMORAL BYPASS GRAFT Right 10/08/2012   Procedure: BYPASS GRAFT AXILLA-BIFEMORAL;  Surgeon: Pryor OchoaJames D Lawson, MD;  [facility];  Service: Vascular;  Laterality: Right;  . CARDIAC CATHETERIZATION  2012  . CARDIAC CATHETERIZATION  06/02/2012   Unchanged anatomy compared to 3 years ago, continue medical therapy  . CARDIAC CATHETERIZATION  10/06/2009   Occluded right coronary artery, left-to-right collateral noncritical left disease with occluded grafts  . CATARACT EXTRACTION W/ INTRAOCULAR LENS  IMPLANT, BILATERAL Bilateral   . CORONARY ANGIOPLASTY    . CORONARY ARTERY BYPASS GRAFT  1993  . EYE SURGERY    . JOINT REPLACEMENT    . LEFT HEART CATHETERIZATION WITH CORONARY/GRAFT ANGIOGRAM N/A 06/02/2012   Procedure: LEFT HEART CATHETERIZATION WITH Isabel CapriceORONARY/GRAFT ANGIOGRAM;  Surgeon: Runell GessJonathan J Berry, MD;  [facility];  Service: Cardiovascular;  Laterality: N/A;  . TOTAL KNEE ARTHROPLASTY Left 2010    OB History    No data available       Home Medications    Prior to Admission medications   Medication Sig Start Date End Date Taking? Authorizing Provider  acetaminophen (TYLENOL) 325 MG tablet Take 650 mg by mouth 4 (four) times daily.     Historical Provider, MD  acetaminophen (TYLENOL) 500 MG tablet Take 500 mg by mouth every 4 (four) hours as needed for mild pain, moderate pain, fever or headache.    Historical Provider, MD  albuterol (PROVENTIL HFA;VENTOLIN HFA) 108 (90 BASE) MCG/ACT inhaler Inhale 1-2 puffs into the lungs every 6 (six) hours as needed for wheezing. 12/31/12   Marlon Peliffany Greene, PA-C  alum & mag hydroxide-simeth (MINTOX) 200-200-20 MG/5ML suspension Take 30 mLs by mouth every 6 (six) hours as needed for indigestion or heartburn.     Historical Provider, MD  apixaban (ELIQUIS) 2.5 MG TABS tablet Take 1 tablet (2.5 mg total) by mouth 2 (two) times daily. 10/05/15   Rhetta MuraJai-Gurmukh Samtani, MD  aspirin EC 81 MG tablet Take 81 mg by mouth daily.    Historical Provider, MD  busPIRone (BUSPAR) 15 MG tablet Take 15 mg by mouth 2 (two) times daily.    Historical Provider, MD  calcium-vitamin D (OSCAL WITH D) 500-200 MG-UNIT tablet Take 1 tablet by mouth 2 (two) times daily.    Historical Provider, MD  cetirizine (ZYRTEC) 10 MG tablet Take 10 mg by mouth daily.    Historical Provider, MD  diphenhydrAMINE-zinc acetate (BENADRYL) cream Apply 1 application topically 2 (two) times daily.    Historical Provider, MD  famotidine (PEPCID) 20 MG tablet Take 20 mg by mouth daily.    Historical Provider, MD  furosemide (LASIX) 20 MG tablet Take 20 mg by mouth daily.    Historical Provider, MD  guaifenesin (ROBITUSSIN) 100 MG/5ML syrup Take 200 mg by mouth every 6 (six) hours as needed for  cough.     Historical Provider, MD  HYDROcodone-acetaminophen (NORCO/VICODIN) 5-325 MG tablet Take 0.5 to 1 tablet by mouth every 6 (six) hours as needed for severe pain. 03/20/16   Jaime Pilcher Ward, PA-C  Ipratropium-Albuterol (COMBIVENT RESPIMAT) 20-100 MCG/ACT AERS respimat Inhale 1 puff into the lungs every 6 (six) hours as needed for wheezing or shortness of breath.     Historical Provider, MD  levothyroxine (SYNTHROID, LEVOTHROID) 25 MCG tablet Take 37.5 mcg by mouth daily before breakfast.    Historical Provider, MD  loperamide (IMODIUM) 2 MG capsule Take 2 mg by mouth as needed for diarrhea or loose stools.     Historical Provider, MD  losartan (COZAAR) 100 MG tablet Take 100 mg by mouth daily.    Historical Provider, MD  magnesium hydroxide (MILK OF MAGNESIA) 400 MG/5ML suspension Take 30 mLs by mouth at bedtime as needed for mild constipation.     Historical Provider, MD  metoprolol succinate (TOPROL-XL) 25 MG 24 hr tablet Take 25 mg by mouth at bedtime.     Historical Provider, MD  Neomycin-Bacitracin-Polymyxin (TRIPLE ANTIBIOTIC) [phone] OINT Apply 1 application topically as needed (for minor skin tears/abrasions).    Historical Provider, MD  nitroGLYCERIN (NITROSTAT) 0.4 MG SL tablet Place 0.4 mg under the tongue every 5 (five) minutes as needed for chest pain.     Historical Provider, MD  omega-3 acid ethyl esters (LOVAZA) 1 G capsule Take 2 g by mouth daily.    Historical Provider, MD  PARoxetine (PAXIL) 30 MG tablet Take 30 mg by mouth daily.    Historical Provider, MD  polyvinyl alcohol (LIQUIFILM TEARS) 1.4 % ophthalmic solution Place 1 drop into both eyes 4 (four) times daily.     Historical Provider, MD  potassium chloride SA (K-DUR,KLOR-CON) 20 MEQ tablet Take 20 mEq by mouth daily.    Historical Provider, MD  ranolazine (RANEXA) 500 MG 12 hr tablet Take 1 tablet (500 mg total) by mouth 2 (two) times daily. 08/12/12   Brittainy Sherlynn Carbon, PA-C  rivastigmine (EXELON) 4.6 mg/24hr Place 4.6 mg onto the skin daily.    Historical Provider, MD  senna (SENOKOT) 8.6 MG tablet Take 1 tablet by mouth at bedtime.    Historical Provider, MD  simvastatin (ZOCOR) 10 MG tablet Take 10 mg by mouth at bedtime.     Historical Provider, MD  traZODone (DESYREL) 50 MG tablet Take 75 mg by mouth at bedtime.    Historical Provider, MD    Family History Family History  Problem Relation Age of Onset  . Heart disease Father   . Hypertension Father   . Heart attack Father   . Kidney failure Brother   . Hyperlipidemia Daughter     Social History Social History  Substance Use Topics  . Smoking status: Former Smoker    Packs/day: 1.00    Years: 50.00    Types: Cigarettes    Quit date: 05/15/1991  . Smokeless tobacco: Never Used  . Alcohol use No     Allergies   Codeine   Review of Systems Review of Systems  Unable to perform ROS: Dementia   Physical Exam Updated Vital Signs BP 146/89   Pulse 81   Temp 98.3 F (36.8 C) (Oral)   Resp 14    SpO2 97%   Physical Exam  Constitutional: She appears well-developed and well-nourished. No distress.  HENT:  Head: Normocephalic. Head is with contusion.  Nose: Nose normal.  Mouth/Throat: Oropharynx is clear and moist. No  oropharyngeal exudate.  Soft tissue hematoma to the chin  Eyes: Conjunctivae and EOM are normal. Pupils are equal, round, and reactive to light. No scleral icterus.  Neck: Normal range of motion. Neck supple. No JVD present. No tracheal deviation present. No thyromegaly present.  Towel collar applied  Cardiovascular: Normal rate, regular rhythm and normal heart sounds.  Exam reveals no gallop and no friction rub.   No murmur heard. Pulmonary/Chest: Effort normal and breath sounds normal. No respiratory distress. She has no wheezes. She exhibits no tenderness.  Abdominal: Soft. Bowel sounds are normal. She exhibits no distension and no mass. There is no tenderness. There is no rebound and no guarding.  Bruising to upper abdomen  Musculoskeletal: Normal range of motion. She exhibits no edema or tenderness.  Bruising and superficial abrasion to the left knee; multiple abrasions and bruising to BUE  Lymphadenopathy:    She has no cervical adenopathy.  Neurological: She is alert.  Left sided facial droop that is chronic  Skin: Skin is warm and dry. No rash noted. No erythema. No pallor.  Nursing note and vitals reviewed.  ED Treatments / Results  DIAGNOSTIC STUDIES: Oxygen Saturation is 97% on RA, normal by my interpretation.    COORDINATION OF CARE: 11:10 PM-CT scan, labs, and x-ray  Labs (all labs ordered are listed, but only abnormal results are displayed) Labs Reviewed  CBC WITH DIFFERENTIAL/PLATELET - Abnormal; Notable for the following:       Result Value   RDW 16.6 (*)    Neutro Abs 7.9 (*)    All other components within normal limits  COMPREHENSIVE METABOLIC PANEL - Abnormal; Notable for the following:    Glucose, Bld 147 (*)    Creatinine, Ser 1.24  (*)    Total Protein 5.9 (*)    ALT 13 (*)    GFR calc non Af Amer 38 (*)    GFR calc Af Amer 44 (*)    All other components within normal limits  PROTIME-INR - Abnormal; Notable for the following:    Prothrombin Time 23.7 (*)    All other components within normal limits  I-STAT CHEM 8, ED - Abnormal; Notable for the following:    Chloride 98 (*)    Creatinine, Ser 1.20 (*)    Glucose, Bld 138 (*)    Calcium, Ion 1.10 (*)    All other components within normal limits  URINE CULTURE  URINALYSIS, ROUTINE W REFLEX MICROSCOPIC (NOT AT [facility])    EKG  EKG Interpretation  Date/Time:  Monday April 02 2016 23:06:53 EST Ventricular Rate:  106 PR Interval:    QRS Duration: 90 QT Interval:  355 QTC Calculation: 472 R Axis:   93 Text Interpretation:  Atrial fibrillation Right axis deviation Nonspecific T abnormalities, diffuse leads No significant change since last tracing Confirmed by Erroll Luna [phone]) on 04/02/2016 11:41:19 PM       Radiology Ct Head Wo Contrast  Result Date: 04/03/2016 CLINICAL DATA:  Status post witnessed fall. Tripped over wheelchair and fell face down, with bruising to the chin. Neck pain. Patient on blood thinners. Concern for head injury. Initial encounter. EXAM: CT HEAD WITHOUT CONTRAST CT MAXILLOFACIAL WITHOUT CONTRAST CT CERVICAL SPINE WITHOUT CONTRAST TECHNIQUE: Multidetector CT imaging of the head, cervical spine, and maxillofacial structures were performed using the standard protocol without intravenous contrast. Multiplanar CT image reconstructions of the cervical spine and maxillofacial structures were also generated. COMPARISON:  CT of the head and cervical spine performed 03/20/2016, and  MRI of the brain performed 10/02/2015 FINDINGS: CT HEAD FINDINGS Brain: No evidence of acute infarction, hemorrhage, hydrocephalus, extra-axial collection or mass lesion/mass effect. Prominence of the ventricles and sulci reflects mild to moderate cortical  volume loss. Mild cerebellar atrophy is noted. Scattered periventricular and subcortical white matter change likely reflects small vessel ischemic microangiopathy. Chronic lacunar infarcts are seen at the basal ganglia bilaterally. The brainstem and fourth ventricle are within normal limits. The cerebral hemispheres demonstrate grossly normal gray-white differentiation. No mass effect or midline shift is seen. Vascular: No hyperdense vessel or unexpected calcification. Skull: There is no evidence of fracture; visualized osseous structures are unremarkable in appearance. Other: No significant soft tissue abnormalities are seen. CT MAXILLOFACIAL FINDINGS Osseous: There is no evidence of fracture or dislocation. The maxilla and mandible appear intact. The nasal bone is unremarkable in appearance. 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 Orbits: The orbits are intact bilaterally. Sinuses: The visualized paranasal sinuses and mastoid air cells are well-aerated. Soft tissues: Soft tissue swelling is noted at the left medial lower lip and chin. The parapharyngeal fat planes are preserved. The nasopharynx, oropharynx and hypopharynx are unremarkable in appearance. The visualized portions of the valleculae and piriform sinuses are grossly unremarkable. The parotid and submandibular glands are within normal limits. No cervical lymphadenopathy is seen. CT CERVICAL SPINE FINDINGS Alignment: Normal. Skull base and vertebrae: No acute fracture. No primary bone lesion or focal pathologic process. Soft tissues and spinal canal: No prevertebral fluid or swelling. No visible canal hematoma. Disc levels: Minimal disc space narrowing is noted at C6-C7. Scattered anterior and posterior disc osteophyte complexes are seen along the cervical spine. Degenerative change is noted about the dens. Upper chest: Mild interstitial prominence is noted at the lung apices.  Scattered calcification is noted at the carotid bifurcations. The thyroid gland is mildly heterogeneous but otherwise unremarkable. Other: No additional soft tissue abnormalities are seen. IMPRESSION: 1. No evidence of traumatic intracranial injury or fracture. 2. No evidence of fracture or dislocation with regard to the maxillofacial structures. 3. No evidence of fracture or subluxation along the cervical spine. 4. Soft tissue swelling at the left medial lower lip and chin. 5. Mild to moderate cortical volume loss and scattered small vessel ischemic microangiopathy. Chronic lacunar infarcts at the basal ganglia bilaterally. 6. 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 7. Mild degenerative change along the cervical spine. 8. Mild interstitial prominence at the lung apices. 9. Scattered calcification at the carotid bifurcations. Carotid ultrasound could be considered for further evaluation, as deemed clinically appropriate. Electronically Signed   By: Roanna Raider M.D.   On: 04/03/2016 01:22   Ct Chest W Contrast  Result Date: 04/03/2016 CLINICAL DATA:  Trip and fall injury. Bruising to the abdomen. Patient is on blood thinners. EXAM: CT CHEST, ABDOMEN, AND PELVIS WITH CONTRAST TECHNIQUE: Multidetector CT imaging of the chest, abdomen and pelvis was performed following the standard protocol during bolus administration of intravenous contrast. CONTRAST:  75 ml ISOVUE-300 IOPAMIDOL (ISOVUE-300) INJECTION 61% COMPARISON:  CT chest 10/03/2015. CT abdomen and pelvis runoff angiographic study 10/02/2012 FINDINGS: CT CHEST FINDINGS Cardiovascular: Cardiac enlargement. Reflux of contrast material into the hepatic veins suggest right heart failure. No pericardial effusion. Postoperative changes in the mediastinum. Coronary artery calcifications. Calcific and noncalcific atherosclerotic changes in the thoracic aorta. No aortic aneurysm or dissection.  Central pulmonary arteries are well opacified without evidence  of significant pulmonary embolus. Right axillary femoral bypass graft appears patent. Mediastinum/Nodes: No enlarged mediastinal, hilar, or axillary lymph nodes. Thyroid gland, trachea, and esophagus demonstrate no significant findings. Lungs/Pleura: Motion artifact limits examination. Mild interstitial reticular nodular pattern to the lungs probably represents mild edema. This is improved since prior study. Small bilateral pleural effusions. No focal consolidation. Airways are patent. No pneumothorax. Musculoskeletal: Degenerative changes throughout the thoracic spine. No vertebral compression deformities. Sternotomy wires are in place. Sternum appears intact. Degenerative changes in the shoulders. Fracture of the left posterior twelfth rib with mild displacement. This appears acute. CT ABDOMEN PELVIS FINDINGS Hepatobiliary: The gallbladder wall is somewhat indistinct. No stones are identified. Inflammatory process not excluded. Small amount of fluid around the liver edge. No discrete laceration identified. No bile duct dilatation. Pancreas: Unremarkable. No pancreatic ductal dilatation or surrounding inflammatory changes. Spleen: No splenic injury or perisplenic hematoma. Adrenals/Urinary Tract: No adrenal hemorrhage or renal injury identified. Bladder is unremarkable. Stomach/Bowel: Stomach, small bowel, and colon are not abnormally distended. Scattered stool throughout the colon. Colonic diverticulosis without evidence of diverticulitis. Vascular/Lymphatic: Diffuse atherosclerotic calcification throughout the abdominal aorta and branch vessels. Distal abdominal aorta is occluded. Minimal if any flow is demonstrated in the native iliac and external iliac arteries. Femoral to femoral bypass graft is patent. Reproductive: Status post hysterectomy. No adnexal masses. Other: Diffuse edema throughout the subcutaneous fat. No free air in the abdomen.  Musculoskeletal: Acute fractures of the left transverse process of L1 and L2. Diffuse degenerative changes throughout the spine. No vertebral compression deformities. Sacrum, pelvis, and hips appear intact. IMPRESSION: Cardiac enlargement with mild edema and small bilateral pleural effusions. Diffuse soft tissue edema in the subcutaneous fat. Small amount of fluid around the liver may be posttraumatic but no discrete laceration is identified. Edema in the gallbladder wall is nonspecific and could be inflammatory. No stones identified. No evidence of bowel perforation. Acute fractures of the left posterior twelfth rib and of the left transverse processes at L1 and L2. Aortic atherosclerosis with distal aortic occlusion. Patent right axillary to femoral and femoral to femoral bypass grafts. Electronically Signed   By: Burman Nieves M.D.   On: 04/03/2016 01:30   Ct Cervical Spine Wo Contrast  Result Date: 04/03/2016 CLINICAL DATA:  Status post witnessed fall. Tripped over wheelchair and fell face down, with bruising to the chin. Neck pain. Patient on blood thinners. Concern for head injury. Initial encounter. EXAM: CT HEAD WITHOUT CONTRAST CT MAXILLOFACIAL WITHOUT CONTRAST CT CERVICAL SPINE WITHOUT CONTRAST TECHNIQUE: Multidetector CT imaging of the head, cervical spine, and maxillofacial structures were performed using the standard protocol without intravenous contrast. Multiplanar CT image reconstructions of the cervical spine and maxillofacial structures were also generated. COMPARISON:  CT of the head and cervical spine performed 03/20/2016, and MRI of the brain performed 10/02/2015 FINDINGS: CT HEAD FINDINGS Brain: No evidence of acute infarction, hemorrhage, hydrocephalus, extra-axial collection or mass lesion/mass effect. Prominence of the ventricles and sulci reflects mild to moderate cortical volume loss. Mild cerebellar atrophy is noted. Scattered periventricular and subcortical white matter change  likely reflects small vessel ischemic microangiopathy. Chronic lacunar infarcts are seen at the basal ganglia bilaterally. The brainstem and fourth ventricle are within normal limits. The cerebral hemispheres demonstrate grossly normal gray-white differentiation. No mass effect or midline shift is seen. Vascular: No hyperdense vessel or unexpected calcification. Skull: There is no evidence of fracture; visualized osseous structures are unremarkable in appearance. Other: No significant soft tissue abnormalities are seen. CT MAXILLOFACIAL  FINDINGS Osseous: There is no evidence of fracture or dislocation. The maxilla and mandible appear intact. The nasal bone is unremarkable in appearance. 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 Orbits: The orbits are intact bilaterally. Sinuses: The visualized paranasal sinuses and mastoid air cells are well-aerated. Soft tissues: Soft tissue swelling is noted at the left medial lower lip and chin. The parapharyngeal fat planes are preserved. The nasopharynx, oropharynx and hypopharynx are unremarkable in appearance. The visualized portions of the valleculae and piriform sinuses are grossly unremarkable. The parotid and submandibular glands are within normal limits. No cervical lymphadenopathy is seen. CT CERVICAL SPINE FINDINGS Alignment: Normal. Skull base and vertebrae: No acute fracture. No primary bone lesion or focal pathologic process. Soft tissues and spinal canal: No prevertebral fluid or swelling. No visible canal hematoma. Disc levels: Minimal disc space narrowing is noted at C6-C7. Scattered anterior and posterior disc osteophyte complexes are seen along the cervical spine. Degenerative change is noted about the dens. Upper chest: Mild interstitial prominence is noted at the lung apices. Scattered calcification is noted at the carotid bifurcations. The thyroid gland is mildly heterogeneous but  otherwise unremarkable. Other: No additional soft tissue abnormalities are seen. IMPRESSION: 1. No evidence of traumatic intracranial injury or fracture. 2. No evidence of fracture or dislocation with regard to the maxillofacial structures. 3. No evidence of fracture or subluxation along the cervical spine. 4. Soft tissue swelling at the left medial lower lip and chin. 5. Mild to moderate cortical volume loss and scattered small vessel ischemic microangiopathy. Chronic lacunar infarcts at the basal ganglia bilaterally. 6. 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 7. Mild degenerative change along the cervical spine. 8. Mild interstitial prominence at the lung apices. 9. Scattered calcification at the carotid bifurcations. Carotid ultrasound could be considered for further evaluation, as deemed clinically appropriate. Electronically Signed   By: Roanna RaiderJeffery  Chang M.D.   On: 04/03/2016 01:22   Ct Abdomen Pelvis W Contrast  Result Date: 04/03/2016 CLINICAL DATA:  Trip and fall injury. Bruising to the abdomen. Patient is on blood thinners. EXAM: CT CHEST, ABDOMEN, AND PELVIS WITH CONTRAST TECHNIQUE: Multidetector CT imaging of the chest, abdomen and pelvis was performed following the standard protocol during bolus administration of intravenous contrast. CONTRAST:  75 ml ISOVUE-300 IOPAMIDOL (ISOVUE-300) INJECTION 61% COMPARISON:  CT chest 10/03/2015. CT abdomen and pelvis runoff angiographic study 10/02/2012 FINDINGS: CT CHEST FINDINGS Cardiovascular: Cardiac enlargement. Reflux of contrast material into the hepatic veins suggest right heart failure. No pericardial effusion. Postoperative changes in the mediastinum. Coronary artery calcifications. Calcific and noncalcific atherosclerotic changes in the thoracic aorta. No aortic aneurysm or dissection. Central pulmonary arteries are well opacified without evidence of significant pulmonary embolus. Right  axillary femoral bypass graft appears patent. Mediastinum/Nodes: No enlarged mediastinal, hilar, or axillary lymph nodes. Thyroid gland, trachea, and esophagus demonstrate no significant findings. Lungs/Pleura: Motion artifact limits examination. Mild interstitial reticular nodular pattern to the lungs probably represents mild edema. This is improved since prior study. Small bilateral pleural effusions. No focal consolidation. Airways are patent. No pneumothorax. Musculoskeletal: Degenerative changes throughout the thoracic spine. No vertebral compression deformities. Sternotomy wires are in place. Sternum appears intact. Degenerative changes in the shoulders. Fracture of the left posterior twelfth rib with mild displacement. This appears acute. CT ABDOMEN PELVIS FINDINGS Hepatobiliary: The gallbladder wall is somewhat indistinct. No stones are identified. Inflammatory process not excluded. Small  amount of fluid around the liver edge. No discrete laceration identified. No bile duct dilatation. Pancreas: Unremarkable. No pancreatic ductal dilatation or surrounding inflammatory changes. Spleen: No splenic injury or perisplenic hematoma. Adrenals/Urinary Tract: No adrenal hemorrhage or renal injury identified. Bladder is unremarkable. Stomach/Bowel: Stomach, small bowel, and colon are not abnormally distended. Scattered stool throughout the colon. Colonic diverticulosis without evidence of diverticulitis. Vascular/Lymphatic: Diffuse atherosclerotic calcification throughout the abdominal aorta and branch vessels. Distal abdominal aorta is occluded. Minimal if any flow is demonstrated in the native iliac and external iliac arteries. Femoral to femoral bypass graft is patent. Reproductive: Status post hysterectomy. No adnexal masses. Other: Diffuse edema throughout the subcutaneous fat. No free air in the abdomen. Musculoskeletal: Acute fractures of the left transverse process of L1 and L2. Diffuse degenerative changes  throughout the spine. No vertebral compression deformities. Sacrum, pelvis, and hips appear intact. IMPRESSION: Cardiac enlargement with mild edema and small bilateral pleural effusions. Diffuse soft tissue edema in the subcutaneous fat. Small amount of fluid around the liver may be posttraumatic but no discrete laceration is identified. Edema in the gallbladder wall is nonspecific and could be inflammatory. No stones identified. No evidence of bowel perforation. Acute fractures of the left posterior twelfth rib and of the left transverse processes at L1 and L2. Aortic atherosclerosis with distal aortic occlusion. Patent right axillary to femoral and femoral to femoral bypass grafts. Electronically Signed   By: Burman Nieves M.D.   On: 04/03/2016 01:30   Dg Knee Complete 4 Views Left  Result Date: 04/03/2016 CLINICAL DATA:  Status post fall at nursing home EXAM: LEFT KNEE - COMPLETE 4+ VIEW COMPARISON:  None. FINDINGS: There is no acute fracture or dislocation of the left knee. There is lateral compartment chondrocalcinosis. There is moderate medial compartment narrowing with medial and lateral marginal osteophytosis. No knee effusion. Small fragment adjacent to the medial tibial condyles favored to be chronic and possibly related to chronic tendinitis. IMPRESSION: 1. No acute fracture or dislocation of the left knee. 2. Moderate medial compartment predominant osteoarthrosis and lateral compartment chondrocalcinosis. Electronically Signed   By: Deatra Robinson M.D.   On: 04/03/2016 00:04   Ct Maxillofacial Wo Contrast  Result Date: 04/03/2016 CLINICAL DATA:  Status post witnessed fall. Tripped over wheelchair and fell face down, with bruising to the chin. Neck pain. Patient on blood thinners. Concern for head injury. Initial encounter. EXAM: CT HEAD WITHOUT CONTRAST CT MAXILLOFACIAL WITHOUT CONTRAST CT CERVICAL SPINE WITHOUT CONTRAST TECHNIQUE: Multidetector CT imaging of the head, cervical spine, and  maxillofacial structures were performed using the standard protocol without intravenous contrast. Multiplanar CT image reconstructions of the cervical spine and maxillofacial structures were also generated. COMPARISON:  CT of the head and cervical spine performed 03/20/2016, and MRI of the brain performed 10/02/2015 FINDINGS: CT HEAD FINDINGS Brain: No evidence of acute infarction, hemorrhage, hydrocephalus, extra-axial collection or mass lesion/mass effect. Prominence of the ventricles and sulci reflects mild to moderate cortical volume loss. Mild cerebellar atrophy is noted. Scattered periventricular and subcortical white matter change likely reflects small vessel ischemic microangiopathy. Chronic lacunar infarcts are seen at the basal ganglia bilaterally. The brainstem and fourth ventricle are within normal limits. The cerebral hemispheres demonstrate grossly normal gray-white differentiation. No mass effect or midline shift is seen. Vascular: No hyperdense vessel or unexpected calcification. Skull: There is no evidence of fracture; visualized osseous structures are unremarkable in appearance. Other: No significant soft tissue abnormalities are seen. CT MAXILLOFACIAL FINDINGS Osseous: There is no evidence  of fracture or dislocation. The maxilla and mandible appear intact. The nasal bone is unremarkable in appearance. 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 Orbits: The orbits are intact bilaterally. Sinuses: The visualized paranasal sinuses and mastoid air cells are well-aerated. Soft tissues: Soft tissue swelling is noted at the left medial lower lip and chin. The parapharyngeal fat planes are preserved. The nasopharynx, oropharynx and hypopharynx are unremarkable in appearance. The visualized portions of the valleculae and piriform sinuses are grossly unremarkable. The parotid and submandibular glands are within normal limits. No cervical  lymphadenopathy is seen. CT CERVICAL SPINE FINDINGS Alignment: Normal. Skull base and vertebrae: No acute fracture. No primary bone lesion or focal pathologic process. Soft tissues and spinal canal: No prevertebral fluid or swelling. No visible canal hematoma. Disc levels: Minimal disc space narrowing is noted at C6-C7. Scattered anterior and posterior disc osteophyte complexes are seen along the cervical spine. Degenerative change is noted about the dens. Upper chest: Mild interstitial prominence is noted at the lung apices. Scattered calcification is noted at the carotid bifurcations. The thyroid gland is mildly heterogeneous but otherwise unremarkable. Other: No additional soft tissue abnormalities are seen. IMPRESSION: 1. No evidence of traumatic intracranial injury or fracture. 2. No evidence of fracture or dislocation with regard to the maxillofacial structures. 3. No evidence of fracture or subluxation along the cervical spine. 4. Soft tissue swelling at the left medial lower lip and chin. 5. Mild to moderate cortical volume loss and scattered small vessel ischemic microangiopathy. Chronic lacunar infarcts at the basal ganglia bilaterally. 6. 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 7. Mild degenerative change along the cervical spine. 8. Mild interstitial prominence at the lung apices. 9. Scattered calcification at the carotid bifurcations. Carotid ultrasound could be considered for further evaluation, as deemed clinically appropriate. Electronically Signed   By: Roanna Raider M.D.   On: 04/03/2016 01:22    Procedures Procedures (including critical care time)  Medications Ordered in ED Medications  Tdap (BOOSTRIX) injection 0.5 mL (0.5 mLs Intramuscular Given 04/03/16 0202)  sodium chloride 0.9 % bolus 500 mL (500 mLs Intravenous New Bag/Given 04/03/16 0139)  iopamidol (ISOVUE-300) 61 % injection (75 mLs  Contrast Given 04/03/16  0035)     Initial Impression / Assessment and Plan / ED Course  I have reviewed the triage vital signs and the nursing notes.  Pertinent labs & imaging results that were available during my care of the patient were reviewed by me and considered in my medical decision making (see chart for details).  Clinical Course     Patient presents emergency department after an unwitnessed fall. She has multiple bruising on her upper extremities appears to be from previous falls. She has new abrasion to her knee. There is also bruising in her abdomen. Will obtain CT scan of the head through the abdomen for evaluation as she is on blood thinners. Patient also tachycardic and given IV fluids.  We'll continue to closely monitor.   2:06 AM CT reveals Acute fractures of the left transverse process L1 and L2 as well as fracture of the left posterior 12th rib. Patient was informed of diagnosis. Vital signs remain normal, there is no hypoxia. Advise follow-up with a primary care physician for further care.  Patient has Norco at home for severe pain. Vital signs were within her normal limits and she is safe for discharge. CT reveals some  fluid around the liver and gallbladder.  Patient has no tenderness on exam and labs are normal. [place] are not indicated currently.  Plan for DC back to her facility.  Final Clinical Impressions(s) / ED Diagnoses   Final diagnoses:  None     I personally performed the services described in this documentation, which was scribed in my presence. The recorded information has been reviewed and is accurate.     New Prescriptions New Prescriptions   No medications on file     Tomasita Crumble, MD 04/03/16 1610    Tomasita Crumble, MD 04/03/16 [id number]

## 2016-04-02 NOTE — ED Triage Notes (Addendum)
Pt with hx of dementia [facility] a witnessed fall that occurred earlier tonight. Per EMS, pt tripped over a wheelchair and fell face first on the floor. Pt reports neck pain which is relieved with change of position. Denies LOC, trauma to extremities. Bruising noted to chin and abdomen. Alert to self and event. CBG 211. 162/102, 90 bpm, 18 RR. Pt takes Eliquis.

## 2016-04-03 ENCOUNTER — Emergency Department (HOSPITAL_COMMUNITY): Payer: Medicare Other

## 2016-04-03 DIAGNOSIS — S2232XA Fracture of one rib, left side, initial encounter for closed fracture: Secondary | ICD-10-CM | POA: Diagnosis not present

## 2016-04-03 LAB — COMPREHENSIVE METABOLIC PANEL
ALBUMIN: 3.5 g/dL (ref 3.5–5.0)
ALT: 13 U/L — AB (ref 14–54)
AST: 22 U/L (ref 15–41)
Alkaline Phosphatase: 98 U/L (ref 38–126)
Anion gap: 8 (ref 5–15)
BUN: 14 mg/dL (ref 6–20)
CHLORIDE: 101 mmol/L (ref 101–111)
CO2: 27 mmol/L (ref 22–32)
CREATININE: 1.24 mg/dL — AB (ref 0.44–1.00)
Calcium: 9.2 mg/dL (ref 8.9–10.3)
GFR calc Af Amer: 44 mL/min — ABNORMAL LOW (ref >60)
GFR, EST NON AFRICAN AMERICAN: 38 mL/min — AB (ref >60)
GLUCOSE: 147 mg/dL — AB (ref 65–99)
Potassium: 3.5 mmol/L (ref 3.5–5.1)
Sodium: 136 mmol/L (ref 135–145)
Total Bilirubin: 0.8 mg/dL (ref 0.3–1.2)
Total Protein: 5.9 g/dL — ABNORMAL LOW (ref 6.5–8.1)

## 2016-04-03 LAB — URINALYSIS, ROUTINE W REFLEX MICROSCOPIC
GLUCOSE, UA: NEGATIVE mg/dL
Hgb urine dipstick: NEGATIVE
KETONES UR: NEGATIVE mg/dL
LEUKOCYTES UA: NEGATIVE
Nitrite: NEGATIVE
PH: 6 (ref 5.0–8.0)
Protein, ur: NEGATIVE mg/dL
SPECIFIC GRAVITY, URINE: 1.018 (ref 1.005–1.030)

## 2016-04-03 LAB — CBC WITH DIFFERENTIAL/PLATELET
BASOS ABS: 0 10*3/uL (ref 0.0–0.1)
Basophils Relative: 0 %
EOS PCT: 2 %
Eosinophils Absolute: 0.2 10*3/uL (ref 0.0–0.7)
HEMATOCRIT: 37.7 % (ref 36.0–46.0)
Hemoglobin: 12.2 g/dL (ref 12.0–15.0)
LYMPHS PCT: 11 %
Lymphs Abs: 1.2 10*3/uL (ref 0.7–4.0)
MCH: 28.1 pg (ref 26.0–34.0)
MCHC: 32.4 g/dL (ref 30.0–36.0)
MCV: 86.9 fL (ref 78.0–100.0)
Monocytes Absolute: 1 10*3/uL (ref 0.1–1.0)
Monocytes Relative: 10 %
NEUTROS ABS: 7.9 10*3/uL — AB (ref 1.7–7.7)
Neutrophils Relative %: 77 %
PLATELETS: 199 10*3/uL (ref 150–400)
RBC: 4.34 MIL/uL (ref 3.87–5.11)
RDW: 16.6 % — ABNORMAL HIGH (ref 11.5–15.5)
WBC: 10.2 10*3/uL (ref 4.0–10.5)

## 2016-04-03 LAB — I-STAT CHEM 8, ED
BUN: 18 mg/dL (ref 6–20)
CREATININE: 1.2 mg/dL — AB (ref 0.44–1.00)
Calcium, Ion: 1.1 mmol/L — ABNORMAL LOW (ref 1.15–1.40)
Chloride: 98 mmol/L — ABNORMAL LOW (ref 101–111)
GLUCOSE: 138 mg/dL — AB (ref 65–99)
HEMATOCRIT: 41 % (ref 36.0–46.0)
Hemoglobin: 13.9 g/dL (ref 12.0–15.0)
POTASSIUM: 3.6 mmol/L (ref 3.5–5.1)
Sodium: 138 mmol/L (ref 135–145)
TCO2: 27 mmol/L (ref 0–100)

## 2016-04-03 LAB — PROTIME-INR
INR: 2.08
PROTHROMBIN TIME: 23.7 s — AB (ref 11.4–15.2)

## 2016-04-03 MED ORDER — IOPAMIDOL (ISOVUE-300) INJECTION 61%
INTRAVENOUS | Status: AC
  Administered 2016-04-03: 75 mL
  Filled 2016-04-03: qty 75

## 2016-04-03 NOTE — ED Notes (Signed)
Patient transported to CT 

## 2016-04-03 NOTE — ED Notes (Signed)
[facility] has been called and report has been given to nursing home.

## 2016-04-04 ENCOUNTER — Emergency Department (HOSPITAL_COMMUNITY): Payer: Medicare Other

## 2016-04-04 ENCOUNTER — Emergency Department (HOSPITAL_COMMUNITY)
Admission: EM | Admit: 2016-04-04 | Discharge: 2016-04-04 | Disposition: A | Discharge status: Home / Self Care | Payer: Medicare Other | Attending: Physician Assistant | Admitting: Physician Assistant

## 2016-04-04 ENCOUNTER — Encounter (HOSPITAL_COMMUNITY): Payer: Self-pay

## 2016-04-04 DIAGNOSIS — S0083XA Contusion of other part of head, initial encounter: Secondary | ICD-10-CM | POA: Diagnosis not present

## 2016-04-04 DIAGNOSIS — Z951 Presence of aortocoronary bypass graft: Secondary | ICD-10-CM | POA: Insufficient documentation

## 2016-04-04 DIAGNOSIS — E119 Type 2 diabetes mellitus without complications: Secondary | ICD-10-CM | POA: Diagnosis not present

## 2016-04-04 DIAGNOSIS — S80212A Abrasion, left knee, initial encounter: Secondary | ICD-10-CM | POA: Insufficient documentation

## 2016-04-04 DIAGNOSIS — E039 Hypothyroidism, unspecified: Secondary | ICD-10-CM | POA: Diagnosis not present

## 2016-04-04 DIAGNOSIS — Z96652 Presence of left artificial knee joint: Secondary | ICD-10-CM | POA: Insufficient documentation

## 2016-04-04 DIAGNOSIS — I2510 Atherosclerotic heart disease of native coronary artery without angina pectoris: Secondary | ICD-10-CM | POA: Insufficient documentation

## 2016-04-04 DIAGNOSIS — Y999 Unspecified external cause status: Secondary | ICD-10-CM | POA: Insufficient documentation

## 2016-04-04 DIAGNOSIS — Z7901 Long term (current) use of anticoagulants: Secondary | ICD-10-CM | POA: Diagnosis not present

## 2016-04-04 DIAGNOSIS — Y939 Activity, unspecified: Secondary | ICD-10-CM | POA: Insufficient documentation

## 2016-04-04 DIAGNOSIS — I110 Hypertensive heart disease with heart failure: Secondary | ICD-10-CM | POA: Diagnosis not present

## 2016-04-04 DIAGNOSIS — Z8673 Personal history of transient ischemic attack (TIA), and cerebral infarction without residual deficits: Secondary | ICD-10-CM | POA: Insufficient documentation

## 2016-04-04 DIAGNOSIS — S0990XA Unspecified injury of head, initial encounter: Secondary | ICD-10-CM | POA: Diagnosis present

## 2016-04-04 DIAGNOSIS — Z87891 Personal history of nicotine dependence: Secondary | ICD-10-CM | POA: Diagnosis not present

## 2016-04-04 DIAGNOSIS — W19XXXA Unspecified fall, initial encounter: Secondary | ICD-10-CM

## 2016-04-04 DIAGNOSIS — I509 Heart failure, unspecified: Secondary | ICD-10-CM | POA: Diagnosis not present

## 2016-04-04 DIAGNOSIS — W1830XA Fall on same level, unspecified, initial encounter: Secondary | ICD-10-CM | POA: Diagnosis not present

## 2016-04-04 DIAGNOSIS — Z79899 Other long term (current) drug therapy: Secondary | ICD-10-CM | POA: Insufficient documentation

## 2016-04-04 DIAGNOSIS — Z7982 Long term (current) use of aspirin: Secondary | ICD-10-CM | POA: Diagnosis not present

## 2016-04-04 DIAGNOSIS — Y92009 Unspecified place in unspecified non-institutional (private) residence as the place of occurrence of the external cause: Secondary | ICD-10-CM

## 2016-04-04 DIAGNOSIS — Y92121 Bathroom in nursing home as the place of occurrence of the external cause: Secondary | ICD-10-CM | POA: Insufficient documentation

## 2016-04-04 LAB — URINALYSIS, ROUTINE W REFLEX MICROSCOPIC
Glucose, UA: NEGATIVE mg/dL
Hgb urine dipstick: NEGATIVE
KETONES UR: NEGATIVE mg/dL
NITRITE: NEGATIVE
PH: 6 (ref 5.0–8.0)
PROTEIN: 30 mg/dL — AB
Specific Gravity, Urine: 1.027 (ref 1.005–1.030)

## 2016-04-04 LAB — COMPREHENSIVE METABOLIC PANEL
ALT: 11 U/L — ABNORMAL LOW (ref 14–54)
ANION GAP: 8 (ref 5–15)
AST: 23 U/L (ref 15–41)
Albumin: 3.3 g/dL — ABNORMAL LOW (ref 3.5–5.0)
Alkaline Phosphatase: 90 U/L (ref 38–126)
BILIRUBIN TOTAL: 1 mg/dL (ref 0.3–1.2)
BUN: 7 mg/dL (ref 6–20)
CO2: 27 mmol/L (ref 22–32)
Calcium: 9 mg/dL (ref 8.9–10.3)
Chloride: 102 mmol/L (ref 101–111)
Creatinine, Ser: 0.97 mg/dL (ref 0.44–1.00)
GFR, EST AFRICAN AMERICAN: 60 mL/min — AB (ref >60)
GFR, EST NON AFRICAN AMERICAN: 51 mL/min — AB (ref >60)
Glucose, Bld: 118 mg/dL — ABNORMAL HIGH (ref 65–99)
POTASSIUM: 3.4 mmol/L — AB (ref 3.5–5.1)
Sodium: 137 mmol/L (ref 135–145)
TOTAL PROTEIN: 5.8 g/dL — AB (ref 6.5–8.1)

## 2016-04-04 LAB — CBC WITH DIFFERENTIAL/PLATELET
Basophils Absolute: 0 10*3/uL (ref 0.0–0.1)
Basophils Relative: 0 %
EOS PCT: 2 %
Eosinophils Absolute: 0.2 10*3/uL (ref 0.0–0.7)
HEMATOCRIT: 38.3 % (ref 36.0–46.0)
Hemoglobin: 12.2 g/dL (ref 12.0–15.0)
LYMPHS PCT: 8 %
Lymphs Abs: 0.9 10*3/uL (ref 0.7–4.0)
MCH: 27.7 pg (ref 26.0–34.0)
MCHC: 31.9 g/dL (ref 30.0–36.0)
MCV: 86.8 fL (ref 78.0–100.0)
MONO ABS: 0.9 10*3/uL (ref 0.1–1.0)
MONOS PCT: 8 %
NEUTROS ABS: 8.5 10*3/uL — AB (ref 1.7–7.7)
Neutrophils Relative %: 82 %
PLATELETS: 202 10*3/uL (ref 150–400)
RBC: 4.41 MIL/uL (ref 3.87–5.11)
RDW: 16.2 % — AB (ref 11.5–15.5)
WBC: 10.5 10*3/uL (ref 4.0–10.5)

## 2016-04-04 LAB — I-STAT TROPONIN, ED: TROPONIN I, POC: 0.01 ng/mL (ref 0.00–0.08)

## 2016-04-04 LAB — URINE MICROSCOPIC-ADD ON

## 2016-04-04 LAB — PROTIME-INR
INR: 1.56
Prothrombin Time: 18.8 seconds — ABNORMAL HIGH (ref 11.4–15.2)

## 2016-04-04 LAB — URINE CULTURE

## 2016-04-04 NOTE — ED Triage Notes (Signed)
Pt brought in by EMS sue to having a second fall within 48 hours. Pt has hx of dementia. Pt is on eliquis. Pt was in bathroom and fell onto the floor. Fall was unwitnessed. Pt c/o left knee pain and teeth pain. Pt has bruise chin from fall yesterday. Pt a&ox1.

## 2016-04-04 NOTE — Discharge Instructions (Signed)
Patient was seen for a second fall within 48 hours. I think patient needs to follow up with primary care physician to reevaluate whether she should be on Coumadin or not. Patient got no new injuries today. No evidence of urinary tract infection or pulmonary infection. Please follow-up with PCP.

## 2016-04-04 NOTE — ED Notes (Addendum)
Patient undressed, in gown, on monitor, continuous pulse oximetry and blood pressure cuff; myself and Whitney, RN cleaned patient and placed a dry diaper on patient and chuks underneath patient; also assisted Whitney, RN with in and out cath on patient to obtain an urine specimen; patient now resting

## 2016-04-04 NOTE — ED Notes (Signed)
[facility]

## 2016-04-04 NOTE — ED Notes (Signed)
Pt ambulated with assistance of 2 RNs

## 2016-04-04 NOTE — ED Notes (Signed)
Daughter updated and notified that [facility].

## 2016-04-04 NOTE — ED Provider Notes (Signed)
[facility]-EMERGENCY DEPT Provider Note   CSN: [id number] Arrival date & time: 04/04/16  0827     History   Chief Complaint Chief Complaint  Patient presents with  . Fall    HPI [name] Short is a 82 y.o. female.  HPI   [name] Short is a 82 y.o. female with a hx of A-Fib, vascular dementia, Type II DM, CAD, stroke, and MI brought in by EMS from [facility] nursing home who presents to the Emergency Department complaining of a fall occurring PTA.  Fall was unwitnesed, in the bathroom.  Pt had fall < 24 hours ago- had multiple CTs, showing one rib fracture, L spine fracture,non specific fluid around liver.   Pt unabelt to give hisotry- level 5 caveat demetia,   Past Medical History:  Diagnosis Date  . Anginal pain (HCC)    not in in last month  . Anxiety   . Arthritis    "hands" (10/03/2015)  . Atrial fibrillation (HCC)   . CAD (coronary artery disease) 03/01/2008   Lexiscan - EF 79, no ECG changes/EKG negative for ischemia, post-stress EF 86  . Cerebral atherosclerosis 10/15/2011   Extracranial Examination - mild-moderate amount smooth mixed density plaque elevating velocities within the proximal and mid segments of the internal carotid artery  . Chest pain 10/07/2009   2D Echo - EF 50-55, mitral valve calcified annulus  . CHF (congestive heart failure) (HCC)   . Chronic bronchitis (HCC)   . Claudication (HCC) 09/01/2010   LEA Doppler - Bilateral ABIs-demonstrated moderate arterial insufficiency to the lower extremities at rest, Right CIA/EIA-known occlusive disease, PV Cath  . Complication of anesthesia    "09/29/2015 put to sleep to take out wisdom tooth; took >12h to get her awake" (10/03/2015  . Coronary artery disease   . Depression   . Dyspnea 06/01/2012   2D Echo - EF 60-65, mitral valve calcified annulus, left atrium severely dilated, right atrium moderately dilated  . GERD (gastroesophageal reflux disease)    takes prevacid  . High cholesterol   . Hypertension   .  Hypothyroidism   . Memory loss   . Myocardial infarction   . PVD (peripheral vascular disease) (HCC)   . Stroke [facility]) ?10/02/2015  . Thyroid disease   . Type II diabetes mellitus (HCC)   . Vascular dementia     Patient Active Problem List   Diagnosis Date Noted  . Acute CVA (cerebrovascular accident) (HCC) 10/03/2015  . Cerebellar stroke (HCC)   . Paroxysmal atrial fibrillation (HCC)   . HLD (hyperlipidemia)   . Stroke (HCC) 10/02/2015  . Acute embolic stroke (HCC) 10/02/2015  . Stroke (cerebrum) (HCC) 10/02/2015  . Acute respiratory failure with hypoxia (HCC) 10/02/2015  . Essential hypertension 03/12/2013  . Atherosclerosis of native arteries of the extremities with intermittent claudication 11/04/2012  . Preoperative clearance 09/25/2012  . CAD (coronary artery disease) of artery bypass graft 09/25/2012  . Peripheral vascular disease, unspecified 09/23/2012  . Aftercare following surgery of the circulatory system, NEC 09/23/2012  . Chest pain 08/11/2012  . Chronic atrial fibrillation- ventricular rate is controlled 08/11/2012  . Dyslipidemia 06/03/2012  . PVD (peripheral vascular disease), AOBF '94 06/03/2012  . NSVT, EF 60-65% by 2D this admission, beta blocker increased 06/03/2012  . CAD, CABG X 2 '93- Last cath 05/2012- no change from 2011, medical Rx- EF of 60-65% via echo 05/2012 06/03/2012  . CHF (congestive heart failure) - mild, BNP 3K 05/31/2012  . Unstable angina (HCC) 05/31/2012  .  Diabetes mellitus type II, controlled (HCC) 05/31/2012  . Atrial fibrillation with controlled ventricular response (HCC) 05/31/2012    Past Surgical History:  Procedure Laterality Date  . ABDOMINAL HYSTERECTOMY    . ANGIOPLASTY Left 02/23/2010   Darcon patch, 80-90% stenosis proximal to the anastomosis  . AORTA - BILATERAL FEMORAL ARTERY BYPASS GRAFT  2011  . AXILLARY-FEMORAL BYPASS GRAFT Right 10/08/2012   Procedure: BYPASS GRAFT AXILLA-BIFEMORAL;  Surgeon: Pryor Ochoa, MD;   Location: [facility] OR;  Service: Vascular;  Laterality: Right;  . CARDIAC CATHETERIZATION  2012  . CARDIAC CATHETERIZATION  06/02/2012   Unchanged anatomy compared to 3 years ago, continue medical therapy  . CARDIAC CATHETERIZATION  10/06/2009   Occluded right coronary artery, left-to-right collateral noncritical left disease with occluded grafts  . CATARACT EXTRACTION W/ INTRAOCULAR LENS  IMPLANT, BILATERAL Bilateral   . CORONARY ANGIOPLASTY    . CORONARY ARTERY BYPASS GRAFT  1993  . EYE SURGERY    . JOINT REPLACEMENT    . LEFT HEART CATHETERIZATION WITH CORONARY/GRAFT ANGIOGRAM N/A 06/02/2012   Procedure: LEFT HEART CATHETERIZATION WITH Isabel Caprice;  Surgeon: Runell Gess, MD;  Location: [facility] CATH LAB;  Service: Cardiovascular;  Laterality: N/A;  . TOTAL KNEE ARTHROPLASTY Left 2010    OB History    No data available       Home Medications    Prior to Admission medications   Medication Sig Start Date End Date Taking? Authorizing Provider  acetaminophen (TYLENOL) 325 MG tablet Take 650 mg by mouth 4 (four) times daily.    Yes Historical Provider, MD  acetaminophen (TYLENOL) 500 MG tablet Take 500 mg by mouth every 4 (four) hours as needed for mild pain, moderate pain, fever or headache.   Yes Historical Provider, MD  albuterol (PROVENTIL HFA;VENTOLIN HFA) 108 (90 BASE) MCG/ACT inhaler Inhale 1-2 puffs into the lungs every 6 (six) hours as needed for wheezing. 12/31/12  Yes Marlon Pel, PA-C  alum & mag hydroxide-simeth (MINTOX) 200-200-20 MG/5ML suspension Take 30 mLs by mouth every 6 (six) hours as needed for indigestion or heartburn.   Yes Historical Provider, MD  apixaban (ELIQUIS) 2.5 MG TABS tablet Take 1 tablet (2.5 mg total) by mouth 2 (two) times daily. 10/05/15  Yes Rhetta Mura, MD  aspirin EC 81 MG tablet Take 81 mg by mouth daily.   Yes Historical Provider, MD  busPIRone (BUSPAR) 15 MG tablet Take 15 mg by mouth 2 (two) times daily.   Yes Historical Provider,  MD  calcium-vitamin D (OSCAL WITH D) 500-200 MG-UNIT tablet Take 1 tablet by mouth 2 (two) times daily.   Yes Historical Provider, MD  cetirizine (ZYRTEC) 10 MG tablet Take 10 mg by mouth daily.   Yes Historical Provider, MD  diphenhydrAMINE-zinc acetate (BENADRYL) cream Apply 1 application topically 2 (two) times daily.   Yes Historical Provider, MD  famotidine (PEPCID) 20 MG tablet Take 20 mg by mouth daily.   Yes Historical Provider, MD  furosemide (LASIX) 20 MG tablet Take 20 mg by mouth daily.   Yes Historical Provider, MD  guaifenesin (ROBITUSSIN) 100 MG/5ML syrup Take 200 mg by mouth every 6 (six) hours as needed for cough.    Yes Historical Provider, MD  HYDROcodone-acetaminophen (NORCO/VICODIN) 5-325 MG tablet Take 0.5 to 1 tablet by mouth every 6 (six) hours as needed for severe pain. 03/20/16  Yes Jaime Pilcher Ward, PA-C  Ipratropium-Albuterol (COMBIVENT RESPIMAT) 20-100 MCG/ACT AERS respimat Inhale 1 puff into the lungs every 6 (six) hours as  needed for wheezing or shortness of breath.    Yes Historical Provider, MD  levothyroxine (SYNTHROID, LEVOTHROID) 25 MCG tablet Take 37.5 mcg by mouth daily before breakfast.   Yes Historical Provider, MD  losartan (COZAAR) 100 MG tablet Take 100 mg by mouth daily.   Yes Historical Provider, MD  metoprolol succinate (TOPROL-XL) 25 MG 24 hr tablet Take 25 mg by mouth at bedtime.   Yes Historical Provider, MD  omega-3 acid ethyl esters (LOVAZA) 1 G capsule Take 2 g by mouth daily.   Yes Historical Provider, MD  PARoxetine (PAXIL) 30 MG tablet Take 30 mg by mouth daily.   Yes Historical Provider, MD  polyvinyl alcohol (LIQUIFILM TEARS) 1.4 % ophthalmic solution Place 1 drop into both eyes 4 (four) times daily.    Yes Historical Provider, MD  potassium chloride SA (K-DUR,KLOR-CON) 20 MEQ tablet Take 20 mEq by mouth daily.   Yes Historical Provider, MD  ranolazine (RANEXA) 500 MG 12 hr tablet Take 1 tablet (500 mg total) by mouth 2 (two) times daily.  08/12/12  Yes Brittainy Sherlynn Carbon, PA-C  rivastigmine (EXELON) 4.6 mg/24hr Place 4.6 mg onto the skin daily.   Yes Historical Provider, MD  senna (SENOKOT) 8.6 MG tablet Take 1 tablet by mouth at bedtime.   Yes Historical Provider, MD  simvastatin (ZOCOR) 10 MG tablet Take 10 mg by mouth at bedtime.    Yes Historical Provider, MD  traZODone (DESYREL) 50 MG tablet Take 75 mg by mouth at bedtime.   Yes Historical Provider, MD  cetirizine (ZYRTEC) 10 MG tablet Take 10 mg by mouth daily.    Historical Provider, MD  loperamide (IMODIUM) 2 MG capsule Take 2 mg by mouth as needed for diarrhea or loose stools.     Historical Provider, MD  magnesium hydroxide (MILK OF MAGNESIA) 400 MG/5ML suspension Take 30 mLs by mouth at bedtime as needed for mild constipation.     Historical Provider, MD  Neomycin-Bacitracin-Polymyxin (TRIPLE ANTIBIOTIC) [phone] OINT Apply 1 application topically as needed (for minor skin tears/abrasions).    Historical Provider, MD  nitroGLYCERIN (NITROSTAT) 0.4 MG SL tablet Place 0.4 mg under the tongue every 5 (five) minutes as needed for chest pain.     Historical Provider, MD    Family History Family History  Problem Relation Age of Onset  . Heart disease Father   . Hypertension Father   . Heart attack Father   . Kidney failure Brother   . Hyperlipidemia Daughter     Social History Social History  Substance Use Topics  . Smoking status: Former Smoker    Packs/day: 1.00    Years: 50.00    Types: Cigarettes    Quit date: 05/15/1991  . Smokeless tobacco: Never Used  . Alcohol use No     Allergies   Codeine   Review of Systems Review of Systems  Unable to perform ROS: Dementia  Constitutional: Negative for activity change.  Respiratory: Negative for shortness of breath.   Cardiovascular: Negative for chest pain.  Gastrointestinal: Negative for abdominal pain.     Physical Exam Updated Vital Signs BP 155/85   Pulse 92   Temp 98.4 F (36.9 C) (Oral)    Resp 14   Ht 5\' 1"  (1.549 m)   Wt 120 lb (54.4 kg)   SpO2 96%   BMI 22.67 kg/m   Physical Exam  Constitutional: She appears well-developed and well-nourished.  HENT:  Head: Normocephalic and atraumatic.  Bruising to L chin  Eyes:  Right eye exhibits no discharge.  Cardiovascular: Normal heart sounds.   No murmur heard. afib  Pulmonary/Chest: Effort normal and breath sounds normal. She has no wheezes. She has no rales.  Abdominal: Soft. She exhibits no distension. There is no tenderness.  Musculoskeletal:  Abrasion to L knee  Neurological: She is alert.  Skin: Skin is warm and dry. She is not diaphoretic.  Psychiatric: She has a normal mood and affect.  Nursing note and vitals reviewed.    ED Treatments / Results  Labs (all labs ordered are listed, but only abnormal results are displayed) Labs Reviewed  CBC WITH DIFFERENTIAL/PLATELET - Abnormal; Notable for the following:       Result Value   RDW 16.2 (*)    Neutro Abs 8.5 (*)    All other components within normal limits  COMPREHENSIVE METABOLIC PANEL - Abnormal; Notable for the following:    Potassium 3.4 (*)    Glucose, Bld 118 (*)    Total Protein 5.8 (*)    Albumin 3.3 (*)    ALT 11 (*)    GFR calc non Af Amer 51 (*)    GFR calc Af Amer 60 (*)    All other components within normal limits  URINALYSIS, ROUTINE W REFLEX MICROSCOPIC (NOT AT [facility]) - Abnormal; Notable for the following:    Bilirubin Urine SMALL (*)    Protein, ur 30 (*)    Leukocytes, UA TRACE (*)    All other components within normal limits  PROTIME-INR - Abnormal; Notable for the following:    Prothrombin Time 18.8 (*)    All other components within normal limits  URINE MICROSCOPIC-ADD ON - Abnormal; Notable for the following:    Squamous Epithelial / LPF 0-5 (*)    Bacteria, UA RARE (*)    All other components within normal limits  URINE CULTURE  I-STAT TROPOININ, ED    EKG  EKG Interpretation None       Radiology Dg Chest 2  View  Result Date: 04/04/2016 CLINICAL DATA:  Larey Seat today. EXAM: CHEST  2 VIEW COMPARISON:  03/20/2016 FINDINGS: Prior CABG. Cardiomegaly with mild vascular congestion. No confluent opacities, effusions or edema. No visible rib fracture or pneumothorax. IMPRESSION: Cardiomegaly with vascular congestion. Electronically Signed   By: Charlett Nose M.D.   On: 04/04/2016 09:29   Ct Head Wo Contrast  Result Date: 04/04/2016 CLINICAL DATA:  Fall from ladder yesterday and again falling this morning EXAM: CT HEAD WITHOUT CONTRAST CT MAXILLOFACIAL WITHOUT CONTRAST CT CERVICAL SPINE WITHOUT CONTRAST TECHNIQUE: Multidetector CT imaging of the head, cervical spine, and maxillofacial structures were performed using the standard protocol without intravenous contrast. Multiplanar CT image reconstructions of the cervical spine and maxillofacial structures were also generated. COMPARISON:  04/03/2016 FINDINGS: CT HEAD FINDINGS Brain: No evidence of acute infarction, hemorrhage, hydrocephalus, extra-axial collection or mass lesion/mass effect. Diffuse atrophic changes and chronic white matter ischemic changes are again noted. Vascular: No hyperdense vessel or unexpected calcification. Skull: Normal. Negative for fracture or focal lesion. Other: None. CT MAXILLOFACIAL FINDINGS Osseous: No fracture or mandibular dislocation. Some lucency is noted about the left central mandibular incisor and left mandibular canine similar to that noted on the prior exam. Some destruction of the left mandibular canine is noted also stable from the prior exam likely related to dental caries. Multiple missing teeth are seen. Dental caries are noted within the maxillary molars on the left and maxillary molar on the right. These changes are stable from the prior  exam. Orbits: Negative. No traumatic or inflammatory finding. Sinuses: Clear. Soft tissues: Mild soft tissue swelling is also noted in the area of the lip stable from the prior exam. No focal  hematoma is seen. CT CERVICAL SPINE FINDINGS Alignment: Cervical alignment is within normal limits. Skull base and vertebrae: 7 cervical segments are well visualized. Some degenerative changes are noted at the articulation of the odontoid and C1. Multilevel facet hypertrophic changes are seen. No acute fracture or acute facet abnormality is noted. Soft tissues and spinal canal: No prevertebral fluid or swelling. No visible canal hematoma. Disc levels: Disc space narrowing is noted at C6-7 with associated osteophytes. Very mild osteophytes are noted at multiple levels. Only mild neural foraminal narrowing is noted at C6-7 bilaterally. Upper chest: Within normal limits. IMPRESSION: CT of the head: Chronic atrophic and ischemic changes similar to that seen on the prior exam. CT of maxillofacial bones: Multiple dental caries as well as lucency surrounding the mandibular central incisor and canine similar to that noted on the prior exam. No acute fracture is seen. CT of the cervical spine: Multilevel degenerative change without acute abnormality. Electronically Signed   By: Alcide Clever M.D.   On: 04/04/2016 10:06   Ct Head Wo Contrast  Result Date: 04/03/2016 CLINICAL DATA:  Status post witnessed fall. Tripped over wheelchair and fell face down, with bruising to the chin. Neck pain. Patient on blood thinners. Concern for head injury. Initial encounter. EXAM: CT HEAD WITHOUT CONTRAST CT MAXILLOFACIAL WITHOUT CONTRAST CT CERVICAL SPINE WITHOUT CONTRAST TECHNIQUE: Multidetector CT imaging of the head, cervical spine, and maxillofacial structures were performed using the standard protocol without intravenous contrast. Multiplanar CT image reconstructions of the cervical spine and maxillofacial structures were also generated. COMPARISON:  CT of the head and cervical spine performed 03/20/2016, and MRI of the brain performed 10/02/2015 FINDINGS: CT HEAD FINDINGS Brain: No evidence of acute infarction, hemorrhage,  hydrocephalus, extra-axial collection or mass lesion/mass effect. Prominence of the ventricles and sulci reflects mild to moderate cortical volume loss. Mild cerebellar atrophy is noted. Scattered periventricular and subcortical white matter change likely reflects small vessel ischemic microangiopathy. Chronic lacunar infarcts are seen at the basal ganglia bilaterally. The brainstem and fourth ventricle are within normal limits. The cerebral hemispheres demonstrate grossly normal gray-white differentiation. No mass effect or midline shift is seen. Vascular: No hyperdense vessel or unexpected calcification. Skull: There is no evidence of fracture; visualized osseous structures are unremarkable in appearance. Other: No significant soft tissue abnormalities are seen. CT MAXILLOFACIAL FINDINGS Osseous: There is no evidence of fracture or dislocation. The maxilla and mandible appear intact. The nasal bone is unremarkable in appearance. 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 Orbits: The orbits are intact bilaterally. Sinuses: The visualized paranasal sinuses and mastoid air cells are well-aerated. Soft tissues: Soft tissue swelling is noted at the left medial lower lip and chin. The parapharyngeal fat planes are preserved. The nasopharynx, oropharynx and hypopharynx are unremarkable in appearance. The visualized portions of the valleculae and piriform sinuses are grossly unremarkable. The parotid and submandibular glands are within normal limits. No cervical lymphadenopathy is seen. CT CERVICAL SPINE FINDINGS Alignment: Normal. Skull base and vertebrae: No acute fracture. No primary bone lesion or focal pathologic process. Soft tissues and spinal canal: No prevertebral fluid or swelling. No visible canal hematoma. Disc levels: Minimal disc space narrowing is noted at C6-C7. Scattered anterior and posterior disc osteophyte complexes are seen along  the cervical spine. Degenerative change is noted about the dens. Upper chest: Mild interstitial prominence is noted at the lung apices. Scattered calcification is noted at the carotid bifurcations. The thyroid gland is mildly heterogeneous but otherwise unremarkable. Other: No additional soft tissue abnormalities are seen. IMPRESSION: 1. No evidence of traumatic intracranial injury or fracture. 2. No evidence of fracture or dislocation with regard to the maxillofacial structures. 3. No evidence of fracture or subluxation along the cervical spine. 4. Soft tissue swelling at the left medial lower lip and chin. 5. Mild to moderate cortical volume loss and scattered small vessel ischemic microangiopathy. Chronic lacunar infarcts at the basal ganglia bilaterally. 6. 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 7. Mild degenerative change along the cervical spine. 8. Mild interstitial prominence at the lung apices. 9. Scattered calcification at the carotid bifurcations. Carotid ultrasound could be considered for further evaluation, as deemed clinically appropriate. Electronically Signed   By: Roanna Raider M.D.   On: 04/03/2016 01:22   Ct Chest W Contrast  Result Date: 04/03/2016 CLINICAL DATA:  Trip and fall injury. Bruising to the abdomen. Patient is on blood thinners. EXAM: CT CHEST, ABDOMEN, AND PELVIS WITH CONTRAST TECHNIQUE: Multidetector CT imaging of the chest, abdomen and pelvis was performed following the standard protocol during bolus administration of intravenous contrast. CONTRAST:  75 ml ISOVUE-300 IOPAMIDOL (ISOVUE-300) INJECTION 61% COMPARISON:  CT chest 10/03/2015. CT abdomen and pelvis runoff angiographic study 10/02/2012 FINDINGS: CT CHEST FINDINGS Cardiovascular: Cardiac enlargement. Reflux of contrast material into the hepatic veins suggest right heart failure. No pericardial effusion. Postoperative changes in the mediastinum. Coronary  artery calcifications. Calcific and noncalcific atherosclerotic changes in the thoracic aorta. No aortic aneurysm or dissection. Central pulmonary arteries are well opacified without evidence of significant pulmonary embolus. Right axillary femoral bypass graft appears patent. Mediastinum/Nodes: No enlarged mediastinal, hilar, or axillary lymph nodes. Thyroid gland, trachea, and esophagus demonstrate no significant findings. Lungs/Pleura: Motion artifact limits examination. Mild interstitial reticular nodular pattern to the lungs probably represents mild edema. This is improved since prior study. Small bilateral pleural effusions. No focal consolidation. Airways are patent. No pneumothorax. Musculoskeletal: Degenerative changes throughout the thoracic spine. No vertebral compression deformities. Sternotomy wires are in place. Sternum appears intact. Degenerative changes in the shoulders. Fracture of the left posterior twelfth rib with mild displacement. This appears acute. CT ABDOMEN PELVIS FINDINGS Hepatobiliary: The gallbladder wall is somewhat indistinct. No stones are identified. Inflammatory process not excluded. Small amount of fluid around the liver edge. No discrete laceration identified. No bile duct dilatation. Pancreas: Unremarkable. No pancreatic ductal dilatation or surrounding inflammatory changes. Spleen: No splenic injury or perisplenic hematoma. Adrenals/Urinary Tract: No adrenal hemorrhage or renal injury identified. Bladder is unremarkable. Stomach/Bowel: Stomach, small bowel, and colon are not abnormally distended. Scattered stool throughout the colon. Colonic diverticulosis without evidence of diverticulitis. Vascular/Lymphatic: Diffuse atherosclerotic calcification throughout the abdominal aorta and branch vessels. Distal abdominal aorta is occluded. Minimal if any flow is demonstrated in the native iliac and external iliac arteries. Femoral to femoral bypass graft is patent. Reproductive:  Status post hysterectomy. No adnexal masses. Other: Diffuse edema throughout the subcutaneous fat. No free air in the abdomen. Musculoskeletal: Acute fractures of the left transverse process of L1 and L2. Diffuse degenerative changes throughout the spine. No vertebral compression deformities. Sacrum, pelvis, and hips appear intact. IMPRESSION: Cardiac enlargement with mild edema and small bilateral pleural effusions. Diffuse soft tissue edema in the subcutaneous fat.  Small amount of fluid around the liver may be posttraumatic but no discrete laceration is identified. Edema in the gallbladder wall is nonspecific and could be inflammatory. No stones identified. No evidence of bowel perforation. Acute fractures of the left posterior twelfth rib and of the left transverse processes at L1 and L2. Aortic atherosclerosis with distal aortic occlusion. Patent right axillary to femoral and femoral to femoral bypass grafts. Electronically Signed   By: Burman Nieves M.D.   On: 04/03/2016 01:30   Ct Cervical Spine Wo Contrast  Result Date: 04/04/2016 CLINICAL DATA:  Fall from ladder yesterday and again falling this morning EXAM: CT HEAD WITHOUT CONTRAST CT MAXILLOFACIAL WITHOUT CONTRAST CT CERVICAL SPINE WITHOUT CONTRAST TECHNIQUE: Multidetector CT imaging of the head, cervical spine, and maxillofacial structures were performed using the standard protocol without intravenous contrast. Multiplanar CT image reconstructions of the cervical spine and maxillofacial structures were also generated. COMPARISON:  04/03/2016 FINDINGS: CT HEAD FINDINGS Brain: No evidence of acute infarction, hemorrhage, hydrocephalus, extra-axial collection or mass lesion/mass effect. Diffuse atrophic changes and chronic white matter ischemic changes are again noted. Vascular: No hyperdense vessel or unexpected calcification. Skull: Normal. Negative for fracture or focal lesion. Other: None. CT MAXILLOFACIAL FINDINGS Osseous: No fracture or  mandibular dislocation. Some lucency is noted about the left central mandibular incisor and left mandibular canine similar to that noted on the prior exam. Some destruction of the left mandibular canine is noted also stable from the prior exam likely related to dental caries. Multiple missing teeth are seen. Dental caries are noted within the maxillary molars on the left and maxillary molar on the right. These changes are stable from the prior exam. Orbits: Negative. No traumatic or inflammatory finding. Sinuses: Clear. Soft tissues: Mild soft tissue swelling is also noted in the area of the lip stable from the prior exam. No focal hematoma is seen. CT CERVICAL SPINE FINDINGS Alignment: Cervical alignment is within normal limits. Skull base and vertebrae: 7 cervical segments are well visualized. Some degenerative changes are noted at the articulation of the odontoid and C1. Multilevel facet hypertrophic changes are seen. No acute fracture or acute facet abnormality is noted. Soft tissues and spinal canal: No prevertebral fluid or swelling. No visible canal hematoma. Disc levels: Disc space narrowing is noted at C6-7 with associated osteophytes. Very mild osteophytes are noted at multiple levels. Only mild neural foraminal narrowing is noted at C6-7 bilaterally. Upper chest: Within normal limits. IMPRESSION: CT of the head: Chronic atrophic and ischemic changes similar to that seen on the prior exam. CT of maxillofacial bones: Multiple dental caries as well as lucency surrounding the mandibular central incisor and canine similar to that noted on the prior exam. No acute fracture is seen. CT of the cervical spine: Multilevel degenerative change without acute abnormality. Electronically Signed   By: Alcide Clever M.D.   On: 04/04/2016 10:06   Ct Cervical Spine Wo Contrast  Result Date: 04/03/2016 CLINICAL DATA:  Status post witnessed fall. Tripped over wheelchair and fell face down, with bruising to the chin. Neck  pain. Patient on blood thinners. Concern for head injury. Initial encounter. EXAM: CT HEAD WITHOUT CONTRAST CT MAXILLOFACIAL WITHOUT CONTRAST CT CERVICAL SPINE WITHOUT CONTRAST TECHNIQUE: Multidetector CT imaging of the head, cervical spine, and maxillofacial structures were performed using the standard protocol without intravenous contrast. Multiplanar CT image reconstructions of the cervical spine and maxillofacial structures were also generated. COMPARISON:  CT of the head and cervical spine performed 03/20/2016, and MRI of  the brain performed 10/02/2015 FINDINGS: CT HEAD FINDINGS Brain: No evidence of acute infarction, hemorrhage, hydrocephalus, extra-axial collection or mass lesion/mass effect. Prominence of the ventricles and sulci reflects mild to moderate cortical volume loss. Mild cerebellar atrophy is noted. Scattered periventricular and subcortical white matter change likely reflects small vessel ischemic microangiopathy. Chronic lacunar infarcts are seen at the basal ganglia bilaterally. The brainstem and fourth ventricle are within normal limits. The cerebral hemispheres demonstrate grossly normal gray-white differentiation. No mass effect or midline shift is seen. Vascular: No hyperdense vessel or unexpected calcification. Skull: There is no evidence of fracture; visualized osseous structures are unremarkable in appearance. Other: No significant soft tissue abnormalities are seen. CT MAXILLOFACIAL FINDINGS Osseous: There is no evidence of fracture or dislocation. The maxilla and mandible appear intact. The nasal bone is unremarkable in appearance. 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 Orbits: The orbits are intact bilaterally. Sinuses: The visualized paranasal sinuses and mastoid air cells are well-aerated. Soft tissues: Soft tissue swelling is noted at the left medial lower lip and chin. The parapharyngeal fat planes are  preserved. The nasopharynx, oropharynx and hypopharynx are unremarkable in appearance. The visualized portions of the valleculae and piriform sinuses are grossly unremarkable. The parotid and submandibular glands are within normal limits. No cervical lymphadenopathy is seen. CT CERVICAL SPINE FINDINGS Alignment: Normal. Skull base and vertebrae: No acute fracture. No primary bone lesion or focal pathologic process. Soft tissues and spinal canal: No prevertebral fluid or swelling. No visible canal hematoma. Disc levels: Minimal disc space narrowing is noted at C6-C7. Scattered anterior and posterior disc osteophyte complexes are seen along the cervical spine. Degenerative change is noted about the dens. Upper chest: Mild interstitial prominence is noted at the lung apices. Scattered calcification is noted at the carotid bifurcations. The thyroid gland is mildly heterogeneous but otherwise unremarkable. Other: No additional soft tissue abnormalities are seen. IMPRESSION: 1. No evidence of traumatic intracranial injury or fracture. 2. No evidence of fracture or dislocation with regard to the maxillofacial structures. 3. No evidence of fracture or subluxation along the cervical spine. 4. Soft tissue swelling at the left medial lower lip and chin. 5. Mild to moderate cortical volume loss and scattered small vessel ischemic microangiopathy. Chronic lacunar infarcts at the basal ganglia bilaterally. 6. 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 7. Mild degenerative change along the cervical spine. 8. Mild interstitial prominence at the lung apices. 9. Scattered calcification at the carotid bifurcations. Carotid ultrasound could be considered for further evaluation, as deemed clinically appropriate. Electronically Signed   By: Roanna RaiderJeffery  Chang M.D.   On: 04/03/2016 01:22   Ct Abdomen Pelvis W Contrast  Result Date: 04/03/2016 CLINICAL DATA:  Trip and fall  injury. Bruising to the abdomen. Patient is on blood thinners. EXAM: CT CHEST, ABDOMEN, AND PELVIS WITH CONTRAST TECHNIQUE: Multidetector CT imaging of the chest, abdomen and pelvis was performed following the standard protocol during bolus administration of intravenous contrast. CONTRAST:  75 ml ISOVUE-300 IOPAMIDOL (ISOVUE-300) INJECTION 61% COMPARISON:  CT chest 10/03/2015. CT abdomen and pelvis runoff angiographic study 10/02/2012 FINDINGS: CT CHEST FINDINGS Cardiovascular: Cardiac enlargement. Reflux of contrast material into the hepatic veins suggest right heart failure. No pericardial effusion. Postoperative changes in the mediastinum. Coronary artery calcifications. Calcific and noncalcific atherosclerotic changes in the thoracic aorta. No aortic aneurysm or dissection. Central pulmonary arteries are well opacified without evidence of significant  pulmonary embolus. Right axillary femoral bypass graft appears patent. Mediastinum/Nodes: No enlarged mediastinal, hilar, or axillary lymph nodes. Thyroid gland, trachea, and esophagus demonstrate no significant findings. Lungs/Pleura: Motion artifact limits examination. Mild interstitial reticular nodular pattern to the lungs probably represents mild edema. This is improved since prior study. Small bilateral pleural effusions. No focal consolidation. Airways are patent. No pneumothorax. Musculoskeletal: Degenerative changes throughout the thoracic spine. No vertebral compression deformities. Sternotomy wires are in place. Sternum appears intact. Degenerative changes in the shoulders. Fracture of the left posterior twelfth rib with mild displacement. This appears acute. CT ABDOMEN PELVIS FINDINGS Hepatobiliary: The gallbladder wall is somewhat indistinct. No stones are identified. Inflammatory process not excluded. Small amount of fluid around the liver edge. No discrete laceration identified. No bile duct dilatation. Pancreas: Unremarkable. No pancreatic ductal  dilatation or surrounding inflammatory changes. Spleen: No splenic injury or perisplenic hematoma. Adrenals/Urinary Tract: No adrenal hemorrhage or renal injury identified. Bladder is unremarkable. Stomach/Bowel: Stomach, small bowel, and colon are not abnormally distended. Scattered stool throughout the colon. Colonic diverticulosis without evidence of diverticulitis. Vascular/Lymphatic: Diffuse atherosclerotic calcification throughout the abdominal aorta and branch vessels. Distal abdominal aorta is occluded. Minimal if any flow is demonstrated in the native iliac and external iliac arteries. Femoral to femoral bypass graft is patent. Reproductive: Status post hysterectomy. No adnexal masses. Other: Diffuse edema throughout the subcutaneous fat. No free air in the abdomen. Musculoskeletal: Acute fractures of the left transverse process of L1 and L2. Diffuse degenerative changes throughout the spine. No vertebral compression deformities. Sacrum, pelvis, and hips appear intact. IMPRESSION: Cardiac enlargement with mild edema and small bilateral pleural effusions. Diffuse soft tissue edema in the subcutaneous fat. Small amount of fluid around the liver may be posttraumatic but no discrete laceration is identified. Edema in the gallbladder wall is nonspecific and could be inflammatory. No stones identified. No evidence of bowel perforation. Acute fractures of the left posterior twelfth rib and of the left transverse processes at L1 and L2. Aortic atherosclerosis with distal aortic occlusion. Patent right axillary to femoral and femoral to femoral bypass grafts. Electronically Signed   By: Burman NievesWilliam  Stevens M.D.   On: 04/03/2016 01:30   Dg Knee Complete 4 Views Left  Result Date: 04/03/2016 CLINICAL DATA:  Status post fall at nursing home EXAM: LEFT KNEE - COMPLETE 4+ VIEW COMPARISON:  None. FINDINGS: There is no acute fracture or dislocation of the left knee. There is lateral compartment chondrocalcinosis. There  is moderate medial compartment narrowing with medial and lateral marginal osteophytosis. No knee effusion. Small fragment adjacent to the medial tibial condyles favored to be chronic and possibly related to chronic tendinitis. IMPRESSION: 1. No acute fracture or dislocation of the left knee. 2. Moderate medial compartment predominant osteoarthrosis and lateral compartment chondrocalcinosis. Electronically Signed   By: Deatra RobinsonKevin  Herman M.D.   On: 04/03/2016 00:04   Ct Maxillofacial Wo Contrast  Result Date: 04/04/2016 CLINICAL DATA:  Fall from ladder yesterday and again falling this morning EXAM: CT HEAD WITHOUT CONTRAST CT MAXILLOFACIAL WITHOUT CONTRAST CT CERVICAL SPINE WITHOUT CONTRAST TECHNIQUE: Multidetector CT imaging of the head, cervical spine, and maxillofacial structures were performed using the standard protocol without intravenous contrast. Multiplanar CT image reconstructions of the cervical spine and maxillofacial structures were also generated. COMPARISON:  04/03/2016 FINDINGS: CT HEAD FINDINGS Brain: No evidence of acute infarction, hemorrhage, hydrocephalus, extra-axial collection or mass lesion/mass effect. Diffuse atrophic changes and chronic white matter ischemic changes are again noted. Vascular: No hyperdense vessel or unexpected  calcification. Skull: Normal. Negative for fracture or focal lesion. Other: None. CT MAXILLOFACIAL FINDINGS Osseous: No fracture or mandibular dislocation. Some lucency is noted about the left central mandibular incisor and left mandibular canine similar to that noted on the prior exam. Some destruction of the left mandibular canine is noted also stable from the prior exam likely related to dental caries. Multiple missing teeth are seen. Dental caries are noted within the maxillary molars on the left and maxillary molar on the right. These changes are stable from the prior exam. Orbits: Negative. No traumatic or inflammatory finding. Sinuses: Clear. Soft tissues: Mild  soft tissue swelling is also noted in the area of the lip stable from the prior exam. No focal hematoma is seen. CT CERVICAL SPINE FINDINGS Alignment: Cervical alignment is within normal limits. Skull base and vertebrae: 7 cervical segments are well visualized. Some degenerative changes are noted at the articulation of the odontoid and C1. Multilevel facet hypertrophic changes are seen. No acute fracture or acute facet abnormality is noted. Soft tissues and spinal canal: No prevertebral fluid or swelling. No visible canal hematoma. Disc levels: Disc space narrowing is noted at C6-7 with associated osteophytes. Very mild osteophytes are noted at multiple levels. Only mild neural foraminal narrowing is noted at C6-7 bilaterally. Upper chest: Within normal limits. IMPRESSION: CT of the head: Chronic atrophic and ischemic changes similar to that seen on the prior exam. CT of maxillofacial bones: Multiple dental caries as well as lucency surrounding the mandibular central incisor and canine similar to that noted on the prior exam. No acute fracture is seen. CT of the cervical spine: Multilevel degenerative change without acute abnormality. Electronically Signed   By: Alcide Clever M.D.   On: 04/04/2016 10:06   Ct Maxillofacial Wo Contrast  Result Date: 04/03/2016 CLINICAL DATA:  Status post witnessed fall. Tripped over wheelchair and fell face down, with bruising to the chin. Neck pain. Patient on blood thinners. Concern for head injury. Initial encounter. EXAM: CT HEAD WITHOUT CONTRAST CT MAXILLOFACIAL WITHOUT CONTRAST CT CERVICAL SPINE WITHOUT CONTRAST TECHNIQUE: Multidetector CT imaging of the head, cervical spine, and maxillofacial structures were performed using the standard protocol without intravenous contrast. Multiplanar CT image reconstructions of the cervical spine and maxillofacial structures were also generated. COMPARISON:  CT of the head and cervical spine performed 03/20/2016, and MRI of the brain  performed 10/02/2015 FINDINGS: CT HEAD FINDINGS Brain: No evidence of acute infarction, hemorrhage, hydrocephalus, extra-axial collection or mass lesion/mass effect. Prominence of the ventricles and sulci reflects mild to moderate cortical volume loss. Mild cerebellar atrophy is noted. Scattered periventricular and subcortical white matter change likely reflects small vessel ischemic microangiopathy. Chronic lacunar infarcts are seen at the basal ganglia bilaterally. The brainstem and fourth ventricle are within normal limits. The cerebral hemispheres demonstrate grossly normal gray-white differentiation. No mass effect or midline shift is seen. Vascular: No hyperdense vessel or unexpected calcification. Skull: There is no evidence of fracture; visualized osseous structures are unremarkable in appearance. Other: No significant soft tissue abnormalities are seen. CT MAXILLOFACIAL FINDINGS Osseous: There is no evidence of fracture or dislocation. The maxilla and mandible appear intact. The nasal bone is unremarkable in appearance. 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 Orbits: The orbits are intact bilaterally. Sinuses: The visualized paranasal sinuses and mastoid air cells are well-aerated. Soft tissues: Soft tissue swelling is noted at the left medial lower lip and chin. The parapharyngeal fat planes are  preserved. The nasopharynx, oropharynx and hypopharynx are unremarkable in appearance. The visualized portions of the valleculae and piriform sinuses are grossly unremarkable. The parotid and submandibular glands are within normal limits. No cervical lymphadenopathy is seen. CT CERVICAL SPINE FINDINGS Alignment: Normal. Skull base and vertebrae: No acute fracture. No primary bone lesion or focal pathologic process. Soft tissues and spinal canal: No prevertebral fluid or swelling. No visible canal hematoma. Disc levels: Minimal disc space  narrowing is noted at C6-C7. Scattered anterior and posterior disc osteophyte complexes are seen along the cervical spine. Degenerative change is noted about the dens. Upper chest: Mild interstitial prominence is noted at the lung apices. Scattered calcification is noted at the carotid bifurcations. The thyroid gland is mildly heterogeneous but otherwise unremarkable. Other: No additional soft tissue abnormalities are seen. IMPRESSION: 1. No evidence of traumatic intracranial injury or fracture. 2. No evidence of fracture or dislocation with regard to the maxillofacial structures. 3. No evidence of fracture or subluxation along the cervical spine. 4. Soft tissue swelling at the left medial lower lip and chin. 5. Mild to moderate cortical volume loss and scattered small vessel ischemic microangiopathy. Chronic lacunar infarcts at the basal ganglia bilaterally. 6. 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 7. Mild degenerative change along the cervical spine. 8. Mild interstitial prominence at the lung apices. 9. Scattered calcification at the carotid bifurcations. Carotid ultrasound could be considered for further evaluation, as deemed clinically appropriate. Electronically Signed   By: Roanna Raider M.D.   On: 04/03/2016 01:22    Procedures Procedures (including critical care time)  Medications Ordered in ED Medications - No data to display   Initial Impression / Assessment and Plan / ED Course  I have reviewed the triage vital signs and the nursing notes.  Pertinent labs & imaging results that were available during my care of the patient were reviewed by me and considered in my medical decision making (see chart for details).  Clinical Course     Pt is [name] Short is a 82 y.o. female with a hx of A-Fib, vascular dementia, Type II DM, CAD, stroke, and MI brought in by EMS from [facility] nursing home who presents to the Emergency  Department complaining of a fall occurring PTA.  Pt here with 2nd fall in 2 days, will repeat some imaging including CT hea dand neck given risk. Will get CXR sicne she had recent fracture- to ruleout a developing pna. Will get UA.  Will get labs tomake sure she has nto had any sig internal bleedig since last work up.  Patient has normal vitals and appears baseline except for bruising.    CT and lab worke, PE and vitals are all reassuring. Pt has no complaints at this time.  Patient is comfortable, ambulatory, and taking PO at time of discharge.     Final Clinical Impressions(s) / ED Diagnoses   Final diagnoses:  Fall in home, initial encounter    New Prescriptions Discharge Medication List as of 04/04/2016 12:32 PM       Roneka Gilpin Randall An, MD 04/04/16 1520

## 2016-04-05 LAB — URINE CULTURE: Culture: NO GROWTH

## 2016-04-10 ENCOUNTER — Ambulatory Visit: Payer: Medicare Other | Admitting: Nurse Practitioner

## 2016-04-16 ENCOUNTER — Emergency Department (HOSPITAL_COMMUNITY)
Admission: EM | Admit: 2016-04-16 | Discharge: 2016-04-16 | Disposition: A | Discharge status: Home / Self Care | Payer: Medicare Other | Attending: Emergency Medicine | Admitting: Emergency Medicine

## 2016-04-16 ENCOUNTER — Encounter (HOSPITAL_COMMUNITY): Payer: Self-pay

## 2016-04-16 ENCOUNTER — Emergency Department (HOSPITAL_COMMUNITY): Payer: Medicare Other

## 2016-04-16 DIAGNOSIS — E039 Hypothyroidism, unspecified: Secondary | ICD-10-CM | POA: Insufficient documentation

## 2016-04-16 DIAGNOSIS — E119 Type 2 diabetes mellitus without complications: Secondary | ICD-10-CM | POA: Insufficient documentation

## 2016-04-16 DIAGNOSIS — Z7982 Long term (current) use of aspirin: Secondary | ICD-10-CM | POA: Insufficient documentation

## 2016-04-16 DIAGNOSIS — Z96652 Presence of left artificial knee joint: Secondary | ICD-10-CM | POA: Insufficient documentation

## 2016-04-16 DIAGNOSIS — Y929 Unspecified place or not applicable: Secondary | ICD-10-CM | POA: Diagnosis not present

## 2016-04-16 DIAGNOSIS — Z79899 Other long term (current) drug therapy: Secondary | ICD-10-CM | POA: Insufficient documentation

## 2016-04-16 DIAGNOSIS — Z8673 Personal history of transient ischemic attack (TIA), and cerebral infarction without residual deficits: Secondary | ICD-10-CM | POA: Diagnosis not present

## 2016-04-16 DIAGNOSIS — I2510 Atherosclerotic heart disease of native coronary artery without angina pectoris: Secondary | ICD-10-CM | POA: Insufficient documentation

## 2016-04-16 DIAGNOSIS — Z87891 Personal history of nicotine dependence: Secondary | ICD-10-CM | POA: Insufficient documentation

## 2016-04-16 DIAGNOSIS — S0083XA Contusion of other part of head, initial encounter: Secondary | ICD-10-CM | POA: Insufficient documentation

## 2016-04-16 DIAGNOSIS — W19XXXA Unspecified fall, initial encounter: Secondary | ICD-10-CM | POA: Insufficient documentation

## 2016-04-16 DIAGNOSIS — Y999 Unspecified external cause status: Secondary | ICD-10-CM | POA: Diagnosis not present

## 2016-04-16 DIAGNOSIS — I252 Old myocardial infarction: Secondary | ICD-10-CM | POA: Diagnosis not present

## 2016-04-16 DIAGNOSIS — I509 Heart failure, unspecified: Secondary | ICD-10-CM | POA: Diagnosis not present

## 2016-04-16 DIAGNOSIS — I110 Hypertensive heart disease with heart failure: Secondary | ICD-10-CM | POA: Insufficient documentation

## 2016-04-16 DIAGNOSIS — Y939 Activity, unspecified: Secondary | ICD-10-CM | POA: Insufficient documentation

## 2016-04-16 DIAGNOSIS — Z951 Presence of aortocoronary bypass graft: Secondary | ICD-10-CM | POA: Diagnosis not present

## 2016-04-16 DIAGNOSIS — S0990XA Unspecified injury of head, initial encounter: Secondary | ICD-10-CM | POA: Diagnosis present

## 2016-04-16 NOTE — ED Triage Notes (Signed)
Pt. Coming from [facility] for unwitnessed fall today. Pt. Helped back into her chair by staff. Pt. On eliquis for afib. Pt. Has no complaints at this time. Pt. Baseline for dementia per staff. Pt. Oriented to person and place, but not time or situation.

## 2016-04-16 NOTE — ED Notes (Signed)
Transferred pt from D32 to hallway per Denny PeonErin Banker(RN)

## 2016-04-16 NOTE — ED Provider Notes (Signed)
[facility]-EMERGENCY DEPT Provider Note   CSN: [id number] Arrival date & time: 04/16/16  1100     History   Chief Complaint Chief Complaint  Patient presents with  . Fall    HPI [name] Short is a 82 y.o. female.  The history is provided by the nursing home.  Fall  This is a recurrent problem. Episode onset: unknown. The problem occurs every several days. The problem has not changed since onset.Pertinent negatives include no chest pain, no abdominal pain, no headaches and no shortness of breath. Nothing aggravates the symptoms. Nothing relieves the symptoms. She has tried nothing for the symptoms.    Past Medical History:  Diagnosis Date  . Anginal pain (HCC)    not in in last month  . Anxiety   . Arthritis    "hands" (10/03/2015)  . Atrial fibrillation (HCC)   . CAD (coronary artery disease) 03/01/2008   Lexiscan - EF 79, no ECG changes/EKG negative for ischemia, post-stress EF 86  . Cerebral atherosclerosis 10/15/2011   Extracranial Examination - mild-moderate amount smooth mixed density plaque elevating velocities within the proximal and mid segments of the internal carotid artery  . Chest pain 10/07/2009   2D Echo - EF 50-55, mitral valve calcified annulus  . CHF (congestive heart failure) (HCC)   . Chronic bronchitis (HCC)   . Claudication (HCC) 09/01/2010   LEA Doppler - Bilateral ABIs-demonstrated moderate arterial insufficiency to the lower extremities at rest, Right CIA/EIA-known occlusive disease, PV Cath  . Complication of anesthesia    "09/29/2015 put to sleep to take out wisdom tooth; took >12h to get her awake" (10/03/2015  . Coronary artery disease   . Depression   . Dyspnea 06/01/2012   2D Echo - EF 60-65, mitral valve calcified annulus, left atrium severely dilated, right atrium moderately dilated  . GERD (gastroesophageal reflux disease)    takes prevacid  . High cholesterol   . Hypertension   . Hypothyroidism   . Memory loss   . Myocardial infarction   . PVD  (peripheral vascular disease) (HCC)   . Stroke [facility](HCC) ?10/02/2015  . Thyroid disease   . Type II diabetes mellitus (HCC)   . Vascular dementia     Patient Active Problem List   Diagnosis Date Noted  . Acute CVA (cerebrovascular accident) (HCC) 10/03/2015  . Cerebellar stroke (HCC)   . Paroxysmal atrial fibrillation (HCC)   . HLD (hyperlipidemia)   . Stroke (HCC) 10/02/2015  . Acute embolic stroke (HCC) 10/02/2015  . Stroke (cerebrum) (HCC) 10/02/2015  . Acute respiratory failure with hypoxia (HCC) 10/02/2015  . Essential hypertension 03/12/2013  . Atherosclerosis of native arteries of the extremities with intermittent claudication 11/04/2012  . Preoperative clearance 09/25/2012  . CAD (coronary artery disease) of artery bypass graft 09/25/2012  . Peripheral vascular disease, unspecified 09/23/2012  . Aftercare following surgery of the circulatory system, NEC 09/23/2012  . Chest pain 08/11/2012  . Chronic atrial fibrillation- ventricular rate is controlled 08/11/2012  . Dyslipidemia 06/03/2012  . PVD (peripheral vascular disease), AOBF '94 06/03/2012  . NSVT, EF 60-65% by 2D this admission, beta blocker increased 06/03/2012  . CAD, CABG X 2 '93- Last cath 05/2012- no change from 2011, medical Rx- EF of 60-65% via echo 05/2012 06/03/2012  . CHF (congestive heart failure) - mild, BNP 3K 05/31/2012  . Unstable angina (HCC) 05/31/2012  . Diabetes mellitus type II, controlled (HCC) 05/31/2012  . Atrial fibrillation with controlled ventricular response (HCC) 05/31/2012    Past Surgical  History:  Procedure Laterality Date  . ABDOMINAL HYSTERECTOMY    . ANGIOPLASTY Left 02/23/2010   Darcon patch, 80-90% stenosis proximal to the anastomosis  . AORTA - BILATERAL FEMORAL ARTERY BYPASS GRAFT  2011  . AXILLARY-FEMORAL BYPASS GRAFT Right 10/08/2012   Procedure: BYPASS GRAFT AXILLA-BIFEMORAL;  Surgeon: Pryor Ochoa, MD;  Location: [facility] OR;  Service: Vascular;  Laterality: Right;  . CARDIAC  CATHETERIZATION  2012  . CARDIAC CATHETERIZATION  06/02/2012   Unchanged anatomy compared to 3 years ago, continue medical therapy  . CARDIAC CATHETERIZATION  10/06/2009   Occluded right coronary artery, left-to-right collateral noncritical left disease with occluded grafts  . CATARACT EXTRACTION W/ INTRAOCULAR LENS  IMPLANT, BILATERAL Bilateral   . CORONARY ANGIOPLASTY    . CORONARY ARTERY BYPASS GRAFT  1993  . EYE SURGERY    . JOINT REPLACEMENT    . LEFT HEART CATHETERIZATION WITH CORONARY/GRAFT ANGIOGRAM N/A 06/02/2012   Procedure: LEFT HEART CATHETERIZATION WITH Isabel Caprice;  Surgeon: Runell Gess, MD;  Location: [facility] CATH LAB;  Service: Cardiovascular;  Laterality: N/A;  . TOTAL KNEE ARTHROPLASTY Left 2010    OB History    No data available       Home Medications    Prior to Admission medications   Medication Sig Start Date End Date Taking? Authorizing Provider  apixaban (ELIQUIS) 2.5 MG TABS tablet Take 1 tablet (2.5 mg total) by mouth 2 (two) times daily. 10/05/15  Yes Rhetta Mura, MD  aspirin EC 81 MG tablet Take 81 mg by mouth daily.   Yes Historical Provider, MD  busPIRone (BUSPAR) 15 MG tablet Take 15 mg by mouth 2 (two) times daily.   Yes Historical Provider, MD  calcium-vitamin D (OSCAL WITH D) 500-200 MG-UNIT tablet Take 1 tablet by mouth 2 (two) times daily.   Yes Historical Provider, MD  cetirizine (ZYRTEC) 10 MG tablet Take 10 mg by mouth daily.   Yes Historical Provider, MD  diphenhydrAMINE-zinc acetate (BENADRYL) cream Apply 1 application topically 2 (two) times daily.   Yes Historical Provider, MD  famotidine (PEPCID) 20 MG tablet Take 20 mg by mouth daily.   Yes Historical Provider, MD  furosemide (LASIX) 20 MG tablet Take 20 mg by mouth daily.   Yes Historical Provider, MD  HYDROcodone-acetaminophen (NORCO/VICODIN) 5-325 MG tablet Take 0.5 to 1 tablet by mouth every 6 (six) hours as needed for severe pain. 03/20/16  Yes Jaime Pilcher Ward, PA-C   levothyroxine (SYNTHROID, LEVOTHROID) 25 MCG tablet Take 37.5 mcg by mouth daily before breakfast.   Yes Historical Provider, MD  losartan (COZAAR) 100 MG tablet Take 100 mg by mouth daily.   Yes Historical Provider, MD  metoprolol succinate (TOPROL-XL) 25 MG 24 hr tablet Take 25 mg by mouth at bedtime.   Yes Historical Provider, MD  omega-3 acid ethyl esters (LOVAZA) 1 G capsule Take 2 g by mouth daily.   Yes Historical Provider, MD  PARoxetine (PAXIL) 30 MG tablet Take 30 mg by mouth daily.   Yes Historical Provider, MD  polyvinyl alcohol (LIQUIFILM TEARS) 1.4 % ophthalmic solution Place 1 drop into both eyes 4 (four) times daily.    Yes Historical Provider, MD  potassium chloride SA (K-DUR,KLOR-CON) 20 MEQ tablet Take 20 mEq by mouth daily.   Yes Historical Provider, MD  ranolazine (RANEXA) 500 MG 12 hr tablet Take 1 tablet (500 mg total) by mouth 2 (two) times daily. 08/12/12  Yes Brittainy Sherlynn Carbon, PA-C  rivastigmine (EXELON) 4.6 mg/24hr Place 4.6  mg onto the skin daily.   Yes Historical Provider, MD  senna (SENOKOT) 8.6 MG tablet Take 1 tablet by mouth at bedtime.   Yes Historical Provider, MD  simvastatin (ZOCOR) 10 MG tablet Take 10 mg by mouth at bedtime.    Yes Historical Provider, MD  traZODone (DESYREL) 50 MG tablet Take 75 mg by mouth at bedtime.   Yes Historical Provider, MD  acetaminophen (TYLENOL) 325 MG tablet Take 650 mg by mouth 4 (four) times daily.     Historical Provider, MD  acetaminophen (TYLENOL) 500 MG tablet Take 500 mg by mouth every 4 (four) hours as needed for mild pain, moderate pain, fever or headache.    Historical Provider, MD  albuterol (PROVENTIL HFA;VENTOLIN HFA) 108 (90 BASE) MCG/ACT inhaler Inhale 1-2 puffs into the lungs every 6 (six) hours as needed for wheezing. Patient not taking: Reported on 04/16/2016 12/31/12   Marlon Pel, PA-C  alum & mag hydroxide-simeth (MINTOX) 200-200-20 MG/5ML suspension Take 30 mLs by mouth every 6 (six) hours as needed for  indigestion or heartburn.    Historical Provider, MD  cetirizine (ZYRTEC) 10 MG tablet Take 10 mg by mouth daily.    Historical Provider, MD  guaifenesin (ROBITUSSIN) 100 MG/5ML syrup Take 200 mg by mouth every 6 (six) hours as needed for cough.     Historical Provider, MD  Ipratropium-Albuterol (COMBIVENT RESPIMAT) 20-100 MCG/ACT AERS respimat Inhale 1 puff into the lungs every 6 (six) hours as needed for wheezing or shortness of breath.     Historical Provider, MD  loperamide (IMODIUM) 2 MG capsule Take 2 mg by mouth as needed for diarrhea or loose stools.     Historical Provider, MD  magnesium hydroxide (MILK OF MAGNESIA) 400 MG/5ML suspension Take 30 mLs by mouth at bedtime as needed for mild constipation.     Historical Provider, MD  Neomycin-Bacitracin-Polymyxin (TRIPLE ANTIBIOTIC) [phone] OINT Apply 1 application topically as needed (for minor skin tears/abrasions).    Historical Provider, MD  nitroGLYCERIN (NITROSTAT) 0.4 MG SL tablet Place 0.4 mg under the tongue every 5 (five) minutes as needed for chest pain.     Historical Provider, MD    Family History Family History  Problem Relation Age of Onset  . Heart disease Father   . Hypertension Father   . Heart attack Father   . Kidney failure Brother   . Hyperlipidemia Daughter     Social History Social History  Substance Use Topics  . Smoking status: Former Smoker    Packs/day: 1.00    Years: 50.00    Types: Cigarettes    Quit date: 05/15/1991  . Smokeless tobacco: Never Used  . Alcohol use No     Allergies   Codeine   Review of Systems Review of Systems  Unable to perform ROS: Dementia  Respiratory: Negative for shortness of breath.   Cardiovascular: Negative for chest pain.  Gastrointestinal: Negative for abdominal pain.  Neurological: Negative for headaches.   Level 5 exception to history: dementia   Physical Exam Updated Vital Signs BP (!) 168/116 Comment: Simultaneous filing. User may not have seen  previous data.  Pulse 104   Temp 97.9 F (36.6 C) (Oral)   Resp 13 Comment: Simultaneous filing. User may not have seen previous data.  Ht 5\' 1"  (1.549 m)   Wt 101 lb (45.8 kg)   SpO2 94%   BMI 19.08 kg/m   Physical Exam  Constitutional: She appears well-developed and well-nourished. No distress.  HENT:  Head:  Normocephalic.  Nose: Nose normal.  Ecchymosis over left lower face and left cheek  Eyes: Conjunctivae are normal.  Neck: Neck supple. No tracheal deviation present.  Cardiovascular: Normal rate and regular rhythm.   Pulmonary/Chest: Effort normal. No respiratory distress.  Abdominal: Soft. She exhibits no distension.  Neurological: She is alert. She is disoriented (at baseline).  Skin: Skin is warm and dry.  Psychiatric: She has a normal mood and affect.     ED Treatments / Results  Labs (all labs ordered are listed, but only abnormal results are displayed) Labs Reviewed - No data to display  EKG  EKG Interpretation None       Radiology Ct Head Wo Contrast  Result Date: 04/16/2016 CLINICAL DATA:  82 year old female with history of fall with injury to the head today. EXAM: CT HEAD WITHOUT CONTRAST TECHNIQUE: Contiguous axial images were obtained from the base of the skull through the vertex without intravenous contrast. COMPARISON:  Head CT 04/04/2016. FINDINGS: Brain: Mild cerebral atrophy. Patchy and confluent areas of decreased attenuation are noted throughout the deep and periventricular white matter of the cerebral hemispheres bilaterally, compatible with chronic microvascular ischemic disease. Physiologic calcifications in the basal ganglia bilaterally. No evidence of acute infarction, hemorrhage, hydrocephalus, extra-axial collection or mass lesion/mass effect. Vascular: No hyperdense vessel or unexpected calcification. Skull: Normal. Negative for fracture or focal lesion. Sinuses/Orbits: No acute finding. Other: None. IMPRESSION: 1. No evidence of significant  acute traumatic injury to the skull or brain. 2. Mild cerebral atrophy. 3. Chronic microvascular ischemic changes in the cerebral white matter, as above. Electronically Signed   By: Trudie Reedaniel  Entrikin M.D.   On: 04/16/2016 13:03    Procedures Procedures (including critical care time)  Medications Ordered in ED Medications - No data to display   Initial Impression / Assessment and Plan / ED Course  I have reviewed the triage vital signs and the nursing notes.  Pertinent labs & imaging results that were available during my care of the patient were reviewed by me and considered in my medical decision making (see chart for details).  Clinical Course     82 y.o. female presents with unwitnessed fall being found on the ground. She is on eliquis for prior stroke. Old bruising over her left face and difficult exam 2/2 dementia but well appearing and no new signs of injury. Head CT negative for bleed. Plan to follow up with PCP as needed and return precautions discussed for worsening or new concerning symptoms.   Final Clinical Impressions(s) / ED Diagnoses   Final diagnoses:  Fall, initial encounter    New Prescriptions New Prescriptions   No medications on file     Lyndal Pulleyaniel Adell Koval, MD 04/16/16 1935

## 2016-04-16 NOTE — ED Notes (Signed)
Pt back from CT, pt back on monitor.

## 2016-04-16 NOTE — ED Notes (Signed)
Patient being transported to CT at this time 

## 2016-04-16 NOTE — ED Notes (Signed)
Vitals and pain score repeated to d/c patient out of computer

## 2016-04-19 ENCOUNTER — Encounter: Payer: Self-pay | Admitting: Nurse Practitioner

## 2016-04-19 ENCOUNTER — Ambulatory Visit (INDEPENDENT_AMBULATORY_CARE_PROVIDER_SITE_OTHER): Payer: Medicare Other | Admitting: Nurse Practitioner

## 2016-04-19 VITALS — BP 154/93 | HR 109

## 2016-04-19 DIAGNOSIS — I639 Cerebral infarction, unspecified: Secondary | ICD-10-CM

## 2016-04-19 DIAGNOSIS — I4891 Unspecified atrial fibrillation: Secondary | ICD-10-CM

## 2016-04-19 DIAGNOSIS — I480 Paroxysmal atrial fibrillation: Secondary | ICD-10-CM

## 2016-04-19 DIAGNOSIS — E785 Hyperlipidemia, unspecified: Secondary | ICD-10-CM

## 2016-04-19 DIAGNOSIS — I10 Essential (primary) hypertension: Secondary | ICD-10-CM

## 2016-04-19 NOTE — Progress Notes (Addendum)
[facility]  PATIENT: [name] Short DOB: [birth year]   REASON FOR VISIT:  follow-up for stroke, prior history of dementia HISTORY FROM: Patient and daughter  HISTORY OF PRESENT ILLNESS:UPDATE 12/07/2017CM [name] Short, [age] year old female returns for follow-up with her daughter. [facility]. She has a history of diabetes hypertension coronary artery disease hyperlipidemia and dementia and stroke involving the right middle cerebral peduncle and right. Ventricular white matter which occurred in May 2017. She has a history of atrial fibrillation. She has had multiple falls since last seen. Her most recent CT of the head was December 4 with no acute injury, but chronic microvascular ischemic changes in the cerebral white matter and cerebral atrophy. Appetite is reportedly good and she is sleeping well. She has not had further stroke or TIA symptoms she returns for reevaluation. Daughter is concerned with all the medications that she is on.  HISTORY 8/1/17CMMary Short is an 82 y.o. female with history of diabetes mellitus, hypertension, coronary artery disease, hyperlipidemia and dementia, brought to the emergency room for evaluation of dizziness which began on the afternoon of 10/01/2015 (LKW 3 PM on 10/01/2015). Patient experienced a fall at the nursing home where she resides. MRI of her brain showed punctate acute infarcts involving the right middle cerebellar peduncle and right periventricular white matter. Patient has been on aspirin and Plavix . No focal weakness has been noted. She's had no change in speech. NIH stroke score at the time of this evaluation was 2 for mental status changes. Patient was not administered IV t-PA secondary to being beyond time window. She was admitted for further evaluation and treatment.She has dementia and is NH resident. She can not remember details, but stated that she had dizziness for 15 min and now resolved. She  fell at NH due to dizziness. MRA  unremarkable.Carotid Doppler  unremarkable.2D Echo  EF 55-60%.LDL 101HgbA1c 8.2 She received outpatient therapies for 1 week at a skilled facility [facility] and then was transferred to her current facility which is a dementia unit. She has not had further stroke or TIA symptoms. She returns for reevaluation  REVIEW OF SYSTEMS: Full 14 system review of systems performed and notable only for those listed, all others are neg:  Constitutional: Fatigue  Cardiovascular: neg Ear/Nose/Throat: neg  Skin: neg Eyes: Blurred vision, light sensitivity Respiratory: neg Gastroitestinal: neg  Hematology/Lymphatic: neg  Endocrine: neg Musculoskeletal: Walking difficulty Allergy/Immunology: neg Neurological: History of memory loss Psychiatric: Depression and anxiety Sleep : neg   ALLERGIES: Allergies  Allergen Reactions  . Codeine Itching and Nausea And Vomiting    HOME MEDICATIONS: Outpatient Medications Prior to Visit  Medication Sig Dispense Refill  . acetaminophen (TYLENOL) 325 MG tablet Take 650 mg by mouth 4 (four) times daily.     Marland Kitchen. acetaminophen (TYLENOL) 500 MG tablet Take 500 mg by mouth every 4 (four) hours as needed for mild pain, moderate pain, fever or headache.    Marland Kitchen. alum & mag hydroxide-simeth (MINTOX) 200-200-20 MG/5ML suspension Take 30 mLs by mouth every 6 (six) hours as needed for indigestion or heartburn.    Marland Kitchen. apixaban (ELIQUIS) 2.5 MG TABS tablet Take 1 tablet (2.5 mg total) by mouth 2 (two) times daily. 60 tablet 0  . aspirin EC 81 MG tablet Take 81 mg by mouth daily.    . busPIRone (BUSPAR) 15 MG tablet Take 15 mg by mouth 2 (two) times daily.    . calcium-vitamin D (OSCAL WITH D)  500-200 MG-UNIT tablet Take 1 tablet by mouth 2 (two) times daily.    . cetirizine (ZYRTEC) 10 MG tablet Take 10 mg by mouth daily.    . famotidine (PEPCID) 20 MG tablet Take 20 mg by mouth daily.    . furosemide (LASIX) 20 MG tablet Take 20 mg by mouth daily.     Marland Kitchen HYDROcodone-acetaminophen (NORCO/VICODIN) 5-325 MG tablet Take 0.5 to 1 tablet by mouth every 6 (six) hours as needed for severe pain. 10 tablet 0  . levothyroxine (SYNTHROID, LEVOTHROID) 25 MCG tablet Take 37.5 mcg by mouth daily before breakfast.    . loperamide (IMODIUM) 2 MG capsule Take 2 mg by mouth as needed for diarrhea or loose stools.     Marland Kitchen losartan (COZAAR) 100 MG tablet Take 100 mg by mouth daily.    . magnesium hydroxide (MILK OF MAGNESIA) 400 MG/5ML suspension Take 30 mLs by mouth at bedtime as needed for mild constipation.     . metoprolol succinate (TOPROL-XL) 25 MG 24 hr tablet Take 25 mg by mouth at bedtime.    Marland Kitchen Neomycin-Bacitracin-Polymyxin (TRIPLE ANTIBIOTIC) [phone] OINT Apply 1 application topically as needed (for minor skin tears/abrasions).    . nitroGLYCERIN (NITROSTAT) 0.4 MG SL tablet Place 0.4 mg under the tongue every 5 (five) minutes as needed for chest pain.     Marland Kitchen omega-3 acid ethyl esters (LOVAZA) 1 G capsule Take 2 g by mouth daily.    Marland Kitchen PARoxetine (PAXIL) 30 MG tablet Take 30 mg by mouth daily.    . polyvinyl alcohol (LIQUIFILM TEARS) 1.4 % ophthalmic solution Place 1 drop into both eyes 4 (four) times daily.     . potassium chloride SA (K-DUR,KLOR-CON) 20 MEQ tablet Take 20 mEq by mouth daily.    . ranolazine (RANEXA) 500 MG 12 hr tablet Take 1 tablet (500 mg total) by mouth 2 (two) times daily. 60 tablet 5  . rivastigmine (EXELON) 4.6 mg/24hr Place 4.6 mg onto the skin daily.    Marland Kitchen senna (SENOKOT) 8.6 MG tablet Take 1 tablet by mouth at bedtime.    . simvastatin (ZOCOR) 10 MG tablet Take 10 mg by mouth at bedtime.     . traZODone (DESYREL) 50 MG tablet Take 75 mg by mouth at bedtime.    Marland Kitchen albuterol (PROVENTIL HFA;VENTOLIN HFA) 108 (90 BASE) MCG/ACT inhaler Inhale 1-2 puffs into the lungs every 6 (six) hours as needed for wheezing. (Patient not taking: Reported on 04/19/2016) 1 Inhaler 0  . diphenhydrAMINE-zinc acetate (BENADRYL) cream Apply 1 application  topically 2 (two) times daily.    Marland Kitchen guaifenesin (ROBITUSSIN) 100 MG/5ML syrup Take 200 mg by mouth every 6 (six) hours as needed for cough.     . Ipratropium-Albuterol (COMBIVENT RESPIMAT) 20-100 MCG/ACT AERS respimat Inhale 1 puff into the lungs every 6 (six) hours as needed for wheezing or shortness of breath.     . cetirizine (ZYRTEC) 10 MG tablet Take 10 mg by mouth daily.     No facility-administered medications prior to visit.     PAST MEDICAL HISTORY: Past Medical History:  Diagnosis Date  . Anginal pain (HCC)    not in in last month  . Anxiety   . Arthritis    "hands" (10/03/2015)  . Atrial fibrillation (HCC)   . CAD (coronary artery disease) 03/01/2008   Lexiscan - EF 79, no ECG changes/EKG negative for ischemia, post-stress EF 86  . Cerebral atherosclerosis 10/15/2011   Extracranial Examination - mild-moderate amount smooth mixed density plaque  elevating velocities within the proximal and mid segments of the internal carotid artery  . Chest pain 10/07/2009   2D Echo - EF 50-55, mitral valve calcified annulus  . CHF (congestive heart failure) (HCC)   . Chronic bronchitis (HCC)   . Claudication (HCC) 09/01/2010   LEA Doppler - Bilateral ABIs-demonstrated moderate arterial insufficiency to the lower extremities at rest, Right CIA/EIA-known occlusive disease, PV Cath  . Complication of anesthesia    "09/29/2015 put to sleep to take out wisdom tooth; took >12h to get her awake" (10/03/2015  . Coronary artery disease   . Depression   . Dyspnea 06/01/2012   2D Echo - EF 60-65, mitral valve calcified annulus, left atrium severely dilated, right atrium moderately dilated  . GERD (gastroesophageal reflux disease)    takes prevacid  . High cholesterol   . Hypertension   . Hypothyroidism   . Memory loss   . Myocardial infarction   . PVD (peripheral vascular disease) (HCC)   . Stroke [facility]) ?10/02/2015  . Thyroid disease   . Type II diabetes mellitus (HCC)   . Vascular dementia      PAST SURGICAL HISTORY: Past Surgical History:  Procedure Laterality Date  . ABDOMINAL HYSTERECTOMY    . ANGIOPLASTY Left 02/23/2010   Darcon patch, 80-90% stenosis proximal to the anastomosis  . AORTA - BILATERAL FEMORAL ARTERY BYPASS GRAFT  2011  . AXILLARY-FEMORAL BYPASS GRAFT Right 10/08/2012   Procedure: BYPASS GRAFT AXILLA-BIFEMORAL;  Surgeon: Pryor Ochoa, MD;  [facility];  Service: Vascular;  Laterality: Right;  . CARDIAC CATHETERIZATION  2012  . CARDIAC CATHETERIZATION  06/02/2012   Unchanged anatomy compared to 3 years ago, continue medical therapy  . CARDIAC CATHETERIZATION  10/06/2009   Occluded right coronary artery, left-to-right collateral noncritical left disease with occluded grafts  . CATARACT EXTRACTION W/ INTRAOCULAR LENS  IMPLANT, BILATERAL Bilateral   . CORONARY ANGIOPLASTY    . CORONARY ARTERY BYPASS GRAFT  1993  . EYE SURGERY    . JOINT REPLACEMENT    . LEFT HEART CATHETERIZATION WITH CORONARY/GRAFT ANGIOGRAM N/A 06/02/2012   Procedure: LEFT HEART CATHETERIZATION WITH Isabel Caprice;  Surgeon: Runell Gess, MD;  [facility] C[facility] LAB;  Service: Cardiovascular;  Laterality: N/A;  . TOTAL KNEE ARTHROPLASTY Left 2010    FAMILY HISTORY: Family History  Problem Relation Age of Onset  . Heart disease Father   . Hypertension Father   . Heart attack Father   . Kidney failure Brother   . Hyperlipidemia Daughter     SOCIAL HISTORY: Social History   Social History  . Marital status: Widowed    Spouse name: N/A  . Number of children: N/A  . Years of education: N/A   Occupational History  . Not on file.   Social History Main Topics  . Smoking status: Former Smoker    Packs/day: 1.00    Years: 50.00    Types: Cigarettes    Quit date: 05/15/1991  . Smokeless tobacco: Never Used  . Alcohol use No  . Drug use: No  . Sexual activity: No   Other Topics Concern  . Not on file   Social History Narrative  . No narrative on file      PHYSICAL EXAM  Vitals:   04/19/16 1415  BP: (!) 154/93  Pulse: (!) 109   There is no height or weight on file to calculate BMI.  Generalized: Well developed, in no acute distress  Head: normocephalic and atraumatic,. Oropharynx benign  Neck: Supple, no carotid bruits  Cardiac: Irregularly irregular  rate rhythm,   Musculoskeletal: No deformity  Skin bruising on the left cheek and chin from previous fall  Neurological examination   Mentation: Alert not oriented to time, or place. Language including expression and naming were found to be intact fund of knowledge was impaired. Follows commands  Cranial nerve II-XII: Pupils were equal round reactive to light extraocular movements were full, visual field were full on confrontational test. Facial sensation and strength were normal. hearing was intact to finger rubbing bilaterally. Uvula tongue midline. head turning and shoulder shrug were normal and symmetric.Tongue protrusion into cheek strength was normal. Motor: normal bulk and tone, full strength in the BUE, BLE, fine finger movements normal, no pronator drift. No focal weakness Sensory: normal and symmetric to light touch, pinprick, and  Vibration in the upper and lower extremities, Coordination: finger-nose-finger, heel-to-shin bilaterally, no dysmetria. No tremor Reflexes: 1+ upper lower and symmetric plantar responses were flexor bilaterally. Gait and Station: Not ambulated in wheelchair  DIAGNOSTIC DATA (LABS, IMAGING, TESTING) - I reviewed patient records, labs, notes, testing and imaging myself where available.  Lab Results  Component Value Date   WBC 10.5 04/04/2016   HGB 12.2 04/04/2016   HCT 38.3 04/04/2016   MCV 86.8 04/04/2016   PLT 202 04/04/2016      Component Value Date/Time   NA 137 04/04/2016 0848   K 3.4 (L) 04/04/2016 0848   CL 102 04/04/2016 0848   CO2 27 04/04/2016 0848   GLUCOSE 118 (H) 04/04/2016 0848   BUN 7 04/04/2016 0848   CREATININE 0.97  04/04/2016 0848   CALCIUM 9.0 04/04/2016 0848   PROT 5.8 (L) 04/04/2016 0848   ALBUMIN 3.3 (L) 04/04/2016 0848   AST 23 04/04/2016 0848   ALT 11 (L) 04/04/2016 0848   ALKPHOS 90 04/04/2016 0848   BILITOT 1.0 04/04/2016 0848   GFRNONAA 51 (L) 04/04/2016 0848   GFRAA 60 (L) 04/04/2016 0848   Lab Results  Component Value Date   CHOL 149 10/03/2015   HDL 26 (L) 10/03/2015   LDLCALC 101 (H) 10/03/2015   TRIG 111 10/03/2015   CHOLHDL 5.7 10/03/2015   Lab Results  Component Value Date   HGBA1C 8.2 (H) 10/03/2015   No results found for: [id number] Lab Results  Component Value Date   TSH 1.239 10/03/2015      ASSESSMENT AND PLAN  82 y.o. year old female  has a past medical history of ; Stroke (HCC) (10/02/2015);  and Vascular dementia. here to follow-up. The patient is a current patient of Dr. Roda Shutters who is out of the office today . This note is sent to the work in doctor.     PLAN: Continue eliquis for secondary stroke prevention and atrial fibrillation Continue aspirin .81mg  along with Eliquis Maintain strict control of hypertension with blood pressure goal below 130/90, continue antihypertensive medications Control of diabetes with hemoglobin A1c below 6.5 followed by primary careCholesterol with LDL cholesterol less than 70, followed by primary care,   continue statin drugs Eat healthy diet with whole grains,  fresh fruits and vegetables Please place alarms on wheelchair and bed at high risk for continued falls  F/U in 6 months Nilda Riggs, [address] [facility], [facility], APRN  [facility] [address], [address] [place], [place] [address] [phone]  I reviewed the above note and documentation by the Nurse Practitioner and agree with the history, physical exam, assessment and plan as outlined above. I  was immediately available for face-to-face consultation. Huston FoleySaima Athar, MD, PhD [facility]

## 2016-04-19 NOTE — Patient Instructions (Addendum)
Continue eliquis for secondary stroke prevention and atrial fibrillation Continue aspirin .81mg  along with Eliquis Maintain strict control of hypertension with blood pressure goal below 130/90, continue antihypertensive medications Control of diabetes with hemoglobin A1c below 6.5 followed by primary careCholesterol with LDL cholesterol less than 70, followed by primary care,   continue statin drugs Eat healthy diet with whole grains,  fresh fruits and vegetables Please place alarms on wheelchair and bed at high risk for continued falls  F/U in 6 months

## 2016-04-25 ENCOUNTER — Emergency Department (HOSPITAL_COMMUNITY)
Admission: EM | Admit: 2016-04-25 | Discharge: 2016-04-26 | Disposition: A | Discharge status: Home / Self Care | Payer: Medicare Other | Attending: Emergency Medicine | Admitting: Emergency Medicine

## 2016-04-25 ENCOUNTER — Emergency Department (HOSPITAL_COMMUNITY): Payer: Medicare Other

## 2016-04-25 ENCOUNTER — Encounter (HOSPITAL_COMMUNITY): Payer: Self-pay | Admitting: Emergency Medicine

## 2016-04-25 DIAGNOSIS — Z7982 Long term (current) use of aspirin: Secondary | ICD-10-CM | POA: Insufficient documentation

## 2016-04-25 DIAGNOSIS — W050XXA Fall from non-moving wheelchair, initial encounter: Secondary | ICD-10-CM | POA: Insufficient documentation

## 2016-04-25 DIAGNOSIS — Z7901 Long term (current) use of anticoagulants: Secondary | ICD-10-CM | POA: Diagnosis not present

## 2016-04-25 DIAGNOSIS — I2510 Atherosclerotic heart disease of native coronary artery without angina pectoris: Secondary | ICD-10-CM | POA: Diagnosis not present

## 2016-04-25 DIAGNOSIS — I110 Hypertensive heart disease with heart failure: Secondary | ICD-10-CM | POA: Diagnosis not present

## 2016-04-25 DIAGNOSIS — Y999 Unspecified external cause status: Secondary | ICD-10-CM | POA: Diagnosis not present

## 2016-04-25 DIAGNOSIS — M25551 Pain in right hip: Secondary | ICD-10-CM | POA: Diagnosis not present

## 2016-04-25 DIAGNOSIS — E039 Hypothyroidism, unspecified: Secondary | ICD-10-CM | POA: Diagnosis not present

## 2016-04-25 DIAGNOSIS — I509 Heart failure, unspecified: Secondary | ICD-10-CM | POA: Diagnosis not present

## 2016-04-25 DIAGNOSIS — Z955 Presence of coronary angioplasty implant and graft: Secondary | ICD-10-CM | POA: Insufficient documentation

## 2016-04-25 DIAGNOSIS — Z87891 Personal history of nicotine dependence: Secondary | ICD-10-CM | POA: Insufficient documentation

## 2016-04-25 DIAGNOSIS — Z96652 Presence of left artificial knee joint: Secondary | ICD-10-CM | POA: Diagnosis not present

## 2016-04-25 DIAGNOSIS — Z951 Presence of aortocoronary bypass graft: Secondary | ICD-10-CM | POA: Insufficient documentation

## 2016-04-25 DIAGNOSIS — Y92009 Unspecified place in unspecified non-institutional (private) residence as the place of occurrence of the external cause: Secondary | ICD-10-CM

## 2016-04-25 DIAGNOSIS — Z79899 Other long term (current) drug therapy: Secondary | ICD-10-CM | POA: Diagnosis not present

## 2016-04-25 DIAGNOSIS — S0990XA Unspecified injury of head, initial encounter: Secondary | ICD-10-CM | POA: Diagnosis present

## 2016-04-25 DIAGNOSIS — Y939 Activity, unspecified: Secondary | ICD-10-CM | POA: Diagnosis not present

## 2016-04-25 DIAGNOSIS — S0003XA Contusion of scalp, initial encounter: Secondary | ICD-10-CM | POA: Diagnosis not present

## 2016-04-25 DIAGNOSIS — E119 Type 2 diabetes mellitus without complications: Secondary | ICD-10-CM | POA: Insufficient documentation

## 2016-04-25 DIAGNOSIS — W19XXXA Unspecified fall, initial encounter: Secondary | ICD-10-CM

## 2016-04-25 DIAGNOSIS — Z8673 Personal history of transient ischemic attack (TIA), and cerebral infarction without residual deficits: Secondary | ICD-10-CM | POA: Diagnosis not present

## 2016-04-25 MED ORDER — ACETAMINOPHEN 500 MG PO TABS
1000.0000 mg | ORAL_TABLET | Freq: Once | ORAL | Status: AC
  Administered 2016-04-25: 1000 mg via ORAL
  Filled 2016-04-25: qty 2

## 2016-04-25 NOTE — ED Triage Notes (Signed)
[facility]  Fall from wheelchair; pt forgot she cannot stand.  Hit back of head.  No LOC.  At baseline per EMS and facility.  Pt on Eliquis for A-fib.  C/o L hip pain,full ROM.  140/70 blood pressure, 74 HR on truck.

## 2016-04-25 NOTE — ED Notes (Signed)
Patient transported to CT 

## 2016-04-25 NOTE — ED Notes (Signed)
Patient transported to X-ray 

## 2016-04-25 NOTE — ED Provider Notes (Signed)
[facility]-EMERGENCY DEPT Provider Note   CSN: [id number] Arrival date & time: 04/25/16  1956     History   Chief Complaint Chief Complaint  Patient presents with  . Fall    HPI [name] Short is a 82 y.o. female.   Fall  This is a new problem. The current episode started 1 to 2 hours ago. The problem occurs rarely. Pertinent negatives include no chest pain, no abdominal pain, no headaches and no shortness of breath. Associated symptoms comments: Right hip pain. Nothing aggravates the symptoms. Nothing relieves the symptoms. She has tried nothing for the symptoms. The treatment provided no relief.    Past Medical History:  Diagnosis Date  . Anginal pain (HCC)    not in in last month  . Anxiety   . Arthritis    "hands" (10/03/2015)  . Atrial fibrillation (HCC)   . CAD (coronary artery disease) 03/01/2008   Lexiscan - EF 79, no ECG changes/EKG negative for ischemia, post-stress EF 86  . Cerebral atherosclerosis 10/15/2011   Extracranial Examination - mild-moderate amount smooth mixed density plaque elevating velocities within the proximal and mid segments of the internal carotid artery  . Chest pain 10/07/2009   2D Echo - EF 50-55, mitral valve calcified annulus  . CHF (congestive heart failure) (HCC)   . Chronic bronchitis (HCC)   . Claudication (HCC) 09/01/2010   LEA Doppler - Bilateral ABIs-demonstrated moderate arterial insufficiency to the lower extremities at rest, Right CIA/EIA-known occlusive disease, PV Cath  . Complication of anesthesia    "09/29/2015 put to sleep to take out wisdom tooth; took >12h to get her awake" (10/03/2015  . Coronary artery disease   . Depression   . Dyspnea 06/01/2012   2D Echo - EF 60-65, mitral valve calcified annulus, left atrium severely dilated, right atrium moderately dilated  . GERD (gastroesophageal reflux disease)    takes prevacid  . High cholesterol   . Hypertension   . Hypothyroidism   . Memory loss   . Myocardial infarction   . PVD  (peripheral vascular disease) (HCC)   . Stroke [facility](HCC) ?10/02/2015  . Thyroid disease   . Type II diabetes mellitus (HCC)   . Vascular dementia     Patient Active Problem List   Diagnosis Date Noted  . Acute CVA (cerebrovascular accident) (HCC) 10/03/2015  . Cerebellar stroke (HCC)   . Paroxysmal atrial fibrillation (HCC)   . HLD (hyperlipidemia)   . Stroke (HCC) 10/02/2015  . Acute embolic stroke (HCC) 10/02/2015  . Stroke (cerebrum) (HCC) 10/02/2015  . Acute respiratory failure with hypoxia (HCC) 10/02/2015  . Essential hypertension 03/12/2013  . Atherosclerosis of native arteries of the extremities with intermittent claudication 11/04/2012  . Preoperative clearance 09/25/2012  . CAD (coronary artery disease) of artery bypass graft 09/25/2012  . Peripheral vascular disease, unspecified 09/23/2012  . Aftercare following surgery of the circulatory system, NEC 09/23/2012  . Chest pain 08/11/2012  . Chronic atrial fibrillation- ventricular rate is controlled 08/11/2012  . Dyslipidemia 06/03/2012  . PVD (peripheral vascular disease), AOBF '94 06/03/2012  . NSVT, EF 60-65% by 2D this admission, beta blocker increased 06/03/2012  . CAD, CABG X 2 '93- Last cath 05/2012- no change from 2011, medical Rx- EF of 60-65% via echo 05/2012 06/03/2012  . CHF (congestive heart failure) - mild, BNP 3K 05/31/2012  . Unstable angina (HCC) 05/31/2012  . Diabetes mellitus type II, controlled (HCC) 05/31/2012  . Atrial fibrillation with controlled ventricular response (HCC) 05/31/2012    Past  Surgical History:  Procedure Laterality Date  . ABDOMINAL HYSTERECTOMY    . ANGIOPLASTY Left 02/23/2010   Darcon patch, 80-90% stenosis proximal to the anastomosis  . AORTA - BILATERAL FEMORAL ARTERY BYPASS GRAFT  2011  . AXILLARY-FEMORAL BYPASS GRAFT Right 10/08/2012   Procedure: BYPASS GRAFT AXILLA-BIFEMORAL;  Surgeon: Pryor Ochoa, MD;  Location: [facility] OR;  Service: Vascular;  Laterality: Right;  . CARDIAC  CATHETERIZATION  2012  . CARDIAC CATHETERIZATION  06/02/2012   Unchanged anatomy compared to 3 years ago, continue medical therapy  . CARDIAC CATHETERIZATION  10/06/2009   Occluded right coronary artery, left-to-right collateral noncritical left disease with occluded grafts  . CATARACT EXTRACTION W/ INTRAOCULAR LENS  IMPLANT, BILATERAL Bilateral   . CORONARY ANGIOPLASTY    . CORONARY ARTERY BYPASS GRAFT  1993  . EYE SURGERY    . JOINT REPLACEMENT    . LEFT HEART CATHETERIZATION WITH CORONARY/GRAFT ANGIOGRAM N/A 06/02/2012   Procedure: LEFT HEART CATHETERIZATION WITH Isabel Caprice;  Surgeon: Runell Gess, MD;  Location: [facility] CATH LAB;  Service: Cardiovascular;  Laterality: N/A;  . TOTAL KNEE ARTHROPLASTY Left 2010    OB History    No data available       Home Medications    Prior to Admission medications   Medication Sig Start Date End Date Taking? Authorizing Provider  acetaminophen (TYLENOL) 325 MG tablet Take 650 mg by mouth 4 (four) times daily.     Historical Provider, MD  acetaminophen (TYLENOL) 500 MG tablet Take 500 mg by mouth every 4 (four) hours as needed for mild pain, moderate pain, fever or headache.    Historical Provider, MD  albuterol (PROVENTIL HFA;VENTOLIN HFA) 108 (90 BASE) MCG/ACT inhaler Inhale 1-2 puffs into the lungs every 6 (six) hours as needed for wheezing. Patient not taking: Reported on 04/19/2016 12/31/12   Marlon Pel, PA-C  alum & mag hydroxide-simeth (MINTOX) 200-200-20 MG/5ML suspension Take 30 mLs by mouth every 6 (six) hours as needed for indigestion or heartburn.    Historical Provider, MD  apixaban (ELIQUIS) 2.5 MG TABS tablet Take 1 tablet (2.5 mg total) by mouth 2 (two) times daily. 10/05/15   Rhetta Mura, MD  aspirin EC 81 MG tablet Take 81 mg by mouth daily.    Historical Provider, MD  busPIRone (BUSPAR) 15 MG tablet Take 15 mg by mouth 2 (two) times daily.    Historical Provider, MD  calcium-vitamin D (OSCAL WITH D) 500-200  MG-UNIT tablet Take 1 tablet by mouth 2 (two) times daily.    Historical Provider, MD  cetirizine (ZYRTEC) 10 MG tablet Take 10 mg by mouth daily.    Historical Provider, MD  diphenhydrAMINE-zinc acetate (BENADRYL) cream Apply 1 application topically 2 (two) times daily.    Historical Provider, MD  famotidine (PEPCID) 20 MG tablet Take 20 mg by mouth daily.    Historical Provider, MD  furosemide (LASIX) 20 MG tablet Take 20 mg by mouth daily.    Historical Provider, MD  guaifenesin (ROBITUSSIN) 100 MG/5ML syrup Take 200 mg by mouth every 6 (six) hours as needed for cough.     Historical Provider, MD  HYDROcodone-acetaminophen (NORCO/VICODIN) 5-325 MG tablet Take 0.5 to 1 tablet by mouth every 6 (six) hours as needed for severe pain. 03/20/16   Jaime Pilcher Ward, PA-C  Ipratropium-Albuterol (COMBIVENT RESPIMAT) 20-100 MCG/ACT AERS respimat Inhale 1 puff into the lungs every 6 (six) hours as needed for wheezing or shortness of breath.     Historical Provider, MD  levothyroxine (SYNTHROID, LEVOTHROID) 25 MCG tablet Take 37.5 mcg by mouth daily before breakfast.    Historical Provider, MD  loperamide (IMODIUM) 2 MG capsule Take 2 mg by mouth as needed for diarrhea or loose stools.     Historical Provider, MD  losartan (COZAAR) 100 MG tablet Take 100 mg by mouth daily.    Historical Provider, MD  magnesium hydroxide (MILK OF MAGNESIA) 400 MG/5ML suspension Take 30 mLs by mouth at bedtime as needed for mild constipation.     Historical Provider, MD  metoprolol succinate (TOPROL-XL) 25 MG 24 hr tablet Take 25 mg by mouth at bedtime.    Historical Provider, MD  Neomycin-Bacitracin-Polymyxin (TRIPLE ANTIBIOTIC) [phone] OINT Apply 1 application topically as needed (for minor skin tears/abrasions).    Historical Provider, MD  nitroGLYCERIN (NITROSTAT) 0.4 MG SL tablet Place 0.4 mg under the tongue every 5 (five) minutes as needed for chest pain.     Historical Provider, MD  omega-3 acid ethyl esters  (LOVAZA) 1 G capsule Take 2 g by mouth daily.    Historical Provider, MD  PARoxetine (PAXIL) 30 MG tablet Take 30 mg by mouth daily.    Historical Provider, MD  polyvinyl alcohol (LIQUIFILM TEARS) 1.4 % ophthalmic solution Place 1 drop into both eyes 4 (four) times daily.     Historical Provider, MD  potassium chloride SA (K-DUR,KLOR-CON) 20 MEQ tablet Take 20 mEq by mouth daily.    Historical Provider, MD  ranolazine (RANEXA) 500 MG 12 hr tablet Take 1 tablet (500 mg total) by mouth 2 (two) times daily. 08/12/12   Brittainy Sherlynn Carbon, PA-C  rivastigmine (EXELON) 4.6 mg/24hr Place 4.6 mg onto the skin daily.    Historical Provider, MD  senna (SENOKOT) 8.6 MG tablet Take 1 tablet by mouth at bedtime.    Historical Provider, MD  simvastatin (ZOCOR) 10 MG tablet Take 10 mg by mouth at bedtime.     Historical Provider, MD  traZODone (DESYREL) 50 MG tablet Take 75 mg by mouth at bedtime.    Historical Provider, MD    Family History Family History  Problem Relation Age of Onset  . Heart disease Father   . Hypertension Father   . Heart attack Father   . Kidney failure Brother   . Hyperlipidemia Daughter     Social History Social History  Substance Use Topics  . Smoking status: Former Smoker    Packs/day: 1.00    Years: 50.00    Types: Cigarettes    Quit date: 05/15/1991  . Smokeless tobacco: Never Used  . Alcohol use No     Allergies   Codeine   Review of Systems Review of Systems  Constitutional: Negative for chills and fever.  HENT: Negative for ear pain and sore throat.   Eyes: Negative for pain and visual disturbance.  Respiratory: Negative for cough and shortness of breath.   Cardiovascular: Negative for chest pain and palpitations.  Gastrointestinal: Negative for abdominal pain and vomiting.  Genitourinary: Negative for dysuria and hematuria.  Musculoskeletal: Positive for arthralgias (right hip). Negative for back pain.  Skin: Negative for color change and rash.    Neurological: Negative for seizures, syncope and headaches.  Hematological: Bruises/bleeds easily.  All other systems reviewed and are negative.    Physical Exam Updated Vital Signs BP 121/78   Pulse (!) 132   Temp 97.8 F (36.6 C) (Oral)   Resp 17   SpO2 92%   Physical Exam  Constitutional: She appears well-developed and well-nourished.  No distress.  HENT:  Head: Normocephalic. Head is with contusion. Head is without raccoon's eyes and without laceration.    Right Ear: Tympanic membrane normal.  Left Ear: Tympanic membrane normal.  Nose: No septal deviation or nasal septal hematoma.  Mouth/Throat: Uvula is midline, oropharynx is clear and moist and mucous membranes are normal.  Eyes: Conjunctivae are normal. Pupils are equal, round, and reactive to light.  Neck: Neck supple.  Cardiovascular: Normal rate and regular rhythm.   No murmur heard. Pulmonary/Chest: Effort normal and breath sounds normal. No respiratory distress. She has no decreased breath sounds. She exhibits no tenderness.  Abdominal: Soft. Bowel sounds are normal. There is no tenderness.  Musculoskeletal: She exhibits no edema.       Right hip: She exhibits tenderness and bony tenderness.       Cervical back: She exhibits normal range of motion, no tenderness and no bony tenderness.       Thoracic back: She exhibits normal range of motion, no tenderness and no bony tenderness.       Lumbar back: She exhibits normal range of motion, no tenderness and no bony tenderness.  Neurological: She is alert. She has normal strength. She is disoriented. No cranial nerve deficit or sensory deficit. GCS eye subscore is 4. GCS verbal subscore is 4. GCS motor subscore is 6.  Patient is not ambulatory at baseline, does not appear discoordinated  Skin: Skin is warm and dry.  Scattered bruising throughout all extremities  Psychiatric: She has a normal mood and affect.  Nursing note and vitals reviewed.    ED Treatments /  Results  Labs (all labs ordered are listed, but only abnormal results are displayed) Labs Reviewed - No data to display  EKG  EKG Interpretation None       Radiology Ct Head Wo Contrast  Result Date: 04/25/2016 CLINICAL DATA:  82 y/o F; fell from wheelchair with injury to back of head and pain. EXAM: CT HEAD WITHOUT CONTRAST TECHNIQUE: Contiguous axial images were obtained from the base of the skull through the vertex without intravenous contrast. COMPARISON:  04/16/2016 CT of the head. FINDINGS: Brain: No evidence of acute infarction, hemorrhage, hydrocephalus, extra-axial collection or mass lesion/mass effect. Patchy foci of hypoattenuation within subcortical and periventricular white matter are compatible with moderate chronic microvascular ischemic changes. Lucency in the right became in is unchanged consistent with chronic lacunar infarct. Mild brain parenchymal volume loss. Vascular: Extensive calcific atherosclerosis of the cavernous internal carotid arteries. Skull: Right parietal scalp small contusion. No displaced calvarial fracture. Sinuses/Orbits: No acute finding. Underpneumatized frontal sinuses. Bilateral intra-ocular lens replacement. Other: None. IMPRESSION: 1. Right parietal small scalp contusion. No displaced calvarial fracture. 2. No acute intracranial abnormality is identified. 3. Stable brain parenchymal volume loss and chronic microvascular ischemic changes. Electronically Signed   By: Mitzi HansenLance  Furusawa-Stratton M.D.   On: 04/25/2016 23:03   Dg Hip Unilat W Or Wo Pelvis 2-3 Views Right  Result Date: 04/25/2016 CLINICAL DATA:  Fall with pain EXAM: DG HIP (WITH OR WITHOUT PELVIS) 2-3V RIGHT COMPARISON:  None. FINDINGS: Surgical clips over the bilateral groins. No acute fracture or dislocation. Pubic symphysis appears intact. SI joints are symmetric. Vascular calcifications in the pelvis and right groin. IMPRESSION: No radiographic evidence for acute osseous abnormality  Electronically Signed   By: Jasmine PangKim  Fujinaga M.D.   On: 04/25/2016 22:32    Procedures Procedures (including critical care time)  Medications Ordered in ED Medications  acetaminophen (TYLENOL) tablet 1,000 mg (1,000  mg Oral Given 04/25/16 2302)     Initial Impression / Assessment and Plan / ED Course  I have reviewed the triage vital signs and the nursing notes.  Pertinent labs & imaging results that were available during my care of the patient were reviewed by me and considered in my medical decision making (see chart for details).  Clinical Course    82 year old female chronic issues with falling reports today after another fall. She has been seen multiple times a last few months for falls. She does not walk at baseline but has dementia and forgets she cannot walk attempts to get out of wheelchair falls. Today she fell, landing on her buttock and hit the back of her head. This small contusion she is on Eliquis because of her A. fib. He has pain at the right hip. We'll get CT head noncontrast as well as a right hip film.  Hip x-ray and CT of her head without any acute findings. They do comment on the scalp hematoma that we see on exam. She is at her baseline for neurologic function safe for discharge home. Vital signs stable time of discharge. Strict return precautions are provided.  Final Clinical Impressions(s) / ED Diagnoses   Final diagnoses:  Fall, initial encounter  Fall in home, initial encounter  Contusion of scalp, initial encounter    New Prescriptions New Prescriptions   No medications on file     Cherlynn Perches, MD 04/25/16 2322    Derwood Kaplan, MD 04/28/16 [phone]

## 2016-04-27 ENCOUNTER — Emergency Department (HOSPITAL_COMMUNITY): Payer: Medicare Other

## 2016-04-27 ENCOUNTER — Emergency Department (HOSPITAL_COMMUNITY)
Admission: EM | Admit: 2016-04-27 | Discharge: 2016-04-27 | Disposition: A | Discharge status: Home / Self Care | Payer: Medicare Other | Attending: Emergency Medicine | Admitting: Emergency Medicine

## 2016-04-27 ENCOUNTER — Encounter (HOSPITAL_COMMUNITY): Payer: Self-pay | Admitting: Emergency Medicine

## 2016-04-27 DIAGNOSIS — E119 Type 2 diabetes mellitus without complications: Secondary | ICD-10-CM | POA: Diagnosis not present

## 2016-04-27 DIAGNOSIS — E039 Hypothyroidism, unspecified: Secondary | ICD-10-CM | POA: Diagnosis not present

## 2016-04-27 DIAGNOSIS — S0990XA Unspecified injury of head, initial encounter: Secondary | ICD-10-CM

## 2016-04-27 DIAGNOSIS — Z7982 Long term (current) use of aspirin: Secondary | ICD-10-CM | POA: Insufficient documentation

## 2016-04-27 DIAGNOSIS — I110 Hypertensive heart disease with heart failure: Secondary | ICD-10-CM | POA: Insufficient documentation

## 2016-04-27 DIAGNOSIS — Y939 Activity, unspecified: Secondary | ICD-10-CM | POA: Insufficient documentation

## 2016-04-27 DIAGNOSIS — Z87891 Personal history of nicotine dependence: Secondary | ICD-10-CM | POA: Diagnosis not present

## 2016-04-27 DIAGNOSIS — W19XXXA Unspecified fall, initial encounter: Secondary | ICD-10-CM

## 2016-04-27 DIAGNOSIS — I252 Old myocardial infarction: Secondary | ICD-10-CM | POA: Diagnosis not present

## 2016-04-27 DIAGNOSIS — W06XXXA Fall from bed, initial encounter: Secondary | ICD-10-CM | POA: Diagnosis not present

## 2016-04-27 DIAGNOSIS — Y999 Unspecified external cause status: Secondary | ICD-10-CM | POA: Diagnosis not present

## 2016-04-27 DIAGNOSIS — Z8673 Personal history of transient ischemic attack (TIA), and cerebral infarction without residual deficits: Secondary | ICD-10-CM | POA: Insufficient documentation

## 2016-04-27 DIAGNOSIS — I509 Heart failure, unspecified: Secondary | ICD-10-CM | POA: Insufficient documentation

## 2016-04-27 DIAGNOSIS — S52124A Nondisplaced fracture of head of right radius, initial encounter for closed fracture: Secondary | ICD-10-CM | POA: Insufficient documentation

## 2016-04-27 DIAGNOSIS — Y9289 Other specified places as the place of occurrence of the external cause: Secondary | ICD-10-CM | POA: Insufficient documentation

## 2016-04-27 DIAGNOSIS — Z79899 Other long term (current) drug therapy: Secondary | ICD-10-CM | POA: Insufficient documentation

## 2016-04-27 DIAGNOSIS — I2510 Atherosclerotic heart disease of native coronary artery without angina pectoris: Secondary | ICD-10-CM | POA: Diagnosis not present

## 2016-04-27 DIAGNOSIS — Z7901 Long term (current) use of anticoagulants: Secondary | ICD-10-CM | POA: Insufficient documentation

## 2016-04-27 DIAGNOSIS — S6991XA Unspecified injury of right wrist, hand and finger(s), initial encounter: Secondary | ICD-10-CM | POA: Diagnosis present

## 2016-04-27 LAB — COMPREHENSIVE METABOLIC PANEL
ALBUMIN: 3.5 g/dL (ref 3.5–5.0)
ALK PHOS: 113 U/L (ref 38–126)
ALT: 15 U/L (ref 14–54)
AST: 27 U/L (ref 15–41)
Anion gap: 10 (ref 5–15)
BILIRUBIN TOTAL: 2 mg/dL — AB (ref 0.3–1.2)
BUN: 20 mg/dL (ref 6–20)
CALCIUM: 9 mg/dL (ref 8.9–10.3)
CO2: 26 mmol/L (ref 22–32)
CREATININE: 1.08 mg/dL — AB (ref 0.44–1.00)
Chloride: 100 mmol/L — ABNORMAL LOW (ref 101–111)
GFR calc Af Amer: 52 mL/min — ABNORMAL LOW (ref >60)
GFR calc non Af Amer: 45 mL/min — ABNORMAL LOW (ref >60)
GLUCOSE: 131 mg/dL — AB (ref 65–99)
Potassium: 4 mmol/L (ref 3.5–5.1)
SODIUM: 136 mmol/L (ref 135–145)
TOTAL PROTEIN: 6.4 g/dL — AB (ref 6.5–8.1)

## 2016-04-27 LAB — CBC WITH DIFFERENTIAL/PLATELET
BASOS ABS: 0 10*3/uL (ref 0.0–0.1)
BASOS PCT: 0 %
Eosinophils Absolute: 0.1 10*3/uL (ref 0.0–0.7)
Eosinophils Relative: 1 %
HEMATOCRIT: 37.2 % (ref 36.0–46.0)
HEMOGLOBIN: 12 g/dL (ref 12.0–15.0)
Lymphocytes Relative: 9 %
Lymphs Abs: 0.9 10*3/uL (ref 0.7–4.0)
MCH: 27.6 pg (ref 26.0–34.0)
MCHC: 32.3 g/dL (ref 30.0–36.0)
MCV: 85.5 fL (ref 78.0–100.0)
MONOS PCT: 12 %
Monocytes Absolute: 1.3 10*3/uL — ABNORMAL HIGH (ref 0.1–1.0)
NEUTROS ABS: 8.5 10*3/uL — AB (ref 1.7–7.7)
NEUTROS PCT: 78 %
Platelets: 229 10*3/uL (ref 150–400)
RBC: 4.35 MIL/uL (ref 3.87–5.11)
RDW: 16.5 % — ABNORMAL HIGH (ref 11.5–15.5)
WBC: 10.9 10*3/uL — ABNORMAL HIGH (ref 4.0–10.5)

## 2016-04-27 LAB — URINALYSIS, ROUTINE W REFLEX MICROSCOPIC
BILIRUBIN URINE: NEGATIVE
Glucose, UA: NEGATIVE mg/dL
HGB URINE DIPSTICK: NEGATIVE
Ketones, ur: NEGATIVE mg/dL
Nitrite: NEGATIVE
PROTEIN: 30 mg/dL — AB
SPECIFIC GRAVITY, URINE: 1.019 (ref 1.005–1.030)
SQUAMOUS EPITHELIAL / LPF: NONE SEEN
pH: 5 (ref 5.0–8.0)

## 2016-04-27 MED ORDER — CEPHALEXIN 500 MG PO CAPS
500.0000 mg | ORAL_CAPSULE | Freq: Once | ORAL | Status: AC
  Administered 2016-04-27: 500 mg via ORAL
  Filled 2016-04-27: qty 1

## 2016-04-27 MED ORDER — MORPHINE SULFATE (PF) 2 MG/ML IV SOLN
2.0000 mg | Freq: Once | INTRAVENOUS | Status: AC
  Administered 2016-04-27: 2 mg via INTRAVENOUS
  Filled 2016-04-27: qty 1

## 2016-04-27 MED ORDER — BACITRACIN ZINC 500 UNIT/GM EX OINT
TOPICAL_OINTMENT | Freq: Every day | CUTANEOUS | Status: AC
  Administered 2016-04-27: 1 via TOPICAL

## 2016-04-27 NOTE — ED Notes (Signed)
Pt with multiple skin tears on upper extremities and lower extremities. Upper extremities with scant bright red blood. L thumb with wound at tip of thumb and surrounding nail bed. No drainage noted.

## 2016-04-27 NOTE — ED Provider Notes (Signed)
[facility]-EMERGENCY DEPT Provider Note   CSN: [id number] Arrival date & time: 04/27/16  0827     History   Chief Complaint Chief Complaint  Patient presents with  . Fall  . Head Laceration  . skin tear    bilateral upper extremities    HPI [name] Short is a 82 y.o. female.  HPI Patient presents to the emergency department after a fall at her facility today.  She has had recurrent falls.  The fall today was unwitnessed and she was found out of bed.  She presents with abrasions and skin tears of her right forearm as well as pain in the right elbow is a small abrasion to her right lateral head.  She is eliquis.  As reported that she has recently seen neurology for these recurrent falls.  Patient was seen in emergency department 2 days ago for a fall.  Denies chest pain.  No abdominal pain.  No other complaints at this time    Past Medical History:  Diagnosis Date  . Anginal pain (HCC)    not in in last month  . Anxiety   . Arthritis    "hands" (10/03/2015)  . Atrial fibrillation (HCC)   . CAD (coronary artery disease) 03/01/2008   Lexiscan - EF 79, no ECG changes/EKG negative for ischemia, post-stress EF 86  . Cerebral atherosclerosis 10/15/2011   Extracranial Examination - mild-moderate amount smooth mixed density plaque elevating velocities within the proximal and mid segments of the internal carotid artery  . Chest pain 10/07/2009   2D Echo - EF 50-55, mitral valve calcified annulus  . CHF (congestive heart failure) (HCC)   . Chronic bronchitis (HCC)   . Claudication (HCC) 09/01/2010   LEA Doppler - Bilateral ABIs-demonstrated moderate arterial insufficiency to the lower extremities at rest, Right CIA/EIA-known occlusive disease, PV Cath  . Complication of anesthesia    "09/29/2015 put to sleep to take out wisdom tooth; took >12h to get her awake" (10/03/2015  . Coronary artery disease   . Depression   . Dyspnea 06/01/2012   2D Echo - EF 60-65, mitral valve calcified annulus, left  atrium severely dilated, right atrium moderately dilated  . GERD (gastroesophageal reflux disease)    takes prevacid  . High cholesterol   . Hypertension   . Hypothyroidism   . Memory loss   . Myocardial infarction   . PVD (peripheral vascular disease) (HCC)   . Stroke [facility](HCC) ?10/02/2015  . Thyroid disease   . Type II diabetes mellitus (HCC)   . Vascular dementia     Patient Active Problem List   Diagnosis Date Noted  . Acute CVA (cerebrovascular accident) (HCC) 10/03/2015  . Cerebellar stroke (HCC)   . Paroxysmal atrial fibrillation (HCC)   . HLD (hyperlipidemia)   . Stroke (HCC) 10/02/2015  . Acute embolic stroke (HCC) 10/02/2015  . Stroke (cerebrum) (HCC) 10/02/2015  . Acute respiratory failure with hypoxia (HCC) 10/02/2015  . Essential hypertension 03/12/2013  . Atherosclerosis of native arteries of the extremities with intermittent claudication 11/04/2012  . Preoperative clearance 09/25/2012  . CAD (coronary artery disease) of artery bypass graft 09/25/2012  . Peripheral vascular disease, unspecified 09/23/2012  . Aftercare following surgery of the circulatory system, NEC 09/23/2012  . Chest pain 08/11/2012  . Chronic atrial fibrillation- ventricular rate is controlled 08/11/2012  . Dyslipidemia 06/03/2012  . PVD (peripheral vascular disease), AOBF '94 06/03/2012  . NSVT, EF 60-65% by 2D this admission, beta blocker increased 06/03/2012  . CAD, CABG X  2 '93- Last cath 05/2012- no change from 2011, medical Rx- EF of 60-65% via echo 05/2012 06/03/2012  . CHF (congestive heart failure) - mild, BNP 3K 05/31/2012  . Unstable angina (HCC) 05/31/2012  . Diabetes mellitus type II, controlled (HCC) 05/31/2012  . Atrial fibrillation with controlled ventricular response (HCC) 05/31/2012    Past Surgical History:  Procedure Laterality Date  . ABDOMINAL HYSTERECTOMY    . ANGIOPLASTY Left 02/23/2010   Darcon patch, 80-90% stenosis proximal to the anastomosis  . AORTA - BILATERAL  FEMORAL ARTERY BYPASS GRAFT  2011  . AXILLARY-FEMORAL BYPASS GRAFT Right 10/08/2012   Procedure: BYPASS GRAFT AXILLA-BIFEMORAL;  Surgeon: Pryor Ochoa, MD;  Location: [facility] OR;  Service: Vascular;  Laterality: Right;  . CARDIAC CATHETERIZATION  2012  . CARDIAC CATHETERIZATION  06/02/2012   Unchanged anatomy compared to 3 years ago, continue medical therapy  . CARDIAC CATHETERIZATION  10/06/2009   Occluded right coronary artery, left-to-right collateral noncritical left disease with occluded grafts  . CATARACT EXTRACTION W/ INTRAOCULAR LENS  IMPLANT, BILATERAL Bilateral   . CORONARY ANGIOPLASTY    . CORONARY ARTERY BYPASS GRAFT  1993  . EYE SURGERY    . JOINT REPLACEMENT    . LEFT HEART CATHETERIZATION WITH CORONARY/GRAFT ANGIOGRAM N/A 06/02/2012   Procedure: LEFT HEART CATHETERIZATION WITH Isabel Caprice;  Surgeon: Runell Gess, MD;  Location: [facility] C[facility] LAB;  Service: Cardiovascular;  Laterality: N/A;  . TOTAL KNEE ARTHROPLASTY Left 2010    OB History    No data available       Home Medications    Prior to Admission medications   Medication Sig Start Date End Date Taking? Authorizing Provider  acetaminophen (TYLENOL) 325 MG tablet Take 650 mg by mouth 4 (four) times daily.    Yes Historical Provider, MD  acetaminophen (TYLENOL) 500 MG tablet Take 500 mg by mouth every 4 (four) hours as needed for mild pain, moderate pain, fever or headache.   Yes Historical Provider, MD  alum & mag hydroxide-simeth (MINTOX) 200-200-20 MG/5ML suspension Take 30 mLs by mouth every 6 (six) hours as needed for indigestion or heartburn.   Yes Historical Provider, MD  apixaban (ELIQUIS) 2.5 MG TABS tablet Take 1 tablet (2.5 mg total) by mouth 2 (two) times daily. 10/05/15  Yes Rhetta Mura, MD  aspirin EC 81 MG tablet Take 81 mg by mouth daily.   Yes Historical Provider, MD  busPIRone (BUSPAR) 15 MG tablet Take 15 mg by mouth 2 (two) times daily.   Yes Historical Provider, MD  calcium-vitamin  D (OSCAL WITH D) 500-200 MG-UNIT tablet Take 1 tablet by mouth 2 (two) times daily.   Yes Historical Provider, MD  cetirizine (ZYRTEC) 10 MG tablet Take 10 mg by mouth daily.   Yes Historical Provider, MD  diphenhydrAMINE-zinc acetate (BENADRYL) cream Apply 1 application topically 2 (two) times daily.   Yes Historical Provider, MD  famotidine (PEPCID) 20 MG tablet Take 20 mg by mouth daily.   Yes Historical Provider, MD  furosemide (LASIX) 20 MG tablet Take 20 mg by mouth daily.   Yes Historical Provider, MD  guaifenesin (ROBITUSSIN) 100 MG/5ML syrup Take 200 mg by mouth every 6 (six) hours as needed for cough.    Yes Historical Provider, MD  HYDROcodone-acetaminophen (NORCO/VICODIN) 5-325 MG tablet Take 0.5 to 1 tablet by mouth every 6 (six) hours as needed for severe pain. 03/20/16  Yes Jaime Pilcher Ward, PA-C  Ipratropium-Albuterol (COMBIVENT RESPIMAT) 20-100 MCG/ACT AERS respimat Inhale 1 puff into  the lungs every 6 (six) hours as needed for wheezing or shortness of breath.    Yes Historical Provider, MD  levothyroxine (SYNTHROID, LEVOTHROID) 25 MCG tablet Take 37.5 mcg by mouth daily before breakfast.   Yes Historical Provider, MD  loperamide (IMODIUM) 2 MG capsule Take 2 mg by mouth as needed for diarrhea or loose stools.    Yes Historical Provider, MD  losartan (COZAAR) 100 MG tablet Take 100 mg by mouth daily.   Yes Historical Provider, MD  magnesium hydroxide (MILK OF MAGNESIA) 400 MG/5ML suspension Take 30 mLs by mouth at bedtime as needed for mild constipation.    Yes Historical Provider, MD  metoprolol succinate (TOPROL-XL) 25 MG 24 hr tablet Take 25 mg by mouth at bedtime.   Yes Historical Provider, MD  Neomycin-Bacitracin-Polymyxin (TRIPLE ANTIBIOTIC) [phone] OINT Apply 1 application topically as needed (for minor skin tears/abrasions).   Yes Historical Provider, MD  nitroGLYCERIN (NITROSTAT) 0.4 MG SL tablet Place 0.4 mg under the tongue every 5 (five) minutes as needed for chest  pain.    Yes Historical Provider, MD  omega-3 acid ethyl esters (LOVAZA) 1 G capsule Take 2 g by mouth daily.   Yes Historical Provider, MD  PARoxetine (PAXIL) 30 MG tablet Take 30 mg by mouth daily.   Yes Historical Provider, MD  polyvinyl alcohol (LIQUIFILM TEARS) 1.4 % ophthalmic solution Place 1 drop into both eyes 4 (four) times daily.    Yes Historical Provider, MD  potassium chloride SA (K-DUR,KLOR-CON) 20 MEQ tablet Take 20 mEq by mouth daily.   Yes Historical Provider, MD  ranolazine (RANEXA) 500 MG 12 hr tablet Take 1 tablet (500 mg total) by mouth 2 (two) times daily. 08/12/12  Yes Brittainy Sherlynn Carbon, PA-C  rivastigmine (EXELON) 4.6 mg/24hr Place 4.6 mg onto the skin daily.   Yes Historical Provider, MD  senna (SENOKOT) 8.6 MG tablet Take 1 tablet by mouth at bedtime.   Yes Historical Provider, MD  simvastatin (ZOCOR) 10 MG tablet Take 10 mg by mouth at bedtime.    Yes Historical Provider, MD  traZODone (DESYREL) 50 MG tablet Take 75 mg by mouth at bedtime.   Yes Historical Provider, MD  UNABLE TO FIND Take 1 each by mouth 3 (three) times daily. Med Name: Mighty Shakes   Yes Historical Provider, MD  albuterol (PROVENTIL HFA;VENTOLIN HFA) 108 (90 BASE) MCG/ACT inhaler Inhale 1-2 puffs into the lungs every 6 (six) hours as needed for wheezing. Patient not taking: Reported on 04/25/2016 12/31/12   Marlon Pel, PA-C    Family History Family History  Problem Relation Age of Onset  . Heart disease Father   . Hypertension Father   . Heart attack Father   . Kidney failure Brother   . Hyperlipidemia Daughter     Social History Social History  Substance Use Topics  . Smoking status: Former Smoker    Packs/day: 1.00    Years: 50.00    Types: Cigarettes    Quit date: 05/15/1991  . Smokeless tobacco: Never Used  . Alcohol use No     Allergies   Codeine   Review of Systems Review of Systems  All other systems reviewed and are negative.    Physical Exam Updated Vital  Signs BP 160/96   Pulse 94   Temp 98 F (36.7 C) (Oral)   Resp 22   SpO2 90%   Physical Exam  Constitutional: She appears well-developed and well-nourished. No distress.  HENT:  Head: Normocephalic and atraumatic.  Eyes: EOM are normal.  Neck: Neck supple.  Mild cervical paracervical tenderness without cervical step-off.  Cardiovascular: Normal rate, regular rhythm and normal heart sounds.   Pulmonary/Chest: Effort normal and breath sounds normal.  Abdominal: Soft. She exhibits no distension. There is no tenderness.  Musculoskeletal:  Full range of motion bilateral hips, knees, ankles.  Full range of motion bilateral shoulders, wrists.  Mild pain with range of motion of the right elbow without obvious deformity.  Multiple skin tears to the right posterior forearm without active bleeding.  Neurological: She is alert.  Moves all extremities equally.  Grip strength equal bilaterally.  Skin: Skin is warm and dry.  Psychiatric: She has a normal mood and affect. Judgment normal.  Nursing note and vitals reviewed.    ED Treatments / Results  Labs (all labs ordered are listed, but only abnormal results are displayed) Labs Reviewed  CBC WITH DIFFERENTIAL/PLATELET - Abnormal; Notable for the following:       Result Value   WBC 10.9 (*)    RDW 16.5 (*)    Neutro Abs 8.5 (*)    Monocytes Absolute 1.3 (*)    All other components within normal limits  COMPREHENSIVE METABOLIC PANEL - Abnormal; Notable for the following:    Chloride 100 (*)    Glucose, Bld 131 (*)    Creatinine, Ser 1.08 (*)    Total Protein 6.4 (*)    Total Bilirubin 2.0 (*)    GFR calc non Af Amer 45 (*)    GFR calc Af Amer 52 (*)    All other components within normal limits  URINALYSIS, ROUTINE W REFLEX MICROSCOPIC - Abnormal; Notable for the following:    Color, Urine AMBER (*)    Protein, ur 30 (*)    Leukocytes, UA SMALL (*)    Bacteria, UA RARE (*)    All other components within normal limits  URINE  CULTURE    EKG  EKG Interpretation None       Radiology Dg Chest 2 View  Result Date: 04/27/2016 CLINICAL DATA:  Unwitnessed fall. EXAM: CHEST  2 VIEW COMPARISON:  Radiographs of April 04, 2016. FINDINGS: Stable cardiomegaly. Status post coronary artery bypass graft. No pneumothorax is noted. Probable mild left pleural effusion is noted with adjacent subsegmental atelectasis. Atherosclerosis of thoracic aorta is noted. Bony thorax is unremarkable. IMPRESSION: Probable mild left basilar subsegmental atelectasis with associated pleural effusion. Electronically Signed   By: Lupita Raider, M.D.   On: 04/27/2016 10:08   Dg Pelvis 1-2 Views  Result Date: 04/27/2016 CLINICAL DATA:  Fall. EXAM: PELVIS - 1-2 VIEW COMPARISON:  CT pelvis 04/03/2016 FINDINGS: There is no evidence of pelvic fracture or diastasis. No pelvic bone lesions are seen. Surgical clips in the groin bilaterally from prior vascular bypass grafting. IMPRESSION: Negative. Electronically Signed   By: Marlan Palau M.D.   On: 04/27/2016 10:08   Dg Forearm Right  Result Date: 04/27/2016 CLINICAL DATA:  Fall. EXAM: RIGHT FOREARM - 2 VIEW COMPARISON:  None. FINDINGS: Irregularity of the radial neck could be due to degenerative spurring versus nondisplaced fracture. Correlate with pain and follow-up x-rays. Degenerative change in the elbow joint. Soft tissue calcification ventral to the distal humerus. Degenerative change in the wrist joint with chondrocalcinosis. IMPRESSION: Cannot exclude radial neck fracture. Correlate with pain in follow-up x-rays if appropriate. Electronically Signed   By: Marlan Palau M.D.   On: 04/27/2016 10:07   Ct Head Wo Contrast  Result Date: 04/27/2016 CLINICAL  DATA:  The patient fell out of bed last night. She was found down. Initial encounter. EXAM: CT HEAD WITHOUT CONTRAST CT CERVICAL SPINE WITHOUT CONTRAST TECHNIQUE: Multidetector CT imaging of the head and cervical spine was performed following  the standard protocol without intravenous contrast. Multiplanar CT image reconstructions of the cervical spine were also generated. COMPARISON:  Head and cervical spine CT scans 03/20/2016. Head CT scan 04/25/2016. FINDINGS: CT HEAD FINDINGS Brain: Cortical atrophy and chronic microvascular ischemic change are identified. No evidence of acute intracranial abnormality including hemorrhage, infarct, mass lesion, mass effect, midline shift or abnormal extra-axial fluid collection is identified. No hydrocephalus or pneumocephalus. Vascular: Atherosclerotic vascular disease is seen. Skull: Intact.  No focal lesion. Sinuses/Orbits: Negative. Other: Scalp hematoma over the right parietal bone is identified. CT CERVICAL SPINE FINDINGS Alignment: Maintained. Skull base and vertebrae: No acute fracture or focal lesion. Soft tissues and spinal canal: No prevertebral fluid or swelling. No visible canal hematoma. Disc levels: Scattered facet degenerative change is again seen. Loss of disc space height and endplate spurring appear worst at C4-5 and C6-7. Upper chest: Atherosclerosis noted. Other: None. IMPRESSION: Scalp hematoma over the right parietal bone. No acute intracranial abnormality. No acute abnormality cervical spine. Atrophy and chronic microvascular ischemic change. Atherosclerosis. Electronically Signed   By: Drusilla Kannerhomas  Dalessio M.D.   On: 04/27/2016 10:49   Ct Head Wo Contrast  Result Date: 04/25/2016 CLINICAL DATA:  82 y/o F; fell from wheelchair with injury to back of head and pain. EXAM: CT HEAD WITHOUT CONTRAST TECHNIQUE: Contiguous axial images were obtained from the base of the skull through the vertex without intravenous contrast. COMPARISON:  04/16/2016 CT of the head. FINDINGS: Brain: No evidence of acute infarction, hemorrhage, hydrocephalus, extra-axial collection or mass lesion/mass effect. Patchy foci of hypoattenuation within subcortical and periventricular white matter are compatible with moderate  chronic microvascular ischemic changes. Lucency in the right became in is unchanged consistent with chronic lacunar infarct. Mild brain parenchymal volume loss. Vascular: Extensive calcific atherosclerosis of the cavernous internal carotid arteries. Skull: Right parietal scalp small contusion. No displaced calvarial fracture. Sinuses/Orbits: No acute finding. Underpneumatized frontal sinuses. Bilateral intra-ocular lens replacement. Other: None. IMPRESSION: 1. Right parietal small scalp contusion. No displaced calvarial fracture. 2. No acute intracranial abnormality is identified. 3. Stable brain parenchymal volume loss and chronic microvascular ischemic changes. Electronically Signed   By: Mitzi HansenLance  Furusawa-Stratton M.D.   On: 04/25/2016 23:03   Ct Cervical Spine Wo Contrast  Result Date: 04/27/2016 CLINICAL DATA:  The patient fell out of bed last night. She was found down. Initial encounter. EXAM: CT HEAD WITHOUT CONTRAST CT CERVICAL SPINE WITHOUT CONTRAST TECHNIQUE: Multidetector CT imaging of the head and cervical spine was performed following the standard protocol without intravenous contrast. Multiplanar CT image reconstructions of the cervical spine were also generated. COMPARISON:  Head and cervical spine CT scans 03/20/2016. Head CT scan 04/25/2016. FINDINGS: CT HEAD FINDINGS Brain: Cortical atrophy and chronic microvascular ischemic change are identified. No evidence of acute intracranial abnormality including hemorrhage, infarct, mass lesion, mass effect, midline shift or abnormal extra-axial fluid collection is identified. No hydrocephalus or pneumocephalus. Vascular: Atherosclerotic vascular disease is seen. Skull: Intact.  No focal lesion. Sinuses/Orbits: Negative. Other: Scalp hematoma over the right parietal bone is identified. CT CERVICAL SPINE FINDINGS Alignment: Maintained. Skull base and vertebrae: No acute fracture or focal lesion. Soft tissues and spinal canal: No prevertebral fluid or  swelling. No visible canal hematoma. Disc levels: Scattered facet degenerative change is again  seen. Loss of disc space height and endplate spurring appear worst at C4-5 and C6-7. Upper chest: Atherosclerosis noted. Other: None. IMPRESSION: Scalp hematoma over the right parietal bone. No acute intracranial abnormality. No acute abnormality cervical spine. Atrophy and chronic microvascular ischemic change. Atherosclerosis. Electronically Signed   By: Drusilla Kanner M.D.   On: 04/27/2016 10:49   Dg Hip Unilat W Or Wo Pelvis 2-3 Views Right  Result Date: 04/25/2016 CLINICAL DATA:  Fall with pain EXAM: DG HIP (WITH OR WITHOUT PELVIS) 2-3V RIGHT COMPARISON:  None. FINDINGS: Surgical clips over the bilateral groins. No acute fracture or dislocation. Pubic symphysis appears intact. SI joints are symmetric. Vascular calcifications in the pelvis and right groin. IMPRESSION: No radiographic evidence for acute osseous abnormality Electronically Signed   By: Jasmine Pang M.D.   On: 04/25/2016 22:32    Procedures Procedures (including critical care time)  Medications Ordered in ED Medications  morphine 2 MG/ML injection 2 mg (2 mg Intravenous Given 04/27/16 1018)  cephALEXin (KEFLEX) capsule 500 mg (500 mg Oral Given 04/27/16 1218)  bacitracin ointment (1 application Topical Given 04/27/16 1348)     Initial Impression / Assessment and Plan / ED Course  I have reviewed the triage vital signs and the nursing notes.  Pertinent labs & imaging results that were available during my care of the patient were reviewed by me and considered in my medical decision making (see chart for details).  Clinical Course     Workup and imaging without acute abnormality.  Patient will need ongoing primary care and neurology follow-up for recurrent falls.  She may benefit from 24-hour nursing   Final Clinical Impressions(s) / ED Diagnoses   Final diagnoses:  Fall, initial encounter  Injury of head, initial encounter   Closed nondisplaced fracture of head of right radius, initial encounter    New Prescriptions New Prescriptions   No medications on file     Azalia Bilis, MD 04/27/16 1420

## 2016-04-27 NOTE — ED Notes (Signed)
Daughter Stanton KidneyDebra and Nh notified of patients d/c instructions to f/u with orth and keep splint clean and dry.

## 2016-04-27 NOTE — ED Notes (Signed)
Patient transported to X-ray 

## 2016-04-27 NOTE — Progress Notes (Signed)
[facility]

## 2016-04-27 NOTE — ED Triage Notes (Signed)
Pt with unwitnessed fall out of bed, found in between dresser and bed and unknown how Maharaj patient had been like that. Pt arrived with multiple skin tears, lac to R head, c/o head pain. EMS states bruising on face is from prior fall.

## 2016-04-27 NOTE — ED Notes (Signed)
Bed: WA15 Expected date:  Expected time:  Means of arrival:  Comments: EMS-fall 

## 2016-04-27 NOTE — ED Notes (Signed)
Ortho tech called 

## 2016-04-27 NOTE — ED Notes (Signed)
[facility] arrived.  

## 2016-04-27 NOTE — ED Notes (Signed)
Completed in and out cath and patient with bruising to R flank, hematoma to R thigh.

## 2016-04-27 NOTE — Progress Notes (Signed)
CSW attempted to speak with patient. Patient out of the room at the time.  Posey ReaLaVonia Jennika Ringgold, LCSWA Clinical Social Worker [phone] 9:50 AM

## 2016-04-29 ENCOUNTER — Encounter (HOSPITAL_COMMUNITY): Payer: Self-pay | Admitting: Emergency Medicine

## 2016-04-29 ENCOUNTER — Emergency Department (HOSPITAL_COMMUNITY)
Admission: EM | Admit: 2016-04-29 | Discharge: 2016-04-29 | Disposition: A | Discharge status: Home / Self Care | Payer: Medicare Other | Attending: Emergency Medicine | Admitting: Emergency Medicine

## 2016-04-29 ENCOUNTER — Emergency Department (HOSPITAL_COMMUNITY): Payer: Medicare Other

## 2016-04-29 DIAGNOSIS — S0083XA Contusion of other part of head, initial encounter: Secondary | ICD-10-CM | POA: Insufficient documentation

## 2016-04-29 DIAGNOSIS — T07XXXA Unspecified multiple injuries, initial encounter: Secondary | ICD-10-CM

## 2016-04-29 DIAGNOSIS — Z7982 Long term (current) use of aspirin: Secondary | ICD-10-CM | POA: Diagnosis not present

## 2016-04-29 DIAGNOSIS — S0990XA Unspecified injury of head, initial encounter: Secondary | ICD-10-CM | POA: Insufficient documentation

## 2016-04-29 DIAGNOSIS — Z951 Presence of aortocoronary bypass graft: Secondary | ICD-10-CM | POA: Diagnosis not present

## 2016-04-29 DIAGNOSIS — Y999 Unspecified external cause status: Secondary | ICD-10-CM | POA: Diagnosis not present

## 2016-04-29 DIAGNOSIS — I252 Old myocardial infarction: Secondary | ICD-10-CM | POA: Diagnosis not present

## 2016-04-29 DIAGNOSIS — R079 Chest pain, unspecified: Secondary | ICD-10-CM | POA: Diagnosis not present

## 2016-04-29 DIAGNOSIS — Z79899 Other long term (current) drug therapy: Secondary | ICD-10-CM | POA: Insufficient documentation

## 2016-04-29 DIAGNOSIS — Y939 Activity, unspecified: Secondary | ICD-10-CM | POA: Insufficient documentation

## 2016-04-29 DIAGNOSIS — S20229A Contusion of unspecified back wall of thorax, initial encounter: Secondary | ICD-10-CM | POA: Insufficient documentation

## 2016-04-29 DIAGNOSIS — E119 Type 2 diabetes mellitus without complications: Secondary | ICD-10-CM | POA: Insufficient documentation

## 2016-04-29 DIAGNOSIS — I509 Heart failure, unspecified: Secondary | ICD-10-CM | POA: Insufficient documentation

## 2016-04-29 DIAGNOSIS — W06XXXA Fall from bed, initial encounter: Secondary | ICD-10-CM | POA: Diagnosis not present

## 2016-04-29 DIAGNOSIS — I2510 Atherosclerotic heart disease of native coronary artery without angina pectoris: Secondary | ICD-10-CM | POA: Diagnosis not present

## 2016-04-29 DIAGNOSIS — R296 Repeated falls: Secondary | ICD-10-CM

## 2016-04-29 DIAGNOSIS — S7011XA Contusion of right thigh, initial encounter: Secondary | ICD-10-CM | POA: Insufficient documentation

## 2016-04-29 DIAGNOSIS — S3632XA Contusion of stomach, initial encounter: Secondary | ICD-10-CM | POA: Diagnosis not present

## 2016-04-29 DIAGNOSIS — Z8673 Personal history of transient ischemic attack (TIA), and cerebral infarction without residual deficits: Secondary | ICD-10-CM | POA: Insufficient documentation

## 2016-04-29 DIAGNOSIS — Y929 Unspecified place or not applicable: Secondary | ICD-10-CM | POA: Diagnosis not present

## 2016-04-29 DIAGNOSIS — E039 Hypothyroidism, unspecified: Secondary | ICD-10-CM | POA: Insufficient documentation

## 2016-04-29 DIAGNOSIS — I110 Hypertensive heart disease with heart failure: Secondary | ICD-10-CM | POA: Diagnosis not present

## 2016-04-29 DIAGNOSIS — R935 Abnormal findings on diagnostic imaging of other abdominal regions, including retroperitoneum: Secondary | ICD-10-CM | POA: Insufficient documentation

## 2016-04-29 DIAGNOSIS — Z87891 Personal history of nicotine dependence: Secondary | ICD-10-CM | POA: Diagnosis not present

## 2016-04-29 DIAGNOSIS — W19XXXA Unspecified fall, initial encounter: Secondary | ICD-10-CM

## 2016-04-29 DIAGNOSIS — Z96652 Presence of left artificial knee joint: Secondary | ICD-10-CM | POA: Insufficient documentation

## 2016-04-29 DIAGNOSIS — S0993XA Unspecified injury of face, initial encounter: Secondary | ICD-10-CM | POA: Diagnosis present

## 2016-04-29 LAB — BASIC METABOLIC PANEL
Anion gap: 11 (ref 5–15)
BUN: 15 mg/dL (ref 6–20)
CO2: 27 mmol/L (ref 22–32)
Calcium: 9.3 mg/dL (ref 8.9–10.3)
Chloride: 101 mmol/L (ref 101–111)
Creatinine, Ser: 0.98 mg/dL (ref 0.44–1.00)
GFR calc Af Amer: 59 mL/min — ABNORMAL LOW (ref >60)
GFR, EST NON AFRICAN AMERICAN: 51 mL/min — AB (ref >60)
GLUCOSE: 128 mg/dL — AB (ref 65–99)
POTASSIUM: 3.7 mmol/L (ref 3.5–5.1)
Sodium: 139 mmol/L (ref 135–145)

## 2016-04-29 LAB — CBC WITH DIFFERENTIAL/PLATELET
Basophils Absolute: 0 10*3/uL (ref 0.0–0.1)
Basophils Relative: 0 %
EOS PCT: 3 %
Eosinophils Absolute: 0.4 10*3/uL (ref 0.0–0.7)
HEMATOCRIT: 39.1 % (ref 36.0–46.0)
Hemoglobin: 12.3 g/dL (ref 12.0–15.0)
LYMPHS ABS: 1.2 10*3/uL (ref 0.7–4.0)
LYMPHS PCT: 10 %
MCH: 27 pg (ref 26.0–34.0)
MCHC: 31.5 g/dL (ref 30.0–36.0)
MCV: 85.7 fL (ref 78.0–100.0)
MONO ABS: 1.4 10*3/uL — AB (ref 0.1–1.0)
Monocytes Relative: 12 %
Neutro Abs: 8.5 10*3/uL — ABNORMAL HIGH (ref 1.7–7.7)
Neutrophils Relative %: 75 %
PLATELETS: 239 10*3/uL (ref 150–400)
RBC: 4.56 MIL/uL (ref 3.87–5.11)
RDW: 16.6 % — AB (ref 11.5–15.5)
WBC: 11.5 10*3/uL — ABNORMAL HIGH (ref 4.0–10.5)

## 2016-04-29 LAB — PROTIME-INR
INR: 1.69
Prothrombin Time: 20.1 seconds — ABNORMAL HIGH (ref 11.4–15.2)

## 2016-04-29 LAB — URINE CULTURE

## 2016-04-29 LAB — CBG MONITORING, ED: Glucose-Capillary: 105 mg/dL — ABNORMAL HIGH (ref 65–99)

## 2016-04-29 MED ORDER — IOPAMIDOL (ISOVUE-300) INJECTION 61%
INTRAVENOUS | Status: AC
  Administered 2016-04-29: 75 mL
  Filled 2016-04-29: qty 75
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
## 2016-04-29 NOTE — ED Triage Notes (Signed)
[facility] being found next to her bed on the floor.  Has had several falls recently. Staff unable to differentiate if there are new wounds tonight compared to previous ones.  Pt does have bruising on her arms, a hematoma on the back of her head and on the right temporal area.  Right knee and leg are painful and appear to have newer looking bruises.  Pt has dementia.  Pt is on Eliquis. Only complaints of pain are to right knee and leg.

## 2016-04-29 NOTE — Discharge Instructions (Signed)
Please recheck with her doctor this week.  Walk only with assistance.

## 2016-04-29 NOTE — ED Provider Notes (Signed)
82 year old female accepted in signout from Dr. Consuella Loseancourt. She has had assisted living care facility. She has a history of dementia. She has had multiple recent falls. Today she was found found on the floor by her bed. He has a history of atrial fibrillation and is on anticoagulation. Dr. Daron Offerancourt's workup included a EKG, electrolytes, CBC, CT of head, neck, chest, abdomen, and pelvis. No acute fractures or bleeding is noted. White blood cell count is slightly elevated at 11,500 with an otherwise normal CBC.  Electrolytes are normal. You are investigating the living situation pending discharge.  Patient continues hemodynamically stable, sats upper 90s.   Please see sw note.    Margarita Grizzleanielle Destini Cambre, MD 04/29/16 1409
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
## 2016-04-29 NOTE — ED Notes (Signed)
Pt transported to radiology.

## 2016-04-29 NOTE — ED Notes (Signed)
Pt returned from xray

## 2016-04-30 ENCOUNTER — Telehealth (HOSPITAL_BASED_OUTPATIENT_CLINIC_OR_DEPARTMENT_OTHER): Payer: Self-pay | Admitting: Emergency Medicine
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
## 2016-04-30 NOTE — Progress Notes (Signed)
ED Antimicrobial Stewardship Positive Culture Follow Up   [name] SharpsMary Hoffmeister is an 82 y.o. female [facility] with a chief complaint of  Chief Complaint  Patient presents with  . Fall    Recent Results (from the past 720 hour(s))  Urine culture     Status: Abnormal   Collection Time: 04/03/16  1:59 AM  Result Value Ref Range Status   Specimen Description URINE, CATHETERIZED  Final   Special Requests NONE  Final   Culture <10,000 COLONIES/mL INSIGNIFICANT GROWTH (A)  Final   Report Status 04/04/2016 FINAL  Final  Urine culture     Status: None   Collection Time: 04/04/16  8:50 AM  Result Value Ref Range Status   Specimen Description URINE, RANDOM  Final   Special Requests NONE  Final   Culture NO GROWTH  Final   Report Status 04/05/2016 FINAL  Final  Urine culture     Status: Abnormal   Collection Time: 04/27/16 10:17 AM  Result Value Ref Range Status   Specimen Description URINE, CATHETERIZED  Final   Special Requests NONE  Final   Culture >=100,000 COLONIES/mL ENTEROCOCCUS FAECALIS (A)  Final   Report Status 04/29/2016 FINAL  Final   Organism ID, Bacteria ENTEROCOCCUS FAECALIS (A)  Final      Susceptibility   Enterococcus faecalis - MIC*    AMPICILLIN <=2 SENSITIVE Sensitive     LEVOFLOXACIN 1 SENSITIVE Sensitive     NITROFURANTOIN <=16 SENSITIVE Sensitive     VANCOMYCIN 1 SENSITIVE Sensitive     * >=100,000 COLONIES/mL ENTEROCOCCUS FAECALIS    [x]  Patient discharged originally without antimicrobial agent and treatment is now indicated  New antibiotic prescription: Amoxicillin 500mg  PO BID x 5 days  ED Provider: Terance HartKelly Gekas PA-C  Armandina StammerBATCHELDER,Ellenie Salome J 04/30/2016, 9:41 AM Infectious Diseases Pharmacist Phone# [phone]

## 2016-04-30 NOTE — Telephone Encounter (Signed)
Post ED Visit - Positive Culture Follow-up: Successful Patient Follow-Up  Culture assessed and recommendations reviewed by: [x]  Enzo BiNathan Batchelder, Pharm.D. []  Celedonio MiyamotoJeremy Frens, Pharm.D., BCPS []  Garvin FilaMike Maccia, Pharm.D. []  Georgina PillionElizabeth Martin, Pharm.D., BCPS []  [place] MileMinh Pham, [address] Rainbow BoulevardPharm.D., BCPS, AAHIVP []  Estella HuskMichelle Turner, Pharm.D., BCPS, AAHIVP []  Tennis Mustassie Stewart, Pharm.D. []  Sherle Poeob Vincent, Vermont[place]arm.D.  Positive urine culture  [x]  Patient discharged without antimicrobial prescription and treatment is now indicated []  Organism is resistant to prescribed ED discharge antimicrobial []  Patient with positive blood cultures  Changes discussed with ED provider: Terance HartKelly Gekas PA New antibiotic prescription start Amoxicillin 500 mg po bid x 5days  Order faxed to [phone] attn [name] @ [facility]    [name], [name] Brandel 04/30/2016, 11:13 AM

## 2016-05-02 ENCOUNTER — Observation Stay (HOSPITAL_COMMUNITY)
Admission: EM | Admit: 2016-05-02 | Discharge: 2016-05-06 | Disposition: A | Discharge status: Skilled Nursing Facility | Payer: Medicare Other | Attending: Internal Medicine | Admitting: Internal Medicine

## 2016-05-02 ENCOUNTER — Emergency Department (HOSPITAL_COMMUNITY): Payer: Medicare Other

## 2016-05-02 ENCOUNTER — Encounter (HOSPITAL_COMMUNITY): Payer: Self-pay | Admitting: Emergency Medicine

## 2016-05-02 DIAGNOSIS — I4891 Unspecified atrial fibrillation: Secondary | ICD-10-CM | POA: Diagnosis not present

## 2016-05-02 DIAGNOSIS — E1151 Type 2 diabetes mellitus with diabetic peripheral angiopathy without gangrene: Principal | ICD-10-CM | POA: Insufficient documentation

## 2016-05-02 DIAGNOSIS — I70219 Atherosclerosis of native arteries of extremities with intermittent claudication, unspecified extremity: Secondary | ICD-10-CM | POA: Diagnosis not present

## 2016-05-02 DIAGNOSIS — W050XXA Fall from non-moving wheelchair, initial encounter: Secondary | ICD-10-CM | POA: Diagnosis not present

## 2016-05-02 DIAGNOSIS — M8588 Other specified disorders of bone density and structure, other site: Secondary | ICD-10-CM | POA: Insufficient documentation

## 2016-05-02 DIAGNOSIS — I2721 Secondary pulmonary arterial hypertension: Secondary | ICD-10-CM | POA: Insufficient documentation

## 2016-05-02 DIAGNOSIS — W11XXXA Fall on and from ladder, initial encounter: Secondary | ICD-10-CM | POA: Diagnosis not present

## 2016-05-02 DIAGNOSIS — F0150 Vascular dementia without behavioral disturbance: Secondary | ICD-10-CM | POA: Insufficient documentation

## 2016-05-02 DIAGNOSIS — S0003XA Contusion of scalp, initial encounter: Secondary | ICD-10-CM | POA: Insufficient documentation

## 2016-05-02 DIAGNOSIS — F329 Major depressive disorder, single episode, unspecified: Secondary | ICD-10-CM | POA: Insufficient documentation

## 2016-05-02 DIAGNOSIS — E119 Type 2 diabetes mellitus without complications: Secondary | ICD-10-CM

## 2016-05-02 DIAGNOSIS — I5033 Acute on chronic diastolic (congestive) heart failure: Secondary | ICD-10-CM | POA: Diagnosis not present

## 2016-05-02 DIAGNOSIS — I998 Other disorder of circulatory system: Secondary | ICD-10-CM | POA: Diagnosis not present

## 2016-05-02 DIAGNOSIS — L0390 Cellulitis, unspecified: Secondary | ICD-10-CM | POA: Diagnosis present

## 2016-05-02 DIAGNOSIS — L03115 Cellulitis of right lower limb: Secondary | ICD-10-CM

## 2016-05-02 DIAGNOSIS — R600 Localized edema: Secondary | ICD-10-CM | POA: Diagnosis present

## 2016-05-02 DIAGNOSIS — K5730 Diverticulosis of large intestine without perforation or abscess without bleeding: Secondary | ICD-10-CM | POA: Insufficient documentation

## 2016-05-02 DIAGNOSIS — I480 Paroxysmal atrial fibrillation: Secondary | ICD-10-CM | POA: Insufficient documentation

## 2016-05-02 DIAGNOSIS — Z96652 Presence of left artificial knee joint: Secondary | ICD-10-CM | POA: Insufficient documentation

## 2016-05-02 DIAGNOSIS — Z66 Do not resuscitate: Secondary | ICD-10-CM | POA: Insufficient documentation

## 2016-05-02 DIAGNOSIS — R296 Repeated falls: Secondary | ICD-10-CM | POA: Diagnosis not present

## 2016-05-02 DIAGNOSIS — K219 Gastro-esophageal reflux disease without esophagitis: Secondary | ICD-10-CM | POA: Diagnosis not present

## 2016-05-02 DIAGNOSIS — M19042 Primary osteoarthritis, left hand: Secondary | ICD-10-CM | POA: Insufficient documentation

## 2016-05-02 DIAGNOSIS — W06XXXA Fall from bed, initial encounter: Secondary | ICD-10-CM | POA: Diagnosis not present

## 2016-05-02 DIAGNOSIS — I252 Old myocardial infarction: Secondary | ICD-10-CM | POA: Diagnosis not present

## 2016-05-02 DIAGNOSIS — D72829 Elevated white blood cell count, unspecified: Secondary | ICD-10-CM

## 2016-05-02 DIAGNOSIS — R262 Difficulty in walking, not elsewhere classified: Secondary | ICD-10-CM

## 2016-05-02 DIAGNOSIS — E7800 Pure hypercholesterolemia, unspecified: Secondary | ICD-10-CM | POA: Insufficient documentation

## 2016-05-02 DIAGNOSIS — R52 Pain, unspecified: Secondary | ICD-10-CM

## 2016-05-02 DIAGNOSIS — I672 Cerebral atherosclerosis: Secondary | ICD-10-CM | POA: Diagnosis not present

## 2016-05-02 DIAGNOSIS — Z8249 Family history of ischemic heart disease and other diseases of the circulatory system: Secondary | ICD-10-CM | POA: Insufficient documentation

## 2016-05-02 DIAGNOSIS — E785 Hyperlipidemia, unspecified: Secondary | ICD-10-CM | POA: Insufficient documentation

## 2016-05-02 DIAGNOSIS — Z7982 Long term (current) use of aspirin: Secondary | ICD-10-CM | POA: Insufficient documentation

## 2016-05-02 DIAGNOSIS — I10 Essential (primary) hypertension: Secondary | ICD-10-CM | POA: Diagnosis present

## 2016-05-02 DIAGNOSIS — Z8673 Personal history of transient ischemic attack (TIA), and cerebral infarction without residual deficits: Secondary | ICD-10-CM | POA: Insufficient documentation

## 2016-05-02 DIAGNOSIS — Z885 Allergy status to narcotic agent status: Secondary | ICD-10-CM | POA: Insufficient documentation

## 2016-05-02 DIAGNOSIS — Z87440 Personal history of urinary (tract) infections: Secondary | ICD-10-CM | POA: Diagnosis not present

## 2016-05-02 DIAGNOSIS — S42301A Unspecified fracture of shaft of humerus, right arm, initial encounter for closed fracture: Secondary | ICD-10-CM | POA: Insufficient documentation

## 2016-05-02 DIAGNOSIS — I25810 Atherosclerosis of coronary artery bypass graft(s) without angina pectoris: Secondary | ICD-10-CM | POA: Diagnosis not present

## 2016-05-02 DIAGNOSIS — I700 Atherosclerosis of aorta: Secondary | ICD-10-CM | POA: Insufficient documentation

## 2016-05-02 DIAGNOSIS — E039 Hypothyroidism, unspecified: Secondary | ICD-10-CM | POA: Insufficient documentation

## 2016-05-02 DIAGNOSIS — I739 Peripheral vascular disease, unspecified: Secondary | ICD-10-CM | POA: Diagnosis present

## 2016-05-02 DIAGNOSIS — Z7901 Long term (current) use of anticoagulants: Secondary | ICD-10-CM | POA: Insufficient documentation

## 2016-05-02 DIAGNOSIS — F419 Anxiety disorder, unspecified: Secondary | ICD-10-CM | POA: Diagnosis not present

## 2016-05-02 DIAGNOSIS — R627 Adult failure to thrive: Secondary | ICD-10-CM | POA: Insufficient documentation

## 2016-05-02 DIAGNOSIS — M19041 Primary osteoarthritis, right hand: Secondary | ICD-10-CM | POA: Diagnosis not present

## 2016-05-02 DIAGNOSIS — I110 Hypertensive heart disease with heart failure: Secondary | ICD-10-CM | POA: Diagnosis not present

## 2016-05-02 DIAGNOSIS — Z87891 Personal history of nicotine dependence: Secondary | ICD-10-CM | POA: Insufficient documentation

## 2016-05-02 LAB — COMPREHENSIVE METABOLIC PANEL
ALBUMIN: 3.8 g/dL (ref 3.5–5.0)
ALT: 16 U/L (ref 14–54)
ANION GAP: 9 (ref 5–15)
AST: 32 U/L (ref 15–41)
Alkaline Phosphatase: 136 U/L — ABNORMAL HIGH (ref 38–126)
BILIRUBIN TOTAL: 1.7 mg/dL — AB (ref 0.3–1.2)
BUN: 16 mg/dL (ref 6–20)
CALCIUM: 9.2 mg/dL (ref 8.9–10.3)
CO2: 26 mmol/L (ref 22–32)
Chloride: 100 mmol/L — ABNORMAL LOW (ref 101–111)
Creatinine, Ser: 1.01 mg/dL — ABNORMAL HIGH (ref 0.44–1.00)
GFR calc Af Amer: 57 mL/min — ABNORMAL LOW (ref >60)
GFR calc non Af Amer: 49 mL/min — ABNORMAL LOW (ref >60)
GLUCOSE: 107 mg/dL — AB (ref 65–99)
Potassium: 4.4 mmol/L (ref 3.5–5.1)
Sodium: 135 mmol/L (ref 135–145)
Total Protein: 7.3 g/dL (ref 6.5–8.1)

## 2016-05-02 LAB — CBC WITH DIFFERENTIAL/PLATELET
BASOS ABS: 0 10*3/uL (ref 0.0–0.1)
Basophils Relative: 0 %
Eosinophils Absolute: 0.3 10*3/uL (ref 0.0–0.7)
Eosinophils Relative: 3 %
HCT: 42.4 % (ref 36.0–46.0)
Hemoglobin: 13.4 g/dL (ref 12.0–15.0)
LYMPHS ABS: 1.2 10*3/uL (ref 0.7–4.0)
LYMPHS PCT: 11 %
MCH: 27.2 pg (ref 26.0–34.0)
MCHC: 31.6 g/dL (ref 30.0–36.0)
MCV: 86 fL (ref 78.0–100.0)
MONO ABS: 1.1 10*3/uL — AB (ref 0.1–1.0)
MONOS PCT: 10 %
NEUTROS ABS: 8.2 10*3/uL — AB (ref 1.7–7.7)
Neutrophils Relative %: 76 %
Platelets: 261 10*3/uL (ref 150–400)
RBC: 4.93 MIL/uL (ref 3.87–5.11)
RDW: 16.7 % — AB (ref 11.5–15.5)
WBC: 10.8 10*3/uL — ABNORMAL HIGH (ref 4.0–10.5)

## 2016-05-02 LAB — DIC (DISSEMINATED INTRAVASCULAR COAGULATION) PANEL
APTT: 40 s — AB (ref 24–36)
SMEAR REVIEW: NONE SEEN

## 2016-05-02 LAB — BRAIN NATRIURETIC PEPTIDE: B Natriuretic Peptide: 1211.1 pg/mL — ABNORMAL HIGH (ref 0.0–100.0)

## 2016-05-02 LAB — LACTIC ACID, PLASMA: LACTIC ACID, VENOUS: 2.1 mmol/L — AB (ref 0.5–1.9)

## 2016-05-02 LAB — DIC (DISSEMINATED INTRAVASCULAR COAGULATION)PANEL
D-Dimer, Quant: 3.99 ug/mL-FEU — ABNORMAL HIGH (ref 0.00–0.50)
Fibrinogen: 366 mg/dL (ref 210–475)
INR: 1.91
Platelets: 198 10*3/uL (ref 150–400)
Prothrombin Time: 22.2 seconds — ABNORMAL HIGH (ref 11.4–15.2)

## 2016-05-02 MED ORDER — ACETAMINOPHEN 500 MG PO TABS: 500.0000 mg | ORAL_TABLET | ORAL | Status: DC | PRN

## 2016-05-02 MED ORDER — ONDANSETRON HCL 4 MG/2ML IJ SOLN: 4.0000 mg | Freq: Four times a day (QID) | INTRAMUSCULAR | Status: DC | PRN

## 2016-05-02 MED ORDER — RIVASTIGMINE 4.6 MG/24HR TD PT24
4.6000 mg | MEDICATED_PATCH | Freq: Every day | TRANSDERMAL | Status: DC
  Administered 2016-05-03 – 2016-05-06 (×4): 4.6 mg via TRANSDERMAL
  Filled 2016-05-02 (×4): qty 1

## 2016-05-02 MED ORDER — ASPIRIN EC 81 MG PO TBEC
81.0000 mg | DELAYED_RELEASE_TABLET | Freq: Every day | ORAL | Status: DC
  Administered 2016-05-03 – 2016-05-06 (×4): 81 mg via ORAL
  Filled 2016-05-02 (×4): qty 1

## 2016-05-02 MED ORDER — PAROXETINE HCL 20 MG PO TABS
30.0000 mg | ORAL_TABLET | Freq: Every day | ORAL | Status: DC
  Administered 2016-05-03 – 2016-05-06 (×4): 30 mg via ORAL
  Filled 2016-05-02: qty 1
  Filled 2016-05-02: qty 3
  Filled 2016-05-02 (×2): qty 1
  Filled 2016-05-02 (×2): qty 3

## 2016-05-02 MED ORDER — CLINDAMYCIN PHOSPHATE 900 MG/50ML IV SOLN
900.0000 mg | Freq: Once | INTRAVENOUS | Status: AC
  Administered 2016-05-02: 900 mg via INTRAVENOUS
  Filled 2016-05-02: qty 50

## 2016-05-02 MED ORDER — SODIUM CHLORIDE 0.9 % IV SOLN: 250.0000 mL | INTRAVENOUS | Status: DC | PRN

## 2016-05-02 MED ORDER — LEVOTHYROXINE SODIUM 75 MCG PO TABS
37.5000 ug | ORAL_TABLET | Freq: Every day | ORAL | Status: DC
  Administered 2016-05-03 – 2016-05-06 (×4): 37.5 ug via ORAL
  Filled 2016-05-02 (×4): qty 1

## 2016-05-02 MED ORDER — SIMVASTATIN 10 MG PO TABS
10.0000 mg | ORAL_TABLET | Freq: Every day | ORAL | Status: DC
  Administered 2016-05-03 – 2016-05-05 (×3): 10 mg via ORAL
  Filled 2016-05-02 (×3): qty 1

## 2016-05-02 MED ORDER — SENNA 8.6 MG PO TABS
1.0000 | ORAL_TABLET | Freq: Every day | ORAL | Status: DC
  Administered 2016-05-03 – 2016-05-05 (×3): 8.6 mg via ORAL
  Filled 2016-05-02 (×3): qty 1

## 2016-05-02 MED ORDER — FUROSEMIDE 10 MG/ML IJ SOLN
40.0000 mg | Freq: Once | INTRAMUSCULAR | Status: AC
  Administered 2016-05-02: 40 mg via INTRAVENOUS
  Filled 2016-05-02: qty 4

## 2016-05-02 MED ORDER — GUAIFENESIN 100 MG/5ML PO SYRP
200.0000 mg | ORAL_SOLUTION | Freq: Four times a day (QID) | ORAL | Status: DC | PRN
  Filled 2016-05-02: qty 10

## 2016-05-02 MED ORDER — FUROSEMIDE 20 MG PO TABS
20.0000 mg | ORAL_TABLET | Freq: Every day | ORAL | Status: DC
  Administered 2016-05-03 – 2016-05-06 (×4): 20 mg via ORAL
  Filled 2016-05-02 (×4): qty 1

## 2016-05-02 MED ORDER — RANOLAZINE ER 500 MG PO TB12
500.0000 mg | ORAL_TABLET | Freq: Two times a day (BID) | ORAL | Status: DC
  Administered 2016-05-03 – 2016-05-06 (×8): 500 mg via ORAL
  Filled 2016-05-02 (×8): qty 1

## 2016-05-02 MED ORDER — IPRATROPIUM-ALBUTEROL 20-100 MCG/ACT IN AERS: 1.0000 | INHALATION_SPRAY | Freq: Four times a day (QID) | RESPIRATORY_TRACT | Status: DC | PRN

## 2016-05-02 MED ORDER — FAMOTIDINE 20 MG PO TABS
20.0000 mg | ORAL_TABLET | Freq: Every day | ORAL | Status: DC
  Administered 2016-05-03 – 2016-05-05 (×4): 20 mg via ORAL
  Filled 2016-05-02 (×4): qty 1

## 2016-05-02 MED ORDER — DOXYCYCLINE HYCLATE 100 MG PO TABS
100.0000 mg | ORAL_TABLET | Freq: Two times a day (BID) | ORAL | Status: DC
  Administered 2016-05-02 – 2016-05-06 (×8): 100 mg via ORAL
  Filled 2016-05-02 (×7): qty 1

## 2016-05-02 MED ORDER — BUSPIRONE HCL 15 MG PO TABS
15.0000 mg | ORAL_TABLET | Freq: Two times a day (BID) | ORAL | Status: DC
  Administered 2016-05-03 – 2016-05-06 (×7): 15 mg via ORAL
  Filled 2016-05-02: qty 1.5
  Filled 2016-05-02: qty 1
  Filled 2016-05-02: qty 1.5
  Filled 2016-05-02: qty 1
  Filled 2016-05-02 (×4): qty 1.5

## 2016-05-02 MED ORDER — OMEGA-3-ACID ETHYL ESTERS 1 G PO CAPS
2.0000 g | ORAL_CAPSULE | Freq: Every day | ORAL | Status: DC
  Administered 2016-05-03 – 2016-05-05 (×3): 2 g via ORAL
  Filled 2016-05-02 (×3): qty 2

## 2016-05-02 MED ORDER — CALCIUM CARBONATE-VITAMIN D 500-200 MG-UNIT PO TABS
1.0000 | ORAL_TABLET | Freq: Two times a day (BID) | ORAL | Status: DC
  Administered 2016-05-03 – 2016-05-06 (×7): 1 via ORAL
  Filled 2016-05-02 (×7): qty 1

## 2016-05-02 MED ORDER — MAGNESIUM HYDROXIDE 400 MG/5ML PO SUSP: 30.0000 mL | Freq: Every evening | ORAL | Status: DC | PRN

## 2016-05-02 MED ORDER — SODIUM CHLORIDE 0.9% FLUSH
3.0000 mL | INTRAVENOUS | Status: DC | PRN
  Administered 2016-05-05: 3 mL via INTRAVENOUS
  Filled 2016-05-02: qty 3

## 2016-05-02 MED ORDER — ALUM & MAG HYDROXIDE-SIMETH 200-200-20 MG/5ML PO SUSP: 30.0000 mL | Freq: Four times a day (QID) | ORAL | Status: DC | PRN

## 2016-05-02 MED ORDER — POLYVINYL ALCOHOL 1.4 % OP SOLN
1.0000 [drp] | Freq: Four times a day (QID) | OPHTHALMIC | Status: DC
  Administered 2016-05-03 – 2016-05-06 (×12): 1 [drp] via OPHTHALMIC
  Filled 2016-05-02: qty 15

## 2016-05-02 MED ORDER — HYDROCODONE-ACETAMINOPHEN 5-325 MG PO TABS
1.0000 | ORAL_TABLET | Freq: Four times a day (QID) | ORAL | Status: DC | PRN
  Administered 2016-05-04: 1 via ORAL
  Filled 2016-05-02 (×2): qty 1

## 2016-05-02 MED ORDER — IPRATROPIUM-ALBUTEROL 0.5-2.5 (3) MG/3ML IN SOLN: 3.0000 mL | Freq: Four times a day (QID) | RESPIRATORY_TRACT | Status: DC | PRN

## 2016-05-02 MED ORDER — TRAZODONE HCL 150 MG PO TABS
75.0000 mg | ORAL_TABLET | Freq: Every day | ORAL | Status: DC
  Administered 2016-05-03 – 2016-05-05 (×4): 75 mg via ORAL
  Filled 2016-05-02 (×3): qty 2
  Filled 2016-05-02: qty 1

## 2016-05-02 MED ORDER — SODIUM CHLORIDE 0.9% FLUSH
3.0000 mL | Freq: Two times a day (BID) | INTRAVENOUS | Status: DC
  Administered 2016-05-03 – 2016-05-06 (×4): 3 mL via INTRAVENOUS

## 2016-05-02 MED ORDER — ONDANSETRON HCL 4 MG PO TABS: 4.0000 mg | ORAL_TABLET | Freq: Four times a day (QID) | ORAL | Status: DC | PRN

## 2016-05-02 MED ORDER — APIXABAN 2.5 MG PO TABS
2.5000 mg | ORAL_TABLET | Freq: Two times a day (BID) | ORAL | Status: DC
  Administered 2016-05-03 – 2016-05-06 (×8): 2.5 mg via ORAL
  Filled 2016-05-02 (×8): qty 1

## 2016-05-02 MED ORDER — SENNOSIDES 8.6 MG PO TABS: 1.0000 | ORAL_TABLET | Freq: Every day | ORAL | Status: DC

## 2016-05-02 MED ORDER — SODIUM CHLORIDE 0.9% FLUSH
3.0000 mL | Freq: Two times a day (BID) | INTRAVENOUS | Status: DC
  Administered 2016-05-04 – 2016-05-05 (×3): 3 mL via INTRAVENOUS

## 2016-05-02 MED ORDER — METOPROLOL SUCCINATE ER 25 MG PO TB24
25.0000 mg | ORAL_TABLET | Freq: Every day | ORAL | Status: DC
  Administered 2016-05-03 – 2016-05-05 (×4): 25 mg via ORAL
  Filled 2016-05-02 (×4): qty 1

## 2016-05-02 NOTE — ED Notes (Signed)
[facility] nurse is unable take report. Provided a call back number.

## 2016-05-02 NOTE — ED Provider Notes (Addendum)
[facility]-EMERGENCY DEPT Provider Note   CSN: [id number] Arrival date & time: 05/02/16  1454     History   Chief Complaint Chief Complaint  Patient presents with  . Leg Swelling    HPI [name] Short is a 82 y.o. female.  82 yo F with a chief complaint of bilateral lower extremity edema. This been going on just for the past couple days. Nursing home was concerned with her history of CHF and sent her here. Level V caveat dementia. Patient is unable to provide any further history. She just tells me that she has fallen multiple times.   The history is provided by the EMS personnel and the nursing home.  Illness  This is a new problem. The current episode started more than 1 week ago. The problem occurs constantly. The problem has not changed since onset.Pertinent negatives include no chest pain, no headaches and no shortness of breath. Nothing aggravates the symptoms. Nothing relieves the symptoms. She has tried nothing for the symptoms. The treatment provided no relief.    Past Medical History:  Diagnosis Date  . Anginal pain (HCC)    not in in last month  . Anxiety   . Arthritis    "hands" (10/03/2015)  . Atrial fibrillation (HCC)   . CAD (coronary artery disease) 03/01/2008   Lexiscan - EF 79, no ECG changes/EKG negative for ischemia, post-stress EF 86  . Cerebral atherosclerosis 10/15/2011   Extracranial Examination - mild-moderate amount smooth mixed density plaque elevating velocities within the proximal and mid segments of the internal carotid artery  . Chest pain 10/07/2009   2D Echo - EF 50-55, mitral valve calcified annulus  . CHF (congestive heart failure) (HCC)   . Chronic bronchitis (HCC)   . Claudication (HCC) 09/01/2010   LEA Doppler - Bilateral ABIs-demonstrated moderate arterial insufficiency to the lower extremities at rest, Right CIA/EIA-known occlusive disease, PV Cath  . Complication of anesthesia    "09/29/2015 put to sleep to take out wisdom tooth; took >12h to get  her awake" (10/03/2015  . Coronary artery disease   . Depression   . Dyspnea 06/01/2012   2D Echo - EF 60-65, mitral valve calcified annulus, left atrium severely dilated, right atrium moderately dilated  . GERD (gastroesophageal reflux disease)    takes prevacid  . High cholesterol   . Hypertension   . Hypothyroidism   . Memory loss   . Myocardial infarction   . PVD (peripheral vascular disease) (HCC)   . Stroke [facility]) ?10/02/2015  . Thyroid disease   . Type II diabetes mellitus (HCC)   . Vascular dementia     Patient Active Problem List   Diagnosis Date Noted  . Cellulitis 05/02/2016  . PAD (peripheral artery disease) (HCC) 05/02/2016  . Acute on chronic diastolic CHF (congestive heart failure) (HCC) 05/02/2016  . Ischemic foot 05/02/2016  . Acute CVA (cerebrovascular accident) (HCC) 10/03/2015  . Cerebellar stroke (HCC)   . Paroxysmal atrial fibrillation (HCC)   . HLD (hyperlipidemia)   . Stroke (HCC) 10/02/2015  . Acute embolic stroke (HCC) 10/02/2015  . Stroke (cerebrum) (HCC) 10/02/2015  . Acute respiratory failure with hypoxia (HCC) 10/02/2015  . Essential hypertension 03/12/2013  . Atherosclerosis of native arteries of the extremities with intermittent claudication 11/04/2012  . Preoperative clearance 09/25/2012  . CAD (coronary artery disease) of artery bypass graft 09/25/2012  . Peripheral vascular disease, unspecified 09/23/2012  . Aftercare following surgery of the circulatory system, NEC 09/23/2012  . Chest pain 08/11/2012  .  Chronic atrial fibrillation- ventricular rate is controlled 08/11/2012  . Dyslipidemia 06/03/2012  . PVD (peripheral vascular disease), AOBF '94 06/03/2012  . NSVT, EF 60-65% by 2D this admission, beta blocker increased 06/03/2012  . CAD, CABG X 2 '93- Last cath 05/2012- no change from 2011, medical Rx- EF of 60-65% via echo 05/2012 06/03/2012  . CHF (congestive heart failure) - mild, BNP 3K 05/31/2012  . Unstable angina (HCC) 05/31/2012  .  Diabetes mellitus type II, controlled (HCC) 05/31/2012  . Atrial fibrillation with controlled ventricular response (HCC) 05/31/2012    Past Surgical History:  Procedure Laterality Date  . ABDOMINAL HYSTERECTOMY    . ANGIOPLASTY Left 02/23/2010   Darcon patch, 80-90% stenosis proximal to the anastomosis  . AORTA - BILATERAL FEMORAL ARTERY BYPASS GRAFT  2011  . AXILLARY-FEMORAL BYPASS GRAFT Right 10/08/2012   Procedure: BYPASS GRAFT AXILLA-BIFEMORAL;  Surgeon: Pryor Ochoa, MD;  Location: [facility] OR;  Service: Vascular;  Laterality: Right;  . CARDIAC CATHETERIZATION  2012  . CARDIAC CATHETERIZATION  06/02/2012   Unchanged anatomy compared to 3 years ago, continue medical therapy  . CARDIAC CATHETERIZATION  10/06/2009   Occluded right coronary artery, left-to-right collateral noncritical left disease with occluded grafts  . CATARACT EXTRACTION W/ INTRAOCULAR LENS  IMPLANT, BILATERAL Bilateral   . CORONARY ANGIOPLASTY    . CORONARY ARTERY BYPASS GRAFT  1993  . EYE SURGERY    . JOINT REPLACEMENT    . LEFT HEART CATHETERIZATION WITH CORONARY/GRAFT ANGIOGRAM N/A 06/02/2012   Procedure: LEFT HEART CATHETERIZATION WITH Isabel Caprice;  Surgeon: Runell Gess, MD;  Location: [facility] CATH LAB;  Service: Cardiovascular;  Laterality: N/A;  . TOTAL KNEE ARTHROPLASTY Left 2010    OB History    No data available       Home Medications    Prior to Admission medications   Medication Sig Start Date End Date Taking? Authorizing Provider  acetaminophen (TYLENOL) 325 MG tablet Take 650 mg by mouth 4 (four) times daily.    Yes Historical Provider, MD  acetaminophen (TYLENOL) 500 MG tablet Take 500 mg by mouth every 4 (four) hours as needed for mild pain, moderate pain, fever or headache.   Yes Historical Provider, MD  alum & mag hydroxide-simeth (MINTOX) 200-200-20 MG/5ML suspension Take 30 mLs by mouth every 6 (six) hours as needed for indigestion or heartburn.   Yes Historical Provider, MD    amoxicillin (AMOXIL) 500 MG capsule Take 500 mg by mouth 2 (two) times daily.   Yes Historical Provider, MD  apixaban (ELIQUIS) 2.5 MG TABS tablet Take 1 tablet (2.5 mg total) by mouth 2 (two) times daily. 10/05/15  Yes Rhetta Mura, MD  aspirin EC 81 MG tablet Take 81 mg by mouth daily.   Yes Historical Provider, MD  busPIRone (BUSPAR) 15 MG tablet Take 15 mg by mouth 2 (two) times daily.   Yes Historical Provider, MD  calcium-vitamin D (OSCAL WITH D) 500-200 MG-UNIT tablet Take 1 tablet by mouth 2 (two) times daily.   Yes Historical Provider, MD  cetirizine (ZYRTEC) 10 MG tablet Take 10 mg by mouth daily.   Yes Historical Provider, MD  diphenhydrAMINE-zinc acetate (BENADRYL) cream Apply 1 application topically 2 (two) times daily.   Yes Historical Provider, MD  famotidine (PEPCID) 20 MG tablet Take 20 mg by mouth at bedtime.    Yes Historical Provider, MD  furosemide (LASIX) 20 MG tablet Take 20 mg by mouth daily.   Yes Historical Provider, MD  guaifenesin (ROBITUSSIN) 100  MG/5ML syrup Take 200 mg by mouth every 6 (six) hours as needed for cough.    Yes Historical Provider, MD  HYDROcodone-acetaminophen (NORCO/VICODIN) 5-325 MG tablet Take 0.5 to 1 tablet by mouth every 6 (six) hours as needed for severe pain. 03/20/16  Yes Jaime Pilcher Ward, PA-C  Ipratropium-Albuterol (COMBIVENT RESPIMAT) 20-100 MCG/ACT AERS respimat Inhale 1 puff into the lungs every 6 (six) hours as needed for wheezing or shortness of breath.    Yes Historical Provider, MD  levothyroxine (SYNTHROID, LEVOTHROID) 25 MCG tablet Take 37.5 mcg by mouth daily before breakfast.   Yes Historical Provider, MD  loperamide (IMODIUM) 2 MG capsule Take 2 mg by mouth as needed for diarrhea or loose stools.    Yes Historical Provider, MD  losartan (COZAAR) 100 MG tablet Take 100 mg by mouth daily.   Yes Historical Provider, MD  magnesium hydroxide (MILK OF MAGNESIA) 400 MG/5ML suspension Take 30 mLs by mouth at bedtime as needed for  mild constipation.    Yes Historical Provider, MD  metoprolol succinate (TOPROL-XL) 25 MG 24 hr tablet Take 25 mg by mouth at bedtime.   Yes Historical Provider, MD  Neomycin-Bacitracin-Polymyxin (TRIPLE ANTIBIOTIC) [phone] OINT Apply 1 application topically as needed (for minor skin tears/abrasions).   Yes Historical Provider, MD  nitroGLYCERIN (NITROSTAT) 0.4 MG SL tablet Place 0.4 mg under the tongue every 5 (five) minutes as needed for chest pain.    Yes Historical Provider, MD  omega-3 acid ethyl esters (LOVAZA) 1 G capsule Take 2 g by mouth daily.   Yes Historical Provider, MD  PARoxetine (PAXIL) 30 MG tablet Take 30 mg by mouth daily.   Yes Historical Provider, MD  polyvinyl alcohol (LIQUIFILM TEARS) 1.4 % ophthalmic solution Place 1 drop into both eyes 4 (four) times daily.    Yes Historical Provider, MD  potassium chloride SA (K-DUR,KLOR-CON) 20 MEQ tablet Take 20 mEq by mouth daily.   Yes Historical Provider, MD  ranolazine (RANEXA) 500 MG 12 hr tablet Take 1 tablet (500 mg total) by mouth 2 (two) times daily. 08/12/12  Yes Brittainy Sherlynn Carbon, PA-C  rivastigmine (EXELON) 4.6 mg/24hr Place 4.6 mg onto the skin daily.   Yes Historical Provider, MD  senna (SENOKOT) 8.6 MG tablet Take 1 tablet by mouth at bedtime.   Yes Historical Provider, MD  simvastatin (ZOCOR) 10 MG tablet Take 10 mg by mouth at bedtime.    Yes Historical Provider, MD  traZODone (DESYREL) 50 MG tablet Take 75 mg by mouth at bedtime.   Yes Historical Provider, MD  albuterol (PROVENTIL HFA;VENTOLIN HFA) 108 (90 BASE) MCG/ACT inhaler Inhale 1-2 puffs into the lungs every 6 (six) hours as needed for wheezing. Patient not taking: Reported on 04/29/2016 12/31/12   Marlon Pel, PA-C  UNABLE TO FIND Take 1 each by mouth 3 (three) times daily. Med Name: Mighty Shakes    Historical Provider, MD    Family History Family History  Problem Relation Age of Onset  . Heart disease Father   . Hypertension Father   . Heart attack  Father   . Kidney failure Brother   . Hyperlipidemia Daughter     Social History Social History  Substance Use Topics  . Smoking status: Former Smoker    Packs/day: 1.00    Years: 50.00    Types: Cigarettes    Quit date: 05/15/1991  . Smokeless tobacco: Never Used  . Alcohol use No     Allergies   Codeine   Review of  Systems Review of Systems  Constitutional: Negative for chills and fever.  HENT: Negative for congestion and rhinorrhea.   Eyes: Negative for redness and visual disturbance.  Respiratory: Negative for shortness of breath and wheezing.   Cardiovascular: Positive for leg swelling. Negative for chest pain and palpitations.  Gastrointestinal: Negative for nausea and vomiting.  Genitourinary: Negative for dysuria and urgency.  Musculoskeletal: Negative for arthralgias and myalgias.  Skin: Negative for pallor and wound.  Neurological: Negative for dizziness and headaches.     Physical Exam Updated Vital Signs BP (!) 123/103   Pulse 72   Temp 98.1 F (36.7 C) (Oral)   Resp 21   SpO2 (!) 85%   Physical Exam  Constitutional: She appears well-developed and well-nourished. No distress.  HENT:  Head: Normocephalic.  Bruising to bilateral cheeks  Eyes: EOM are normal. Pupils are equal, round, and reactive to light.  Neck: Normal range of motion. Neck supple.  Cardiovascular: Normal rate and regular rhythm.  Exam reveals no gallop and no friction rub.   No murmur heard. The patient has cold feet bilaterally. No palpable or dopplerable pulses. Femorals are palpable bilaterally. The leg is warm down to the ankle bilaterally.  Pulmonary/Chest: Effort normal. She has no wheezes. She has no rales.  Abdominal: Soft. She exhibits no distension and no mass. There is no tenderness. There is no guarding.  Musculoskeletal: She exhibits no edema or tenderness.  Neurological: She is alert.  Pleasantly confused to situation  Skin: Skin is warm and dry. She is not  diaphoretic. There is erythema.  Bilateral lower extremity. Pitting. Patient has some serous drainage to bilateral feet. She has multiple bruises noted across her upper or lower extremities. She has a open wound to the right tibia or purulent drainage. The surrounding area has some erythema and warmth.  I am able to move all four extremities without difficulty. No noted tenderness expressed by the patient.  Psychiatric: She has a normal mood and affect. Her behavior is normal.  Nursing note and vitals reviewed.    ED Treatments / Results  Labs (all labs ordered are listed, but only abnormal results are displayed) Labs Reviewed  CBC WITH DIFFERENTIAL/PLATELET - Abnormal; Notable for the following:       Result Value   WBC 10.8 (*)    RDW 16.7 (*)    Neutro Abs 8.2 (*)    Monocytes Absolute 1.1 (*)    All other components within normal limits  BRAIN NATRIURETIC PEPTIDE - Abnormal; Notable for the following:    B Natriuretic Peptide 1,211.1 (*)    All other components within normal limits  COMPREHENSIVE METABOLIC PANEL - Abnormal; Notable for the following:    Chloride 100 (*)    Glucose, Bld 107 (*)    Creatinine, Ser 1.01 (*)    Alkaline Phosphatase 136 (*)    Total Bilirubin 1.7 (*)    GFR calc non Af Amer 49 (*)    GFR calc Af Amer 57 (*)    All other components within normal limits  DIC (DISSEMINATED INTRAVASCULAR COAGULATION) PANEL - Abnormal; Notable for the following:    Prothrombin Time 22.2 (*)    aPTT 40 (*)    D-Dimer, Quant 3.99 (*)    All other components within normal limits  BASIC METABOLIC PANEL  LACTIC ACID, PLASMA    EKG  EKG Interpretation  Date/Time:  Wednesday May 02 2016 16:08:27 EST Ventricular Rate:  97 PR Interval:    QRS Duration: 95  QT Interval:  365 QTC Calculation: 464 R Axis:   121 Text Interpretation:  Atrial fibrillation Right axis deviation Borderline repolarization abnormality No significant change since last tracing Confirmed by  Novelle Addair MD, DANIEL [phone]([id number]) on 05/02/2016 5:49:39 PM       Radiology Dg Chest 2 View  Result Date: 05/02/2016 CLINICAL DATA:  Leg swelling EXAM: CHEST  2 VIEW COMPARISON:  04/29/2016 FINDINGS: Cardiomegaly is noted. Status post median sternotomy. Mild interstitial prominence bilaterally suspicious for mild interstitial edema. Trace bilateral pleural effusion with basilar atelectasis. No segmental infiltrate. Thoracic spine osteopenia. IMPRESSION: Cardiomegaly. Mild interstitial prominence bilateral suspicious for mild interstitial edema. No segmental infiltrate. Trace bilateral pleural effusion with bilateral basilar atelectasis. Electronically Signed   By: Natasha MeadLiviu  Pop M.D.   On: 05/02/2016 17:20   Dg Tibia/fibula Right  Result Date: 05/02/2016 CLINICAL DATA:  Fall with leg swelling and purulent drainage. EXAM: RIGHT TIBIA AND FIBULA - 2 VIEW COMPARISON:  None. FINDINGS: Nonspecific soft tissue swelling about the leg. There are changes of venous harvesting in the calf. Total knee arthroplasty which is located. Osteopenia. No acute fracture or subluxation. IMPRESSION: Soft tissue swelling without acute osseous finding. Electronically Signed   By: Marnee SpringJonathon  Watts M.D.   On: 05/02/2016 17:22   Ct Head Wo Contrast  Result Date: 05/02/2016 CLINICAL DATA:  Multiple recent falls. EXAM: CT HEAD WITHOUT CONTRAST TECHNIQUE: Contiguous axial images were obtained from the base of the skull through the vertex without intravenous contrast. COMPARISON:  04/29/2016 FINDINGS: Brain: There is no evidence for acute hemorrhage, hydrocephalus, mass lesion, or abnormal extra-axial fluid collection. No definite CT evidence for acute infarction. Diffuse loss of parenchymal volume is consistent with atrophy. Patchy low attenuation in the deep hemispheric and periventricular white matter is nonspecific, but likely reflects chronic microvascular ischemic demyelination. Vascular: Atherosclerotic calcification is visualized in  the carotid arteries. No dense MCA sign. Major dural sinuses are unremarkable. Skull: No evidence for fracture. No worrisome lytic or sclerotic lesion. Sinuses/Orbits: The visualized paranasal sinuses and mastoid air cells are clear. Visualized portions of the globes and intraorbital fat are unremarkable. Other: Scalp contusion identified high right parietal region. IMPRESSION: 1. No acute intracranial abnormality. 2. Atrophy with chronic small vessel white matter ischemic disease. 3. Scalp contusion high right parietal region. Electronically Signed   By: Kennith CenterEric  Mansell M.D.   On: 05/02/2016 18:03    Procedures Procedures (including critical care time) Procedure note: Ultrasound Guided Peripheral IV Ultrasound guided peripheral 1.88 inch angiocath IV placement performed by me. Indications: Nursing unable to place IV. Details: The antecubital fossa and upper arm were evaluated with a multifrequency linear probe. Patent brachial veins were noted. 1 attempt was made to cannulate a vein under realtime US guidance with successful cannulation of the vein and catheter placement. There is return of non-pulsatile dark red blood. The patient tolerated the procedure well without complications. Images archived electronically.  CPT codes: 6045476937 and [id number]  Medications Ordered in ED Medications  busPIRone (BUSPAR) tablet 15 mg (not administered)  HYDROcodone-acetaminophen (NORCO/VICODIN) 5-325 MG per tablet 1-2 tablet (not administered)  acetaminophen (TYLENOL) tablet 500 mg (not administered)  alum & mag hydroxide-simeth (MAALOX/MYLANTA) 200-200-20 MG/5ML suspension 30 mL (not administered)  aspirin EC tablet 81 mg (not administered)  rivastigmine (EXELON) 4.6 mg/24hr 4.6 mg (not administered)  guaifenesin (ROBITUSSIN) 100 MG/5ML syrup 200 mg (not administered)  magnesium hydroxide (MILK OF MAGNESIA) suspension 30 mL (not administered)  simvastatin (ZOCOR) tablet 10 mg (not administered)  apixaban (ELIQUIS)  tablet  2.5 mg (not administered)  calcium-vitamin D (OSCAL WITH D) 500-200 MG-UNIT per tablet 1 tablet (not administered)  famotidine (PEPCID) tablet 20 mg (not administered)  furosemide (LASIX) tablet 20 mg (not administered)  Ipratropium-Albuterol (COMBIVENT) respimat 1 puff (not administered)  levothyroxine (SYNTHROID, LEVOTHROID) tablet 37.5 mcg (not administered)  metoprolol succinate (TOPROL-XL) 24 hr tablet 25 mg (not administered)  PARoxetine (PAXIL) tablet 30 mg (not administered)  traZODone (DESYREL) tablet 75 mg (not administered)  polyvinyl alcohol (LIQUIFILM TEARS) 1.4 % ophthalmic solution 1 drop (not administered)  senna (SENOKOT) tablet 8.6 mg (not administered)  ranolazine (RANEXA) 12 hr tablet 500 mg (not administered)  omega-3 acid ethyl esters (LOVAZA) capsule 2 g (not administered)  doxycycline (VIBRA-TABS) tablet 100 mg (not administered)  sodium chloride flush (NS) 0.9 % injection 3 mL (not administered)  sodium chloride flush (NS) 0.9 % injection 3 mL (not administered)  sodium chloride flush (NS) 0.9 % injection 3 mL (not administered)  0.9 %  sodium chloride infusion (not administered)  ondansetron (ZOFRAN) tablet 4 mg (not administered)    Or  ondansetron (ZOFRAN) injection 4 mg (not administered)  clindamycin (CLEOCIN) IVPB 900 mg (0 mg Intravenous Stopped 05/02/16 1803)  furosemide (LASIX) injection 40 mg (40 mg Intravenous Given 05/02/16 1847)     Initial Impression / Assessment and Plan / ED Course  I have reviewed the triage vital signs and the nursing notes.  Pertinent labs & imaging results that were available during my care of the patient were reviewed by me and considered in my medical decision making (see chart for details).  Clinical Course     82 yo F With multiple falls over the past month or so. Here for lower extremity edema. Patient's initial exam concerning for a cold and pulseless bilateral lower extremities. I discussed this with Dr.  early, vascular surgery. We are unsure of the acuity of this complaint. Patient is unable to provide further history. Will admit to [facility] for vascular surgery evaluation. Patient otherwise clinically has cellulitis with purulent drainage to the right lower extremity. Start on clindamycin.  The patients results and plan were reviewed and discussed.   Any x-rays performed were independently reviewed by myself.   Differential diagnosis were considered with the presenting HPI.  Medications  busPIRone (BUSPAR) tablet 15 mg (not administered)  HYDROcodone-acetaminophen (NORCO/VICODIN) 5-325 MG per tablet 1-2 tablet (not administered)  acetaminophen (TYLENOL) tablet 500 mg (not administered)  alum & mag hydroxide-simeth (MAALOX/MYLANTA) 200-200-20 MG/5ML suspension 30 mL (not administered)  aspirin EC tablet 81 mg (not administered)  rivastigmine (EXELON) 4.6 mg/24hr 4.6 mg (not administered)  guaifenesin (ROBITUSSIN) 100 MG/5ML syrup 200 mg (not administered)  magnesium hydroxide (MILK OF MAGNESIA) suspension 30 mL (not administered)  simvastatin (ZOCOR) tablet 10 mg (not administered)  apixaban (ELIQUIS) tablet 2.5 mg (not administered)  calcium-vitamin D (OSCAL WITH D) 500-200 MG-UNIT per tablet 1 tablet (not administered)  famotidine (PEPCID) tablet 20 mg (not administered)  furosemide (LASIX) tablet 20 mg (not administered)  Ipratropium-Albuterol (COMBIVENT) respimat 1 puff (not administered)  levothyroxine (SYNTHROID, LEVOTHROID) tablet 37.5 mcg (not administered)  metoprolol succinate (TOPROL-XL) 24 hr tablet 25 mg (not administered)  PARoxetine (PAXIL) tablet 30 mg (not administered)  traZODone (DESYREL) tablet 75 mg (not administered)  polyvinyl alcohol (LIQUIFILM TEARS) 1.4 % ophthalmic solution 1 drop (not administered)  senna (SENOKOT) tablet 8.6 mg (not administered)  ranolazine (RANEXA) 12 hr tablet 500 mg (not administered)  omega-3 acid ethyl esters (LOVAZA) capsule 2 g (not  administered)  doxycycline (VIBRA-TABS) tablet 100 mg (not administered)  sodium chloride flush (NS) 0.9 % injection 3 mL (not administered)  sodium chloride flush (NS) 0.9 % injection 3 mL (not administered)  sodium chloride flush (NS) 0.9 % injection 3 mL (not administered)  0.9 %  sodium chloride infusion (not administered)  ondansetron (ZOFRAN) tablet 4 mg (not administered)    Or  ondansetron (ZOFRAN) injection 4 mg (not administered)  clindamycin (CLEOCIN) IVPB 900 mg (0 mg Intravenous Stopped 05/02/16 1803)  furosemide (LASIX) injection 40 mg (40 mg Intravenous Given 05/02/16 1847)    Vitals:   05/02/16 1719 05/02/16 1849 05/02/16 1900 05/02/16 1930  BP: 117/82 107/91 121/82 (!) 123/103  Pulse: 108 96 72   Resp: 15 16 16 21   Temp:  98.1 F (36.7 C)    TempSrc:  Oral    SpO2: 96% 96% (!) 85%     Final diagnoses:  Cellulitis of right lower extremity  Leg edema    Admission/ observation were discussed with the admitting physician, patient and/or family and they are comfortable with the plan.    Final Clinical Impressions(s) / ED Diagnoses   Final diagnoses:  Cellulitis of right lower extremity  Leg edema    New Prescriptions New Prescriptions   No medications on file     Melene Plan, DO 05/02/16 2114    Melene Plan, DO 05/02/16 2122

## 2016-05-02 NOTE — H&P (Signed)
History and Physical    [name] Short ZOX:[id number] DOB: 04/16/1928 DOA: 05/02/2016  PCP: MAZZOCCHI, Rise Mu, MD    Patient coming from: SNF  Chief Complaint: Leg swelling, pain   HPI: [name] Short is a 82 y.o. female with medical history significant for vascular dementia, coronary artery disease s/p CABG, PAD status-post bifemoral bypass, chronic diastolic CHF, and atrial fibrillation on Eliquis, now presenting from her SNF for evaluation of bilateral lower extremity edema and foot pain. History is obtained in part through the patient, though this is limited by her dementia. Additional history is obtained through discussion with the ED personnel, SNF personnel, and review of the EMR. Patient reportedly developed bilateral lower extremity edema the day prior to her presentation. The patient reports that the feet were quite painful previously, but there is no pain upon presentation. She has known peripheral arterial disease and underwent bifemoral bypass graft in 2014. There is been no fevers at the SNF and her vitals have been stable. History is also notable for recurrent falls over the past month with scattered bruising and abrasions.  ED Course: Upon arrival to the ED, patient is found to be afebrile, saturating adequately on room air, and with vital signs stable. EKG reveals atrial fibrillation with right axis deviation and chest x-ray is notable for cardiomegaly with mild interstitial edema and trace bilateral effusions. Chemistry panel features a stable serum creatinine and stable mild elevation in total bilirubin. CBC is notable for mild leukocytosis to 10,800. BNP is elevated to a value of 1211. Noncontrast head CT was obtained and negative for acute intracranial abnormality. Radiographs of the right lower legare notable for soft tissue swelling, but no acute osseous abnormality. Patient's bilateral feet were noted to be cold and pulses could not be identified with palpation or with Doppler. Vascular  surgery was consulted by the ED physician and advised a medical admission to [facility] for further evaluation and management. Patient was treated with 40 mg of IV Lasix and IV clindamycin in the emergency department. She remained hemodynamically stable and in no apparent respiratory distress. She'll be observed on the telemetry unit [facility] for ongoing evaluation and management of PAD with cool and pulseless feet bilaterally, as well as acute exacerbation and diastolic CHF and cellulitis involving the right shin.  Review of Systems:  Unable to obtain ROS secondary to patient's clinical condition with dementia.  Past Medical History:  Diagnosis Date  . Anginal pain (HCC)    not in in last month  . Anxiety   . Arthritis    "hands" (10/03/2015)  . Atrial fibrillation (HCC)   . CAD (coronary artery disease) 03/01/2008   Lexiscan - EF 79, no ECG changes/EKG negative for ischemia, post-stress EF 86  . Cerebral atherosclerosis 10/15/2011   Extracranial Examination - mild-moderate amount smooth mixed density plaque elevating velocities within the proximal and mid segments of the internal carotid artery  . Chest pain 10/07/2009   2D Echo - EF 50-55, mitral valve calcified annulus  . CHF (congestive heart failure) (HCC)   . Chronic bronchitis (HCC)   . Claudication (HCC) 09/01/2010   LEA Doppler - Bilateral ABIs-demonstrated moderate arterial insufficiency to the lower extremities at rest, Right CIA/EIA-known occlusive disease, PV Cath  . Complication of anesthesia    "09/29/2015 put to sleep to take out wisdom tooth; took >12h to get her awake" (10/03/2015  . Coronary artery disease   . Depression   . Dyspnea 06/01/2012   2D Echo - EF  60-65, mitral valve calcified annulus, left atrium severely dilated, right atrium moderately dilated  . GERD (gastroesophageal reflux disease)    takes prevacid  . High cholesterol   . Hypertension   . Hypothyroidism   . Memory loss   .  Myocardial infarction   . PVD (peripheral vascular disease) (HCC)   . Stroke [facility]) ?10/02/2015  . Thyroid disease   . Type II diabetes mellitus (HCC)   . Vascular dementia     Past Surgical History:  Procedure Laterality Date  . ABDOMINAL HYSTERECTOMY    . ANGIOPLASTY Left 02/23/2010   Darcon patch, 80-90% stenosis proximal to the anastomosis  . AORTA - BILATERAL FEMORAL ARTERY BYPASS GRAFT  2011  . AXILLARY-FEMORAL BYPASS GRAFT Right 10/08/2012   Procedure: BYPASS GRAFT AXILLA-BIFEMORAL;  Surgeon: Pryor Ochoa, MD;  Location: [facility] OR;  Service: Vascular;  Laterality: Right;  . CARDIAC CATHETERIZATION  2012  . CARDIAC CATHETERIZATION  06/02/2012   Unchanged anatomy compared to 3 years ago, continue medical therapy  . CARDIAC CATHETERIZATION  10/06/2009   Occluded right coronary artery, left-to-right collateral noncritical left disease with occluded grafts  . CATARACT EXTRACTION W/ INTRAOCULAR LENS  IMPLANT, BILATERAL Bilateral   . CORONARY ANGIOPLASTY    . CORONARY ARTERY BYPASS GRAFT  1993  . EYE SURGERY    . JOINT REPLACEMENT    . LEFT HEART CATHETERIZATION WITH CORONARY/GRAFT ANGIOGRAM N/A 06/02/2012   Procedure: LEFT HEART CATHETERIZATION WITH Isabel Caprice;  Surgeon: Runell Gess, MD;  Location: [facility] CATH LAB;  Service: Cardiovascular;  Laterality: N/A;  . TOTAL KNEE ARTHROPLASTY Left 2010     reports that she quit smoking about 24 years ago. Her smoking use included Cigarettes. She has a 50.00 pack-year smoking history. She has never used smokeless tobacco. She reports that she does not drink alcohol or use drugs.  Allergies  Allergen Reactions  . Codeine Itching and Nausea And Vomiting    Family History  Problem Relation Age of Onset  . Heart disease Father   . Hypertension Father   . Heart attack Father   . Kidney failure Brother   . Hyperlipidemia Daughter      Prior to Admission medications   Medication Sig Start Date End Date Taking? Authorizing  Provider  acetaminophen (TYLENOL) 325 MG tablet Take 650 mg by mouth 4 (four) times daily.    Yes Historical Provider, MD  acetaminophen (TYLENOL) 500 MG tablet Take 500 mg by mouth every 4 (four) hours as needed for mild pain, moderate pain, fever or headache.   Yes Historical Provider, MD  alum & mag hydroxide-simeth (MINTOX) 200-200-20 MG/5ML suspension Take 30 mLs by mouth every 6 (six) hours as needed for indigestion or heartburn.   Yes Historical Provider, MD  amoxicillin (AMOXIL) 500 MG capsule Take 500 mg by mouth 2 (two) times daily.   Yes Historical Provider, MD  apixaban (ELIQUIS) 2.5 MG TABS tablet Take 1 tablet (2.5 mg total) by mouth 2 (two) times daily. 10/05/15  Yes Rhetta Mura, MD  aspirin EC 81 MG tablet Take 81 mg by mouth daily.   Yes Historical Provider, MD  busPIRone (BUSPAR) 15 MG tablet Take 15 mg by mouth 2 (two) times daily.   Yes Historical Provider, MD  calcium-vitamin D (OSCAL WITH D) 500-200 MG-UNIT tablet Take 1 tablet by mouth 2 (two) times daily.   Yes Historical Provider, MD  cetirizine (ZYRTEC) 10 MG tablet Take 10 mg by mouth daily.   Yes Historical Provider, MD  diphenhydrAMINE-zinc acetate (BENADRYL) cream Apply 1 application topically 2 (two) times daily.   Yes Historical Provider, MD  famotidine (PEPCID) 20 MG tablet Take 20 mg by mouth at bedtime.    Yes Historical Provider, MD  furosemide (LASIX) 20 MG tablet Take 20 mg by mouth daily.   Yes Historical Provider, MD  guaifenesin (ROBITUSSIN) 100 MG/5ML syrup Take 200 mg by mouth every 6 (six) hours as needed for cough.    Yes Historical Provider, MD  HYDROcodone-acetaminophen (NORCO/VICODIN) 5-325 MG tablet Take 0.5 to 1 tablet by mouth every 6 (six) hours as needed for severe pain. 03/20/16  Yes Jaime Pilcher Ward, PA-C  Ipratropium-Albuterol (COMBIVENT RESPIMAT) 20-100 MCG/ACT AERS respimat Inhale 1 puff into the lungs every 6 (six) hours as needed for wheezing or shortness of breath.    Yes Historical  Provider, MD  levothyroxine (SYNTHROID, LEVOTHROID) 25 MCG tablet Take 37.5 mcg by mouth daily before breakfast.   Yes Historical Provider, MD  loperamide (IMODIUM) 2 MG capsule Take 2 mg by mouth as needed for diarrhea or loose stools.    Yes Historical Provider, MD  losartan (COZAAR) 100 MG tablet Take 100 mg by mouth daily.   Yes Historical Provider, MD  magnesium hydroxide (MILK OF MAGNESIA) 400 MG/5ML suspension Take 30 mLs by mouth at bedtime as needed for mild constipation.    Yes Historical Provider, MD  metoprolol succinate (TOPROL-XL) 25 MG 24 hr tablet Take 25 mg by mouth at bedtime.   Yes Historical Provider, MD  Neomycin-Bacitracin-Polymyxin (TRIPLE ANTIBIOTIC) [phone] OINT Apply 1 application topically as needed (for minor skin tears/abrasions).   Yes Historical Provider, MD  nitroGLYCERIN (NITROSTAT) 0.4 MG SL tablet Place 0.4 mg under the tongue every 5 (five) minutes as needed for chest pain.    Yes Historical Provider, MD  omega-3 acid ethyl esters (LOVAZA) 1 G capsule Take 2 g by mouth daily.   Yes Historical Provider, MD  PARoxetine (PAXIL) 30 MG tablet Take 30 mg by mouth daily.   Yes Historical Provider, MD  polyvinyl alcohol (LIQUIFILM TEARS) 1.4 % ophthalmic solution Place 1 drop into both eyes 4 (four) times daily.    Yes Historical Provider, MD  potassium chloride SA (K-DUR,KLOR-CON) 20 MEQ tablet Take 20 mEq by mouth daily.   Yes Historical Provider, MD  ranolazine (RANEXA) 500 MG 12 hr tablet Take 1 tablet (500 mg total) by mouth 2 (two) times daily. 08/12/12  Yes Brittainy Sherlynn Carbon, PA-C  rivastigmine (EXELON) 4.6 mg/24hr Place 4.6 mg onto the skin daily.   Yes Historical Provider, MD  senna (SENOKOT) 8.6 MG tablet Take 1 tablet by mouth at bedtime.   Yes Historical Provider, MD  simvastatin (ZOCOR) 10 MG tablet Take 10 mg by mouth at bedtime.    Yes Historical Provider, MD  traZODone (DESYREL) 50 MG tablet Take 75 mg by mouth at bedtime.   Yes Historical Provider, MD   albuterol (PROVENTIL HFA;VENTOLIN HFA) 108 (90 BASE) MCG/ACT inhaler Inhale 1-2 puffs into the lungs every 6 (six) hours as needed for wheezing. Patient not taking: Reported on 04/29/2016 12/31/12   Marlon Pel, PA-C  UNABLE TO FIND Take 1 each by mouth 3 (three) times daily. Med Name: Wyline Mood    Historical Provider, MD    Physical Exam: Vitals:   05/02/16 1719 05/02/16 1849 05/02/16 1900 05/02/16 1930  BP: 117/82 107/91 121/82 (!) 123/103  Pulse: 108 96 72   Resp: 15 16 16 21   Temp:  98.1 F (36.7 C)  TempSrc:  Oral    SpO2: 96% 96% (!) 85%       Constitutional: NAD, calm, comfortable, facial ecchymoses  Eyes: PERTLA, lids and conjunctivae normal ENMT: Mucous membranes are moist. Posterior pharynx clear of any exudate or lesions.   Neck: normal, supple, no masses, no thyromegaly Respiratory: clear to auscultation bilaterally, no wheezing, no crackles. Normal respiratory effort.   Cardiovascular: Rate is ~80 and irregular with grade 3 systolic murmurs throughout precordium. 1+ pretibial edema b/l. Pedal pulses not palpable b/l. Abdomen: No distension, no tenderness, no masses palpated. Bowel sounds normal.  Musculoskeletal: Hands are cool bilaterally (with good radial pulses b/l), feet are cold bilaterally. BLE atrophy.  Skin: Ecchymoses about face and extremities with superficial abrasions and excoriations. Anterior right shin with two wounds, smaller of which has expressed pus.  Neurologic: CN 2-12 grossly intact. Sensation to light touch absent in b/l feet, dorsiflexion/plantarflexion intact b/l.    Psychiatric: Alert and oriented person and hospital only, not situation or year. Calm and cooperative.     Labs on Admission: I have personally reviewed following labs and imaging studies  CBC:  Recent Labs Lab 04/27/16 1017 04/29/16 0446 05/02/16 1643 05/02/16 1644  WBC 10.9* 11.5* 10.8*  --   NEUTROABS 8.5* 8.5* 8.2*  --   HGB 12.0 12.3 13.4  --   HCT 37.2  39.1 42.4  --   MCV 85.5 85.7 86.0  --   PLT 229 239 261 198   Basic Metabolic Panel:  Recent Labs Lab 04/27/16 1017 04/29/16 0446 05/02/16 1643  NA 136 139 135  K 4.0 3.7 4.4  CL 100* 101 100*  CO2 26 27 26   GLUCOSE 131* 128* 107*  BUN 20 15 16   CREATININE 1.08* 0.98 1.01*  CALCIUM 9.0 9.3 9.2   GFR: CrCl cannot be calculated (Unknown ideal weight.). Liver Function Tests:  Recent Labs Lab 04/27/16 1017 05/02/16 1643  AST 27 32  ALT 15 16  ALKPHOS 113 136*  BILITOT 2.0* 1.7*  PROT 6.4* 7.3  ALBUMIN 3.5 3.8   No results for input(s): LIPASE, AMYLASE in the last 168 hours. No results for input(s): AMMONIA in the last 168 hours. Coagulation Profile:  Recent Labs Lab 04/29/16 0446 05/02/16 1644  INR 1.69 1.91   Cardiac Enzymes: No results for input(s): CKTOTAL, CKMB, CKMBINDEX, TROPONINI in the last 168 hours. BNP (last 3 results) No results for input(s): PROBNP in the last 8760 hours. HbA1C: No results for input(s): HGBA1C in the last 72 hours. CBG:  Recent Labs Lab 04/29/16 0437  GLUCAP 105*   Lipid Profile: No results for input(s): CHOL, HDL, LDLCALC, TRIG, CHOLHDL, LDLDIRECT in the last 72 hours. Thyroid Function Tests: No results for input(s): TSH, T4TOTAL, FREET4, T3FREE, THYROIDAB in the last 72 hours. Anemia Panel: No results for input(s): VITAMINB12, FOLATE, FERRITIN, TIBC, IRON, RETICCTPCT in the last 72 hours. Urine analysis:    Component Value Date/Time   COLORURINE AMBER (A) 04/27/2016 1017   APPEARANCEUR CLEAR 04/27/2016 1017   LABSPEC 1.019 04/27/2016 1017   PHURINE 5.0 04/27/2016 1017   GLUCOSEU NEGATIVE 04/27/2016 1017   HGBUR NEGATIVE 04/27/2016 1017   BILIRUBINUR NEGATIVE 04/27/2016 1017   KETONESUR NEGATIVE 04/27/2016 1017   PROTEINUR 30 (A) 04/27/2016 1017   UROBILINOGEN 0.2 02/25/2014 1842   NITRITE NEGATIVE 04/27/2016 1017   LEUKOCYTESUR SMALL (A) 04/27/2016 1017   Sepsis  Labs: @LABRCNTIP (procalcitonin:4,lacticidven:4) ) Recent Results (from the past 240 hour(s))  Urine culture     Status: Abnormal  Collection Time: 04/27/16 10:17 AM  Result Value Ref Range Status   Specimen Description URINE, CATHETERIZED  Final   Special Requests NONE  Final   Culture >=100,000 COLONIES/mL ENTEROCOCCUS FAECALIS (A)  Final   Report Status 04/29/2016 FINAL  Final   Organism ID, Bacteria ENTEROCOCCUS FAECALIS (A)  Final      Susceptibility   Enterococcus faecalis - MIC*    AMPICILLIN <=2 SENSITIVE Sensitive     LEVOFLOXACIN 1 SENSITIVE Sensitive     NITROFURANTOIN <=16 SENSITIVE Sensitive     VANCOMYCIN 1 SENSITIVE Sensitive     * >=100,000 COLONIES/mL ENTEROCOCCUS FAECALIS     Radiological Exams on Admission: Dg Chest 2 View  Result Date: 05/02/2016 CLINICAL DATA:  Leg swelling EXAM: CHEST  2 VIEW COMPARISON:  04/29/2016 FINDINGS: Cardiomegaly is noted. Status post median sternotomy. Mild interstitial prominence bilaterally suspicious for mild interstitial edema. Trace bilateral pleural effusion with basilar atelectasis. No segmental infiltrate. Thoracic spine osteopenia. IMPRESSION: Cardiomegaly. Mild interstitial prominence bilateral suspicious for mild interstitial edema. No segmental infiltrate. Trace bilateral pleural effusion with bilateral basilar atelectasis. Electronically Signed   By: Natasha MeadLiviu  Pop M.D.   On: 05/02/2016 17:20   Dg Tibia/fibula Right  Result Date: 05/02/2016 CLINICAL DATA:  Fall with leg swelling and purulent drainage. EXAM: RIGHT TIBIA AND FIBULA - 2 VIEW COMPARISON:  None. FINDINGS: Nonspecific soft tissue swelling about the leg. There are changes of venous harvesting in the calf. Total knee arthroplasty which is located. Osteopenia. No acute fracture or subluxation. IMPRESSION: Soft tissue swelling without acute osseous finding. Electronically Signed   By: Marnee SpringJonathon  Watts M.D.   On: 05/02/2016 17:22   Ct Head Wo Contrast  Result Date:  05/02/2016 CLINICAL DATA:  Multiple recent falls. EXAM: CT HEAD WITHOUT CONTRAST TECHNIQUE: Contiguous axial images were obtained from the base of the skull through the vertex without intravenous contrast. COMPARISON:  04/29/2016 FINDINGS: Brain: There is no evidence for acute hemorrhage, hydrocephalus, mass lesion, or abnormal extra-axial fluid collection. No definite CT evidence for acute infarction. Diffuse loss of parenchymal volume is consistent with atrophy. Patchy low attenuation in the deep hemispheric and periventricular white matter is nonspecific, but likely reflects chronic microvascular ischemic demyelination. Vascular: Atherosclerotic calcification is visualized in the carotid arteries. No dense MCA sign. Major dural sinuses are unremarkable. Skull: No evidence for fracture. No worrisome lytic or sclerotic lesion. Sinuses/Orbits: The visualized paranasal sinuses and mastoid air cells are clear. Visualized portions of the globes and intraorbital fat are unremarkable. Other: Scalp contusion identified high right parietal region. IMPRESSION: 1. No acute intracranial abnormality. 2. Atrophy with chronic small vessel white matter ischemic disease. 3. Scalp contusion high right parietal region. Electronically Signed   By: Kennith CenterEric  Mansell M.D.   On: 05/02/2016 18:03    EKG: Independently reviewed. Atrial fibrillation, RAD.   Assessment/Plan  1. PAD, ?ishcemic feet  - Pt has hx of PAD with bifemoral bypass grating in 2014  - She presents from SNF for BLE edema, reportedly developing over the past day  - Pt denies any pain or discomfort on admission, but notes that there had been severe pain in both feet - Feet are cold bilaterally and pulses could not be obtained with doppler  - Vascular surgery was consulted by ED physician and requested [facility]; will follow-up recommendations    2. Right lower leg wound with cellulitis - Pt has two wounds on anterior right shin, the  smaller one with expression of pus and  surrounding erythema/edema  - Radiographs feature soft-tissue swelling, but no acute bony abnormality  - There is minimal leukocytosis, but no fever or end-organ damage  - She was treated with IV clindamycin in ED, plan to continue doxycycline BID for now  - Wound care consultation requested, will follow-up recommendations   3. Acute on chronic diastolic CHF  - TTE (10/03/15) with EF 55-60%, mod-sev MR, mod TR, and severe LAE  - Pt presents with increased BLE edema and mild interstitial edema is noted on CXR  - She is saturating well on rm air and not in any respiratory distress  - Lasix 40 mg IV given in ED - Plan to SLIV, follow daily wts and I/Os, resume home-dose Lasix 20 mg PO qD in the am   - Continue metoprolol as tolerated, hold losartan given IV diuresis and concern for renal insult, resume as appropriate   4. Atrial fibrillation  - Pt is in rate-controlled a fib on admission  - CHADS-VASc ~8 (age x2, hx CVA x2, CHF, CAD, HTN, gender)  - She is anticoagulated with Eliquis, but has suffered numerous falls in the past few months at the nursing home and presents with bruising and scrapes throughout; may need to re-weigh risk/benefit  - Continue Eliquis and Toprol for now   5. CAD - No anginal complaints  - Pt is s/p CABG with occluded grafts on subsequent cath  - Continue ASA 81, Toprol, Lovaza as tolerated; hold losartan for now while diuresing, resume as appropriate   6. Hypertension  - At goal currently  - Managed at home with Toprol and losartan - As above, Toprol has been continued and losartan held on admission     DVT prophylaxis: Eliquis  Code Status: DNR  Family Communication: Discussed with patient Disposition Plan: Observe on telemetry Consults called: Vascular surgery  Admission status: Observation    Briscoe Deutscher, MD [facility] Pager [phone]  If 7PM-7AM, please contact  night-coverage [email or web address] Password [email or web address]  05/02/2016, 8:01 PM

## 2016-05-02 NOTE — ED Triage Notes (Signed)
Per EMS[facility] bilateral LE edema. Staff reports that the swelling started today. Per EMS pt has a hx of CHF and takes lasix. Pt has a hx of dementia. Pt has a soft cast on right arm related to a fall.

## 2016-05-02 NOTE — ED Notes (Signed)
Attempted to doppler bilateral lower extremities but heard no pulse. Dr. Edward Jolly. [name] Short attempted also to check pulse.

## 2016-05-02 NOTE — ED Notes (Signed)
Attempted x 2 to obtain IV and get blood. Informed provider. Will place an IV consult.

## 2016-05-02 NOTE — ED Notes (Signed)
Bed: WA08 Expected date:  Expected time:  Means of arrival:  Comments: EMS- 82yo F, BLE edema

## 2016-05-02 NOTE — ED Notes (Signed)
[facility] at bedside. Pt is being transported via stretcher with belongings.

## 2016-05-02 NOTE — Clinical Social Work Note (Signed)
Clinical Social Work Assessment  Patient Details  Name: [name] Short MRN: [id number] Date of Birth: 19-Sep-1927  Date of referral:  05/02/16               Reason for consult:  Discharge Planning, Facility Placement                Permission sought to share information with:  Facility Industrial/product designerContact Representative Permission granted to share information::  Yes, Verbal Permission Granted  Name::        Agency::     Relationship::     Contact Information:     Housing/Transportation Living arrangements for the past 2 months:  Assisted Living Facility [facility]- memory care) Source of Information:  Adult Children (Daughter-debra) Patient Interpreter Needed:  None Criminal Activity/Legal Involvement Pertinent to Current Situation/Hospitalization:    Significant Relationships:  Adult Children Lives with:  Facility Resident Do you feel safe going back to the place where you live?  No Need for family participation in patient care:  Yes (Comment)  Care giving concerns:  Patients daughter has concerns [facility]. Patient has recently had multiple falls and patients PCP has stated she needs a higher level of care. Patients daughter requested more information regarding [place]-term care at a SNF.    Social Worker assessment / plan: Patients daughter requested more information regarding [place]. [facility] and patients daughter is interested in [place] care at their facility. AM CSW can help with information for [place]-term placement if needed.   Employment status:  Retired Health and safety inspectornsurance information:  Armed forces operational officerMedicare, Medicaid [place] Recommendations:  Not assessed at this time Information / Referral to community resources:     Patient/Family's Response to care: Patients daughter appreciated CSW.   Patient/Family's Understanding of and Emotional Response to Diagnosis, Current Treatment, and Prognosis:  Patients  daughter understood current treatment and prognosis.   Emotional Assessment Appearance:  Appears stated age Attitude/Demeanor/Rapport:    Affect (typically observed):  Unable to Assess Orientation:  Oriented to Self Alcohol / Substance use:    Psych involvement (Current and /or in the community):  No (Comment)  Discharge Needs  Concerns to be addressed:  Discharge Planning Concerns Readmission within the last 30 days:  No Current discharge risk:  None Barriers to Discharge:  No Barriers Identified   Donnie Coffinrin M Chantele Corado, LCSW 05/02/2016, 10:08 PM

## 2016-05-03 ENCOUNTER — Observation Stay (HOSPITAL_BASED_OUTPATIENT_CLINIC_OR_DEPARTMENT_OTHER): Payer: Medicare Other

## 2016-05-03 ENCOUNTER — Observation Stay (HOSPITAL_COMMUNITY): Payer: Medicare Other

## 2016-05-03 DIAGNOSIS — E1151 Type 2 diabetes mellitus with diabetic peripheral angiopathy without gangrene: Secondary | ICD-10-CM | POA: Diagnosis not present

## 2016-05-03 DIAGNOSIS — I25700 Atherosclerosis of coronary artery bypass graft(s), unspecified, with unstable angina pectoris: Secondary | ICD-10-CM | POA: Diagnosis not present

## 2016-05-03 DIAGNOSIS — I110 Hypertensive heart disease with heart failure: Secondary | ICD-10-CM | POA: Diagnosis not present

## 2016-05-03 DIAGNOSIS — I25709 Atherosclerosis of coronary artery bypass graft(s), unspecified, with unspecified angina pectoris: Secondary | ICD-10-CM | POA: Diagnosis not present

## 2016-05-03 DIAGNOSIS — I10 Essential (primary) hypertension: Secondary | ICD-10-CM

## 2016-05-03 DIAGNOSIS — I5033 Acute on chronic diastolic (congestive) heart failure: Secondary | ICD-10-CM | POA: Diagnosis not present

## 2016-05-03 DIAGNOSIS — I4891 Unspecified atrial fibrillation: Secondary | ICD-10-CM | POA: Diagnosis not present

## 2016-05-03 DIAGNOSIS — I25810 Atherosclerosis of coronary artery bypass graft(s) without angina pectoris: Secondary | ICD-10-CM | POA: Diagnosis not present

## 2016-05-03 DIAGNOSIS — L03031 Cellulitis of right toe: Secondary | ICD-10-CM | POA: Diagnosis not present

## 2016-05-03 DIAGNOSIS — R079 Chest pain, unspecified: Secondary | ICD-10-CM | POA: Diagnosis not present

## 2016-05-03 DIAGNOSIS — I998 Other disorder of circulatory system: Secondary | ICD-10-CM | POA: Diagnosis not present

## 2016-05-03 LAB — TROPONIN I
TROPONIN I: 0.03 ng/mL — AB (ref <0.03)
TROPONIN I: 0.03 ng/mL — AB (ref <0.03)

## 2016-05-03 LAB — MRSA PCR SCREENING: MRSA by PCR: POSITIVE — AB

## 2016-05-03 LAB — ECHOCARDIOGRAM COMPLETE: Weight: 1897.6 oz

## 2016-05-03 LAB — URIC ACID: URIC ACID, SERUM: 6.1 mg/dL (ref 2.3–6.6)

## 2016-05-03 MED ORDER — NITROGLYCERIN 0.2 MG/HR TD PT24
0.2000 mg | MEDICATED_PATCH | Freq: Every day | TRANSDERMAL | Status: DC
  Administered 2016-05-03 – 2016-05-06 (×4): 0.2 mg via TRANSDERMAL
  Filled 2016-05-03 (×4): qty 1

## 2016-05-03 MED ORDER — NITROGLYCERIN 0.4 MG SL SUBL
SUBLINGUAL_TABLET | SUBLINGUAL | Status: AC
  Administered 2016-05-03: 0.4 mg
  Filled 2016-05-03: qty 1

## 2016-05-03 MED ORDER — CHLORHEXIDINE GLUCONATE CLOTH 2 % EX PADS: 6.0000 | MEDICATED_PAD | Freq: Every day | CUTANEOUS | Status: DC

## 2016-05-03 MED ORDER — COLLAGENASE 250 UNIT/GM EX OINT
TOPICAL_OINTMENT | Freq: Every day | CUTANEOUS | Status: DC
  Administered 2016-05-03 – 2016-05-06 (×4): via TOPICAL
  Filled 2016-05-03: qty 30

## 2016-05-03 MED ORDER — MUPIROCIN 2 % EX OINT
1.0000 "application " | TOPICAL_OINTMENT | Freq: Two times a day (BID) | CUTANEOUS | Status: DC
  Administered 2016-05-03 – 2016-05-06 (×7): 1 via NASAL
  Filled 2016-05-03 (×2): qty 22

## 2016-05-03 MED ORDER — CHLORHEXIDINE GLUCONATE CLOTH 2 % EX PADS
6.0000 | MEDICATED_PAD | Freq: Every day | CUTANEOUS | Status: DC
  Administered 2016-05-03 – 2016-05-06 (×4): 6 via TOPICAL

## 2016-05-03 MED ORDER — MUPIROCIN 2 % EX OINT: 1.0000 "application " | TOPICAL_OINTMENT | Freq: Two times a day (BID) | CUTANEOUS | Status: DC

## 2016-05-03 NOTE — Progress Notes (Signed)
[facility] PROGRESS NOTE  [name] Short ZOX:[id number] DOB: [birth year] DOA: 05/02/2016   PCP: [name] Short     Assessment/Plan: Principal Problem:   Ischemic foot Active Problems:   Diabetes mellitus type II, controlled (HCC)   Atrial fibrillation with controlled ventricular response (HCC)   CAD (coronary artery disease) of artery bypass graft   Essential hypertension   Cellulitis   PAD (peripheral artery disease) (HCC)   Acute on chronic diastolic CHF (congestive heart failure) (HCC)   Cellulitis of right lower extremity   Leg edema    82 y.o. female with medical history significant for vascular dementia, coronary artery disease s/p CABG, PAD status-post bifemoral bypass, chronic diastolic CHF, and atrial fibrillation on Eliquis, now presenting from her SNF for evaluation of bilateral lower extremity edema and foot pain.Patient's bilateral feet were noted to be cold and pulses could not be identified with palpation or with Doppler. Vascular surgery was consulted by the ED physician and advised a medical admission [facility] for further evaluation and management  Assessment and plan 1. Bilateral painful feet - Pt has hx of PAD with bifemoral bypass grating in 2014  - She presents from SNF for BLE edema, reportedly developing over the past day  notes that there had been severe pain in both feet - Feet are cold bilaterally and pulses could not be obtained with doppler  - Vascular surgery was consulted -Patent axillofemoral and femorofemoral bypasses. Good Doppler flow to her feet, no evidence of ischemia Vascular has signed off  uric acid 6.1   2. Right lower leg wound with cellulitis - Pt has two wounds on anterior right shin, the smaller one with expression of pus and surrounding erythema/edema  - Radiographs feature soft-tissue swelling, but no acute bony abnormality  - There is minimal leukocytosis, but no fever or end-organ damage  - She  was treated with IV clindamycin in ED, plan to continue doxycycline BID for now  - Wound care consultation requested, will follow-up recommendations   3. Acute on chronic diastolic CHF  - TTE (10/03/15) with EF 55-60%, mod-sev MR, mod TR, and severe LAE  - Pt presents with increased BLE edema and mild interstitial edema is noted on CXR  - She is saturating well on rm air and not in any respiratory distress  - Lasix 40 mg IV given in ED Continue Lasix, metoprolol hold losartan given IV diuresis and concern for renal insult, resume as appropriate  Repeat Echo as patient complained of chest pain  4. Atrial fibrillation  - Pt is in rate-controlled a fib on admission  - CHADS-VASc ~8 (age x2, hx CVA x2, CHF, CAD, HTN, gender)  - She is anticoagulated with Eliquis, but has suffered numerous falls in the past few months at the nursing home and presents with bruising and scrapes throughout; may need to re-weigh risk/benefit  - Continue Eliquis and Toprol for now   5. CAD -had CP this am  - Pt is s/p CABG with occluded grafts on subsequent cath  - Continue ASA 81, Toprol, Lovaza as tolerated; hold losartan for now while diuresing, resume as appropriate  check troponin, cardiology consulted   6. Hypertension  - Managed at home with Toprol and losartan - As above, Toprol has been continued and losartan held on admission    7. Recent enterococcal urinary tract infection Repeat UA , given leukocytosis  8. Right humerus fracture  follow with ortho outpatient   DVT  prophylaxis: Eliquis   Code Status:  DO NOT RESUSCITATE     Code Status Orders        Start     Ordered    Family Communication: Discussed in detail with the patient' daughter , all imaging results, lab results explained to the patient   Disposition Plan:  In am    Consultants:  Vascular surgery  Procedures:  None  Antibiotics: Anti-infectives    Start     Dose/Rate Route Frequency Ordered Stop   05/02/16  2200  doxycycline (VIBRA-TABS) tablet 100 mg     100 mg Oral Every 12 hours 05/02/16 1958     05/02/16 1700  clindamycin (CLEOCIN) IVPB 900 mg     900 mg 100 mL/hr over 30 Minutes Intravenous  Once 05/02/16 1645 05/02/16 1803         HPI/Subjective: Complaining  of chest pain  Objective: Vitals:   05/02/16 2154 05/02/16 2258 05/03/16 0100 05/03/16 0459  BP: 114/81 (!) 121/56  110/74  Pulse: 107 (!) 59  (!) 53  Resp: 20 20  20   Temp: 98.1 F (36.7 C) 98 F (36.7 C)  98.6 F (37 C)  TempSrc: Oral Oral  Oral  SpO2: 93% (!) 80% 92% 96%  Weight:    53.8 kg (118 lb 9.6 oz)   No intake or output data in the 24 hours ending 05/03/16 0913  Exam:  Cardiovascular: Rate is ~80 and irregular with grade 3 systolic murmurs throughout precordium. 1+ pretibial edema b/l. Pedal pulses not palpable b/l. Abdomen: No distension, no tenderness, no masses palpated. Bowel sounds normal.  Musculoskeletal: Hands are cool bilaterally (with good radial pulses b/l), feet are cold bilaterally. BLE atrophy.  Skin: Ecchymoses about face and extremities with superficial abrasions and excoriations. Anterior right shin with two wounds, smaller of which has expressed pus.  Neurologic: CN 2-12 grossly intact. Sensation to light touch absent in b/l feet, dorsiflexion/plantarflexion intact b/l.       Data Reviewed: I have personally reviewed following labs and imaging studies  Micro Results Recent Results (from the past 240 hour(s))  Urine culture     Status: Abnormal   Collection Time: 04/27/16 10:17 AM  Result Value Ref Range Status   Specimen Description URINE, CATHETERIZED  Final   Special Requests NONE  Final   Culture >=100,000 COLONIES/mL ENTEROCOCCUS FAECALIS (A)  Final   Report Status 04/29/2016 FINAL  Final   Organism ID, Bacteria ENTEROCOCCUS FAECALIS (A)  Final      Susceptibility   Enterococcus faecalis - MIC*    AMPICILLIN <=2 SENSITIVE Sensitive     LEVOFLOXACIN 1 SENSITIVE Sensitive      NITROFURANTOIN <=16 SENSITIVE Sensitive     VANCOMYCIN 1 SENSITIVE Sensitive     * >=100,000 COLONIES/mL ENTEROCOCCUS FAECALIS  MRSA PCR Screening     Status: Abnormal   Collection Time: 05/02/16 10:58 PM  Result Value Ref Range Status   MRSA by PCR POSITIVE (A) NEGATIVE Final    Comment:        The GeneXpert MRSA Assay (FDA approved for NASAL specimens only), is one component of a comprehensive MRSA colonization surveillance program. It is not intended to diagnose MRSA infection nor to guide or monitor treatment for MRSA infections. RESULT CALLED TO, READ BACK BY AND VERIFIED WITH: A SLOANE,RN @0233  05/03/16 MKELLY,MLT     Radiology Reports Dg Chest 2 View  Result Date: 05/02/2016 CLINICAL DATA:  Leg swelling EXAM: CHEST  2 VIEW COMPARISON:  04/29/2016  FINDINGS: Cardiomegaly is noted. Status post median sternotomy. Mild interstitial prominence bilaterally suspicious for mild interstitial edema. Trace bilateral pleural effusion with basilar atelectasis. No segmental infiltrate. Thoracic spine osteopenia. IMPRESSION: Cardiomegaly. Mild interstitial prominence bilateral suspicious for mild interstitial edema. No segmental infiltrate. Trace bilateral pleural effusion with bilateral basilar atelectasis. Electronically Signed   By: Natasha Mead M.D.   On: 05/02/2016 17:20   Dg Chest 2 View  Result Date: 04/29/2016 CLINICAL DATA:  Larey Seat.  Right hip pain. EXAM: CHEST  2 VIEW COMPARISON:  04/27/2016 FINDINGS: Previous median sternotomy and CABG. Chronic cardiomegaly. Lungs are clear. No edema, infiltrate, collapse or effusion. No thoracic bone trauma. IMPRESSION: Chronic cardiomegaly.  No active disease. Electronically Signed   By: Paulina Fusi M.D.   On: 04/29/2016 06:26   Dg Chest 2 View  Result Date: 04/27/2016 CLINICAL DATA:  Unwitnessed fall. EXAM: CHEST  2 VIEW COMPARISON:  Radiographs of April 04, 2016. FINDINGS: Stable cardiomegaly. Status post coronary artery bypass graft. No  pneumothorax is noted. Probable mild left pleural effusion is noted with adjacent subsegmental atelectasis. Atherosclerosis of thoracic aorta is noted. Bony thorax is unremarkable. IMPRESSION: Probable mild left basilar subsegmental atelectasis with associated pleural effusion. Electronically Signed   By: Lupita Raider, M.D.   On: 04/27/2016 10:08   Dg Chest 2 View  Result Date: 04/04/2016 CLINICAL DATA:  Larey Seat today. EXAM: CHEST  2 VIEW COMPARISON:  03/20/2016 FINDINGS: Prior CABG. Cardiomegaly with mild vascular congestion. No confluent opacities, effusions or edema. No visible rib fracture or pneumothorax. IMPRESSION: Cardiomegaly with vascular congestion. Electronically Signed   By: Charlett Nose M.D.   On: 04/04/2016 09:29   Dg Pelvis 1-2 Views  Result Date: 04/29/2016 CLINICAL DATA:  Larey Seat.  Right hip pain. EXAM: PELVIS - 1-2 VIEW COMPARISON:  04/27/2016 FINDINGS: There is no evidence of pelvic fracture or diastasis. No pelvic bone lesions are seen. IMPRESSION: Negative. Electronically Signed   By: Paulina Fusi M.D.   On: 04/29/2016 06:28   Dg Pelvis 1-2 Views  Result Date: 04/27/2016 CLINICAL DATA:  Fall. EXAM: PELVIS - 1-2 VIEW COMPARISON:  CT pelvis 04/03/2016 FINDINGS: There is no evidence of pelvic fracture or diastasis. No pelvic bone lesions are seen. Surgical clips in the groin bilaterally from prior vascular bypass grafting. IMPRESSION: Negative. Electronically Signed   By: Marlan Palau M.D.   On: 04/27/2016 10:08   Dg Forearm Right  Result Date: 04/27/2016 CLINICAL DATA:  Fall. EXAM: RIGHT FOREARM - 2 VIEW COMPARISON:  None. FINDINGS: Irregularity of the radial neck could be due to degenerative spurring versus nondisplaced fracture. Correlate with pain and follow-up x-rays. Degenerative change in the elbow joint. Soft tissue calcification ventral to the distal humerus. Degenerative change in the wrist joint with chondrocalcinosis. IMPRESSION: Cannot exclude radial neck fracture.  Correlate with pain in follow-up x-rays if appropriate. Electronically Signed   By: Marlan Palau M.D.   On: 04/27/2016 10:07   Dg Tibia/fibula Right  Result Date: 05/02/2016 CLINICAL DATA:  Fall with leg swelling and purulent drainage. EXAM: RIGHT TIBIA AND FIBULA - 2 VIEW COMPARISON:  None. FINDINGS: Nonspecific soft tissue swelling about the leg. There are changes of venous harvesting in the calf. Total knee arthroplasty which is located. Osteopenia. No acute fracture or subluxation. IMPRESSION: Soft tissue swelling without acute osseous finding. Electronically Signed   By: Marnee Spring M.D.   On: 05/02/2016 17:22   Ct Head Wo Contrast  Result Date: 05/02/2016 CLINICAL DATA:  Multiple recent falls.  EXAM: CT HEAD WITHOUT CONTRAST TECHNIQUE: Contiguous axial images were obtained from the base of the skull through the vertex without intravenous contrast. COMPARISON:  04/29/2016 FINDINGS: Brain: There is no evidence for acute hemorrhage, hydrocephalus, mass lesion, or abnormal extra-axial fluid collection. No definite CT evidence for acute infarction. Diffuse loss of parenchymal volume is consistent with atrophy. Patchy low attenuation in the deep hemispheric and periventricular white matter is nonspecific, but likely reflects chronic microvascular ischemic demyelination. Vascular: Atherosclerotic calcification is visualized in the carotid arteries. No dense MCA sign. Major dural sinuses are unremarkable. Skull: No evidence for fracture. No worrisome lytic or sclerotic lesion. Sinuses/Orbits: The visualized paranasal sinuses and mastoid air cells are clear. Visualized portions of the globes and intraorbital fat are unremarkable. Other: Scalp contusion identified high right parietal region. IMPRESSION: 1. No acute intracranial abnormality. 2. Atrophy with chronic small vessel white matter ischemic disease. 3. Scalp contusion high right parietal region. Electronically Signed   By: Kennith Center M.D.   On:  05/02/2016 18:03   Ct Head Wo Contrast  Result Date: 04/29/2016 CLINICAL DATA:  Found down her bed, history of multiple falls EXAM: CT HEAD WITHOUT CONTRAST CT CERVICAL SPINE WITHOUT CONTRAST TECHNIQUE: Multidetector CT imaging of the head and cervical spine was performed following the standard protocol without intravenous contrast. Multiplanar CT image reconstructions of the cervical spine were also generated. COMPARISON:  04/27/2016 FINDINGS: CT HEAD FINDINGS Brain: No intracranial hemorrhage, mass effect or midline shift. There are motion artifacts. Stable atrophy and chronic white matter disease. Ventricular size is stable from prior exam. No acute cortical infarction. No mass lesion is noted on this unenhanced scan. Vascular: Again noted atherosclerotic calcifications of carotid siphon. Skull: No skull fracture is noted. The examination is limited by motion artifacts. Sinuses/Orbits: No acute findings. Other: Again noted scalp hematoma in right parietal region. CT CERVICAL SPINE FINDINGS Alignment: The alignment is preserved. Skull base and vertebrae: No acute fracture or subluxation. There are degenerative changes C1-C2 articulation. Soft tissues and spinal canal: No prevertebral soft tissue swelling. No spinal canal hematoma. Disc levels: There is disc space flattening with mild anterior and mild posterior spurring at C4-C5 level. Disc space flattening with mild anterior and mild posterior spurring at C6-C7 level. Upper chest: There is no pneumothorax in visualized lung apices. Other: Atherosclerotic calcifications are noted bilateral carotid bifurcation. There is thickening of upper esophageal wall. Gastroesophageal reflux cannot be excluded. Extensive atherosclerotic calcifications of thoracic aorta. IMPRESSION: 1. No acute intracranial abnormality. Stable atrophy and chronic white matter disease. No definite acute cortical infarction. Again noted scalp hematoma in right parietal region. Please note  there are motion artifacts. 2. No cervical spine acute fracture or subluxation. Mild degenerative changes as described above. No significant change. Radiation exposure alert: this patient has had an unusual number of CT scans. To prevent further radiation exposure in the future, non radiating studies are recommended such as ultrasound or MRI. Starting 01/27/16 the patient underwent 12 CT scan examinations. Electronically Signed   By: Natasha Mead M.D.   On: 04/29/2016 09:22   Ct Head Wo Contrast  Result Date: 04/27/2016 CLINICAL DATA:  The patient fell out of bed last night. She was found down. Initial encounter. EXAM: CT HEAD WITHOUT CONTRAST CT CERVICAL SPINE WITHOUT CONTRAST TECHNIQUE: Multidetector CT imaging of the head and cervical spine was performed following the standard protocol without intravenous contrast. Multiplanar CT image reconstructions of the cervical spine were also generated. COMPARISON:  Head and cervical spine CT scans  03/20/2016. Head CT scan 04/25/2016. FINDINGS: CT HEAD FINDINGS Brain: Cortical atrophy and chronic microvascular ischemic change are identified. No evidence of acute intracranial abnormality including hemorrhage, infarct, mass lesion, mass effect, midline shift or abnormal extra-axial fluid collection is identified. No hydrocephalus or pneumocephalus. Vascular: Atherosclerotic vascular disease is seen. Skull: Intact.  No focal lesion. Sinuses/Orbits: Negative. Other: Scalp hematoma over the right parietal bone is identified. CT CERVICAL SPINE FINDINGS Alignment: Maintained. Skull base and vertebrae: No acute fracture or focal lesion. Soft tissues and spinal canal: No prevertebral fluid or swelling. No visible canal hematoma. Disc levels: Scattered facet degenerative change is again seen. Loss of disc space height and endplate spurring appear worst at C4-5 and C6-7. Upper chest: Atherosclerosis noted. Other: None. IMPRESSION: Scalp hematoma over the right parietal bone. No  acute intracranial abnormality. No acute abnormality cervical spine. Atrophy and chronic microvascular ischemic change. Atherosclerosis. Electronically Signed   By: Drusilla Kanner M.D.   On: 04/27/2016 10:49   Ct Head Wo Contrast  Result Date: 04/25/2016 CLINICAL DATA:  82 y/o F; fell from wheelchair with injury to back of head and pain. EXAM: CT HEAD WITHOUT CONTRAST TECHNIQUE: Contiguous axial images were obtained from the base of the skull through the vertex without intravenous contrast. COMPARISON:  04/16/2016 CT of the head. FINDINGS: Brain: No evidence of acute infarction, hemorrhage, hydrocephalus, extra-axial collection or mass lesion/mass effect. Patchy foci of hypoattenuation within subcortical and periventricular white matter are compatible with moderate chronic microvascular ischemic changes. Lucency in the right became in is unchanged consistent with chronic lacunar infarct. Mild brain parenchymal volume loss. Vascular: Extensive calcific atherosclerosis of the cavernous internal carotid arteries. Skull: Right parietal scalp small contusion. No displaced calvarial fracture. Sinuses/Orbits: No acute finding. Underpneumatized frontal sinuses. Bilateral intra-ocular lens replacement. Other: None. IMPRESSION: 1. Right parietal small scalp contusion. No displaced calvarial fracture. 2. No acute intracranial abnormality is identified. 3. Stable brain parenchymal volume loss and chronic microvascular ischemic changes. Electronically Signed   By: Mitzi Hansen M.D.   On: 04/25/2016 23:03   Ct Head Wo Contrast  Result Date: 04/16/2016 CLINICAL DATA:  82 year old female with history of fall with injury to the head today. EXAM: CT HEAD WITHOUT CONTRAST TECHNIQUE: Contiguous axial images were obtained from the base of the skull through the vertex without intravenous contrast. COMPARISON:  Head CT 04/04/2016. FINDINGS: Brain: Mild cerebral atrophy. Patchy and confluent areas of decreased  attenuation are noted throughout the deep and periventricular white matter of the cerebral hemispheres bilaterally, compatible with chronic microvascular ischemic disease. Physiologic calcifications in the basal ganglia bilaterally. No evidence of acute infarction, hemorrhage, hydrocephalus, extra-axial collection or mass lesion/mass effect. Vascular: No hyperdense vessel or unexpected calcification. Skull: Normal. Negative for fracture or focal lesion. Sinuses/Orbits: No acute finding. Other: None. IMPRESSION: 1. No evidence of significant acute traumatic injury to the skull or brain. 2. Mild cerebral atrophy. 3. Chronic microvascular ischemic changes in the cerebral white matter, as above. Electronically Signed   By: Trudie Reed M.D.   On: 04/16/2016 13:03   Ct Head Wo Contrast  Result Date: 04/04/2016 CLINICAL DATA:  Fall from ladder yesterday and again falling this morning EXAM: CT HEAD WITHOUT CONTRAST CT MAXILLOFACIAL WITHOUT CONTRAST CT CERVICAL SPINE WITHOUT CONTRAST TECHNIQUE: Multidetector CT imaging of the head, cervical spine, and maxillofacial structures were performed using the standard protocol without intravenous contrast. Multiplanar CT image reconstructions of the cervical spine and maxillofacial structures were also generated. COMPARISON:  04/03/2016 FINDINGS: CT HEAD FINDINGS  Brain: No evidence of acute infarction, hemorrhage, hydrocephalus, extra-axial collection or mass lesion/mass effect. Diffuse atrophic changes and chronic white matter ischemic changes are again noted. Vascular: No hyperdense vessel or unexpected calcification. Skull: Normal. Negative for fracture or focal lesion. Other: None. CT MAXILLOFACIAL FINDINGS Osseous: No fracture or mandibular dislocation. Some lucency is noted about the left central mandibular incisor and left mandibular canine similar to that noted on the prior exam. Some destruction of the left mandibular canine is noted also stable from the prior exam  likely related to dental caries. Multiple missing teeth are seen. Dental caries are noted within the maxillary molars on the left and maxillary molar on the right. These changes are stable from the prior exam. Orbits: Negative. No traumatic or inflammatory finding. Sinuses: Clear. Soft tissues: Mild soft tissue swelling is also noted in the area of the lip stable from the prior exam. No focal hematoma is seen. CT CERVICAL SPINE FINDINGS Alignment: Cervical alignment is within normal limits. Skull base and vertebrae: 7 cervical segments are well visualized. Some degenerative changes are noted at the articulation of the odontoid and C1. Multilevel facet hypertrophic changes are seen. No acute fracture or acute facet abnormality is noted. Soft tissues and spinal canal: No prevertebral fluid or swelling. No visible canal hematoma. Disc levels: Disc space narrowing is noted at C6-7 with associated osteophytes. Very mild osteophytes are noted at multiple levels. Only mild neural foraminal narrowing is noted at C6-7 bilaterally. Upper chest: Within normal limits. IMPRESSION: CT of the head: Chronic atrophic and ischemic changes similar to that seen on the prior exam. CT of maxillofacial bones: Multiple dental caries as well as lucency surrounding the mandibular central incisor and canine similar to that noted on the prior exam. No acute fracture is seen. CT of the cervical spine: Multilevel degenerative change without acute abnormality. Electronically Signed   By: Alcide Clever M.D.   On: 04/04/2016 10:06   Ct Chest W Contrast  Result Date: 04/29/2016 CLINICAL DATA:  Fall.  Patient on blood thinners. EXAM: CT CHEST, ABDOMEN, AND PELVIS WITH CONTRAST TECHNIQUE: Multidetector CT imaging of the chest, abdomen and pelvis was performed following the standard protocol during bolus administration of intravenous contrast. CONTRAST:  75mL ISOVUE-300 IOPAMIDOL (ISOVUE-300) INJECTION 61% COMPARISON:  None. FINDINGS: CT CHEST  FINDINGS Cardiovascular: Cardiomegaly. Diffuse coronary artery and aortic calcifications. No evidence of aortic aneurysm. Right axillofemoral bypass graft noted which is patent. Mediastinum/Nodes: No mediastinal, hilar, or axillary adenopathy. Lungs/Pleura: Moderate bilateral pleural effusions. No confluent airspace opacities or pneumothorax. Musculoskeletal: Fracture noted through the posterior left twelfth rib. This appears chronic as the bone margins are well corticated. Probable old fracture through the lateral seventh rib. No definite acute bony abnormality. CT ABDOMEN PELVIS FINDINGS Hepatobiliary: No focal hepatic abnormality. Gallbladder unremarkable. Pancreas: No focal abnormality or ductal dilatation. Spleen: No focal abnormality.  Normal size. Adrenals/Urinary Tract: No adrenal abnormality. No focal renal abnormality. No stones or hydronephrosis. Urinary bladder is unremarkable. Stomach/Bowel: Descending colonic and sigmoid diverticulosis. No active diverticulitis. Stomach, large and small bowel decompressed, otherwise grossly unremarkable. 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 There appears to be and included aorto femoral bypass graft. Fem-fem crossover bypass graft is noted and is patent. Right axillofemoral bypass graft also noted, patent. No evidence of aortic aneurysm. No adenopathy. Reproductive: Prior hysterectomy.  No adnexal masses. Other: No free fluid or free air. Musculoskeletal: No acute bony abnormality or focal bone lesion. IMPRESSION: No evidence of significant traumatic injury in the  chest, abdomen or pelvis. Old appearing right lateral seventh rib fracture and left posterior twelfth rib fracture. No definite acute fracture. Moderate bilateral pleural effusions. Cardiomegaly, coronary artery disease. Aortic atherosclerosis. 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 Patent right axillofemoral bypass and fem-fem crossover bypass graft.  Left colonic diverticulosis. Electronically Signed   By: Charlett NoseKevin  Dover M.D.   On: 04/29/2016 09:14   Ct Cervical Spine Wo Contrast  Result Date: 04/29/2016 CLINICAL DATA:  Found down her bed, history of multiple falls EXAM: CT HEAD WITHOUT CONTRAST CT CERVICAL SPINE WITHOUT CONTRAST TECHNIQUE: Multidetector CT imaging of the head and cervical spine was performed following the standard protocol without intravenous contrast. Multiplanar CT image reconstructions of the cervical spine were also generated. COMPARISON:  04/27/2016 FINDINGS: CT HEAD FINDINGS Brain: No intracranial hemorrhage, mass effect or midline shift. There are motion artifacts. Stable atrophy and chronic white matter disease. Ventricular size is stable from prior exam. No acute cortical infarction. No mass lesion is noted on this unenhanced scan. Vascular: Again noted atherosclerotic calcifications of carotid siphon. Skull: No skull fracture is noted. The examination is limited by motion artifacts. Sinuses/Orbits: No acute findings. Other: Again noted scalp hematoma in right parietal region. CT CERVICAL SPINE FINDINGS Alignment: The alignment is preserved. Skull base and vertebrae: No acute fracture or subluxation. There are degenerative changes C1-C2 articulation. Soft tissues and spinal canal: No prevertebral soft tissue swelling. No spinal canal hematoma. Disc levels: There is disc space flattening with mild anterior and mild posterior spurring at C4-C5 level. Disc space flattening with mild anterior and mild posterior spurring at C6-C7 level. Upper chest: There is no pneumothorax in visualized lung apices. Other: Atherosclerotic calcifications are noted bilateral carotid bifurcation. There is thickening of upper esophageal wall. Gastroesophageal reflux cannot be excluded. Extensive atherosclerotic calcifications of thoracic aorta. IMPRESSION: 1. No acute intracranial abnormality. Stable atrophy and chronic white matter disease. No definite  acute cortical infarction. Again noted scalp hematoma in right parietal region. Please note there are motion artifacts. 2. No cervical spine acute fracture or subluxation. Mild degenerative changes as described above. No significant change. Radiation exposure alert: this patient has had an unusual number of CT scans. To prevent further radiation exposure in the future, non radiating studies are recommended such as ultrasound or MRI. Starting 01/27/16 the patient underwent 12 CT scan examinations. Electronically Signed   By: Natasha MeadLiviu  Pop M.D.   On: 04/29/2016 09:22   Ct Cervical Spine Wo Contrast  Result Date: 04/27/2016 CLINICAL DATA:  The patient fell out of bed last night. She was found down. Initial encounter. EXAM: CT HEAD WITHOUT CONTRAST CT CERVICAL SPINE WITHOUT CONTRAST TECHNIQUE: Multidetector CT imaging of the head and cervical spine was performed following the standard protocol without intravenous contrast. Multiplanar CT image reconstructions of the cervical spine were also generated. COMPARISON:  Head and cervical spine CT scans 03/20/2016. Head CT scan 04/25/2016. FINDINGS: CT HEAD FINDINGS Brain: Cortical atrophy and chronic microvascular ischemic change are identified. No evidence of acute intracranial abnormality including hemorrhage, infarct, mass lesion, mass effect, midline shift or abnormal extra-axial fluid collection is identified. No hydrocephalus or pneumocephalus. Vascular: Atherosclerotic vascular disease is seen. Skull: Intact.  No focal lesion. Sinuses/Orbits: Negative. Other: Scalp hematoma over the right parietal bone is identified. CT CERVICAL SPINE FINDINGS Alignment: Maintained. Skull base and vertebrae: No acute fracture or focal lesion. Soft tissues and spinal canal: No prevertebral fluid or swelling. No visible canal hematoma. Disc levels: Scattered facet degenerative change  is again seen. Loss of disc space height and endplate spurring appear worst at C4-5 and C6-7. Upper  chest: Atherosclerosis noted. Other: None. IMPRESSION: Scalp hematoma over the right parietal bone. No acute intracranial abnormality. No acute abnormality cervical spine. Atrophy and chronic microvascular ischemic change. Atherosclerosis. Electronically Signed   By: Drusilla Kanner M.D.   On: 04/27/2016 10:49   Ct Cervical Spine Wo Contrast  Result Date: 04/04/2016 CLINICAL DATA:  Fall from ladder yesterday and again falling this morning EXAM: CT HEAD WITHOUT CONTRAST CT MAXILLOFACIAL WITHOUT CONTRAST CT CERVICAL SPINE WITHOUT CONTRAST TECHNIQUE: Multidetector CT imaging of the head, cervical spine, and maxillofacial structures were performed using the standard protocol without intravenous contrast. Multiplanar CT image reconstructions of the cervical spine and maxillofacial structures were also generated. COMPARISON:  04/03/2016 FINDINGS: CT HEAD FINDINGS Brain: No evidence of acute infarction, hemorrhage, hydrocephalus, extra-axial collection or mass lesion/mass effect. Diffuse atrophic changes and chronic white matter ischemic changes are again noted. Vascular: No hyperdense vessel or unexpected calcification. Skull: Normal. Negative for fracture or focal lesion. Other: None. CT MAXILLOFACIAL FINDINGS Osseous: No fracture or mandibular dislocation. Some lucency is noted about the left central mandibular incisor and left mandibular canine similar to that noted on the prior exam. Some destruction of the left mandibular canine is noted also stable from the prior exam likely related to dental caries. Multiple missing teeth are seen. Dental caries are noted within the maxillary molars on the left and maxillary molar on the right. These changes are stable from the prior exam. Orbits: Negative. No traumatic or inflammatory finding. Sinuses: Clear. Soft tissues: Mild soft tissue swelling is also noted in the area of the lip stable from the prior exam. No focal hematoma is seen. CT CERVICAL SPINE FINDINGS  Alignment: Cervical alignment is within normal limits. Skull base and vertebrae: 7 cervical segments are well visualized. Some degenerative changes are noted at the articulation of the odontoid and C1. Multilevel facet hypertrophic changes are seen. No acute fracture or acute facet abnormality is noted. Soft tissues and spinal canal: No prevertebral fluid or swelling. No visible canal hematoma. Disc levels: Disc space narrowing is noted at C6-7 with associated osteophytes. Very mild osteophytes are noted at multiple levels. Only mild neural foraminal narrowing is noted at C6-7 bilaterally. Upper chest: Within normal limits. IMPRESSION: CT of the head: Chronic atrophic and ischemic changes similar to that seen on the prior exam. CT of maxillofacial bones: Multiple dental caries as well as lucency surrounding the mandibular central incisor and canine similar to that noted on the prior exam. No acute fracture is seen. CT of the cervical spine: Multilevel degenerative change without acute abnormality. Electronically Signed   By: Alcide Clever M.D.   On: 04/04/2016 10:06   Ct Abdomen Pelvis W Contrast  Result Date: 04/29/2016 CLINICAL DATA:  Fall.  Patient on blood thinners. EXAM: CT CHEST, ABDOMEN, AND PELVIS WITH CONTRAST TECHNIQUE: Multidetector CT imaging of the chest, abdomen and pelvis was performed following the standard protocol during bolus administration of intravenous contrast. CONTRAST:  75mL ISOVUE-300 IOPAMIDOL (ISOVUE-300) INJECTION 61% COMPARISON:  None. FINDINGS: CT CHEST FINDINGS Cardiovascular: Cardiomegaly. Diffuse coronary artery and aortic calcifications. No evidence of aortic aneurysm. Right axillofemoral bypass graft noted which is patent. Mediastinum/Nodes: No mediastinal, hilar, or axillary adenopathy. Lungs/Pleura: Moderate bilateral pleural effusions. No confluent airspace opacities or pneumothorax. Musculoskeletal: Fracture noted through the posterior left twelfth rib. This appears  chronic as the bone margins are well corticated. Probable old fracture through  the lateral seventh rib. No definite acute bony abnormality. CT ABDOMEN PELVIS FINDINGS Hepatobiliary: No focal hepatic abnormality. Gallbladder unremarkable. Pancreas: No focal abnormality or ductal dilatation. Spleen: No focal abnormality.  Normal size. Adrenals/Urinary Tract: No adrenal abnormality. No focal renal abnormality. No stones or hydronephrosis. Urinary bladder is unremarkable. Stomach/Bowel: Descending colonic and sigmoid diverticulosis. No active diverticulitis. Stomach, large and small bowel decompressed, otherwise grossly unremarkable. 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 There appears to be and included aorto femoral bypass graft. Fem-fem crossover bypass graft is noted and is patent. Right axillofemoral bypass graft also noted, patent. No evidence of aortic aneurysm. No adenopathy. Reproductive: Prior hysterectomy.  No adnexal masses. Other: No free fluid or free air. Musculoskeletal: No acute bony abnormality or focal bone lesion. IMPRESSION: No evidence of significant traumatic injury in the chest, abdomen or pelvis. Old appearing right lateral seventh rib fracture and left posterior twelfth rib fracture. No definite acute fracture. Moderate bilateral pleural effusions. Cardiomegaly, coronary artery disease. Aortic atherosclerosis. 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 Patent right axillofemoral bypass and fem-fem crossover bypass graft. Left colonic diverticulosis. Electronically Signed   By: Charlett Nose M.D.   On: 04/29/2016 09:14   Dg Hip Unilat W Or Wo Pelvis 2-3 Views Right  Result Date: 04/25/2016 CLINICAL DATA:  Fall with pain EXAM: DG HIP (WITH OR WITHOUT PELVIS) 2-3V RIGHT COMPARISON:  None. FINDINGS: Surgical clips over the bilateral groins. No acute fracture or dislocation. Pubic symphysis appears intact. SI joints are symmetric. Vascular  calcifications in the pelvis and right groin. IMPRESSION: No radiographic evidence for acute osseous abnormality Electronically Signed   By: Jasmine Pang M.D.   On: 04/25/2016 22:32   Dg Femur, Min 2 Views Right  Result Date: 04/29/2016 CLINICAL DATA:  Larey Seat.  Right hip pain. EXAM: RIGHT FEMUR 2 VIEWS COMPARISON:  Same day FINDINGS: Previous total knee arthroplasty. No trauma seen in that region. No more proximal femoral fracture. Right hemipelvis as seen appears negative. IMPRESSION: Negative Electronically Signed   By: Paulina Fusi M.D.   On: 04/29/2016 06:27   Ct Maxillofacial Wo Contrast  Result Date: 04/04/2016 CLINICAL DATA:  Fall from ladder yesterday and again falling this morning EXAM: CT HEAD WITHOUT CONTRAST CT MAXILLOFACIAL WITHOUT CONTRAST CT CERVICAL SPINE WITHOUT CONTRAST TECHNIQUE: Multidetector CT imaging of the head, cervical spine, and maxillofacial structures were performed using the standard protocol without intravenous contrast. Multiplanar CT image reconstructions of the cervical spine and maxillofacial structures were also generated. COMPARISON:  04/03/2016 FINDINGS: CT HEAD FINDINGS Brain: No evidence of acute infarction, hemorrhage, hydrocephalus, extra-axial collection or mass lesion/mass effect. Diffuse atrophic changes and chronic white matter ischemic changes are again noted. Vascular: No hyperdense vessel or unexpected calcification. Skull: Normal. Negative for fracture or focal lesion. Other: None. CT MAXILLOFACIAL FINDINGS Osseous: No fracture or mandibular dislocation. Some lucency is noted about the left central mandibular incisor and left mandibular canine similar to that noted on the prior exam. Some destruction of the left mandibular canine is noted also stable from the prior exam likely related to dental caries. Multiple missing teeth are seen. Dental caries are noted within the maxillary molars on the left and maxillary molar on the right. These changes are stable  from the prior exam. Orbits: Negative. No traumatic or inflammatory finding. Sinuses: Clear. Soft tissues: Mild soft tissue swelling is also noted in the area of the lip stable from the prior exam. No focal hematoma is seen. CT CERVICAL  SPINE FINDINGS Alignment: Cervical alignment is within normal limits. Skull base and vertebrae: 7 cervical segments are well visualized. Some degenerative changes are noted at the articulation of the odontoid and C1. Multilevel facet hypertrophic changes are seen. No acute fracture or acute facet abnormality is noted. Soft tissues and spinal canal: No prevertebral fluid or swelling. No visible canal hematoma. Disc levels: Disc space narrowing is noted at C6-7 with associated osteophytes. Very mild osteophytes are noted at multiple levels. Only mild neural foraminal narrowing is noted at C6-7 bilaterally. Upper chest: Within normal limits. IMPRESSION: CT of the head: Chronic atrophic and ischemic changes similar to that seen on the prior exam. CT of maxillofacial bones: Multiple dental caries as well as lucency surrounding the mandibular central incisor and canine similar to that noted on the prior exam. No acute fracture is seen. CT of the cervical spine: Multilevel degenerative change without acute abnormality. Electronically Signed   By: Alcide Clever M.D.   On: 04/04/2016 10:06     CBC  Recent Labs Lab 04/27/16 1017 04/29/16 0446 05/02/16 1643 05/02/16 1644  WBC 10.9* 11.5* 10.8*  --   HGB 12.0 12.3 13.4  --   HCT 37.2 39.1 42.4  --   PLT 229 239 261 198  MCV 85.5 85.7 86.0  --   MCH 27.6 27.0 27.2  --   MCHC 32.3 31.5 31.6  --   RDW 16.5* 16.6* 16.7*  --   LYMPHSABS 0.9 1.2 1.2  --   MONOABS 1.3* 1.4* 1.1*  --   EOSABS 0.1 0.4 0.3  --   BASOSABS 0.0 0.0 0.0  --     Chemistries   Recent Labs Lab 04/27/16 1017 04/29/16 0446 05/02/16 1643  NA 136 139 135  K 4.0 3.7 4.4  CL 100* 101 100*  CO2 26 27 26   GLUCOSE 131* 128* 107*  BUN 20 15 16    CREATININE 1.08* 0.98 1.01*  CALCIUM 9.0 9.3 9.2  AST 27  --  32  ALT 15  --  16  ALKPHOS 113  --  136*  BILITOT 2.0*  --  1.7*   ------------------------------------------------------------------------------------------------------------------ estimated creatinine clearance is 30.2 mL/min (by C-G formula based on SCr of 1.01 mg/dL (H)). ------------------------------------------------------------------------------------------------------------------ No results for input(s): HGBA1C in the last 72 hours. ------------------------------------------------------------------------------------------------------------------ No results for input(s): CHOL, HDL, LDLCALC, TRIG, CHOLHDL, LDLDIRECT in the last 72 hours. ------------------------------------------------------------------------------------------------------------------ No results for input(s): TSH, T4TOTAL, T3FREE, THYROIDAB in the last 72 hours.  Invalid input(s): FREET3 ------------------------------------------------------------------------------------------------------------------ No results for input(s): VITAMINB12, FOLATE, FERRITIN, TIBC, IRON, RETICCTPCT in the last 72 hours.  Coagulation profile  Recent Labs Lab 04/29/16 0446 05/02/16 1644  INR 1.69 1.91     Recent Labs  05/02/16 1644  DDIMER 3.99*    Cardiac Enzymes No results for input(s): CKMB, TROPONINI, MYOGLOBIN in the last 168 hours.  Invalid input(s): CK ------------------------------------------------------------------------------------------------------------------ Invalid input(s): POCBNP   CBG:  Recent Labs Lab 04/29/16 0437  GLUCAP 105*       Studies: Dg Chest 2 View  Result Date: 05/02/2016 CLINICAL DATA:  Leg swelling EXAM: CHEST  2 VIEW COMPARISON:  04/29/2016 FINDINGS: Cardiomegaly is noted. Status post median sternotomy. Mild interstitial prominence bilaterally suspicious for mild interstitial edema. Trace bilateral pleural  effusion with basilar atelectasis. No segmental infiltrate. Thoracic spine osteopenia. IMPRESSION: Cardiomegaly. Mild interstitial prominence bilateral suspicious for mild interstitial edema. No segmental infiltrate. Trace bilateral pleural effusion with bilateral basilar atelectasis. Electronically Signed   By: Lanette Hampshire.D.  On: 05/02/2016 17:20   Dg Tibia/fibula Right  Result Date: 05/02/2016 CLINICAL DATA:  Fall with leg swelling and purulent drainage. EXAM: RIGHT TIBIA AND FIBULA - 2 VIEW COMPARISON:  None. FINDINGS: Nonspecific soft tissue swelling about the leg. There are changes of venous harvesting in the calf. Total knee arthroplasty which is located. Osteopenia. No acute fracture or subluxation. IMPRESSION: Soft tissue swelling without acute osseous finding. Electronically Signed   By: Marnee Spring M.D.   On: 05/02/2016 17:22   Ct Head Wo Contrast  Result Date: 05/02/2016 CLINICAL DATA:  Multiple recent falls. EXAM: CT HEAD WITHOUT CONTRAST TECHNIQUE: Contiguous axial images were obtained from the base of the skull through the vertex without intravenous contrast. COMPARISON:  04/29/2016 FINDINGS: Brain: There is no evidence for acute hemorrhage, hydrocephalus, mass lesion, or abnormal extra-axial fluid collection. No definite CT evidence for acute infarction. Diffuse loss of parenchymal volume is consistent with atrophy. Patchy low attenuation in the deep hemispheric and periventricular white matter is nonspecific, but likely reflects chronic microvascular ischemic demyelination. Vascular: Atherosclerotic calcification is visualized in the carotid arteries. No dense MCA sign. Major dural sinuses are unremarkable. Skull: No evidence for fracture. No worrisome lytic or sclerotic lesion. Sinuses/Orbits: The visualized paranasal sinuses and mastoid air cells are clear. Visualized portions of the globes and intraorbital fat are unremarkable. Other: Scalp contusion identified high right parietal  region. IMPRESSION: 1. No acute intracranial abnormality. 2. Atrophy with chronic small vessel white matter ischemic disease. 3. Scalp contusion high right parietal region. Electronically Signed   By: Kennith Center M.D.   On: 05/02/2016 18:03      Lab Results  Component Value Date   HGBA1C 8.2 (H) 10/03/2015   HGBA1C 5.4 10/08/2012   HGBA1C 6.9 (H) 05/31/2012   Lab Results  Component Value Date   LDLCALC 101 (H) 10/03/2015   CREATININE 1.01 (H) 05/02/2016       Scheduled Meds: . apixaban  2.5 mg Oral BID  . aspirin EC  81 mg Oral Daily  . busPIRone  15 mg Oral BID  . calcium-vitamin D  1 tablet Oral BID  . Chlorhexidine Gluconate Cloth  6 each Topical Q0600  . doxycycline  100 mg Oral Q12H  . famotidine  20 mg Oral QHS  . furosemide  20 mg Oral Daily  . levothyroxine  37.5 mcg Oral QAC breakfast  . metoprolol succinate  25 mg Oral QHS  . mupirocin ointment  1 application Nasal BID  . omega-3 acid ethyl esters  2 g Oral Daily  . PARoxetine  30 mg Oral Daily  . polyvinyl alcohol  1 drop Both Eyes QID  . ranolazine  500 mg Oral BID  . rivastigmine  4.6 mg Transdermal Daily  . senna  1 tablet Oral QHS  . simvastatin  10 mg Oral QHS  . sodium chloride flush  3 mL Intravenous Q12H  . sodium chloride flush  3 mL Intravenous Q12H  . traZODone  75 mg Oral QHS   Continuous Infusions:   LOS: 0 days    Time spent: >30 MINS    [facility]  [facility] Pager [phone]. If 7PM-7AM, please contact night-coverage at [email or web address], password [facility] 05/03/2016, 9:13 AM  LOS: 0 days

## 2016-05-03 NOTE — Progress Notes (Signed)
Clinical Social Worker met patient at bedside to offer support and discuss patients needs at discharge. Patients daughter (POA) Neoma Laming stated that she wanted patient to dc to [facility]. Neoma Laming stated she did not think that [name] would be able to provide the care patient needed from this point forward. Neoma Laming stated she did not feel comfortable with patient discharging back to facility because patient had been falling recently at facility. Neoma Laming stated that she prefers [facility] facility. CSW sent referral to [facility] on behalf of the patient. Patient and family aware of possible discharge tomorrow. Daughter encourage to find back up for patient discharge in case [facility] is unable to take patient.   Rhea Pink, MSW,  [place]

## 2016-05-03 NOTE — Progress Notes (Signed)
UNABLE TO ASSESS PT FOR FLU AND PNU VACCING D/T PT BEING CONFUSED.

## 2016-05-03 NOTE — Consult Note (Signed)
Patient name: [name] Short Eichholz MRN: [id number] DOB: 11/20/1927 Sex: female    HPI: [name] Short Kimple is a 82 y.o. female seen in consultation with concern for possible ischemia of his lower extremities. She is well-known to our practice with a prior aortobifemoral bypass by Dr. Hart RochesterLawson in 1994. This subsequently occluded and she underwent right axillofemoral and femoral-femoral bypass in May 2014. She's not been seen in our office for approximately a year and a half at that time had patent bypass. She has had progressive dementia and lives in a nursing facility. She presented to the [facility] emergency room yesterday with complaint of leg swelling. Difficult to obtain whether she is having pain. She is able to give no history of this morning. She is completely disoriented. Think she is 82 years old. Does not know where she is. Is unable to answer any other questions. Denies pain.  Past Medical History:  Diagnosis Date  . Anginal pain (HCC)    not in in last month  . Anxiety   . Arthritis    "hands" (10/03/2015)  . Atrial fibrillation (HCC)   . CAD (coronary artery disease) 03/01/2008   Lexiscan - EF 79, no ECG changes/EKG negative for ischemia, post-stress EF 86  . Cerebral atherosclerosis 10/15/2011   Extracranial Examination - mild-moderate amount smooth mixed density plaque elevating velocities within the proximal and mid segments of the internal carotid artery  . Chest pain 10/07/2009   2D Echo - EF 50-55, mitral valve calcified annulus  . CHF (congestive heart failure) (HCC)   . Chronic bronchitis (HCC)   . Claudication (HCC) 09/01/2010   LEA Doppler - Bilateral ABIs-demonstrated moderate arterial insufficiency to the lower extremities at rest, Right CIA/EIA-known occlusive disease, PV Cath  . Complication of anesthesia    "09/29/2015 put to sleep to take out wisdom tooth; took >12h to get her awake" (10/03/2015  . Coronary artery disease   . Depression   . Dyspnea 06/01/2012   2D Echo - EF 60-65,  mitral valve calcified annulus, left atrium severely dilated, right atrium moderately dilated  . GERD (gastroesophageal reflux disease)    takes prevacid  . High cholesterol   . Hypertension   . Hypothyroidism   . Memory loss   . Myocardial infarction   . PVD (peripheral vascular disease) (HCC)   . Stroke [facility](HCC) ?10/02/2015  . Thyroid disease   . Type II diabetes mellitus (HCC)   . Vascular dementia     Family History  Problem Relation Age of Onset  . Heart disease Father   . Hypertension Father   . Heart attack Father   . Kidney failure Brother   . Hyperlipidemia Daughter     SOCIAL HISTORY: Social History  Substance Use Topics  . Smoking status: Former Smoker    Packs/day: 1.00    Years: 50.00    Types: Cigarettes    Quit date: 05/15/1991  . Smokeless tobacco: Never Used  . Alcohol use No    Allergies  Allergen Reactions  . Codeine Itching and Nausea And Vomiting    Current Facility-Administered Medications  Medication Dose Route Frequency Provider Last Rate Last Dose  . 0.9 %  sodium chloride infusion  250 mL Intravenous PRN Briscoe Deutscherimothy S Opyd, MD      . acetaminophen (TYLENOL) tablet 500 mg  500 mg Oral Q4H PRN Briscoe Deutscherimothy S Opyd, MD      . alum & mag hydroxide-simeth (MAALOX/MYLANTA) 200-200-20 MG/5ML suspension 30 mL  30 mL Oral Q6H PRN  Lavone Neri Opyd, MD      . apixaban Everlene Balls) tablet 2.5 mg  2.5 mg Oral BID Briscoe Deutscher, MD   2.5 mg at 05/03/16 0059  . aspirin EC tablet 81 mg  81 mg Oral Daily Timothy S Opyd, MD      . busPIRone (BUSPAR) tablet 15 mg  15 mg Oral BID Briscoe Deutscher, MD      . calcium-vitamin D (OSCAL WITH D) 500-200 MG-UNIT per tablet 1 tablet  1 tablet Oral BID Briscoe Deutscher, MD      . Chlorhexidine Gluconate Cloth 2 % PADS 6 each  6 each Topical Q0600 Lavone Neri Opyd, MD      . doxycycline (VIBRA-TABS) tablet 100 mg  100 mg Oral Q12H Lavone Neri Opyd, MD   100 mg at 05/02/16 0100  . famotidine (PEPCID) tablet 20 mg  20 mg Oral QHS Briscoe Deutscher, MD    20 mg at 05/03/16 0059  . furosemide (LASIX) tablet 20 mg  20 mg Oral Daily Lavone Neri Opyd, MD      . guaifenesin (ROBITUSSIN) 100 MG/5ML syrup 200 mg  200 mg Oral Q6H PRN Briscoe Deutscher, MD      . HYDROcodone-acetaminophen (NORCO/VICODIN) 5-325 MG per tablet 1-2 tablet  1-2 tablet Oral Q6H PRN Lavone Neri Opyd, MD      . ipratropium-albuterol (DUONEB) 0.5-2.5 (3) MG/3ML nebulizer solution 3 mL  3 mL Nebulization Q6H PRN Lavone Neri Opyd, MD      . levothyroxine (SYNTHROID, LEVOTHROID) tablet 37.5 mcg  37.5 mcg Oral QAC breakfast Lavone Neri Opyd, MD      . magnesium hydroxide (MILK OF MAGNESIA) suspension 30 mL  30 mL Oral QHS PRN Briscoe Deutscher, MD      . metoprolol succinate (TOPROL-XL) 24 hr tablet 25 mg  25 mg Oral QHS Lavone Neri Opyd, MD   25 mg at 05/03/16 0100  . mupirocin ointment (BACTROBAN) 2 % 1 application  1 application Nasal BID Lavone Neri Opyd, MD      . omega-3 acid ethyl esters (LOVAZA) capsule 2 g  2 g Oral Daily Timothy S Opyd, MD      . ondansetron (ZOFRAN) tablet 4 mg  4 mg Oral Q6H PRN Briscoe Deutscher, MD       Or  . ondansetron (ZOFRAN) injection 4 mg  4 mg Intravenous Q6H PRN Lavone Neri Opyd, MD      . PARoxetine (PAXIL) tablet 30 mg  30 mg Oral Daily Lavone Neri Opyd, MD      . polyvinyl alcohol (LIQUIFILM TEARS) 1.4 % ophthalmic solution 1 drop  1 drop Both Eyes QID Briscoe Deutscher, MD   1 drop at 05/03/16 0100  . ranolazine (RANEXA) 12 hr tablet 500 mg  500 mg Oral BID Briscoe Deutscher, MD   500 mg at 05/03/16 0059  . rivastigmine (EXELON) 4.6 mg/24hr 4.6 mg  4.6 mg Transdermal Daily Briscoe Deutscher, MD      . senna (SENOKOT) tablet 8.6 mg  1 tablet Oral QHS Lavone Neri Opyd, MD      . simvastatin (ZOCOR) tablet 10 mg  10 mg Oral QHS Timothy S Opyd, MD      . sodium chloride flush (NS) 0.9 % injection 3 mL  3 mL Intravenous Q12H Timothy S Opyd, MD      . sodium chloride flush (NS) 0.9 % injection 3 mL  3 mL Intravenous Q12H Briscoe Deutscher, MD      .  sodium chloride flush (NS) 0.9 %  injection 3 mL  3 mL Intravenous PRN Briscoe Deutscherimothy S Opyd, MD      . traZODone (DESYREL) tablet 75 mg  75 mg Oral QHS Briscoe Deutscherimothy S Opyd, MD   75 mg at 05/03/16 0100    REVIEW OF SYSTEMS:  Reviewed in her history and physical with nothing to add PHYSICAL EXAM: Vitals:   05/02/16 2154 05/02/16 2258 05/03/16 0100 05/03/16 0459  BP: 114/81 (!) 121/56  110/74  Pulse: 107 (!) 59  (!) 53  Resp: 20 20  20   Temp: 98.1 F (36.7 C) 98 F (36.7 C)  98.6 F (37 C)  TempSrc: Oral Oral  Oral  SpO2: 93% (!) 80% 92% 96%  Weight:    118 lb 9.6 oz (53.8 kg)    GENERAL: The patient is a well-nourished female, in no acute distress. The vital signs are documented above. VASCULAR: Easily palpable accident and femorofemoral bypass pulses. Some superficial excoriation over her right pretibial area. Biphasic anterior tibial signals at the ankle by Doppler bilaterally PULMONARY: There is good air exchange ABDOMEN: Soft and non-tender MUSCULOSKELETAL: There are no major deformities or cyanosis. NEUROLOGIC: No focal weakness or paresthesias are detected. SKIN: Dressings over excoriated areas pretibial the right PSYCHIATRIC: The patient has a normal affect.    MEDICAL ISSUES:  No evidence of lower 70 ischemia. Patent axillofemoral and femorofemoral bypasses. Good Doppler flow to her feet. Will not follow actively. Please call if we can assist.    Arash Karstens [facility] [place] [place] [employer] [phone]

## 2016-05-03 NOTE — Progress Notes (Addendum)
Paged [email or web address] np on call results of lactic acid 2.1. Awaiting call back or orders. monitoring will continue.

## 2016-05-03 NOTE — Progress Notes (Signed)
Pt daughter at bedside, states that pt was supposed to be seen at a f/u appt at orthopedic clinic today s/p R arm fx.  Sees Dr. Madelon Lipsaffrey [address] [phone].  She requests ortho consult since pt will now not be able to be seen in office for at least another week.  Dr. Susie CassetteAbrol notified.  States that she has passed pt's info on to [facility]'s clinic.

## 2016-05-03 NOTE — Consult Note (Addendum)
CONSULT NOTE  Date: 05/03/2016               Patient Name:  [name] Short MRN: [id number]  DOB: 05/11/1928 Age / Sex: 82 y.o., female        PCP: MAZZOCCHI, Rise Mu Primary Cardiologist: Allyson Sabal            Referring Physician: Susie Cassette               Reason for Consult: CHF           History of Present Illness: Patient is a 82 y.o. female with a PMHx of Dementia, peripheral arterial disease, coronary artery disease-status post coronary artery bypass grafting, chronic diastolic congestive heart failure, atrial fibrillation, who was admitted to [facility] on 05/02/2016 for evaluation of large empty edema and foot pain.  The history is limited because of her dementia.  She's had recurrent falls and has scattered bruises and abrasions on her legs..   Echocardiogram in May, 2017 revealed normal left ventricular systolic motion. Her ejection fractions 55-60%. She has trivial aortic insufficiency, moderate to severe mitral regurgitation, severe left and right  atrial enlargement and moderate tricuspid regurgitation. She has moderate pulmonary artery hypertension with an estimated PA pressure of 52 mmHg.  Repeat echocardiogram is pending.    The patient is unable to give me any history. I got the history from her daughter.  She's had a progressive decline for the past several weeks. She does not walked since November. She's been eating poorly. Her dementia has gotten worse.  She complains of intermittent chest pain.     Medications: Outpatient medications: Prescriptions Prior to Admission  Medication Sig Dispense Refill Last Dose  . acetaminophen (TYLENOL) 325 MG tablet Take 650 mg by mouth 4 (four) times daily.    05/02/2016 at Unknown time  . acetaminophen (TYLENOL) 500 MG tablet Take 500 mg by mouth every 4 (four) hours as needed for mild pain, moderate pain, fever or headache.   unknown  . alum & mag hydroxide-simeth (MINTOX) 200-200-20 MG/5ML suspension Take 30 mLs by mouth every 6  (six) hours as needed for indigestion or heartburn.   unknown  . amoxicillin (AMOXIL) 500 MG capsule Take 500 mg by mouth 2 (two) times daily.   05/02/2016 at Unknown time  . apixaban (ELIQUIS) 2.5 MG TABS tablet Take 1 tablet (2.5 mg total) by mouth 2 (two) times daily. 60 tablet 0 05/02/2016 at 0800  . aspirin EC 81 MG tablet Take 81 mg by mouth daily.   05/02/2016 at Unknown time  . busPIRone (BUSPAR) 15 MG tablet Take 15 mg by mouth 2 (two) times daily.   05/02/2016 at Unknown time  . calcium-vitamin D (OSCAL WITH D) 500-200 MG-UNIT tablet Take 1 tablet by mouth 2 (two) times daily.   05/02/2016 at Unknown time  . cetirizine (ZYRTEC) 10 MG tablet Take 10 mg by mouth daily.   05/02/2016 at Unknown time  . diphenhydrAMINE-zinc acetate (BENADRYL) cream Apply 1 application topically 2 (two) times daily.   05/02/2016 at Unknown time  . famotidine (PEPCID) 20 MG tablet Take 20 mg by mouth at bedtime.    05/01/2016 at Unknown time  . furosemide (LASIX) 20 MG tablet Take 20 mg by mouth daily.   05/02/2016 at Unknown time  . guaifenesin (ROBITUSSIN) 100 MG/5ML syrup Take 200 mg by mouth every 6 (six) hours as needed for cough.    unknown  . HYDROcodone-acetaminophen (NORCO/VICODIN) 5-325 MG tablet Take 0.5 to  1 tablet by mouth every 6 (six) hours as needed for severe pain. 10 tablet 0 04/30/2016  . Ipratropium-Albuterol (COMBIVENT RESPIMAT) 20-100 MCG/ACT AERS respimat Inhale 1 puff into the lungs every 6 (six) hours as needed for wheezing or shortness of breath.    unknown  . levothyroxine (SYNTHROID, LEVOTHROID) 25 MCG tablet Take 37.5 mcg by mouth daily before breakfast.   05/02/2016 at Unknown time  . loperamide (IMODIUM) 2 MG capsule Take 2 mg by mouth as needed for diarrhea or loose stools.    unknown  . losartan (COZAAR) 100 MG tablet Take 100 mg by mouth daily.   05/02/2016 at Unknown time  . magnesium hydroxide (MILK OF MAGNESIA) 400 MG/5ML suspension Take 30 mLs by mouth at bedtime as needed for  mild constipation.    unknown  . metoprolol succinate (TOPROL-XL) 25 MG 24 hr tablet Take 25 mg by mouth at bedtime.   05/01/2016 at 2000  . Neomycin-Bacitracin-Polymyxin (TRIPLE ANTIBIOTIC) [phone] OINT Apply 1 application topically as needed (for minor skin tears/abrasions).   unknown  . nitroGLYCERIN (NITROSTAT) 0.4 MG SL tablet Place 0.4 mg under the tongue every 5 (five) minutes as needed for chest pain.    unknown  . omega-3 acid ethyl esters (LOVAZA) 1 G capsule Take 2 g by mouth daily.   05/02/2016 at Unknown time  . PARoxetine (PAXIL) 30 MG tablet Take 30 mg by mouth daily.   05/02/2016 at Unknown time  . polyvinyl alcohol (LIQUIFILM TEARS) 1.4 % ophthalmic solution Place 1 drop into both eyes 4 (four) times daily.    05/02/2016 at Unknown time  . potassium chloride SA (K-DUR,KLOR-CON) 20 MEQ tablet Take 20 mEq by mouth daily.   05/02/2016 at Unknown time  . ranolazine (RANEXA) 500 MG 12 hr tablet Take 1 tablet (500 mg total) by mouth 2 (two) times daily. 60 tablet 5 05/02/2016 at Unknown time  . rivastigmine (EXELON) 4.6 mg/24hr Place 4.6 mg onto the skin daily.   05/02/2016 at Unknown time  . senna (SENOKOT) 8.6 MG tablet Take 1 tablet by mouth at bedtime.   05/01/2016 at Unknown time  . simvastatin (ZOCOR) 10 MG tablet Take 10 mg by mouth at bedtime.    05/01/2016 at Unknown time  . traZODone (DESYREL) 50 MG tablet Take 75 mg by mouth at bedtime.   05/01/2016 at Unknown time  . albuterol (PROVENTIL HFA;VENTOLIN HFA) 108 (90 BASE) MCG/ACT inhaler Inhale 1-2 puffs into the lungs every 6 (six) hours as needed for wheezing. (Patient not taking: Reported on 04/29/2016) 1 Inhaler 0 Not Taking at Unknown time  . UNABLE TO FIND Take 1 each by mouth 3 (three) times daily. Med Name: Wyline MoodMighty Shakes   04/28/2016 at Unknown time    Current medications: Current Facility-Administered Medications  Medication Dose Route Frequency Provider Last Rate Last Dose  . 0.9 %  sodium chloride infusion  250  mL Intravenous PRN Briscoe Deutscherimothy S Opyd, MD      . acetaminophen (TYLENOL) tablet 500 mg  500 mg Oral Q4H PRN Briscoe Deutscherimothy S Opyd, MD      . alum & mag hydroxide-simeth (MAALOX/MYLANTA) 200-200-20 MG/5ML suspension 30 mL  30 mL Oral Q6H PRN Briscoe Deutscherimothy S Opyd, MD      . apixaban (ELIQUIS) tablet 2.5 mg  2.5 mg Oral BID Briscoe Deutscherimothy S Opyd, MD   2.5 mg at 05/03/16 1049  . aspirin EC tablet 81 mg  81 mg Oral Daily Briscoe Deutscherimothy S Opyd, MD   81 mg at 05/03/16  1049  . busPIRone (BUSPAR) tablet 15 mg  15 mg Oral BID Briscoe Deutscher, MD   15 mg at 05/03/16 1045  . calcium-vitamin D (OSCAL WITH D) 500-200 MG-UNIT per tablet 1 tablet  1 tablet Oral BID Briscoe Deutscher, MD   1 tablet at 05/03/16 1049  . Chlorhexidine Gluconate Cloth 2 % PADS 6 each  6 each Topical Q0600 Briscoe Deutscher, MD   6 each at 05/03/16 0800  . collagenase (SANTYL) ointment   Topical Daily Richarda Overlie, MD      . doxycycline (VIBRA-TABS) tablet 100 mg  100 mg Oral Q12H Lavone Neri Opyd, MD   100 mg at 05/03/16 1049  . famotidine (PEPCID) tablet 20 mg  20 mg Oral QHS Lavone Neri Opyd, MD   20 mg at 05/03/16 0059  . furosemide (LASIX) tablet 20 mg  20 mg Oral Daily Briscoe Deutscher, MD   20 mg at 05/03/16 1047  . guaifenesin (ROBITUSSIN) 100 MG/5ML syrup 200 mg  200 mg Oral Q6H PRN Briscoe Deutscher, MD      . HYDROcodone-acetaminophen (NORCO/VICODIN) 5-325 MG per tablet 1-2 tablet  1-2 tablet Oral Q6H PRN Lavone Neri Opyd, MD      . ipratropium-albuterol (DUONEB) 0.5-2.5 (3) MG/3ML nebulizer solution 3 mL  3 mL Nebulization Q6H PRN Lavone Neri Opyd, MD      . levothyroxine (SYNTHROID, LEVOTHROID) tablet 37.5 mcg  37.5 mcg Oral QAC breakfast Lavone Neri Opyd, MD   37.5 mcg at 05/03/16 1050  . magnesium hydroxide (MILK OF MAGNESIA) suspension 30 mL  30 mL Oral QHS PRN Briscoe Deutscher, MD      . metoprolol succinate (TOPROL-XL) 24 hr tablet 25 mg  25 mg Oral QHS Lavone Neri Opyd, MD   25 mg at 05/03/16 0100  . mupirocin ointment (BACTROBAN) 2 % 1 application  1 application Nasal BID  Briscoe Deutscher, MD   1 application at 05/03/16 1047  . nitroGLYCERIN (NITRODUR - Dosed in mg/24 hr) patch 0.2 mg  0.2 mg Transdermal Daily Richarda Overlie, MD      . omega-3 acid ethyl esters (LOVAZA) capsule 2 g  2 g Oral Daily Briscoe Deutscher, MD   2 g at 05/03/16 1046  . ondansetron (ZOFRAN) tablet 4 mg  4 mg Oral Q6H PRN Briscoe Deutscher, MD       Or  . ondansetron (ZOFRAN) injection 4 mg  4 mg Intravenous Q6H PRN Lavone Neri Opyd, MD      . PARoxetine (PAXIL) tablet 30 mg  30 mg Oral Daily Briscoe Deutscher, MD   30 mg at 05/03/16 1044  . polyvinyl alcohol (LIQUIFILM TEARS) 1.4 % ophthalmic solution 1 drop  1 drop Both Eyes QID Briscoe Deutscher, MD   1 drop at 05/03/16 1101  . ranolazine (RANEXA) 12 hr tablet 500 mg  500 mg Oral BID Briscoe Deutscher, MD   500 mg at 05/03/16 1047  . rivastigmine (EXELON) 4.6 mg/24hr 4.6 mg  4.6 mg Transdermal Daily Lavone Neri Opyd, MD   4.6 mg at 05/03/16 1045  . senna (SENOKOT) tablet 8.6 mg  1 tablet Oral QHS Lavone Neri Opyd, MD      . simvastatin (ZOCOR) tablet 10 mg  10 mg Oral QHS Timothy S Opyd, MD      . sodium chloride flush (NS) 0.9 % injection 3 mL  3 mL Intravenous Q12H Lavone Neri Opyd, MD   3 mL at 05/03/16 1000  .  sodium chloride flush (NS) 0.9 % injection 3 mL  3 mL Intravenous Q12H Timothy S Opyd, MD      . sodium chloride flush (NS) 0.9 % injection 3 mL  3 mL Intravenous PRN Briscoe Deutscher, MD      . traZODone (DESYREL) tablet 75 mg  75 mg Oral QHS Briscoe Deutscher, MD   75 mg at 05/03/16 0100     Allergies  Allergen Reactions  . Codeine Itching and Nausea And Vomiting     Past Medical History:  Diagnosis Date  . Anginal pain (HCC)    not in in last month  . Anxiety   . Arthritis    "hands" (10/03/2015)  . Atrial fibrillation (HCC)   . CAD (coronary artery disease) 03/01/2008   Lexiscan - EF 79, no ECG changes/EKG negative for ischemia, post-stress EF 86  . Cerebral atherosclerosis 10/15/2011   Extracranial Examination - mild-moderate amount smooth  mixed density plaque elevating velocities within the proximal and mid segments of the internal carotid artery  . Chest pain 10/07/2009   2D Echo - EF 50-55, mitral valve calcified annulus  . CHF (congestive heart failure) (HCC)   . Chronic bronchitis (HCC)   . Claudication (HCC) 09/01/2010   LEA Doppler - Bilateral ABIs-demonstrated moderate arterial insufficiency to the lower extremities at rest, Right CIA/EIA-known occlusive disease, PV Cath  . Complication of anesthesia    "09/29/2015 put to sleep to take out wisdom tooth; took >12h to get her awake" (10/03/2015  . Coronary artery disease   . Depression   . Dyspnea 06/01/2012   2D Echo - EF 60-65, mitral valve calcified annulus, left atrium severely dilated, right atrium moderately dilated  . GERD (gastroesophageal reflux disease)    takes prevacid  . High cholesterol   . Hypertension   . Hypothyroidism   . Memory loss   . Myocardial infarction   . PVD (peripheral vascular disease) (HCC)   . Stroke [facility]) ?10/02/2015  . Thyroid disease   . Type II diabetes mellitus (HCC)   . Vascular dementia     Past Surgical History:  Procedure Laterality Date  . ABDOMINAL HYSTERECTOMY    . ANGIOPLASTY Left 02/23/2010   Darcon patch, 80-90% stenosis proximal to the anastomosis  . AORTA - BILATERAL FEMORAL ARTERY BYPASS GRAFT  2011  . AXILLARY-FEMORAL BYPASS GRAFT Right 10/08/2012   Procedure: BYPASS GRAFT AXILLA-BIFEMORAL;  Surgeon: Pryor Ochoa, MD;  Location: [facility] OR;  Service: Vascular;  Laterality: Right;  . CARDIAC CATHETERIZATION  2012  . CARDIAC CATHETERIZATION  06/02/2012   Unchanged anatomy compared to 3 years ago, continue medical therapy  . CARDIAC CATHETERIZATION  10/06/2009   Occluded right coronary artery, left-to-right collateral noncritical left disease with occluded grafts  . CATARACT EXTRACTION W/ INTRAOCULAR LENS  IMPLANT, BILATERAL Bilateral   . CORONARY ANGIOPLASTY    . CORONARY ARTERY BYPASS GRAFT  1993  . EYE SURGERY    .  JOINT REPLACEMENT    . LEFT HEART CATHETERIZATION WITH CORONARY/GRAFT ANGIOGRAM N/A 06/02/2012   Procedure: LEFT HEART CATHETERIZATION WITH Isabel Caprice;  Surgeon: Runell Gess, MD;  Location: [facility] CATH LAB;  Service: Cardiovascular;  Laterality: N/A;  . TOTAL KNEE ARTHROPLASTY Left 2010    Family History  Problem Relation Age of Onset  . Heart disease Father   . Hypertension Father   . Heart attack Father   . Kidney failure Brother   . Hyperlipidemia Daughter     Social History:  reports  that she quit smoking about 24 years ago. Her smoking use included Cigarettes. She has a 50.00 pack-year smoking history. She has never used smokeless tobacco. She reports that she does not drink alcohol or use drugs.   Review of Systems: unable to obtain due to dementia    Physical Exam: BP (!) 145/87 (BP Location: Left Arm)   Pulse 82   Temp 98.6 F (37 C) (Oral)   Resp 20   Wt 118 lb 9.6 oz (53.8 kg) Comment: nurse is aware.  SpO2 96%   BMI 22.41 kg/m   Wt Readings from Last 3 Encounters:  05/03/16 118 lb 9.6 oz (53.8 kg)  04/16/16 101 lb (45.8 kg)  04/04/16 120 lb (54.4 kg)    General:  Chronically ill-appearing, elderly white female lying in bed. She was not able to purchase a patent conversation.   Head: Normocephalic, atraumatic, sclera anicteric,  Neck:   Lungs:   few rales.  No significant wheezing.   Heart: Irregularly irregular. She has 1 to 2/6 systolic murmur.   Abdomen/ GI :  Soft, non-tender, non-distended with normoactive bowel sounds. No hepatomegaly. No rebound/guarding. No obvious abdominal masses   MSK: There is no leg edema. She has mitral. She has several abrasions that are bandaged.   Extremities: No clubbing cyanosis or edema   Neurologic:  She is demented. She does assist with rolling over when asked.   Psych: Demented     Lab results: Basic Metabolic Panel:  Recent Labs Lab 04/27/16 1017 04/29/16 0446 05/02/16 1643  NA 136 139 135  K  4.0 3.7 4.4  CL 100* 101 100*  CO2 26 27 26   GLUCOSE 131* 128* 107*  BUN 20 15 16   CREATININE 1.08* 0.98 1.01*  CALCIUM 9.0 9.3 9.2    Liver Function Tests:  Recent Labs Lab 04/27/16 1017 05/02/16 1643  AST 27 32  ALT 15 16  ALKPHOS 113 136*  BILITOT 2.0* 1.7*  PROT 6.4* 7.3  ALBUMIN 3.5 3.8   No results for input(s): LIPASE, AMYLASE in the last 168 hours. No results for input(s): AMMONIA in the last 168 hours.  CBC:  Recent Labs Lab 04/27/16 1017 04/29/16 0446 05/02/16 1643 05/02/16 1644  WBC 10.9* 11.5* 10.8*  --   NEUTROABS 8.5* 8.5* 8.2*  --   HGB 12.0 12.3 13.4  --   HCT 37.2 39.1 42.4  --   MCV 85.5 85.7 86.0  --   PLT 229 239 261 198    Cardiac Enzymes: No results for input(s): CKTOTAL, CKMB, CKMBINDEX, TROPONINI in the last 168 hours.  BNP: Invalid input(s): POCBNP  CBG:  Recent Labs Lab 04/29/16 0437  GLUCAP 105*    Coagulation Studies:  Recent Labs  05/02/16 1644  LABPROT 22.2*  INR 1.91     Other results: Personal review of EKG shows : Atrial fibrillation with a ventricular rate of 97. She has nonspecific T-wave flattening. Imaging: Dg Chest 2 View  Result Date: 05/02/2016 CLINICAL DATA:  Leg swelling EXAM: CHEST  2 VIEW COMPARISON:  04/29/2016 FINDINGS: Cardiomegaly is noted. Status post median sternotomy. Mild interstitial prominence bilaterally suspicious for mild interstitial edema. Trace bilateral pleural effusion with basilar atelectasis. No segmental infiltrate. Thoracic spine osteopenia. IMPRESSION: Cardiomegaly. Mild interstitial prominence bilateral suspicious for mild interstitial edema. No segmental infiltrate. Trace bilateral pleural effusion with bilateral basilar atelectasis. Electronically Signed   By: Natasha Mead M.D.   On: 05/02/2016 17:20   Dg Tibia/fibula Right  Result Date: 05/02/2016 CLINICAL  DATA:  Fall with leg swelling and purulent drainage. EXAM: RIGHT TIBIA AND FIBULA - 2 VIEW COMPARISON:  None. FINDINGS:  Nonspecific soft tissue swelling about the leg. There are changes of venous harvesting in the calf. Total knee arthroplasty which is located. Osteopenia. No acute fracture or subluxation. IMPRESSION: Soft tissue swelling without acute osseous finding. Electronically Signed   By: Marnee Spring M.D.   On: 05/02/2016 17:22   Ct Head Wo Contrast  Result Date: 05/02/2016 CLINICAL DATA:  Multiple recent falls. EXAM: CT HEAD WITHOUT CONTRAST TECHNIQUE: Contiguous axial images were obtained from the base of the skull through the vertex without intravenous contrast. COMPARISON:  04/29/2016 FINDINGS: Brain: There is no evidence for acute hemorrhage, hydrocephalus, mass lesion, or abnormal extra-axial fluid collection. No definite CT evidence for acute infarction. Diffuse loss of parenchymal volume is consistent with atrophy. Patchy low attenuation in the deep hemispheric and periventricular white matter is nonspecific, but likely reflects chronic microvascular ischemic demyelination. Vascular: Atherosclerotic calcification is visualized in the carotid arteries. No dense MCA sign. Major dural sinuses are unremarkable. Skull: No evidence for fracture. No worrisome lytic or sclerotic lesion. Sinuses/Orbits: The visualized paranasal sinuses and mastoid air cells are clear. Visualized portions of the globes and intraorbital fat are unremarkable. Other: Scalp contusion identified high right parietal region. IMPRESSION: 1. No acute intracranial abnormality. 2. Atrophy with chronic small vessel white matter ischemic disease. 3. Scalp contusion high right parietal region. Electronically Signed   By: Kennith Center M.D.   On: 05/02/2016 18:03   Dg Chest Port 1 View  Result Date: 05/03/2016 CLINICAL DATA:  Chest pain EXAM: PORTABLE CHEST 1 VIEW COMPARISON:  05/02/2016 FINDINGS: Cardiac shadow remains enlarged. Postsurgical changes are again seen. The lungs are well aerated bilaterally. Mild bronchitic changes are seen without  focal confluent infiltrate. No sizable effusion is noted. IMPRESSION: Mild interstitial changes without acute abnormality. Electronically Signed   By: Alcide Clever M.D.   On: 05/03/2016 10:04      Assessment & Plan:  1. Chronic diastolic congestive heart failure:    It's really difficult to say whether she has an acute exacerbation of diastolic congestive heart failure. I think that her symptoms are really more of a generalized failure to thrive. There is no significant leg edema. She does have some mild rales and pulmonary edema on x-ray. She is on Lasix at home. We have a repeat echocardiogram has been ordered.  At this point I do not think there is much more to offer except for conservative therapy. Continue Lasix.  2. CAD:   She has some chest discomfort.   She is not a good candidate for invasive and interventional procedures.  Would treat her medically - add Imdur 30 mg a day , titrate as needed.   3. Atrial fib:   HR is controlled. Her daughter states that she has fallen 5 times and last week or so. At this point I think that we should stop the Eliquis since she is at risk of hitting her head.  Her daughter understands that this will increase her risk of stroke.   She has dementia and cannot remember that she is not able to walk. She has not walked for the past 2 months and so she's developed some atrophy. Further recommendations at this time.  4. Failure to thrive: The patient clearly has failure to thrive. She has worsening of multiple medical issues.    She is a DO NOT RESUSCITATE.  5. HTN:  Continue medical therapy   Alvia GrovePhilip J. Barnard Sharps, Jr., MD, [facility] CountyFACC 05/03/2016, 12:30 PM Office - [phone] Pager [phone]

## 2016-05-03 NOTE — Care Management Obs Status (Signed)
MEDICARE OBSERVATION STATUS NOTIFICATION   Patient Details  Name: [name] Short MRN: [id number] Date of Birth: 13-Nov-1927   Medicare Observation Status Notification Given:  Yes    Darrold SpanWebster, Zeke Aker Hall, RN 05/03/2016, 4:25 PM

## 2016-05-03 NOTE — Progress Notes (Signed)
  Echocardiogram 2D Echocardiogram has been performed.  [name] Short, [name] Short 05/03/2016, 1:32 PM

## 2016-05-03 NOTE — Consult Note (Signed)
WOC Nurse wound consult note Reason for Consult: Right tibial wound Wound type: two unstageables, one partial thickness Pressure Ulcer POA: NA Measurement: midline Right pretibial 2cm x 1.25cm x 0cm (filled in with slough) 100% cream colored slough, mod drainage, no odor noted                        Medial Right tibial  wound 0.5cm x 0.5cm x 0cm (filled in with slough) 100% cream colored slough, moderate drainage, no odor noted                        Distal right tibial 1.5cm x 0.25 x 0.1cm 100% pink bed, small amt drainage, no odor noted. Wound bed:see above Drainage (amount, consistency, odor) see above Periwound: reddened < 1cm Dressing procedure/placement/frequency: I have provided nurses with orders for: To the two proximal right tibial wounds cleanse with NS, pat dry, apply Santyl, cover with NS moistened gauze, cover with dry gauze and secure daily. (Do not apply Santyl to the distal scratched wound closer to ankle) Cover distal partial thickness wound with foam dressing. We will not follow, but will remain available to this patient, to nursing, and the medical and/or surgical teams.  Please re-consult if we need to assist further.    Barnett HatterMelinda Perez Dirico, RN-C, WTA-C Wound Treatment Associate

## 2016-05-04 ENCOUNTER — Observation Stay (HOSPITAL_COMMUNITY): Payer: Medicare Other

## 2016-05-04 DIAGNOSIS — I4891 Unspecified atrial fibrillation: Secondary | ICD-10-CM | POA: Diagnosis not present

## 2016-05-04 DIAGNOSIS — L03115 Cellulitis of right lower limb: Secondary | ICD-10-CM

## 2016-05-04 DIAGNOSIS — I5033 Acute on chronic diastolic (congestive) heart failure: Secondary | ICD-10-CM | POA: Diagnosis not present

## 2016-05-04 DIAGNOSIS — I739 Peripheral vascular disease, unspecified: Secondary | ICD-10-CM

## 2016-05-04 DIAGNOSIS — R600 Localized edema: Secondary | ICD-10-CM

## 2016-05-04 DIAGNOSIS — E1151 Type 2 diabetes mellitus with diabetic peripheral angiopathy without gangrene: Secondary | ICD-10-CM | POA: Diagnosis not present

## 2016-05-04 DIAGNOSIS — I998 Other disorder of circulatory system: Secondary | ICD-10-CM | POA: Diagnosis not present

## 2016-05-04 LAB — URINALYSIS, ROUTINE W REFLEX MICROSCOPIC
BILIRUBIN URINE: NEGATIVE
GLUCOSE, UA: NEGATIVE mg/dL
Hgb urine dipstick: NEGATIVE
KETONES UR: NEGATIVE mg/dL
NITRITE: NEGATIVE
PH: 7 (ref 5.0–8.0)
Protein, ur: NEGATIVE mg/dL
SPECIFIC GRAVITY, URINE: 1.006 (ref 1.005–1.030)
Squamous Epithelial / HPF: NONE SEEN

## 2016-05-04 LAB — TROPONIN I: Troponin I: 0.03 ng/mL (ref <0.03)

## 2016-05-04 LAB — COMPREHENSIVE METABOLIC PANEL
ALBUMIN: 2.9 g/dL — AB (ref 3.5–5.0)
ALK PHOS: 103 U/L (ref 38–126)
ALT: 12 U/L — AB (ref 14–54)
AST: 26 U/L (ref 15–41)
Anion gap: 8 (ref 5–15)
BILIRUBIN TOTAL: 1.3 mg/dL — AB (ref 0.3–1.2)
BUN: 11 mg/dL (ref 6–20)
CO2: 27 mmol/L (ref 22–32)
CREATININE: 0.98 mg/dL (ref 0.44–1.00)
Calcium: 8.4 mg/dL — ABNORMAL LOW (ref 8.9–10.3)
Chloride: 98 mmol/L — ABNORMAL LOW (ref 101–111)
GFR calc Af Amer: 59 mL/min — ABNORMAL LOW (ref >60)
GFR, EST NON AFRICAN AMERICAN: 51 mL/min — AB (ref >60)
GLUCOSE: 102 mg/dL — AB (ref 65–99)
Potassium: 3.4 mmol/L — ABNORMAL LOW (ref 3.5–5.1)
Sodium: 133 mmol/L — ABNORMAL LOW (ref 135–145)
TOTAL PROTEIN: 5.4 g/dL — AB (ref 6.5–8.1)

## 2016-05-04 LAB — CBC
HCT: 35.1 % — ABNORMAL LOW (ref 36.0–46.0)
Hemoglobin: 11 g/dL — ABNORMAL LOW (ref 12.0–15.0)
MCH: 26.8 pg (ref 26.0–34.0)
MCHC: 31.3 g/dL (ref 30.0–36.0)
MCV: 85.6 fL (ref 78.0–100.0)
PLATELETS: 193 10*3/uL (ref 150–400)
RBC: 4.1 MIL/uL (ref 3.87–5.11)
RDW: 16.7 % — AB (ref 11.5–15.5)
WBC: 10.1 10*3/uL (ref 4.0–10.5)

## 2016-05-04 LAB — GLUCOSE, CAPILLARY: Glucose-Capillary: 87 mg/dL (ref 65–99)

## 2016-05-04 MED ORDER — HYDROCODONE-ACETAMINOPHEN 5-325 MG PO TABS: ORAL_TABLET | ORAL | 0 refills | Status: AC

## 2016-05-04 MED ORDER — DOXYCYCLINE HYCLATE 100 MG PO TABS: 100.0000 mg | ORAL_TABLET | Freq: Two times a day (BID) | ORAL | 0 refills | Status: AC

## 2016-05-04 MED ORDER — ISOSORBIDE MONONITRATE ER 30 MG PO TB24: 30.0000 mg | ORAL_TABLET | Freq: Every day | ORAL | 0 refills | Status: AC

## 2016-05-04 NOTE — Progress Notes (Signed)
CSW met patient and daughter at bedside to inform them of discharge plans. Patient's daughter is aware that if patient is unable to go to [place] she will have to discharge back to [facility]. Patient's daughter is looking into other facility as a back up. CSW remains available for support and discharge needs.  Rhea Pink, MSW,  [place]

## 2016-05-04 NOTE — Progress Notes (Signed)
PROGRESS NOTE  [name] SharpsMary Siegman ZDG:[id number] DOB: 02-09-1928 DOA: 05/02/2016 PCP: MAZZOCCHI, Rise MuANNMARIE, MD   LOS: 0 days   Brief Narrative: 82 y.o.femalewith medical history significant for vascular dementia, coronary artery disease s/p CABG, PAD status-post bifemoral bypass, chronic diastolic CHF, and atrial fibrillation on Eliquis, now presenting from her SNF for evaluation of bilateral lower extremity edema and foot pain.Patient's bilateral feet were noted to be cold and pulses could not be identified with palpation or with Doppler. Vascular surgery was consulted by the ED physician and [facility] for further evaluation and management  Assessment & Plan: Principal Problem:   Ischemic foot Active Problems:   Diabetes mellitus type II, controlled (HCC)   Atrial fibrillation with controlled ventricular response (HCC)   CAD (coronary artery disease) of artery bypass graft   Essential hypertension   Cellulitis   PAD (peripheral artery disease) (HCC)   Acute on chronic diastolic CHF (congestive heart failure) (HCC)   Cellulitis of right lower extremity   Leg edema    Bilateral painful feet - Pt has hx of PAD with bifemoral bypass grating in 2014  - She presents from SNF for BLE edema, reportedly developing over the past day notes that there had been severe pain in both feet - Vascular surgery was consulted -Patent axillofemoral and femorofemoral bypasses. Good Doppler flow to her feet, no evidence of ischemia, vascular has signed off  Right lower leg wound with cellulitis - Pt has two wounds on anterior right shin, the smaller one with expression of pus and surrounding erythema/edema  - Radiographs feature soft-tissue swelling, but no acute bony abnormality  - There is minimal leukocytosis, but no fever or end-organ damage  - She was treated with IV clindamycin in ED, plan to continue doxycycline BID for now for 7 additional days - Wound care  consulted  Abdominal pain - new onset today, states is diffuse - obtain plain abdominal films, monitor  Acute on chronic diastolic CHF  - TTE (10/03/15) with EF 55-60%, mod-sev MR, mod TR, and severe LAE  - Pt presents with increased BLE edema and mild interstitial edema is noted on CXR  - She is saturating well on rm air and not in any respiratory distress  - Continue Lasix, metoprolol, cardiology consulted and signed off 10/22  Atrial fibrillation  - Pt is in rate-controlled a fib on admission  - CHADS-VASc ~8 (age x2, hx CVA x2, CHF, CAD, HTN, gender)  - She is anticoagulated with Eliquis, but has suffered numerous falls in the past few months at the nursing home and presents with bruising and scrapes throughout; may need to re-weigh risk/benefit. Cards recommending d/c Eliquis, will d/c on discharge  CAD - chest pain resolved.  - Pt is s/p CABG with occluded grafts on subsequent cath  - Continue ASA 81, Toprol, Lovaza as tolerated; hold losartan for now while diuresing, resume as appropriate   Hypertension  - Managed at home with Toprol and losartan - As above, Toprol has been continued and losartan held on admission   Recent enterococcal urinary tract infection - Repeat UA negative  Possible radial neck fracture  - XR 12/15 Irregularity of the radial neck could be due to degenerative spurring versus nondisplaced fracture -follow with ortho outpatient, discussed with Dr. Madelon Lipsaffrey with whom patient was supposed to follow up yesterday, no acute needs  Dispo: patient currently lives in memory unit without SNF level care, she has had several falls recently, suspect that she  will need higher level of care on d/c and currently I am concerned about patient's safety if she returns to same environment. D/w SW, CM. Has not had a PT evaluation yet, have placed the order today  DVT prophylaxis: Eliquis Code Status: DNR Family Communication: d/w daughter bedside and over the phone in  am  Disposition Plan: TBD. No safe d/c planing as she lives in a memory unit with little to no assistance and will need formal PT evaluation to determine best avenue.  Consultants:   Cardiology   Vascular surgery   Procedures:   None   Antimicrobials:  None    Subjective: - no chest pain, shortness of breath - complains of diffuse abdominal pain, new onset today   Objective: Vitals:   05/03/16 1212 05/03/16 1422 05/03/16 2134 05/04/16 0330  BP: (!) 145/87 140/87 (!) 151/92 (!) 162/89  Pulse: 82 88 99 88  Resp:  18 16 16   Temp:  97.4 F (36.3 C) 98 F (36.7 C) 97.6 F (36.4 C)  TempSrc:  Oral Oral Oral  SpO2: 96%  99% 99%  Weight:    52.3 kg (115 lb 6.4 oz)    Intake/Output Summary (Last 24 hours) at 05/04/16 1429 Last data filed at 05/03/16 2308  Gross per 24 hour  Intake                0 ml  Output              200 ml  Net             -200 ml   Filed Weights   05/03/16 0459 05/04/16 0330  Weight: 53.8 kg (118 lb 9.6 oz) 52.3 kg (115 lb 6.4 oz)    Examination: Constitutional: NAD Vitals:   05/03/16 1212 05/03/16 1422 05/03/16 2134 05/04/16 0330  BP: (!) 145/87 140/87 (!) 151/92 (!) 162/89  Pulse: 82 88 99 88  Resp:  18 16 16   Temp:  97.4 F (36.3 C) 98 F (36.7 C) 97.6 F (36.4 C)  TempSrc:  Oral Oral Oral  SpO2: 96%  99% 99%  Weight:    52.3 kg (115 lb 6.4 oz)   Eyes: PERRL, lids and conjunctivae normal Respiratory: clear to auscultation bilaterally, no wheezing, no crackles. Cardiovascular: irregular, good pulses, no edema Abdomen: no tenderness. Bowel sounds positive.  Musculoskeletal: no clubbing / cyanosis.  Skin:Ecchymoses about face and extremities with superficial abrasions and excoriations. Anterior right shin with two wounds, smaller of which has expressed pus.  Neurologic: non focal     Data Reviewed: I have personally reviewed following labs and imaging studies  CBC:  Recent Labs Lab 04/29/16 0446 05/02/16 1643 05/02/16 1644  05/04/16 0003  WBC 11.5* 10.8*  --  10.1  NEUTROABS 8.5* 8.2*  --   --   HGB 12.3 13.4  --  11.0*  HCT 39.1 42.4  --  35.1*  MCV 85.7 86.0  --  85.6  PLT 239 261 198 193   Basic Metabolic Panel:  Recent Labs Lab 04/29/16 0446 05/02/16 1643 05/04/16 0003  NA 139 135 133*  K 3.7 4.4 3.4*  CL 101 100* 98*  CO2 27 26 27   GLUCOSE 128* 107* 102*  BUN 15 16 11   CREATININE 0.98 1.01* 0.98  CALCIUM 9.3 9.2 8.4*   GFR: Estimated Creatinine Clearance: 31.1 mL/min (by C-G formula based on SCr of 0.98 mg/dL). Liver Function Tests:  Recent Labs Lab 05/02/16 1643 05/04/16 0003  AST 32 26  ALT 16 12*  ALKPHOS 136* 103  BILITOT 1.7* 1.3*  PROT 7.3 5.4*  ALBUMIN 3.8 2.9*   No results for input(s): LIPASE, AMYLASE in the last 168 hours. No results for input(s): AMMONIA in the last 168 hours. Coagulation Profile:  Recent Labs Lab 04/29/16 0446 05/02/16 1644  INR 1.69 1.91   Cardiac Enzymes:  Recent Labs Lab 05/03/16 1532 05/03/16 1840 05/04/16 0003  TROPONINI 0.03* 0.03* 0.03*   BNP (last 3 results) No results for input(s): PROBNP in the last 8760 hours. HbA1C: No results for input(s): HGBA1C in the last 72 hours. CBG:  Recent Labs Lab 04/29/16 0437 05/04/16 0550  GLUCAP 105* 87   Lipid Profile: No results for input(s): CHOL, HDL, LDLCALC, TRIG, CHOLHDL, LDLDIRECT in the last 72 hours. Thyroid Function Tests: No results for input(s): TSH, T4TOTAL, FREET4, T3FREE, THYROIDAB in the last 72 hours. Anemia Panel: No results for input(s): VITAMINB12, FOLATE, FERRITIN, TIBC, IRON, RETICCTPCT in the last 72 hours. Urine analysis:    Component Value Date/Time   COLORURINE YELLOW 05/04/2016 0343   APPEARANCEUR HAZY (A) 05/04/2016 0343   LABSPEC 1.006 05/04/2016 0343   PHURINE 7.0 05/04/2016 0343   GLUCOSEU NEGATIVE 05/04/2016 0343   HGBUR NEGATIVE 05/04/2016 0343   BILIRUBINUR NEGATIVE 05/04/2016 0343   KETONESUR NEGATIVE 05/04/2016 0343   PROTEINUR NEGATIVE  05/04/2016 0343   UROBILINOGEN 0.2 02/25/2014 1842   NITRITE NEGATIVE 05/04/2016 0343   LEUKOCYTESUR SMALL (A) 05/04/2016 0343   Sepsis Labs: Invalid input(s): PROCALCITONIN, LACTICIDVEN  Recent Results (from the past 240 hour(s))  Urine culture     Status: Abnormal   Collection Time: 04/27/16 10:17 AM  Result Value Ref Range Status   Specimen Description URINE, CATHETERIZED  Final   Special Requests NONE  Final   Culture >=100,000 COLONIES/mL ENTEROCOCCUS FAECALIS (A)  Final   Report Status 04/29/2016 FINAL  Final   Organism ID, Bacteria ENTEROCOCCUS FAECALIS (A)  Final      Susceptibility   Enterococcus faecalis - MIC*    AMPICILLIN <=2 SENSITIVE Sensitive     LEVOFLOXACIN 1 SENSITIVE Sensitive     NITROFURANTOIN <=16 SENSITIVE Sensitive     VANCOMYCIN 1 SENSITIVE Sensitive     * >=100,000 COLONIES/mL ENTEROCOCCUS FAECALIS  MRSA PCR Screening     Status: Abnormal   Collection Time: 05/02/16 10:58 PM  Result Value Ref Range Status   MRSA by PCR POSITIVE (A) NEGATIVE Final    Comment:        The GeneXpert MRSA Assay (FDA approved for NASAL specimens only), is one component of a comprehensive MRSA colonization surveillance program. It is not intended to diagnose MRSA infection nor to guide or monitor treatment for MRSA infections. RESULT CALLED TO, READ BACK BY AND VERIFIED WITH: A SLOANE,RN @0233  05/03/16 MKELLY,MLT       Radiology Studies: Dg Chest 2 View  Result Date: 05/02/2016 CLINICAL DATA:  Leg swelling EXAM: CHEST  2 VIEW COMPARISON:  04/29/2016 FINDINGS: Cardiomegaly is noted. Status post median sternotomy. Mild interstitial prominence bilaterally suspicious for mild interstitial edema. Trace bilateral pleural effusion with basilar atelectasis. No segmental infiltrate. Thoracic spine osteopenia. IMPRESSION: Cardiomegaly. Mild interstitial prominence bilateral suspicious for mild interstitial edema. No segmental infiltrate. Trace bilateral pleural effusion with  bilateral basilar atelectasis. Electronically Signed   By: Natasha Mead M.D.   On: 05/02/2016 17:20   Dg Tibia/fibula Right  Result Date: 05/02/2016 CLINICAL DATA:  Fall with leg swelling and purulent drainage. EXAM: RIGHT TIBIA AND FIBULA - 2  VIEW COMPARISON:  None. FINDINGS: Nonspecific soft tissue swelling about the leg. There are changes of venous harvesting in the calf. Total knee arthroplasty which is located. Osteopenia. No acute fracture or subluxation. IMPRESSION: Soft tissue swelling without acute osseous finding. Electronically Signed   By: Marnee SpringJonathon  Watts M.D.   On: 05/02/2016 17:22   Ct Head Wo Contrast  Result Date: 05/02/2016 CLINICAL DATA:  Multiple recent falls. EXAM: CT HEAD WITHOUT CONTRAST TECHNIQUE: Contiguous axial images were obtained from the base of the skull through the vertex without intravenous contrast. COMPARISON:  04/29/2016 FINDINGS: Brain: There is no evidence for acute hemorrhage, hydrocephalus, mass lesion, or abnormal extra-axial fluid collection. No definite CT evidence for acute infarction. Diffuse loss of parenchymal volume is consistent with atrophy. Patchy low attenuation in the deep hemispheric and periventricular white matter is nonspecific, but likely reflects chronic microvascular ischemic demyelination. Vascular: Atherosclerotic calcification is visualized in the carotid arteries. No dense MCA sign. Major dural sinuses are unremarkable. Skull: No evidence for fracture. No worrisome lytic or sclerotic lesion. Sinuses/Orbits: The visualized paranasal sinuses and mastoid air cells are clear. Visualized portions of the globes and intraorbital fat are unremarkable. Other: Scalp contusion identified high right parietal region. IMPRESSION: 1. No acute intracranial abnormality. 2. Atrophy with chronic small vessel white matter ischemic disease. 3. Scalp contusion high right parietal region. Electronically Signed   By: Kennith CenterEric  Mansell M.D.   On: 05/02/2016 18:03   Dg  Chest Port 1 View  Result Date: 05/03/2016 CLINICAL DATA:  Chest pain EXAM: PORTABLE CHEST 1 VIEW COMPARISON:  05/02/2016 FINDINGS: Cardiac shadow remains enlarged. Postsurgical changes are again seen. The lungs are well aerated bilaterally. Mild bronchitic changes are seen without focal confluent infiltrate. No sizable effusion is noted. IMPRESSION: Mild interstitial changes without acute abnormality. Electronically Signed   By: Alcide CleverMark  Lukens M.D.   On: 05/03/2016 10:04   Dg Abd 2 Views  Result Date: 05/04/2016 CLINICAL DATA:  Abdominal pain . EXAM: ABDOMEN - 2 VIEW COMPARISON:  CT 04/19/2016. FINDINGS: Prior CABG. Surgical clips noted throughout the abdomen and pelvis. Soft tissues the abdomen are unremarkable. No bowel distention. Stool noted throughout the colon. Atherosclerotic vascular calcification at aortoiliac atherosclerotic vascular calcification. Degenerative changes lumbar spine and both hips. IMPRESSION: 1.  No acute abnormality identified. 2.  Prior CABG. 3.  Aortoiliac atherosclerotic vascular disease. Electronically Signed   By: Maisie Fushomas  Register   On: 05/04/2016 14:23     Scheduled Meds: . apixaban  2.5 mg Oral BID  . aspirin EC  81 mg Oral Daily  . busPIRone  15 mg Oral BID  . calcium-vitamin D  1 tablet Oral BID  . Chlorhexidine Gluconate Cloth  6 each Topical Q0600  . collagenase   Topical Daily  . doxycycline  100 mg Oral Q12H  . famotidine  20 mg Oral QHS  . furosemide  20 mg Oral Daily  . levothyroxine  37.5 mcg Oral QAC breakfast  . metoprolol succinate  25 mg Oral QHS  . mupirocin ointment  1 application Nasal BID  . nitroGLYCERIN  0.2 mg Transdermal Daily  . omega-3 acid ethyl esters  2 g Oral Daily  . PARoxetine  30 mg Oral Daily  . polyvinyl alcohol  1 drop Both Eyes QID  . ranolazine  500 mg Oral BID  . rivastigmine  4.6 mg Transdermal Daily  . senna  1 tablet Oral QHS  . simvastatin  10 mg Oral QHS  . sodium chloride flush  3 mL  Intravenous Q12H  .  sodium chloride flush  3 mL Intravenous Q12H  . traZODone  75 mg Oral QHS   Continuous Infusions:  Pamella Pert, MD, PhD [facility] Pager [phone]  If 7PM-7AM, please contact night-coverage [email or web address] Password [facility] 05/04/2016, 2:29 PM

## 2016-05-04 NOTE — Progress Notes (Signed)
Received notice from UR-reviewer that Medical Advisor advised issuing ABN if pt did not d/c today- spoke with Dr. Elvera LennoxGherghe- awaiting PT eval- does not plan to d/c today- ABN issued at bedside to daughter Marlane HatcherDebra Wall- who elected Opt. 1-

## 2016-05-04 NOTE — Progress Notes (Signed)
Orthopedic Tech Progress Note Patient Details:  [name] SharpsMary Short 30-Nov-1927 [id number]  Ortho Devices Type of Ortho Device: Velcro wrist forearm splint   Saul FordyceJennifer C Meiah Zamudio 05/04/2016, 2:10 PM

## 2016-05-04 NOTE — Progress Notes (Signed)
Patient Name: [name] SharpsMary Martino Date of Encounter: 05/04/2016  Primary Cardiologist: Dr. Crista CurbBerry  Hospital Problem List     Principal Problem:   Ischemic foot Active Problems:   Diabetes mellitus type II, controlled (HCC)   Atrial fibrillation with controlled ventricular response (HCC)   CAD (coronary artery disease) of artery bypass graft   Essential hypertension   Cellulitis   PAD (peripheral artery disease) (HCC)   Acute on chronic diastolic CHF (congestive heart failure) (HCC)   Cellulitis of right lower extremity   Leg edema     Subjective   Pleasantly confused, appears comfortable.   Inpatient Medications    Scheduled Meds: . apixaban  2.5 mg Oral BID  . aspirin EC  81 mg Oral Daily  . busPIRone  15 mg Oral BID  . calcium-vitamin D  1 tablet Oral BID  . Chlorhexidine Gluconate Cloth  6 each Topical Q0600  . collagenase   Topical Daily  . doxycycline  100 mg Oral Q12H  . famotidine  20 mg Oral QHS  . furosemide  20 mg Oral Daily  . levothyroxine  37.5 mcg Oral QAC breakfast  . metoprolol succinate  25 mg Oral QHS  . mupirocin ointment  1 application Nasal BID  . nitroGLYCERIN  0.2 mg Transdermal Daily  . omega-3 acid ethyl esters  2 g Oral Daily  . PARoxetine  30 mg Oral Daily  . polyvinyl alcohol  1 drop Both Eyes QID  . ranolazine  500 mg Oral BID  . rivastigmine  4.6 mg Transdermal Daily  . senna  1 tablet Oral QHS  . simvastatin  10 mg Oral QHS  . sodium chloride flush  3 mL Intravenous Q12H  . sodium chloride flush  3 mL Intravenous Q12H  . traZODone  75 mg Oral QHS   Continuous Infusions:  PRN Meds: sodium chloride, acetaminophen, alum & mag hydroxide-simeth, guaifenesin, HYDROcodone-acetaminophen, ipratropium-albuterol, magnesium hydroxide, ondansetron **OR** ondansetron (ZOFRAN) IV, sodium chloride flush   Vital Signs    Vitals:   05/03/16 1212 05/03/16 1422 05/03/16 2134 05/04/16 0330  BP: (!) 145/87 140/87 (!) 151/92 (!) 162/89  Pulse: 82 88 99  88  Resp:  18 16 16   Temp:  97.4 F (36.3 C) 98 F (36.7 C) 97.6 F (36.4 C)  TempSrc:  Oral Oral Oral  SpO2: 96%  99% 99%  Weight:    115 lb 6.4 oz (52.3 kg)    Intake/Output Summary (Last 24 hours) at 05/04/16 0826 Last data filed at 05/03/16 2308  Gross per 24 hour  Intake                0 ml  Output              200 ml  Net             -200 ml   Filed Weights   05/03/16 0459 05/04/16 0330  Weight: 118 lb 9.6 oz (53.8 kg) 115 lb 6.4 oz (52.3 kg)    Physical Exam   GEN: Well nourished, well developed, in no acute distress.  HEENT: Grossly normal.  Neck: Supple, no JVD, carotid bruits, or masses. Cardiac: RRR, no murmurs, rubs, or gallops. No clubbing, cyanosis, edema.  Radials/DP/PT 2+ and equal bilaterally.  Respiratory:  Respirations regular and unlabored, clear to auscultation bilaterally. GI: Soft, nontender, nondistended, BS + x 4. MS: no deformity or atrophy. Skin: warm and dry, no rash. Diffuse ecchymosis  Neuro:  Strength and sensation are intact.  Psych: Disoriented x 3, pleasant   Labs    CBC  Recent Labs  05/02/16 1643 05/02/16 1644 05/04/16 0003  WBC 10.8*  --  10.1  NEUTROABS 8.2*  --   --   HGB 13.4  --  11.0*  HCT 42.4  --  35.1*  MCV 86.0  --  85.6  PLT 261 198 193   Basic Metabolic Panel  Recent Labs  05/02/16 1643 05/04/16 0003  NA 135 133*  K 4.4 3.4*  CL 100* 98*  CO2 26 27  GLUCOSE 107* 102*  BUN 16 11  CREATININE 1.01* 0.98  CALCIUM 9.2 8.4*   Liver Function Tests  Recent Labs  05/02/16 1643 05/04/16 0003  AST 32 26  ALT 16 12*  ALKPHOS 136* 103  BILITOT 1.7* 1.3*  PROT 7.3 5.4*  ALBUMIN 3.8 2.9*   Cardiac Enzymes  Recent Labs  05/03/16 1532 05/03/16 1840 05/04/16 0003  TROPONINI 0.03* 0.03* 0.03*  D-Dimer  Recent Labs  05/02/16 1644  DDIMER 3.99*     Telemetry     Afib, rate controlled - Personally Reviewed  ECG     Afib - Personally Reviewed  Radiology    Dg Chest 2 View  Result Date:  05/02/2016 CLINICAL DATA:  Leg swelling EXAM: CHEST  2 VIEW COMPARISON:  04/29/2016 FINDINGS: Cardiomegaly is noted. Status post median sternotomy. Mild interstitial prominence bilaterally suspicious for mild interstitial edema. Trace bilateral pleural effusion with basilar atelectasis. No segmental infiltrate. Thoracic spine osteopenia. IMPRESSION: Cardiomegaly. Mild interstitial prominence bilateral suspicious for mild interstitial edema. No segmental infiltrate. Trace bilateral pleural effusion with bilateral basilar atelectasis. Electronically Signed   By: Natasha MeadLiviu  Pop M.D.   On: 05/02/2016 17:20   Dg Tibia/fibula Right  Result Date: 05/02/2016 CLINICAL DATA:  Fall with leg swelling and purulent drainage. EXAM: RIGHT TIBIA AND FIBULA - 2 VIEW COMPARISON:  None. FINDINGS: Nonspecific soft tissue swelling about the leg. There are changes of venous harvesting in the calf. Total knee arthroplasty which is located. Osteopenia. No acute fracture or subluxation. IMPRESSION: Soft tissue swelling without acute osseous finding. Electronically Signed   By: Marnee SpringJonathon  Watts M.D.   On: 05/02/2016 17:22   Ct Head Wo Contrast  Result Date: 05/02/2016 CLINICAL DATA:  Multiple recent falls. EXAM: CT HEAD WITHOUT CONTRAST TECHNIQUE: Contiguous axial images were obtained from the base of the skull through the vertex without intravenous contrast. COMPARISON:  04/29/2016 FINDINGS: Brain: There is no evidence for acute hemorrhage, hydrocephalus, mass lesion, or abnormal extra-axial fluid collection. No definite CT evidence for acute infarction. Diffuse loss of parenchymal volume is consistent with atrophy. Patchy low attenuation in the deep hemispheric and periventricular white matter is nonspecific, but likely reflects chronic microvascular ischemic demyelination. Vascular: Atherosclerotic calcification is visualized in the carotid arteries. No dense MCA sign. Major dural sinuses are unremarkable. Skull: No evidence for  fracture. No worrisome lytic or sclerotic lesion. Sinuses/Orbits: The visualized paranasal sinuses and mastoid air cells are clear. Visualized portions of the globes and intraorbital fat are unremarkable. Other: Scalp contusion identified high right parietal region. IMPRESSION: 1. No acute intracranial abnormality. 2. Atrophy with chronic small vessel white matter ischemic disease. 3. Scalp contusion high right parietal region. Electronically Signed   By: Kennith CenterEric  Mansell M.D.   On: 05/02/2016 18:03   Dg Chest Port 1 View  Result Date: 05/03/2016 CLINICAL DATA:  Chest pain EXAM: PORTABLE CHEST 1 VIEW COMPARISON:  05/02/2016 FINDINGS: Cardiac shadow remains enlarged. Postsurgical changes are again seen.  The lungs are well aerated bilaterally. Mild bronchitic changes are seen without focal confluent infiltrate. No sizable effusion is noted. IMPRESSION: Mild interstitial changes without acute abnormality. Electronically Signed   By: Alcide Clever M.D.   On: 05/03/2016 10:04    Cardiac Studies   Transthoracic Echocardiography 05/03/16 Study Conclusions  - Left ventricle: The cavity size was normal. Systolic function was   mildly to moderately reduced. The estimated ejection fraction was   in the range of 40% to 45%. Diffuse hypokinesis. - Aortic valve: Transvalvular velocity was within the normal range.   There was no stenosis. There was no regurgitation. - Mitral valve: Moderately calcified annulus. Mobility of the   posterior leaflet was restricted. Transvalvular velocity was   within the normal range. There was no evidence for stenosis.   There was moderate regurgitation. Valve area by continuity   equation (using LVOT flow): 1.12 cm^2. - Left atrium: The atrium was severely dilated. - Right atrium: The atrium was severely dilated. - Tricuspid valve: There was moderate regurgitation. - Pulmonary arteries: PA peak pressure: 63 mm Hg (S).   Patient Profile     Ms. Hallett is a 82 year old  female with a past medical history of dementia, peripheral arterial disease, coronary artery disease-status post coronary artery bypass grafting, chronic diastolic congestive heart failure, atrial fibrillation, who was admitted to [facility] on 05/02/2016 for evaluation of large empty edema and foot pain.   Assessment & Plan    1. Chronic diastolic congestive heart failure: Appears euvolemic. Continue Lasix at 20mg  daily.   2. CAD:   She has some chest discomfort.   She is not a good candidate for invasive and interventional procedures.  Would treat her medically - add Imdur 30 mg a day , titrate as needed.   3. Atrial fib:   HR is controlled. Her daughter states that she has fallen 5 times and last week or so. At this point I think that we should stop the Eliquis since she is at risk of hitting her head.  Her daughter understands that this will increase her risk of stroke.   She has dementia and cannot remember that she is not able to walk. She has not walked for the past 2 months and so she's developed some atrophy. Further recommendations at this time.  4. Failure to thrive: The patient clearly has failure to thrive. She has worsening of multiple medical issues.    She is a DO NOT RESUSCITATE.  5. HTN:   Continue medical therapy    Signed, Little Ishikawa, NP  05/04/2016, 8:26 AM   Agree with note by Suzzette Righter NP  No acive problems from our point of view. Will S/O. Call if we can be of further assistance.  Runell Gess, M.D., FACP, [facility], [facility] [address] [place], [place]  [address]  [phone] 05/04/2016 1:42 PM

## 2016-05-05 DIAGNOSIS — I998 Other disorder of circulatory system: Secondary | ICD-10-CM | POA: Diagnosis not present

## 2016-05-05 DIAGNOSIS — I25709 Atherosclerosis of coronary artery bypass graft(s), unspecified, with unspecified angina pectoris: Secondary | ICD-10-CM | POA: Diagnosis not present

## 2016-05-05 DIAGNOSIS — I5033 Acute on chronic diastolic (congestive) heart failure: Secondary | ICD-10-CM | POA: Diagnosis not present

## 2016-05-05 DIAGNOSIS — E1151 Type 2 diabetes mellitus with diabetic peripheral angiopathy without gangrene: Secondary | ICD-10-CM | POA: Diagnosis not present

## 2016-05-05 DIAGNOSIS — I4891 Unspecified atrial fibrillation: Secondary | ICD-10-CM | POA: Diagnosis not present

## 2016-05-05 LAB — CBC
HCT: 32.3 % — ABNORMAL LOW (ref 36.0–46.0)
HEMOGLOBIN: 10.4 g/dL — AB (ref 12.0–15.0)
MCH: 26.9 pg (ref 26.0–34.0)
MCHC: 32.2 g/dL (ref 30.0–36.0)
MCV: 83.7 fL (ref 78.0–100.0)
PLATELETS: 202 10*3/uL (ref 150–400)
RBC: 3.86 MIL/uL — ABNORMAL LOW (ref 3.87–5.11)
RDW: 16.5 % — ABNORMAL HIGH (ref 11.5–15.5)
WBC: 8.6 10*3/uL (ref 4.0–10.5)

## 2016-05-05 LAB — BASIC METABOLIC PANEL
ANION GAP: 7 (ref 5–15)
BUN: 8 mg/dL (ref 6–20)
CALCIUM: 8.3 mg/dL — AB (ref 8.9–10.3)
CO2: 29 mmol/L (ref 22–32)
Chloride: 100 mmol/L — ABNORMAL LOW (ref 101–111)
Creatinine, Ser: 0.88 mg/dL (ref 0.44–1.00)
GFR calc non Af Amer: 58 mL/min — ABNORMAL LOW (ref >60)
GLUCOSE: 97 mg/dL (ref 65–99)
Potassium: 3.2 mmol/L — ABNORMAL LOW (ref 3.5–5.1)
Sodium: 136 mmol/L (ref 135–145)

## 2016-05-05 LAB — GLUCOSE, CAPILLARY: GLUCOSE-CAPILLARY: 100 mg/dL — AB (ref 65–99)

## 2016-05-05 NOTE — Discharge Planning (Signed)
CSW notified that pt is ready for DC. [facility] that [facility] Monday, reports that she has no way to get FL2, DC Summary docs. CSW updated nurse and pt's MD. CSW will remain available for pt as needs are identified.  Efraim Vanallen B. Gean QuintBrown,MSW, LCSWA Clinical Social Work Dept Weekend Social Worker [phone] 5:17 PM

## 2016-05-05 NOTE — Evaluation (Signed)
Physical Therapy Evaluation Patient Details Name: [name] Short MRN: [id number] DOB: 1927-08-12 Today's Date: 05/05/2016   History of Present Illness  82 yo admitted from memory care ALF with bil LE edema. PMHx: possible right radius fx from 04/27/16 xray, vascular dementia, axillofem and fem-fem BPG (currently patent), CAD, CABG, CHF, PAD, DM, Afib  Clinical Impression  Pt pleasantly confused and willing to walk. Pt with splint to RUE and proceeded NWB given no orders. Pt with impaired cognition, balance, gait and transfers who requires assist for all mobility and recommend SNF level of care. Pt will benefit from acute therapy to maximize mobility, strength and balance to decrease fall risk.   sats 98% on RA with mobility    Follow Up Recommendations SNF;Supervision/Assistance - 24 hour    Equipment Recommendations  None recommended by PT    Recommendations for Other Services       Precautions / Restrictions Precautions Precautions: Fall Required Braces or Orthoses: Other Brace/Splint Other Brace/Splint: right wrist splint Restrictions Other Position/Activity Restrictions: no orders written for right wrist      Mobility  Bed Mobility Overal bed mobility: Needs Assistance Bed Mobility: Supine to Sit     Supine to sit: Supervision;HOB elevated     General bed mobility comments: increased time with use of rail   Transfers Overall transfer level: Needs assistance   Transfers: Sit to/from Stand Sit to Stand: Min assist         General transfer comment: mod cues for hand placement and safety from bed and toilet  Ambulation/Gait Ambulation/Gait assistance: Min assist Ambulation Distance (Feet): 90 Feet Assistive device: 1 person hand held assist     Gait velocity interpretation: Below normal speed for age/gender General Gait Details: hand held assist due to right wrist splint, cues for safety with assist for balance and stability throughout  Stairs             Wheelchair Mobility    Modified Rankin (Stroke Patients Only)       Balance Overall balance assessment: Needs assistance   Sitting balance-Leahy Scale: Fair       Standing balance-Leahy Scale: Poor                               Pertinent Vitals/Pain Pain Assessment: No/denies pain    Home Living Family/patient expects to be discharged to:: Skilled nursing facility                      Prior Function Level of Independence: Needs assistance         Comments: pt unable to provide and no family present, per previous admission pt was walking with rollator with assist for bathing and dressing with report of multiple falls     Hand Dominance        Extremity/Trunk Assessment   Upper Extremity Assessment Upper Extremity Assessment: Generalized weakness    Lower Extremity Assessment Lower Extremity Assessment: Generalized weakness    Cervical / Trunk Assessment Cervical / Trunk Assessment: Kyphotic  Communication   Communication: No difficulties  Cognition Arousal/Alertness: Awake/alert Behavior During Therapy: WFL for tasks assessed/performed Overall Cognitive Status: No family/caregiver present to determine baseline cognitive functioning Area of Impairment: Orientation;Memory;Attention;Safety/judgement Orientation Level: Disoriented to;Time;Situation;Place Current Attention Level: Sustained Memory: Decreased short-term memory   Safety/Judgement: Decreased awareness of safety;Decreased awareness of deficits          General Comments  Exercises     Assessment/Plan    PT Assessment Patient needs continued PT services  PT Problem List Decreased strength;Decreased mobility;Decreased safety awareness;Decreased activity tolerance;Decreased cognition;Decreased balance;Decreased knowledge of use of DME          PT Treatment Interventions Gait training;DME instruction;Therapeutic activities;Functional mobility training;Balance  training;Patient/family education    PT Goals (Current goals can be found in the Care Plan section)  Acute Rehab PT Goals Patient Stated Goal: pt unable to state, agreeable to walking PT Goal Formulation: With patient Time For Goal Achievement: 05/19/16 Potential to Achieve Goals: Fair    Frequency Min 3X/week   Barriers to discharge Decreased caregiver support      Co-evaluation               End of Session Equipment Utilized During Treatment: Gait belt Activity Tolerance: Patient tolerated treatment well Patient left: in chair;with call bell/phone within reach;with chair alarm set Nurse Communication: Mobility status    Functional Assessment Tool Used: clinical judgement Functional Limitation: Mobility: Walking and moving around Mobility: Walking and Moving Around Current Status [phone]): At least 20 percent but less than 40 percent impaired, limited or restricted Mobility: Walking and Moving Around Goal Status [phone]): At least 1 percent but less than 20 percent impaired, limited or restricted    Time: 0849-0907 PT Time Calculation (min) (ACUTE ONLY): 18 min   Charges:   PT Evaluation $PT Eval Moderate Complexity: 1 Procedure     PT G Codes:   PT G-Codes **NOT FOR INPATIENT CLASS** Functional Assessment Tool Used: clinical judgement Functional Limitation: Mobility: Walking and moving around Mobility: Walking and Moving Around Current Status ([phone]): At least 20 percent but less than 40 percent impaired, limited or restricted Mobility: Walking and Moving Around Goal Status [phone]([id number]): At least 1 percent but less than 20 percent impaired, limited or restricted    Aleigha Gilani B Deshante Cassell 05/05/2016, 10:05 AM Delaney MeigsMaija Tabor Demon Volante, PT [phone]

## 2016-05-05 NOTE — Progress Notes (Signed)
[facility]                                                                              Patient Demographics  Brettney Ficken, is a 82 y.o. female, DOB - [birth year], ZOX:[id number]  Admit date - 05/02/2016   Admitting Physician Briscoe Deutscher, MD  Outpatient Primary MD for the patient is MAZZOCCHI, Rise Mu, MD  Outpatient specialists:   LOS - 0  days    Chief Complaint  Patient presents with  . Leg Swelling       Brief summary   82 y.o.femalewith medical history significant for vascular dementia, coronary artery disease s/p CABG, PAD status-post bifemoral bypass, chronic diastolic CHF, and atrial fibrillation on Eliquis, now presenting from her SNF for evaluation of bilateral lower extremity edema and foot pain.Patient's bilateral feet were noted to be cold and pulses could not be identified with palpation or with Doppler. Vascular surgery was consulted by the ED physician and advised medical admission to [facility] for further evaluation and management   Assessment & Plan      Bilateral painful feet - Pt has history of PAD with bifemoral bypass grating in 2014  - She presented from SNF for BLE edema, reportedly developing over the past day notes that there had been severe pain in both feet - Vascular surgery was consulted -Patent axillofemoral and femorofemoral bypasses. Good Doppler flow to her feet, no evidence of ischemia, vascular has signed off  Right lower leg wound with cellulitis - Pt has two wounds on anterior right shin, the smaller one with expression of pus and surrounding erythema/edema  - Radiographs feature soft-tissue swelling, but no acute bony abnormality  - There is minimal leukocytosis, but no fever or end-organ damage  - She was treated with IV clindamycin in ED, plan to continue doxycycline BID for now for 7 additional days - Wound care consulted  Abdominal pain on 12/22 -Improved, abdominal x-rays showed no acute  abnormality.   Acute on chronic diastolic CHF  - TTE (10/03/15) with EF 55-60%, mod-sev MR, mod TR, and severe LAE  - Pt presented with increased BLE edema and mild interstitial edema is noted on CXR  - She is saturating well on rm air and not in any respiratory distress  - Continue Lasix, metoprolol, cardiology consulted and signed off 12/22  Atrial fibrillation  - Pt is in rate-controlled a fib on admission  - CHADS-VASc ~8 (age x2, hx CVA x2, CHF, CAD, HTN, gender)  - She is anticoagulated with Eliquis, but has suffered numerous falls in the past few months at the nursing home and presents with bruising and scrapes throughout; may need to re-weigh risk/benefit. Cardiology recommended discontinuing Eliquis, will dc on discharge. Cardiology also discussed in detail with the daughter who understands that this will increase the risk of stroke.  CAD - chest pain resolved.  - Pt is s/p CABG with occluded grafts on subsequent cath  - Continue ASA 81, Toprol, Lovaza as tolerated; hold losartan for now while diuresing, resume as appropriate - Per cardiology, add Imdur 30 mg daily. She is not a good candidate for invasive and  interventional procedures.   Hypertension  - Managed at home with Toprol and losartan - As above, Toprol has been continued and losartan held on admission   Recent enterococcal urinary tract infection - Repeat UA negative  Possible radial neck fracture  - XR 12/15 Irregularity of the radial neck could be due to degenerative spurring versus nondisplaced fracture -follow with ortho outpatient, Dr Elvera Lennox discussed with Dr. Madelon Lips with whom patient was supposed to follow up on 12/21, no acute needs  Dispo: patient currently lives in memory unit without SNF level care, she has had several falls recently, suspect that she will need higher level of care on d/c. PT evaluation done today on 12/23 recommended skilled nursing facility. Will await social work consultation  regarding SNF facilities  Code Status: DO NOT RESUSCITATE DVT Prophylaxis: eliquis Family Communication: Discussed in detail with the patient, all imaging results, lab results explained to the patient   Disposition Plan: Once facility available  Time Spent in minutes    Procedures:    Consultants:   Cardiology  Antimicrobials:    None  Medications  Scheduled Meds: . apixaban  2.5 mg Oral BID  . aspirin EC  81 mg Oral Daily  . busPIRone  15 mg Oral BID  . calcium-vitamin D  1 tablet Oral BID  . Chlorhexidine Gluconate Cloth  6 each Topical Q0600  . collagenase   Topical Daily  . doxycycline  100 mg Oral Q12H  . famotidine  20 mg Oral QHS  . furosemide  20 mg Oral Daily  . levothyroxine  37.5 mcg Oral QAC breakfast  . metoprolol succinate  25 mg Oral QHS  . mupirocin ointment  1 application Nasal BID  . nitroGLYCERIN  0.2 mg Transdermal Daily  . omega-3 acid ethyl esters  2 g Oral Daily  . PARoxetine  30 mg Oral Daily  . polyvinyl alcohol  1 drop Both Eyes QID  . ranolazine  500 mg Oral BID  . rivastigmine  4.6 mg Transdermal Daily  . senna  1 tablet Oral QHS  . simvastatin  10 mg Oral QHS  . sodium chloride flush  3 mL Intravenous Q12H  . sodium chloride flush  3 mL Intravenous Q12H  . traZODone  75 mg Oral QHS   Continuous Infusions: PRN Meds:.sodium chloride, acetaminophen, alum & mag hydroxide-simeth, guaifenesin, HYDROcodone-acetaminophen, ipratropium-albuterol, magnesium hydroxide, ondansetron **OR** ondansetron (ZOFRAN) IV, sodium chloride flush   Antibiotics   Anti-infectives    Start     Dose/Rate Route Frequency Ordered Stop   05/04/16 0000  doxycycline (VIBRA-TABS) 100 MG tablet     100 mg Oral Every 12 hours 05/04/16 1329     05/02/16 2200  doxycycline (VIBRA-TABS) tablet 100 mg     100 mg Oral Every 12 hours 05/02/16 1958     05/02/16 1700  clindamycin (CLEOCIN) IVPB 900 mg     900 mg 100 mL/hr over 30 Minutes Intravenous  Once  05/02/16 1645 05/02/16 1803        Subjective:   Lillian Ballester was seen and examined today.Denies any specific complaints today. Patient denies dizziness, chest pain, shortness of breath, abdominal pain, N/V/D/C, new weakness, numbess, tingling. No acute events overnight.    Objective:   Vitals:   05/04/16 1939 05/05/16 0525 05/05/16 0950 05/05/16 0954  BP:  (!) 142/83 99/67   Pulse:  83 70 88  Resp:      Temp:      TempSrc:  Axillary  SpO2: 93% 100% 95% 98%  Weight:  51 kg (112 lb 6.4 oz)      Intake/Output Summary (Last 24 hours) at 05/05/16 1158 Last data filed at 05/05/16 0830  Gross per 24 hour  Intake              720 ml  Output                0 ml  Net              720 ml     Wt Readings from Last 3 Encounters:  05/05/16 51 kg (112 lb 6.4 oz)  04/16/16 45.8 kg (101 lb)  04/04/16 54.4 kg (120 lb)     Exam  General: Alert and oriented x 3, NAD  HEENT:    Neck:   Cardiovascular: S1 S2 auscultated, no rubs, murmurs or gallops. Regular rate and rhythm.  Respiratory: Clear to auscultation bilaterally, no wheezing, rales or rhonchi  Gastrointestinal: Soft, nontender, nondistended, + bowel sounds  Ext: no cyanosis clubbing or edema  Neuro: AAOx3, Cr N's II- XII. Strength 5/5 upper and lower extremities bilaterally  Skin: No rashes  Psych: Normal affect and demeanor, alert and oriented x3    Data Reviewed:  I have personally reviewed following labs and imaging studies  Micro Results Recent Results (from the past 240 hour(s))  Urine culture     Status: Abnormal   Collection Time: 04/27/16 10:17 AM  Result Value Ref Range Status   Specimen Description URINE, CATHETERIZED  Final   Special Requests NONE  Final   Culture >=100,000 COLONIES/mL ENTEROCOCCUS FAECALIS (A)  Final   Report Status 04/29/2016 FINAL  Final   Organism ID, Bacteria ENTEROCOCCUS FAECALIS (A)  Final      Susceptibility   Enterococcus faecalis - MIC*    AMPICILLIN <=2 SENSITIVE  Sensitive     LEVOFLOXACIN 1 SENSITIVE Sensitive     NITROFURANTOIN <=16 SENSITIVE Sensitive     VANCOMYCIN 1 SENSITIVE Sensitive     * >=100,000 COLONIES/mL ENTEROCOCCUS FAECALIS  MRSA PCR Screening     Status: Abnormal   Collection Time: 05/02/16 10:58 PM  Result Value Ref Range Status   MRSA by PCR POSITIVE (A) NEGATIVE Final    Comment:        The GeneXpert MRSA Assay (FDA approved for NASAL specimens only), is one component of a comprehensive MRSA colonization surveillance program. It is not intended to diagnose MRSA infection nor to guide or monitor treatment for MRSA infections. RESULT CALLED TO, READ BACK BY AND VERIFIED WITH: A SLOANE,RN @0233  05/03/16 MKELLY,MLT     Radiology Reports Dg Chest 2 View  Result Date: 05/02/2016 CLINICAL DATA:  Leg swelling EXAM: CHEST  2 VIEW COMPARISON:  04/29/2016 FINDINGS: Cardiomegaly is noted. Status post median sternotomy. Mild interstitial prominence bilaterally suspicious for mild interstitial edema. Trace bilateral pleural effusion with basilar atelectasis. No segmental infiltrate. Thoracic spine osteopenia. IMPRESSION: Cardiomegaly. Mild interstitial prominence bilateral suspicious for mild interstitial edema. No segmental infiltrate. Trace bilateral pleural effusion with bilateral basilar atelectasis. Electronically Signed   By: Natasha Mead M.D.   On: 05/02/2016 17:20   Dg Chest 2 View  Result Date: 04/29/2016 CLINICAL DATA:  Larey Seat.  Right hip pain. EXAM: CHEST  2 VIEW COMPARISON:  04/27/2016 FINDINGS: Previous median sternotomy and CABG. Chronic cardiomegaly. Lungs are clear. No edema, infiltrate, collapse or effusion. No thoracic bone trauma. IMPRESSION: Chronic cardiomegaly.  No active disease. Electronically Signed   By: Loraine Leriche  Shogry M.D.   On: 04/29/2016 06:26   Dg Chest 2 View  Result Date: 04/27/2016 CLINICAL DATA:  Unwitnessed fall. EXAM: CHEST  2 VIEW COMPARISON:  Radiographs of April 04, 2016. FINDINGS: Stable  cardiomegaly. Status post coronary artery bypass graft. No pneumothorax is noted. Probable mild left pleural effusion is noted with adjacent subsegmental atelectasis. Atherosclerosis of thoracic aorta is noted. Bony thorax is unremarkable. IMPRESSION: Probable mild left basilar subsegmental atelectasis with associated pleural effusion. Electronically Signed   By: Lupita Raider, M.D.   On: 04/27/2016 10:08   Dg Pelvis 1-2 Views  Result Date: 04/29/2016 CLINICAL DATA:  Larey Seat.  Right hip pain. EXAM: PELVIS - 1-2 VIEW COMPARISON:  04/27/2016 FINDINGS: There is no evidence of pelvic fracture or diastasis. No pelvic bone lesions are seen. IMPRESSION: Negative. Electronically Signed   By: Paulina Fusi M.D.   On: 04/29/2016 06:28   Dg Pelvis 1-2 Views  Result Date: 04/27/2016 CLINICAL DATA:  Fall. EXAM: PELVIS - 1-2 VIEW COMPARISON:  CT pelvis 04/03/2016 FINDINGS: There is no evidence of pelvic fracture or diastasis. No pelvic bone lesions are seen. Surgical clips in the groin bilaterally from prior vascular bypass grafting. IMPRESSION: Negative. Electronically Signed   By: Marlan Palau M.D.   On: 04/27/2016 10:08   Dg Forearm Right  Result Date: 04/27/2016 CLINICAL DATA:  Fall. EXAM: RIGHT FOREARM - 2 VIEW COMPARISON:  None. FINDINGS: Irregularity of the radial neck could be due to degenerative spurring versus nondisplaced fracture. Correlate with pain and follow-up x-rays. Degenerative change in the elbow joint. Soft tissue calcification ventral to the distal humerus. Degenerative change in the wrist joint with chondrocalcinosis. IMPRESSION: Cannot exclude radial neck fracture. Correlate with pain in follow-up x-rays if appropriate. Electronically Signed   By: Marlan Palau M.D.   On: 04/27/2016 10:07   Dg Tibia/fibula Right  Result Date: 05/02/2016 CLINICAL DATA:  Fall with leg swelling and purulent drainage. EXAM: RIGHT TIBIA AND FIBULA - 2 VIEW COMPARISON:  None. FINDINGS: Nonspecific soft  tissue swelling about the leg. There are changes of venous harvesting in the calf. Total knee arthroplasty which is located. Osteopenia. No acute fracture or subluxation. IMPRESSION: Soft tissue swelling without acute osseous finding. Electronically Signed   By: Marnee Spring M.D.   On: 05/02/2016 17:22   Ct Head Wo Contrast  Result Date: 05/02/2016 CLINICAL DATA:  Multiple recent falls. EXAM: CT HEAD WITHOUT CONTRAST TECHNIQUE: Contiguous axial images were obtained from the base of the skull through the vertex without intravenous contrast. COMPARISON:  04/29/2016 FINDINGS: Brain: There is no evidence for acute hemorrhage, hydrocephalus, mass lesion, or abnormal extra-axial fluid collection. No definite CT evidence for acute infarction. Diffuse loss of parenchymal volume is consistent with atrophy. Patchy low attenuation in the deep hemispheric and periventricular white matter is nonspecific, but likely reflects chronic microvascular ischemic demyelination. Vascular: Atherosclerotic calcification is visualized in the carotid arteries. No dense MCA sign. Major dural sinuses are unremarkable. Skull: No evidence for fracture. No worrisome lytic or sclerotic lesion. Sinuses/Orbits: The visualized paranasal sinuses and mastoid air cells are clear. Visualized portions of the globes and intraorbital fat are unremarkable. Other: Scalp contusion identified high right parietal region. IMPRESSION: 1. No acute intracranial abnormality. 2. Atrophy with chronic small vessel white matter ischemic disease. 3. Scalp contusion high right parietal region. Electronically Signed   By: Kennith Center M.D.   On: 05/02/2016 18:03   Ct Head Wo Contrast  Result Date: 04/29/2016 CLINICAL DATA:  Found down  her bed, history of multiple falls EXAM: CT HEAD WITHOUT CONTRAST CT CERVICAL SPINE WITHOUT CONTRAST TECHNIQUE: Multidetector CT imaging of the head and cervical spine was performed following the standard protocol without  intravenous contrast. Multiplanar CT image reconstructions of the cervical spine were also generated. COMPARISON:  04/27/2016 FINDINGS: CT HEAD FINDINGS Brain: No intracranial hemorrhage, mass effect or midline shift. There are motion artifacts. Stable atrophy and chronic white matter disease. Ventricular size is stable from prior exam. No acute cortical infarction. No mass lesion is noted on this unenhanced scan. Vascular: Again noted atherosclerotic calcifications of carotid siphon. Skull: No skull fracture is noted. The examination is limited by motion artifacts. Sinuses/Orbits: No acute findings. Other: Again noted scalp hematoma in right parietal region. CT CERVICAL SPINE FINDINGS Alignment: The alignment is preserved. Skull base and vertebrae: No acute fracture or subluxation. There are degenerative changes C1-C2 articulation. Soft tissues and spinal canal: No prevertebral soft tissue swelling. No spinal canal hematoma. Disc levels: There is disc space flattening with mild anterior and mild posterior spurring at C4-C5 level. Disc space flattening with mild anterior and mild posterior spurring at C6-C7 level. Upper chest: There is no pneumothorax in visualized lung apices. Other: Atherosclerotic calcifications are noted bilateral carotid bifurcation. There is thickening of upper esophageal wall. Gastroesophageal reflux cannot be excluded. Extensive atherosclerotic calcifications of thoracic aorta. IMPRESSION: 1. No acute intracranial abnormality. Stable atrophy and chronic white matter disease. No definite acute cortical infarction. Again noted scalp hematoma in right parietal region. Please note there are motion artifacts. 2. No cervical spine acute fracture or subluxation. Mild degenerative changes as described above. No significant change. Radiation exposure alert: this patient has had an unusual number of CT scans. To prevent further radiation exposure in the future, non radiating studies are recommended  such as ultrasound or MRI. Starting 01/27/16 the patient underwent 12 CT scan examinations. Electronically Signed   By: Natasha MeadLiviu  Pop M.D.   On: 04/29/2016 09:22   Ct Head Wo Contrast  Result Date: 04/27/2016 CLINICAL DATA:  The patient fell out of bed last night. She was found down. Initial encounter. EXAM: CT HEAD WITHOUT CONTRAST CT CERVICAL SPINE WITHOUT CONTRAST TECHNIQUE: Multidetector CT imaging of the head and cervical spine was performed following the standard protocol without intravenous contrast. Multiplanar CT image reconstructions of the cervical spine were also generated. COMPARISON:  Head and cervical spine CT scans 03/20/2016. Head CT scan 04/25/2016. FINDINGS: CT HEAD FINDINGS Brain: Cortical atrophy and chronic microvascular ischemic change are identified. No evidence of acute intracranial abnormality including hemorrhage, infarct, mass lesion, mass effect, midline shift or abnormal extra-axial fluid collection is identified. No hydrocephalus or pneumocephalus. Vascular: Atherosclerotic vascular disease is seen. Skull: Intact.  No focal lesion. Sinuses/Orbits: Negative. Other: Scalp hematoma over the right parietal bone is identified. CT CERVICAL SPINE FINDINGS Alignment: Maintained. Skull base and vertebrae: No acute fracture or focal lesion. Soft tissues and spinal canal: No prevertebral fluid or swelling. No visible canal hematoma. Disc levels: Scattered facet degenerative change is again seen. Loss of disc space height and endplate spurring appear worst at C4-5 and C6-7. Upper chest: Atherosclerosis noted. Other: None. IMPRESSION: Scalp hematoma over the right parietal bone. No acute intracranial abnormality. No acute abnormality cervical spine. Atrophy and chronic microvascular ischemic change. Atherosclerosis. Electronically Signed   By: Drusilla Kannerhomas  Dalessio M.D.   On: 04/27/2016 10:49   Ct Head Wo Contrast  Result Date: 04/25/2016 CLINICAL DATA:  82 y/o F; fell from wheelchair with injury  to  back of head and pain. EXAM: CT HEAD WITHOUT CONTRAST TECHNIQUE: Contiguous axial images were obtained from the base of the skull through the vertex without intravenous contrast. COMPARISON:  04/16/2016 CT of the head. FINDINGS: Brain: No evidence of acute infarction, hemorrhage, hydrocephalus, extra-axial collection or mass lesion/mass effect. Patchy foci of hypoattenuation within subcortical and periventricular white matter are compatible with moderate chronic microvascular ischemic changes. Lucency in the right became in is unchanged consistent with chronic lacunar infarct. Mild brain parenchymal volume loss. Vascular: Extensive calcific atherosclerosis of the cavernous internal carotid arteries. Skull: Right parietal scalp small contusion. No displaced calvarial fracture. Sinuses/Orbits: No acute finding. Underpneumatized frontal sinuses. Bilateral intra-ocular lens replacement. Other: None. IMPRESSION: 1. Right parietal small scalp contusion. No displaced calvarial fracture. 2. No acute intracranial abnormality is identified. 3. Stable brain parenchymal volume loss and chronic microvascular ischemic changes. Electronically Signed   By: Mitzi Hansen M.D.   On: 04/25/2016 23:03   Ct Head Wo Contrast  Result Date: 04/16/2016 CLINICAL DATA:  82 year old female with history of fall with injury to the head today. EXAM: CT HEAD WITHOUT CONTRAST TECHNIQUE: Contiguous axial images were obtained from the base of the skull through the vertex without intravenous contrast. COMPARISON:  Head CT 04/04/2016. FINDINGS: Brain: Mild cerebral atrophy. Patchy and confluent areas of decreased attenuation are noted throughout the deep and periventricular white matter of the cerebral hemispheres bilaterally, compatible with chronic microvascular ischemic disease. Physiologic calcifications in the basal ganglia bilaterally. No evidence of acute infarction, hemorrhage, hydrocephalus, extra-axial collection or mass  lesion/mass effect. Vascular: No hyperdense vessel or unexpected calcification. Skull: Normal. Negative for fracture or focal lesion. Sinuses/Orbits: No acute finding. Other: None. IMPRESSION: 1. No evidence of significant acute traumatic injury to the skull or brain. 2. Mild cerebral atrophy. 3. Chronic microvascular ischemic changes in the cerebral white matter, as above. Electronically Signed   By: Trudie Reed M.D.   On: 04/16/2016 13:03   Ct Chest W Contrast  Result Date: 04/29/2016 CLINICAL DATA:  Fall.  Patient on blood thinners. EXAM: CT CHEST, ABDOMEN, AND PELVIS WITH CONTRAST TECHNIQUE: Multidetector CT imaging of the chest, abdomen and pelvis was performed following the standard protocol during bolus administration of intravenous contrast. CONTRAST:  75mL ISOVUE-300 IOPAMIDOL (ISOVUE-300) INJECTION 61% COMPARISON:  None. FINDINGS: CT CHEST FINDINGS Cardiovascular: Cardiomegaly. Diffuse coronary artery and aortic calcifications. No evidence of aortic aneurysm. Right axillofemoral bypass graft noted which is patent. Mediastinum/Nodes: No mediastinal, hilar, or axillary adenopathy. Lungs/Pleura: Moderate bilateral pleural effusions. No confluent airspace opacities or pneumothorax. Musculoskeletal: Fracture noted through the posterior left twelfth rib. This appears chronic as the bone margins are well corticated. Probable old fracture through the lateral seventh rib. No definite acute bony abnormality. CT ABDOMEN PELVIS FINDINGS Hepatobiliary: No focal hepatic abnormality. Gallbladder unremarkable. Pancreas: No focal abnormality or ductal dilatation. Spleen: No focal abnormality.  Normal size. Adrenals/Urinary Tract: No adrenal abnormality. No focal renal abnormality. No stones or hydronephrosis. Urinary bladder is unremarkable. Stomach/Bowel: Descending colonic and sigmoid diverticulosis. No active diverticulitis. Stomach, large and small bowel decompressed, otherwise grossly unremarkable.  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 There appears to be and included aorto femoral bypass graft. Fem-fem crossover bypass graft is noted and is patent. Right axillofemoral bypass graft also noted, patent. No evidence of aortic aneurysm. No adenopathy. Reproductive: Prior hysterectomy.  No adnexal masses. Other: No free fluid or free air. Musculoskeletal: No acute bony abnormality or focal bone lesion. IMPRESSION: No evidence of significant traumatic injury  in the chest, abdomen or pelvis. Old appearing right lateral seventh rib fracture and left posterior twelfth rib fracture. No definite acute fracture. Moderate bilateral pleural effusions. Cardiomegaly, coronary artery disease. Aortic atherosclerosis. 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 Patent right axillofemoral bypass and fem-fem crossover bypass graft. Left colonic diverticulosis. Electronically Signed   By: Charlett NoseKevin  Dover M.D.   On: 04/29/2016 09:14   Ct Cervical Spine Wo Contrast  Result Date: 04/29/2016 CLINICAL DATA:  Found down her bed, history of multiple falls EXAM: CT HEAD WITHOUT CONTRAST CT CERVICAL SPINE WITHOUT CONTRAST TECHNIQUE: Multidetector CT imaging of the head and cervical spine was performed following the standard protocol without intravenous contrast. Multiplanar CT image reconstructions of the cervical spine were also generated. COMPARISON:  04/27/2016 FINDINGS: CT HEAD FINDINGS Brain: No intracranial hemorrhage, mass effect or midline shift. There are motion artifacts. Stable atrophy and chronic white matter disease. Ventricular size is stable from prior exam. No acute cortical infarction. No mass lesion is noted on this unenhanced scan. Vascular: Again noted atherosclerotic calcifications of carotid siphon. Skull: No skull fracture is noted. The examination is limited by motion artifacts. Sinuses/Orbits: No acute findings. Other: Again noted scalp hematoma in right  parietal region. CT CERVICAL SPINE FINDINGS Alignment: The alignment is preserved. Skull base and vertebrae: No acute fracture or subluxation. There are degenerative changes C1-C2 articulation. Soft tissues and spinal canal: No prevertebral soft tissue swelling. No spinal canal hematoma. Disc levels: There is disc space flattening with mild anterior and mild posterior spurring at C4-C5 level. Disc space flattening with mild anterior and mild posterior spurring at C6-C7 level. Upper chest: There is no pneumothorax in visualized lung apices. Other: Atherosclerotic calcifications are noted bilateral carotid bifurcation. There is thickening of upper esophageal wall. Gastroesophageal reflux cannot be excluded. Extensive atherosclerotic calcifications of thoracic aorta. IMPRESSION: 1. No acute intracranial abnormality. Stable atrophy and chronic white matter disease. No definite acute cortical infarction. Again noted scalp hematoma in right parietal region. Please note there are motion artifacts. 2. No cervical spine acute fracture or subluxation. Mild degenerative changes as described above. No significant change. Radiation exposure alert: this patient has had an unusual number of CT scans. To prevent further radiation exposure in the future, non radiating studies are recommended such as ultrasound or MRI. Starting 01/27/16 the patient underwent 12 CT scan examinations. Electronically Signed   By: Natasha MeadLiviu  Pop M.D.   On: 04/29/2016 09:22   Ct Cervical Spine Wo Contrast  Result Date: 04/27/2016 CLINICAL DATA:  The patient fell out of bed last night. She was found down. Initial encounter. EXAM: CT HEAD WITHOUT CONTRAST CT CERVICAL SPINE WITHOUT CONTRAST TECHNIQUE: Multidetector CT imaging of the head and cervical spine was performed following the standard protocol without intravenous contrast. Multiplanar CT image reconstructions of the cervical spine were also generated. COMPARISON:  Head and cervical spine CT scans  03/20/2016. Head CT scan 04/25/2016. FINDINGS: CT HEAD FINDINGS Brain: Cortical atrophy and chronic microvascular ischemic change are identified. No evidence of acute intracranial abnormality including hemorrhage, infarct, mass lesion, mass effect, midline shift or abnormal extra-axial fluid collection is identified. No hydrocephalus or pneumocephalus. Vascular: Atherosclerotic vascular disease is seen. Skull: Intact.  No focal lesion. Sinuses/Orbits: Negative. Other: Scalp hematoma over the right parietal bone is identified. CT CERVICAL SPINE FINDINGS Alignment: Maintained. Skull base and vertebrae: No acute fracture or focal lesion. Soft tissues and spinal canal: No prevertebral fluid or swelling. No visible canal hematoma. Disc levels: Scattered facet  degenerative change is again seen. Loss of disc space height and endplate spurring appear worst at C4-5 and C6-7. Upper chest: Atherosclerosis noted. Other: None. IMPRESSION: Scalp hematoma over the right parietal bone. No acute intracranial abnormality. No acute abnormality cervical spine. Atrophy and chronic microvascular ischemic change. Atherosclerosis. Electronically Signed   By: Drusilla Kanner M.D.   On: 04/27/2016 10:49   Ct Abdomen Pelvis W Contrast  Result Date: 04/29/2016 CLINICAL DATA:  Fall.  Patient on blood thinners. EXAM: CT CHEST, ABDOMEN, AND PELVIS WITH CONTRAST TECHNIQUE: Multidetector CT imaging of the chest, abdomen and pelvis was performed following the standard protocol during bolus administration of intravenous contrast. CONTRAST:  75mL ISOVUE-300 IOPAMIDOL (ISOVUE-300) INJECTION 61% COMPARISON:  None. FINDINGS: CT CHEST FINDINGS Cardiovascular: Cardiomegaly. Diffuse coronary artery and aortic calcifications. No evidence of aortic aneurysm. Right axillofemoral bypass graft noted which is patent. Mediastinum/Nodes: No mediastinal, hilar, or axillary adenopathy. Lungs/Pleura: Moderate bilateral pleural effusions. No confluent airspace  opacities or pneumothorax. Musculoskeletal: Fracture noted through the posterior left twelfth rib. This appears chronic as the bone margins are well corticated. Probable old fracture through the lateral seventh rib. No definite acute bony abnormality. CT ABDOMEN PELVIS FINDINGS Hepatobiliary: No focal hepatic abnormality. Gallbladder unremarkable. Pancreas: No focal abnormality or ductal dilatation. Spleen: No focal abnormality.  Normal size. Adrenals/Urinary Tract: No adrenal abnormality. No focal renal abnormality. No stones or hydronephrosis. Urinary bladder is unremarkable. Stomach/Bowel: Descending colonic and sigmoid diverticulosis. No active diverticulitis. Stomach, large and small bowel decompressed, otherwise grossly unremarkable. 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 There appears to be and included aorto femoral bypass graft. Fem-fem crossover bypass graft is noted and is patent. Right axillofemoral bypass graft also noted, patent. No evidence of aortic aneurysm. No adenopathy. Reproductive: Prior hysterectomy.  No adnexal masses. Other: No free fluid or free air. Musculoskeletal: No acute bony abnormality or focal bone lesion. IMPRESSION: No evidence of significant traumatic injury in the chest, abdomen or pelvis. Old appearing right lateral seventh rib fracture and left posterior twelfth rib fracture. No definite acute fracture. Moderate bilateral pleural effusions. Cardiomegaly, coronary artery disease. Aortic atherosclerosis. 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 Patent right axillofemoral bypass and fem-fem crossover bypass graft. Left colonic diverticulosis. Electronically Signed   By: Charlett Nose M.D.   On: 04/29/2016 09:14   Dg Chest Port 1 View  Result Date: 05/03/2016 CLINICAL DATA:  Chest pain EXAM: PORTABLE CHEST 1 VIEW COMPARISON:  05/02/2016 FINDINGS: Cardiac shadow remains enlarged. Postsurgical changes are again seen. The  lungs are well aerated bilaterally. Mild bronchitic changes are seen without focal confluent infiltrate. No sizable effusion is noted. IMPRESSION: Mild interstitial changes without acute abnormality. Electronically Signed   By: Alcide Clever M.D.   On: 05/03/2016 10:04   Dg Abd 2 Views  Result Date: 05/04/2016 CLINICAL DATA:  Abdominal pain . EXAM: ABDOMEN - 2 VIEW COMPARISON:  CT 04/19/2016. FINDINGS: Prior CABG. Surgical clips noted throughout the abdomen and pelvis. Soft tissues the abdomen are unremarkable. No bowel distention. Stool noted throughout the colon. Atherosclerotic vascular calcification at aortoiliac atherosclerotic vascular calcification. Degenerative changes lumbar spine and both hips. IMPRESSION: 1.  No acute abnormality identified. 2.  Prior CABG. 3.  Aortoiliac atherosclerotic vascular disease. Electronically Signed   By: Maisie Fus  Register   On: 05/04/2016 14:23   Dg Hip Unilat W Or Wo Pelvis 2-3 Views Right  Result Date: 04/25/2016 CLINICAL DATA:  Fall with pain EXAM: DG HIP (WITH OR WITHOUT PELVIS)  2-3V RIGHT COMPARISON:  None. FINDINGS: Surgical clips over the bilateral groins. No acute fracture or dislocation. Pubic symphysis appears intact. SI joints are symmetric. Vascular calcifications in the pelvis and right groin. IMPRESSION: No radiographic evidence for acute osseous abnormality Electronically Signed   By: Jasmine Pang M.D.   On: 04/25/2016 22:32   Dg Femur, Min 2 Views Right  Result Date: 04/29/2016 CLINICAL DATA:  Larey Seat.  Right hip pain. EXAM: RIGHT FEMUR 2 VIEWS COMPARISON:  Same day FINDINGS: Previous total knee arthroplasty. No trauma seen in that region. No more proximal femoral fracture. Right hemipelvis as seen appears negative. IMPRESSION: Negative Electronically Signed   By: Paulina Fusi M.D.   On: 04/29/2016 06:27    Lab Data:  CBC:  Recent Labs Lab 04/29/16 0446 05/02/16 1643 05/02/16 1644 05/04/16 0003 05/05/16 0413  WBC 11.5* 10.8*  --  10.1  8.6  NEUTROABS 8.5* 8.2*  --   --   --   HGB 12.3 13.4  --  11.0* 10.4*  HCT 39.1 42.4  --  35.1* 32.3*  MCV 85.7 86.0  --  85.6 83.7  PLT 239 261 198 193 202   Basic Metabolic Panel:  Recent Labs Lab 04/29/16 0446 05/02/16 1643 05/04/16 0003 05/05/16 0413  NA 139 135 133* 136  K 3.7 4.4 3.4* 3.2*  CL 101 100* 98* 100*  CO2 27 26 27 29   GLUCOSE 128* 107* 102* 97  BUN 15 16 11 8   CREATININE 0.98 1.01* 0.98 0.88  CALCIUM 9.3 9.2 8.4* 8.3*   GFR: Estimated Creatinine Clearance: 34.6 mL/min (by C-G formula based on SCr of 0.88 mg/dL). Liver Function Tests:  Recent Labs Lab 05/02/16 1643 05/04/16 0003  AST 32 26  ALT 16 12*  ALKPHOS 136* 103  BILITOT 1.7* 1.3*  PROT 7.3 5.4*  ALBUMIN 3.8 2.9*   No results for input(s): LIPASE, AMYLASE in the last 168 hours. No results for input(s): AMMONIA in the last 168 hours. Coagulation Profile:  Recent Labs Lab 04/29/16 0446 05/02/16 1644  INR 1.69 1.91   Cardiac Enzymes:  Recent Labs Lab 05/03/16 1532 05/03/16 1840 05/04/16 0003  TROPONINI 0.03* 0.03* 0.03*   BNP (last 3 results) No results for input(s): PROBNP in the last 8760 hours. HbA1C: No results for input(s): HGBA1C in the last 72 hours. CBG:  Recent Labs Lab 04/29/16 0437 05/04/16 0550 05/05/16 0650  GLUCAP 105* 87 100*   Lipid Profile: No results for input(s): CHOL, HDL, LDLCALC, TRIG, CHOLHDL, LDLDIRECT in the last 72 hours. Thyroid Function Tests: No results for input(s): TSH, T4TOTAL, FREET4, T3FREE, THYROIDAB in the last 72 hours. Anemia Panel: No results for input(s): VITAMINB12, FOLATE, FERRITIN, TIBC, IRON, RETICCTPCT in the last 72 hours. Urine analysis:    Component Value Date/Time   COLORURINE YELLOW 05/04/2016 0343   APPEARANCEUR HAZY (A) 05/04/2016 0343   LABSPEC 1.006 05/04/2016 0343   PHURINE 7.0 05/04/2016 0343   GLUCOSEU NEGATIVE 05/04/2016 0343   HGBUR NEGATIVE 05/04/2016 0343   BILIRUBINUR NEGATIVE 05/04/2016 0343    KETONESUR NEGATIVE 05/04/2016 0343   PROTEINUR NEGATIVE 05/04/2016 0343   UROBILINOGEN 0.2 02/25/2014 1842   NITRITE NEGATIVE 05/04/2016 0343   LEUKOCYTESUR SMALL (A) 05/04/2016 0343     Zhoey Blackstock M.D. [facility] 05/05/2016, 11:58 AM  Pager: [phone] Between 7am to 7pm - call Pager - [phone]  After 7pm go to [email or web address] - password [email or web address]  Call night coverage person covering after 7pm

## 2016-05-06 DIAGNOSIS — E1151 Type 2 diabetes mellitus with diabetic peripheral angiopathy without gangrene: Secondary | ICD-10-CM | POA: Diagnosis not present

## 2016-05-06 DIAGNOSIS — I25709 Atherosclerosis of coronary artery bypass graft(s), unspecified, with unspecified angina pectoris: Secondary | ICD-10-CM | POA: Diagnosis not present

## 2016-05-06 DIAGNOSIS — I4891 Unspecified atrial fibrillation: Secondary | ICD-10-CM | POA: Diagnosis not present

## 2016-05-06 DIAGNOSIS — I5033 Acute on chronic diastolic (congestive) heart failure: Secondary | ICD-10-CM | POA: Diagnosis not present

## 2016-05-06 DIAGNOSIS — I998 Other disorder of circulatory system: Secondary | ICD-10-CM | POA: Diagnosis not present

## 2016-05-06 LAB — GLUCOSE, CAPILLARY: Glucose-Capillary: 122 mg/dL — ABNORMAL HIGH (ref 65–99)

## 2016-05-06 NOTE — Discharge Summary (Signed)
Physician Discharge Summary   Patient ID: [name] SharpsMary Short MRN: [id number] DOB/AGE: [birth year] 82 y.o.  Admit date: 05/02/2016 Discharge date: 05/06/2016  Primary Care Physician:  MAZZOCCHI, Rise MuANNMARIE, MD  Discharge Diagnoses:    . Cellulitis . Atrial fibrillation with controlled ventricular response (HCC) . CAD (coronary artery disease) of artery bypass graft . Essential hypertension . PAD (peripheral artery disease) (HCC) . Acute on chronic diastolic CHF (congestive heart failure) (HCC) . Ischemic foot   Consults:   Vascular surgery  cardiology  Recommendations for Outpatient Follow-up:  1. Please repeat CBC/BMET at next visit 2. Velcro wrist forearm splint -right    DIET: heart healthy diet     Allergies:   Allergies  Allergen Reactions  . Codeine Itching and Nausea And Vomiting     DISCHARGE MEDICATIONS: Current Discharge Medication List    START taking these medications   Details  doxycycline (VIBRA-TABS) 100 MG tablet Take 1 tablet (100 mg total) by mouth every 12 (twelve) hours. For 7 more days Qty: 14 tablet, Refills: 0    isosorbide mononitrate (IMDUR) 30 MG 24 hr tablet Take 1 tablet (30 mg total) by mouth daily. Qty: 30 tablet, Refills: 0      CONTINUE these medications which have CHANGED   Details  HYDROcodone-acetaminophen (NORCO/VICODIN) 5-325 MG tablet Take 0.5 to 1 tablet by mouth every 6 (six) hours as needed for severe pain. Qty: 10 tablet, Refills: 0      CONTINUE these medications which have NOT CHANGED   Details  acetaminophen (TYLENOL) 500 MG tablet Take 500 mg by mouth every 4 (four) hours as needed for mild pain, moderate pain, fever or headache.    alum & mag hydroxide-simeth (MINTOX) 200-200-20 MG/5ML suspension Take 30 mLs by mouth every 6 (six) hours as needed for indigestion or heartburn.    aspirin EC 81 MG tablet Take 81 mg by mouth daily.    busPIRone (BUSPAR) 15 MG tablet Take 15 mg by mouth 2 (two) times daily.     calcium-vitamin D (OSCAL WITH D) 500-200 MG-UNIT tablet Take 1 tablet by mouth 2 (two) times daily.    cetirizine (ZYRTEC) 10 MG tablet Take 10 mg by mouth daily.    diphenhydrAMINE-zinc acetate (BENADRYL) cream Apply 1 application topically 2 (two) times daily.    famotidine (PEPCID) 20 MG tablet Take 20 mg by mouth at bedtime.     furosemide (LASIX) 20 MG tablet Take 20 mg by mouth daily.    guaifenesin (ROBITUSSIN) 100 MG/5ML syrup Take 200 mg by mouth every 6 (six) hours as needed for cough.     Ipratropium-Albuterol (COMBIVENT RESPIMAT) 20-100 MCG/ACT AERS respimat Inhale 1 puff into the lungs every 6 (six) hours as needed for wheezing or shortness of breath.     levothyroxine (SYNTHROID, LEVOTHROID) 25 MCG tablet Take 37.5 mcg by mouth daily before breakfast.    loperamide (IMODIUM) 2 MG capsule Take 2 mg by mouth as needed for diarrhea or loose stools.     losartan (COZAAR) 100 MG tablet Take 100 mg by mouth daily.    magnesium hydroxide (MILK OF MAGNESIA) 400 MG/5ML suspension Take 30 mLs by mouth at bedtime as needed for mild constipation.     metoprolol succinate (TOPROL-XL) 25 MG 24 hr tablet Take 25 mg by mouth at bedtime.    Neomycin-Bacitracin-Polymyxin (TRIPLE ANTIBIOTIC) [phone] OINT Apply 1 application topically as needed (for minor skin tears/abrasions).    nitroGLYCERIN (NITROSTAT) 0.4 MG SL tablet Place 0.4 mg under the tongue every  5 (five) minutes as needed for chest pain.     omega-3 acid ethyl esters (LOVAZA) 1 G capsule Take 2 g by mouth daily.    PARoxetine (PAXIL) 30 MG tablet Take 30 mg by mouth daily.    polyvinyl alcohol (LIQUIFILM TEARS) 1.4 % ophthalmic solution Place 1 drop into both eyes 4 (four) times daily.     potassium chloride SA (K-DUR,KLOR-CON) 20 MEQ tablet Take 20 mEq by mouth daily.    ranolazine (RANEXA) 500 MG 12 hr tablet Take 1 tablet (500 mg total) by mouth 2 (two) times daily. Qty: 60 tablet, Refills: 5    rivastigmine  (EXELON) 4.6 mg/24hr Place 4.6 mg onto the skin daily.    senna (SENOKOT) 8.6 MG tablet Take 1 tablet by mouth at bedtime.    simvastatin (ZOCOR) 10 MG tablet Take 10 mg by mouth at bedtime.     traZODone (DESYREL) 50 MG tablet Take 75 mg by mouth at bedtime.    albuterol (PROVENTIL HFA;VENTOLIN HFA) 108 (90 BASE) MCG/ACT inhaler Inhale 1-2 puffs into the lungs every 6 (six) hours as needed for wheezing. Qty: 1 Inhaler, Refills: 0    UNABLE TO FIND Take 1 each by mouth 3 (three) times daily. Med Name: Wyline Mood      STOP taking these medications     amoxicillin (AMOXIL) 500 MG capsule      apixaban (ELIQUIS) 2.5 MG TABS tablet          Brief H and P: For complete details please refer to admission H and P, but in brief 82 y.o.femalewith medical history significant for vascular dementia, coronary artery disease s/p CABG, PAD status-post bifemoral bypass, chronic diastolic CHF, and atrial fibrillation on Eliquis, now presenting from her SNF for evaluation of bilateral lower extremity edema and foot pain.Patient's bilateral feet were noted to be cold and pulses could not be identified with palpation or with Doppler. Vascular surgery was consulted by the ED physician and advised medical admission to [facility] for further evaluation and management.  Hospital Course:   Bilateral painful feet - Pt has history of PAD with bifemoral bypass grating in 2014  - She presented from SNF for BLE edema, reportedly developing over the past day notes that there had been severe pain in both feet - Vascular surgery was consulted -Patent axillofemoral and femorofemoral bypasses. Good Doppler flow to her feet, no evidence of ischemia, vascular has signed off  Right lower leg wound with cellulitis - Pt has two wounds on anterior right shin, the smaller one with expression of pus and surrounding erythema/edema  - Radiographs feature soft-tissue swelling, but no acute bony abnormality  -  There is minimal leukocytosis, but no fever or end-organ damage  - She was treated with IV clindamycin in ED, plan to continue doxycycline BID for 7 additional days - Wound care was consulted  Abdominal pain on 12/22 -Improved, abdominal x-rays showed no acute abnormality.   Acute on chronic diastolic CHF  - TTE (10/03/15) with EF 55-60%, mod-sev MR, mod TR, and severe LAE  - Pt presented with increased BLE edema and mild interstitial edema is noted on CXR  - She is saturating well on room air and not in any respiratory distress  - Continue Lasix, metoprolol, cardiology was consulted and signed off 12/22  Atrial fibrillation  - Pt is in rate-controlled a fib on admission  - CHADS-VASc ~8 (age x2, hx CVA x2, CHF, CAD, HTN, gender)  - She is anticoagulated  with Eliquis, but has suffered numerous falls in the past few months at the nursing home and presented with bruising and scrapes throughout; may need to re-weigh risk/benefit. Cardiology recommended discontinuing Eliquis, discontinued on discharge. Cardiology also discussed in detail with the daughter who understands that this will increase the risk of stroke.  CAD - chest pain resolved.  - Pt is s/p CABG with occluded grafts on subsequent cath  - Continue ASA 81, Toprol, Lovaza as tolerated, losartan  - Per cardiology, add Imdur 30 mg daily. She is not a good candidate for invasive and interventional procedures.   Hypertension  - Managed at home with Toprol and losartan  Recent enterococcal urinary tract infection - Repeat UA negative  Possible radial neckfracture -right  - XRay 12/15 Irregularity of the radial neck could be due to degenerative spurring versus nondisplaced fracture -follow with ortho outpatient, Dr Elvera LennoxGherghe discussed with Dr. Madelon Lipsaffrey with whom patient was supposed to follow up on 12/21, no acute needs, follow-up outpatient in 2 weeks   Day of Discharge BP 119/74 (BP Location: Right Arm)   Pulse 74   Temp  98 F (36.7 C) (Oral)   Resp 16   Wt 50.8 kg (112 lb)   SpO2 95%   BMI 21.16 kg/m   Physical Exam: General: Alert and awake oriented x3 not in any acute distress. HEENT: anicteric sclera, pupils reactive to light and accommodation CVS: S1-S2 clear no murmur rubs or gallops Chest: clear to auscultation bilaterally, no wheezing rales or rhonchi Abdomen: soft nontender, nondistended, normal bowel sounds Extremities: no cyanosis, clubbing or edema noted bilaterally Neuro: Cranial nerves II-XII intact, no focal neurological deficits   The results of significant diagnostics from this hospitalization (including imaging, microbiology, ancillary and laboratory) are listed below for reference.    LAB RESULTS: Basic Metabolic Panel:  Recent Labs Lab 05/04/16 0003 05/05/16 0413  NA 133* 136  K 3.4* 3.2*  CL 98* 100*  CO2 27 29  GLUCOSE 102* 97  BUN 11 8  CREATININE 0.98 0.88  CALCIUM 8.4* 8.3*   Liver Function Tests:  Recent Labs Lab 05/02/16 1643 05/04/16 0003  AST 32 26  ALT 16 12*  ALKPHOS 136* 103  BILITOT 1.7* 1.3*  PROT 7.3 5.4*  ALBUMIN 3.8 2.9*   No results for input(s): LIPASE, AMYLASE in the last 168 hours. No results for input(s): AMMONIA in the last 168 hours. CBC:  Recent Labs Lab 05/02/16 1643  05/04/16 0003 05/05/16 0413  WBC 10.8*  --  10.1 8.6  NEUTROABS 8.2*  --   --   --   HGB 13.4  --  11.0* 10.4*  HCT 42.4  --  35.1* 32.3*  MCV 86.0  --  85.6 83.7  PLT 261  < > 193 202  < > = values in this interval not displayed. Cardiac Enzymes:  Recent Labs Lab 05/03/16 1840 05/04/16 0003  TROPONINI 0.03* 0.03*   BNP: Invalid input(s): POCBNP CBG:  Recent Labs Lab 05/05/16 0650 05/06/16 0609  GLUCAP 100* 122*    Significant Diagnostic Studies:  Dg Chest 2 View  Result Date: 05/02/2016 CLINICAL DATA:  Leg swelling EXAM: CHEST  2 VIEW COMPARISON:  04/29/2016 FINDINGS: Cardiomegaly is noted. Status post median sternotomy. Mild  interstitial prominence bilaterally suspicious for mild interstitial edema. Trace bilateral pleural effusion with basilar atelectasis. No segmental infiltrate. Thoracic spine osteopenia. IMPRESSION: Cardiomegaly. Mild interstitial prominence bilateral suspicious for mild interstitial edema. No segmental infiltrate. Trace bilateral pleural effusion with bilateral basilar  atelectasis. Electronically Signed   By: Natasha Mead M.D.   On: 05/02/2016 17:20   Dg Tibia/fibula Right  Result Date: 05/02/2016 CLINICAL DATA:  Fall with leg swelling and purulent drainage. EXAM: RIGHT TIBIA AND FIBULA - 2 VIEW COMPARISON:  None. FINDINGS: Nonspecific soft tissue swelling about the leg. There are changes of venous harvesting in the calf. Total knee arthroplasty which is located. Osteopenia. No acute fracture or subluxation. IMPRESSION: Soft tissue swelling without acute osseous finding. Electronically Signed   By: Marnee Spring M.D.   On: 05/02/2016 17:22   Ct Head Wo Contrast  Result Date: 05/02/2016 CLINICAL DATA:  Multiple recent falls. EXAM: CT HEAD WITHOUT CONTRAST TECHNIQUE: Contiguous axial images were obtained from the base of the skull through the vertex without intravenous contrast. COMPARISON:  04/29/2016 FINDINGS: Brain: There is no evidence for acute hemorrhage, hydrocephalus, mass lesion, or abnormal extra-axial fluid collection. No definite CT evidence for acute infarction. Diffuse loss of parenchymal volume is consistent with atrophy. Patchy low attenuation in the deep hemispheric and periventricular white matter is nonspecific, but likely reflects chronic microvascular ischemic demyelination. Vascular: Atherosclerotic calcification is visualized in the carotid arteries. No dense MCA sign. Major dural sinuses are unremarkable. Skull: No evidence for fracture. No worrisome lytic or sclerotic lesion. Sinuses/Orbits: The visualized paranasal sinuses and mastoid air cells are clear. Visualized portions of the  globes and intraorbital fat are unremarkable. Other: Scalp contusion identified high right parietal region. IMPRESSION: 1. No acute intracranial abnormality. 2. Atrophy with chronic small vessel white matter ischemic disease. 3. Scalp contusion high right parietal region. Electronically Signed   By: Kennith Center M.D.   On: 05/02/2016 18:03   Dg Chest Port 1 View  Result Date: 05/03/2016 CLINICAL DATA:  Chest pain EXAM: PORTABLE CHEST 1 VIEW COMPARISON:  05/02/2016 FINDINGS: Cardiac shadow remains enlarged. Postsurgical changes are again seen. The lungs are well aerated bilaterally. Mild bronchitic changes are seen without focal confluent infiltrate. No sizable effusion is noted. IMPRESSION: Mild interstitial changes without acute abnormality. Electronically Signed   By: Alcide Clever M.D.   On: 05/03/2016 10:04    2D ECHO:   Disposition and Follow-up: Discharge Instructions    Diet - low sodium heart healthy    Complete by:  As directed    Increase activity slowly    Complete by:  As directed        DISPOSITION: SNF   DISCHARGE FOLLOW-UP Follow-up Information    MAZZOCCHI, Rise Mu, MD. Schedule an appointment as soon as possible for a visit in 2 week(s).   Specialty:  Family Medicine Contact information: [address] [place] [address] [phone]        CAFFREY JR,W D, MD. Schedule an appointment as soon as possible for a visit in 2 week(s).   Specialty:  Orthopedic Surgery Contact information: [address] [place] [address] [phone]            Time spent on Discharge:   Signed:   Tysean Vandervliet M.D. [facility] 05/06/2016, 12:26 PM Pager: [phone]

## 2016-05-06 NOTE — [place] FL2 (Signed)
[place] MEDICAID FL2 LEVEL OF CARE SCREENING TOOL     IDENTIFICATION  Patient Name: [name] Short Birthdate: 11-16-1927 Sex: female Admission Date (Current Location): 05/02/2016  [facility] [place]   Facility and Address:  [facility] [place], [address], [place], [place] [address]      Provider Number: [id number]  Attending Physician Name and Address:  Cathren Harshipudeep K Rai, MD  Relative Name and Phone Number:  Dtr Debbie [phone]    Current Level of Care: Hospital Recommended Level of Care: Skilled Nursing Facility, Memory Care Prior Approval Number:    Date Approved/Denied:   PASRR Number: [id number]  Discharge Plan: SNF    Current Diagnoses: Patient Active Problem List   Diagnosis Date Noted  . Cellulitis 05/02/2016  . PAD (peripheral artery disease) (HCC) 05/02/2016  . Acute on chronic diastolic CHF (congestive heart failure) (HCC) 05/02/2016  . Ischemic foot 05/02/2016  . Cellulitis of right lower extremity   . Leg edema   . Acute CVA (cerebrovascular accident) (HCC) 10/03/2015  . Cerebellar stroke (HCC)   . Paroxysmal atrial fibrillation (HCC)   . HLD (hyperlipidemia)   . Stroke (HCC) 10/02/2015  . Acute embolic stroke (HCC) 10/02/2015  . Stroke (cerebrum) (HCC) 10/02/2015  . Acute respiratory failure with hypoxia (HCC) 10/02/2015  . Essential hypertension 03/12/2013  . Atherosclerosis of native arteries of the extremities with intermittent claudication 11/04/2012  . Preoperative clearance 09/25/2012  . CAD (coronary artery disease) of artery bypass graft 09/25/2012  . Peripheral vascular disease, unspecified 09/23/2012  . Aftercare following surgery of the circulatory system, NEC 09/23/2012  . Chest pain 08/11/2012  . Chronic atrial fibrillation- ventricular rate is controlled 08/11/2012  . Dyslipidemia 06/03/2012  . PVD (peripheral vascular disease), AOBF '94 06/03/2012  . NSVT, EF 60-65% by 2D this admission, beta  blocker increased 06/03/2012  . CAD, CABG X 2 '93- Last cath 05/2012- no change from 2011, medical Rx- EF of 60-65% via echo 05/2012 06/03/2012  . CHF (congestive heart failure) - mild, BNP 3K 05/31/2012  . Unstable angina (HCC) 05/31/2012  . Diabetes mellitus type II, controlled (HCC) 05/31/2012  . Atrial fibrillation with controlled ventricular response (HCC) 05/31/2012    Orientation RESPIRATION BLADDER Height & Weight     Self  Normal Continent Weight: 112 lb (50.8 kg) Height:     BEHAVIORAL SYMPTOMS/MOOD NEUROLOGICAL BOWEL NUTRITION STATUS      Continent    AMBULATORY STATUS COMMUNICATION OF NEEDS Skin   Limited Assist Verbally Normal                       Personal Care Assistance Level of Assistance  Bathing, Dressing Bathing Assistance: Limited assistance   Dressing Assistance: Limited assistance     Functional Limitations Info             SPECIAL CARE FACTORS FREQUENCY  PT (By licensed PT), OT (By licensed OT)     PT Frequency: 5x wk OT Frequency: 5x wk            Contractures Contractures Info: Not present    Additional Factors Info  Code Status, Allergies Code Status Info: DNR Allergies Info: Codeine           Current Medications (05/06/2016):  This is the current hospital active medication list Current Facility-Administered Medications  Medication Dose Route Frequency Provider Last Rate Last Dose  . 0.9 %  sodium chloride infusion  250 mL Intravenous  PRN Briscoe Deutscher, MD      . acetaminophen (TYLENOL) tablet 500 mg  500 mg Oral Q4H PRN Briscoe Deutscher, MD      . alum & mag hydroxide-simeth (MAALOX/MYLANTA) 200-200-20 MG/5ML suspension 30 mL  30 mL Oral Q6H PRN Briscoe Deutscher, MD      . apixaban (ELIQUIS) tablet 2.5 mg  2.5 mg Oral BID Briscoe Deutscher, MD   2.5 mg at 05/06/16 1023  . aspirin EC tablet 81 mg  81 mg Oral Daily Lavone Neri Opyd, MD   81 mg at 05/06/16 1023  . busPIRone (BUSPAR) tablet 15 mg  15 mg Oral BID Briscoe Deutscher, MD   15  mg at 05/06/16 1023  . calcium-vitamin D (OSCAL WITH D) 500-200 MG-UNIT per tablet 1 tablet  1 tablet Oral BID Briscoe Deutscher, MD   1 tablet at 05/06/16 1023  . Chlorhexidine Gluconate Cloth 2 % PADS 6 each  6 each Topical Q0600 Briscoe Deutscher, MD   6 each at 05/06/16 0600  . collagenase (SANTYL) ointment   Topical Daily Richarda Overlie, MD      . doxycycline (VIBRA-TABS) tablet 100 mg  100 mg Oral Q12H Lavone Neri Opyd, MD   100 mg at 05/06/16 1023  . famotidine (PEPCID) tablet 20 mg  20 mg Oral QHS Lavone Neri Opyd, MD   20 mg at 05/05/16 2229  . furosemide (LASIX) tablet 20 mg  20 mg Oral Daily Briscoe Deutscher, MD   20 mg at 05/06/16 1023  . guaifenesin (ROBITUSSIN) 100 MG/5ML syrup 200 mg  200 mg Oral Q6H PRN Briscoe Deutscher, MD      . HYDROcodone-acetaminophen (NORCO/VICODIN) 5-325 MG per tablet 1-2 tablet  1-2 tablet Oral Q6H PRN Briscoe Deutscher, MD   1 tablet at 05/04/16 2327  . ipratropium-albuterol (DUONEB) 0.5-2.5 (3) MG/3ML nebulizer solution 3 mL  3 mL Nebulization Q6H PRN Lavone Neri Opyd, MD      . levothyroxine (SYNTHROID, LEVOTHROID) tablet 37.5 mcg  37.5 mcg Oral QAC breakfast Briscoe Deutscher, MD   37.5 mcg at 05/06/16 0810  . magnesium hydroxide (MILK OF MAGNESIA) suspension 30 mL  30 mL Oral QHS PRN Briscoe Deutscher, MD      . metoprolol succinate (TOPROL-XL) 24 hr tablet 25 mg  25 mg Oral QHS Briscoe Deutscher, MD   25 mg at 05/05/16 2229  . mupirocin ointment (BACTROBAN) 2 % 1 application  1 application Nasal BID Briscoe Deutscher, MD   1 application at 05/06/16 1027  . nitroGLYCERIN (NITRODUR - Dosed in mg/24 hr) patch 0.2 mg  0.2 mg Transdermal Daily Richarda Overlie, MD   0.2 mg at 05/06/16 1000  . omega-3 acid ethyl esters (LOVAZA) capsule 2 g  2 g Oral Daily Briscoe Deutscher, MD   2 g at 05/05/16 0944  . ondansetron (ZOFRAN) tablet 4 mg  4 mg Oral Q6H PRN Briscoe Deutscher, MD       Or  . ondansetron (ZOFRAN) injection 4 mg  4 mg Intravenous Q6H PRN Briscoe Deutscher, MD      . PARoxetine (PAXIL) tablet  30 mg  30 mg Oral Daily Briscoe Deutscher, MD   30 mg at 05/06/16 1027  . polyvinyl alcohol (LIQUIFILM TEARS) 1.4 % ophthalmic solution 1 drop  1 drop Both Eyes QID Briscoe Deutscher, MD   1 drop at 05/06/16 1028  . ranolazine (RANEXA) 12 hr tablet 500  mg  500 mg Oral BID Briscoe Deutscherimothy S Opyd, MD   500 mg at 05/06/16 1023  . rivastigmine (EXELON) 4.6 mg/24hr 4.6 mg  4.6 mg Transdermal Daily Lavone Neriimothy S Opyd, MD   4.6 mg at 05/06/16 1027  . senna (SENOKOT) tablet 8.6 mg  1 tablet Oral QHS Lavone Neriimothy S Opyd, MD   8.6 mg at 05/05/16 2229  . simvastatin (ZOCOR) tablet 10 mg  10 mg Oral QHS Briscoe Deutscherimothy S Opyd, MD   10 mg at 05/05/16 2229  . sodium chloride flush (NS) 0.9 % injection 3 mL  3 mL Intravenous Q12H Briscoe Deutscherimothy S Opyd, MD   3 mL at 05/06/16 1028  . sodium chloride flush (NS) 0.9 % injection 3 mL  3 mL Intravenous Q12H Briscoe Deutscherimothy S Opyd, MD   3 mL at 05/05/16 0948  . sodium chloride flush (NS) 0.9 % injection 3 mL  3 mL Intravenous PRN Briscoe Deutscherimothy S Opyd, MD   3 mL at 05/05/16 2230  . traZODone (DESYREL) tablet 75 mg  75 mg Oral QHS Briscoe Deutscherimothy S Opyd, MD   75 mg at 05/05/16 2229     Discharge Medications: Please see discharge summary for a list of discharge medications.  Relevant Imaging Results:  Relevant Lab Results:   Additional Information    Olar Santini B, LCSWA

## 2016-05-06 NOTE — Discharge Planning (Signed)
Pt on observation, medically cleared to DC yesterday.  [facility] [phone]. Tammy advised CSW that she received a call on Friday from pt's dtr stating that pt would not be returning to the facility. Dtr reported that she was advised by MD that pt needed a higher level of care. Per Tammy, as a result from the call pt was DC from the facility and all meds were returned to pharmacy. [facility].  CSW updated dtr about info received, dtr reports that she did not tell facility this. CSW advised dtr that she would try to find SNF facility today or pt would have to return home and the family could work on placement next week.   CSW received PASRR, faxed out in the hub to additional facilities. [facility]. Dtr Eunice Blaseebbie updated via phone. Dtr Eber Jonesarolyn in room updated. [facility] transport set. All docs sent to facility, transport forms on pt's chart.  Pt has no other needs at this time. CSW is signing off.  Elenie Coven B. Gean QuintBrown,MSW, LCSWA Clinical Social Work Dept Weekend Social Worker [phone] 12:29 PM

## 2016-05-06 NOTE — Progress Notes (Signed)
[facility]                                                                              Patient Demographics  [name] SharpsMary Short, is a 82 y.o. female, DOB - 06/18/1927, OZH:[id number]  Admit date - 05/02/2016   Admitting Physician Briscoe Deutscherimothy S Opyd, MD  Outpatient Primary MD for the patient is MAZZOCCHI, Rise MuANNMARIE, MD  Outpatient specialists:   LOS - 0  days    Chief Complaint  Patient presents with  . Leg Swelling       Brief summary   82 y.o.femalewith medical history significant for vascular dementia, coronary artery disease s/p CABG, PAD status-post bifemoral bypass, chronic diastolic CHF, and atrial fibrillation on Eliquis, now presenting from her SNF for evaluation of bilateral lower extremity edema and foot pain.Patient's bilateral feet were noted to be cold and pulses could not be identified with palpation or with Doppler. Vascular surgery was consulted by the ED physician and [facility] and [facility]   Assessment & Plan      Bilateral painful feet - Pt has history of PAD with bifemoral bypass grating in 2014  - She presented from SNF for BLE edema, reportedly developing over the past day notes that there had been severe pain in both feet - Vascular surgery was consulted -Patent axillofemoral and femorofemoral bypasses. Good Doppler flow to her feet, no evidence of ischemia, vascular has signed off  Right lower leg wound with cellulitis - Pt has two wounds on anterior right shin, the smaller one with expression of pus and surrounding erythema/edema  - Radiographs feature soft-tissue swelling, but no acute bony abnormality  - There is minimal leukocytosis, but no fever or end-organ damage  - She was treated with IV clindamycin in ED, plan to continue doxycycline BID for 7 additional days - Wound care consulted  Abdominal pain on 12/22 -Improved, abdominal x-rays showed no acute abnormality.    Acute on chronic diastolic CHF  - TTE (10/03/15) with EF 55-60%, mod-sev MR, mod TR, and severe LAE  - Pt presented with increased BLE edema and mild interstitial edema is noted on CXR  - She is saturating well on rm air and not in any respiratory distress  - Continue Lasix, metoprolol, cardiology consulted and signed off 12/22  Atrial fibrillation  - Pt is in rate-controlled a fib on admission  - CHADS-VASc ~8 (age x2, hx CVA x2, CHF, CAD, HTN, gender)  - She is anticoagulated with Eliquis, but has suffered numerous falls in the past few months at the nursing home and presents with bruising and scrapes throughout; may need to re-weigh risk/benefit. Cardiology recommended discontinuing Eliquis, will dc on discharge. Cardiology also discussed in detail with the daughter who understands that this will increase the risk of stroke.  CAD - chest pain resolved.  - Pt is s/p CABG with occluded grafts on subsequent cath  - Continue ASA 81, Toprol, Lovaza as tolerated; hold losartan for now while diuresing, resume as appropriate - Per cardiology, add Imdur 30 mg daily. She is not a good candidate for invasive and interventional procedures.  Hypertension  - Managed at home with Toprol and losartan - As above, Toprol has been continued and losartan held on admission   Recent enterococcal urinary tract infection - Repeat UA negative  Possible radial neck fracture  - XR 12/15 Irregularity of the radial neck could be due to degenerative spurring versus nondisplaced fracture -follow with ortho outpatient, Dr Elvera Lennox discussed with Dr. Madelon Lips with whom patient was supposed to follow up on 12/21, no acute needs  Dispo: patient currently lives in memory unit without SNF level care, she has had several falls recently, suspect that she will need higher level of care on d/c. PT evaluation done today on 12/23 recommended skilled nursing facility.  Code Status: DO NOT RESUSCITATE DVT  Prophylaxis: eliquis Family Communication: Discussed in detail with the patient, all imaging results, lab results explained to the patient   Disposition Plan: DC to facility in a.m.  Time Spent in minutes    Procedures:    Consultants:   Cardiology  Antimicrobials:    None  Medications  Scheduled Meds: . apixaban  2.5 mg Oral BID  . aspirin EC  81 mg Oral Daily  . busPIRone  15 mg Oral BID  . calcium-vitamin D  1 tablet Oral BID  . Chlorhexidine Gluconate Cloth  6 each Topical Q0600  . collagenase   Topical Daily  . doxycycline  100 mg Oral Q12H  . famotidine  20 mg Oral QHS  . furosemide  20 mg Oral Daily  . levothyroxine  37.5 mcg Oral QAC breakfast  . metoprolol succinate  25 mg Oral QHS  . mupirocin ointment  1 application Nasal BID  . nitroGLYCERIN  0.2 mg Transdermal Daily  . omega-3 acid ethyl esters  2 g Oral Daily  . PARoxetine  30 mg Oral Daily  . polyvinyl alcohol  1 drop Both Eyes QID  . ranolazine  500 mg Oral BID  . rivastigmine  4.6 mg Transdermal Daily  . senna  1 tablet Oral QHS  . simvastatin  10 mg Oral QHS  . sodium chloride flush  3 mL Intravenous Q12H  . sodium chloride flush  3 mL Intravenous Q12H  . traZODone  75 mg Oral QHS   Continuous Infusions: PRN Meds:.sodium chloride, acetaminophen, alum & mag hydroxide-simeth, guaifenesin, HYDROcodone-acetaminophen, ipratropium-albuterol, magnesium hydroxide, ondansetron **OR** ondansetron (ZOFRAN) IV, sodium chloride flush   Antibiotics   Anti-infectives    Start     Dose/Rate Route Frequency Ordered Stop   05/04/16 0000  doxycycline (VIBRA-TABS) 100 MG tablet     100 mg Oral Every 12 hours 05/04/16 1329     05/02/16 2200  doxycycline (VIBRA-TABS) tablet 100 mg     100 mg Oral Every 12 hours 05/02/16 1958     05/02/16 1700  clindamycin (CLEOCIN) IVPB 900 mg     900 mg 100 mL/hr over 30 Minutes Intravenous  Once 05/02/16 1645 05/02/16 1803        Subjective:   [name] Short was  seen and examined today. States slept well, no acute complaints. Denies any specific complaints today. Patient denies dizziness, chest pain, shortness of breath, abdominal pain, N/V/D/C, new weakness, numbess, tingling. No acute events overnight.    Objective:   Vitals:   05/05/16 0954 05/05/16 1220 05/05/16 2030 05/06/16 0500  BP:  (!) 135/94 140/90 119/74  Pulse: 88 (!) 46 99 74  Resp:  18 16 16   Temp:  97 F (36.1 C) 98.4 F (36.9 C) 98 F (36.7 C)  TempSrc:  Oral Oral Oral  SpO2: 98% 100% 94% 95%  Weight:    50.8 kg (112 lb)    Intake/Output Summary (Last 24 hours) at 05/06/16 1010 Last data filed at 05/06/16 0730  Gross per 24 hour  Intake              602 ml  Output                0 ml  Net              602 ml     Wt Readings from Last 3 Encounters:  05/06/16 50.8 kg (112 lb)  04/16/16 45.8 kg (101 lb)  04/04/16 54.4 kg (120 lb)     Exam  General: Alert and oriented x 3, NAD  HEENT:    Neck:   Cardiovascular: S1 S2 clear, RRR   Respiratory: Clear to auscultation bilaterally, no wheezing, rales or rhonchi  Gastrointestinal: Soft, nontender, nondistended, + bowel sounds  Ext: no cyanosis clubbing or edema  Neuro: no new deficits  Skin: No rashes  Psych: Normal affect and demeanor, alert and oriented x3    Data Reviewed:  I have personally reviewed following labs and imaging studies  Micro Results Recent Results (from the past 240 hour(s))  Urine culture     Status: Abnormal   Collection Time: 04/27/16 10:17 AM  Result Value Ref Range Status   Specimen Description URINE, CATHETERIZED  Final   Special Requests NONE  Final   Culture >=100,000 COLONIES/mL ENTEROCOCCUS FAECALIS (A)  Final   Report Status 04/29/2016 FINAL  Final   Organism ID, Bacteria ENTEROCOCCUS FAECALIS (A)  Final      Susceptibility   Enterococcus faecalis - MIC*    AMPICILLIN <=2 SENSITIVE Sensitive     LEVOFLOXACIN 1 SENSITIVE Sensitive     NITROFURANTOIN <=16 SENSITIVE  Sensitive     VANCOMYCIN 1 SENSITIVE Sensitive     * >=100,000 COLONIES/mL ENTEROCOCCUS FAECALIS  MRSA PCR Screening     Status: Abnormal   Collection Time: 05/02/16 10:58 PM  Result Value Ref Range Status   MRSA by PCR POSITIVE (A) NEGATIVE Final    Comment:        The GeneXpert MRSA Assay (FDA approved for NASAL specimens only), is one component of a comprehensive MRSA colonization surveillance program. It is not intended to diagnose MRSA infection nor to guide or monitor treatment for MRSA infections. RESULT CALLED TO, READ BACK BY AND VERIFIED WITH: A SLOANE,RN @0233  05/03/16 MKELLY,MLT     Radiology Reports Dg Chest 2 View  Result Date: 05/02/2016 CLINICAL DATA:  Leg swelling EXAM: CHEST  2 VIEW COMPARISON:  04/29/2016 FINDINGS: Cardiomegaly is noted. Status post median sternotomy. Mild interstitial prominence bilaterally suspicious for mild interstitial edema. Trace bilateral pleural effusion with basilar atelectasis. No segmental infiltrate. Thoracic spine osteopenia. IMPRESSION: Cardiomegaly. Mild interstitial prominence bilateral suspicious for mild interstitial edema. No segmental infiltrate. Trace bilateral pleural effusion with bilateral basilar atelectasis. Electronically Signed   By: Natasha MeadLiviu  Pop M.D.   On: 05/02/2016 17:20   Dg Chest 2 View  Result Date: 04/29/2016 CLINICAL DATA:  Larey SeatFell.  Right hip pain. EXAM: CHEST  2 VIEW COMPARISON:  04/27/2016 FINDINGS: Previous median sternotomy and CABG. Chronic cardiomegaly. Lungs are clear. No edema, infiltrate, collapse or effusion. No thoracic bone trauma. IMPRESSION: Chronic cardiomegaly.  No active disease. Electronically Signed   By: Paulina FusiMark  Shogry M.D.   On: 04/29/2016 06:26   Dg Chest 2 View  Result Date: 04/27/2016 CLINICAL DATA:  Unwitnessed fall. EXAM: CHEST  2 VIEW COMPARISON:  Radiographs of April 04, 2016. FINDINGS: Stable cardiomegaly. Status post coronary artery bypass graft. No pneumothorax is noted. Probable  mild left pleural effusion is noted with adjacent subsegmental atelectasis. Atherosclerosis of thoracic aorta is noted. Bony thorax is unremarkable. IMPRESSION: Probable mild left basilar subsegmental atelectasis with associated pleural effusion. Electronically Signed   By: Lupita Raider, M.D.   On: 04/27/2016 10:08   Dg Pelvis 1-2 Views  Result Date: 04/29/2016 CLINICAL DATA:  Larey Seat.  Right hip pain. EXAM: PELVIS - 1-2 VIEW COMPARISON:  04/27/2016 FINDINGS: There is no evidence of pelvic fracture or diastasis. No pelvic bone lesions are seen. IMPRESSION: Negative. Electronically Signed   By: Paulina Fusi M.D.   On: 04/29/2016 06:28   Dg Pelvis 1-2 Views  Result Date: 04/27/2016 CLINICAL DATA:  Fall. EXAM: PELVIS - 1-2 VIEW COMPARISON:  CT pelvis 04/03/2016 FINDINGS: There is no evidence of pelvic fracture or diastasis. No pelvic bone lesions are seen. Surgical clips in the groin bilaterally from prior vascular bypass grafting. IMPRESSION: Negative. Electronically Signed   By: Marlan Palau M.D.   On: 04/27/2016 10:08   Dg Forearm Right  Result Date: 04/27/2016 CLINICAL DATA:  Fall. EXAM: RIGHT FOREARM - 2 VIEW COMPARISON:  None. FINDINGS: Irregularity of the radial neck could be due to degenerative spurring versus nondisplaced fracture. Correlate with pain and follow-up x-rays. Degenerative change in the elbow joint. Soft tissue calcification ventral to the distal humerus. Degenerative change in the wrist joint with chondrocalcinosis. IMPRESSION: Cannot exclude radial neck fracture. Correlate with pain in follow-up x-rays if appropriate. Electronically Signed   By: Marlan Palau M.D.   On: 04/27/2016 10:07   Dg Tibia/fibula Right  Result Date: 05/02/2016 CLINICAL DATA:  Fall with leg swelling and purulent drainage. EXAM: RIGHT TIBIA AND FIBULA - 2 VIEW COMPARISON:  None. FINDINGS: Nonspecific soft tissue swelling about the leg. There are changes of venous harvesting in the calf. Total knee  arthroplasty which is located. Osteopenia. No acute fracture or subluxation. IMPRESSION: Soft tissue swelling without acute osseous finding. Electronically Signed   By: Marnee Spring M.D.   On: 05/02/2016 17:22   Ct Head Wo Contrast  Result Date: 05/02/2016 CLINICAL DATA:  Multiple recent falls. EXAM: CT HEAD WITHOUT CONTRAST TECHNIQUE: Contiguous axial images were obtained from the base of the skull through the vertex without intravenous contrast. COMPARISON:  04/29/2016 FINDINGS: Brain: There is no evidence for acute hemorrhage, hydrocephalus, mass lesion, or abnormal extra-axial fluid collection. No definite CT evidence for acute infarction. Diffuse loss of parenchymal volume is consistent with atrophy. Patchy low attenuation in the deep hemispheric and periventricular white matter is nonspecific, but likely reflects chronic microvascular ischemic demyelination. Vascular: Atherosclerotic calcification is visualized in the carotid arteries. No dense MCA sign. Major dural sinuses are unremarkable. Skull: No evidence for fracture. No worrisome lytic or sclerotic lesion. Sinuses/Orbits: The visualized paranasal sinuses and mastoid air cells are clear. Visualized portions of the globes and intraorbital fat are unremarkable. Other: Scalp contusion identified high right parietal region. IMPRESSION: 1. No acute intracranial abnormality. 2. Atrophy with chronic small vessel white matter ischemic disease. 3. Scalp contusion high right parietal region. Electronically Signed   By: Kennith Center M.D.   On: 05/02/2016 18:03   Ct Head Wo Contrast  Result Date: 04/29/2016 CLINICAL DATA:  Found down her bed, history of multiple falls EXAM: CT HEAD WITHOUT CONTRAST CT CERVICAL SPINE  WITHOUT CONTRAST TECHNIQUE: Multidetector CT imaging of the head and cervical spine was performed following the standard protocol without intravenous contrast. Multiplanar CT image reconstructions of the cervical spine were also generated.  COMPARISON:  04/27/2016 FINDINGS: CT HEAD FINDINGS Brain: No intracranial hemorrhage, mass effect or midline shift. There are motion artifacts. Stable atrophy and chronic white matter disease. Ventricular size is stable from prior exam. No acute cortical infarction. No mass lesion is noted on this unenhanced scan. Vascular: Again noted atherosclerotic calcifications of carotid siphon. Skull: No skull fracture is noted. The examination is limited by motion artifacts. Sinuses/Orbits: No acute findings. Other: Again noted scalp hematoma in right parietal region. CT CERVICAL SPINE FINDINGS Alignment: The alignment is preserved. Skull base and vertebrae: No acute fracture or subluxation. There are degenerative changes C1-C2 articulation. Soft tissues and spinal canal: No prevertebral soft tissue swelling. No spinal canal hematoma. Disc levels: There is disc space flattening with mild anterior and mild posterior spurring at C4-C5 level. Disc space flattening with mild anterior and mild posterior spurring at C6-C7 level. Upper chest: There is no pneumothorax in visualized lung apices. Other: Atherosclerotic calcifications are noted bilateral carotid bifurcation. There is thickening of upper esophageal wall. Gastroesophageal reflux cannot be excluded. Extensive atherosclerotic calcifications of thoracic aorta. IMPRESSION: 1. No acute intracranial abnormality. Stable atrophy and chronic white matter disease. No definite acute cortical infarction. Again noted scalp hematoma in right parietal region. Please note there are motion artifacts. 2. No cervical spine acute fracture or subluxation. Mild degenerative changes as described above. No significant change. Radiation exposure alert: this patient has had an unusual number of CT scans. To prevent further radiation exposure in the future, non radiating studies are recommended such as ultrasound or MRI. Starting 01/27/16 the patient underwent 12 CT scan examinations.  Electronically Signed   By: Natasha Mead M.D.   On: 04/29/2016 09:22   Ct Head Wo Contrast  Result Date: 04/27/2016 CLINICAL DATA:  The patient fell out of bed last night. She was found down. Initial encounter. EXAM: CT HEAD WITHOUT CONTRAST CT CERVICAL SPINE WITHOUT CONTRAST TECHNIQUE: Multidetector CT imaging of the head and cervical spine was performed following the standard protocol without intravenous contrast. Multiplanar CT image reconstructions of the cervical spine were also generated. COMPARISON:  Head and cervical spine CT scans 03/20/2016. Head CT scan 04/25/2016. FINDINGS: CT HEAD FINDINGS Brain: Cortical atrophy and chronic microvascular ischemic change are identified. No evidence of acute intracranial abnormality including hemorrhage, infarct, mass lesion, mass effect, midline shift or abnormal extra-axial fluid collection is identified. No hydrocephalus or pneumocephalus. Vascular: Atherosclerotic vascular disease is seen. Skull: Intact.  No focal lesion. Sinuses/Orbits: Negative. Other: Scalp hematoma over the right parietal bone is identified. CT CERVICAL SPINE FINDINGS Alignment: Maintained. Skull base and vertebrae: No acute fracture or focal lesion. Soft tissues and spinal canal: No prevertebral fluid or swelling. No visible canal hematoma. Disc levels: Scattered facet degenerative change is again seen. Loss of disc space height and endplate spurring appear worst at C4-5 and C6-7. Upper chest: Atherosclerosis noted. Other: None. IMPRESSION: Scalp hematoma over the right parietal bone. No acute intracranial abnormality. No acute abnormality cervical spine. Atrophy and chronic microvascular ischemic change. Atherosclerosis. Electronically Signed   By: Drusilla Kanner M.D.   On: 04/27/2016 10:49   Ct Head Wo Contrast  Result Date: 04/25/2016 CLINICAL DATA:  82 y/o F; fell from wheelchair with injury to back of head and pain. EXAM: CT HEAD WITHOUT CONTRAST TECHNIQUE: Contiguous axial images  were obtained from the base of the skull through the vertex without intravenous contrast. COMPARISON:  04/16/2016 CT of the head. FINDINGS: Brain: No evidence of acute infarction, hemorrhage, hydrocephalus, extra-axial collection or mass lesion/mass effect. Patchy foci of hypoattenuation within subcortical and periventricular white matter are compatible with moderate chronic microvascular ischemic changes. Lucency in the right became in is unchanged consistent with chronic lacunar infarct. Mild brain parenchymal volume loss. Vascular: Extensive calcific atherosclerosis of the cavernous internal carotid arteries. Skull: Right parietal scalp small contusion. No displaced calvarial fracture. Sinuses/Orbits: No acute finding. Underpneumatized frontal sinuses. Bilateral intra-ocular lens replacement. Other: None. IMPRESSION: 1. Right parietal small scalp contusion. No displaced calvarial fracture. 2. No acute intracranial abnormality is identified. 3. Stable brain parenchymal volume loss and chronic microvascular ischemic changes. Electronically Signed   By: Mitzi Hansen M.D.   On: 04/25/2016 23:03   Ct Head Wo Contrast  Result Date: 04/16/2016 CLINICAL DATA:  82 year old female with history of fall with injury to the head today. EXAM: CT HEAD WITHOUT CONTRAST TECHNIQUE: Contiguous axial images were obtained from the base of the skull through the vertex without intravenous contrast. COMPARISON:  Head CT 04/04/2016. FINDINGS: Brain: Mild cerebral atrophy. Patchy and confluent areas of decreased attenuation are noted throughout the deep and periventricular white matter of the cerebral hemispheres bilaterally, compatible with chronic microvascular ischemic disease. Physiologic calcifications in the basal ganglia bilaterally. No evidence of acute infarction, hemorrhage, hydrocephalus, extra-axial collection or mass lesion/mass effect. Vascular: No hyperdense vessel or unexpected calcification. Skull: Normal.  Negative for fracture or focal lesion. Sinuses/Orbits: No acute finding. Other: None. IMPRESSION: 1. No evidence of significant acute traumatic injury to the skull or brain. 2. Mild cerebral atrophy. 3. Chronic microvascular ischemic changes in the cerebral white matter, as above. Electronically Signed   By: Trudie Reed M.D.   On: 04/16/2016 13:03   Ct Chest W Contrast  Result Date: 04/29/2016 CLINICAL DATA:  Fall.  Patient on blood thinners. EXAM: CT CHEST, ABDOMEN, AND PELVIS WITH CONTRAST TECHNIQUE: Multidetector CT imaging of the chest, abdomen and pelvis was performed following the standard protocol during bolus administration of intravenous contrast. CONTRAST:  75mL ISOVUE-300 IOPAMIDOL (ISOVUE-300) INJECTION 61% COMPARISON:  None. FINDINGS: CT CHEST FINDINGS Cardiovascular: Cardiomegaly. Diffuse coronary artery and aortic calcifications. No evidence of aortic aneurysm. Right axillofemoral bypass graft noted which is patent. Mediastinum/Nodes: No mediastinal, hilar, or axillary adenopathy. Lungs/Pleura: Moderate bilateral pleural effusions. No confluent airspace opacities or pneumothorax. Musculoskeletal: Fracture noted through the posterior left twelfth rib. This appears chronic as the bone margins are well corticated. Probable old fracture through the lateral seventh rib. No definite acute bony abnormality. CT ABDOMEN PELVIS FINDINGS Hepatobiliary: No focal hepatic abnormality. Gallbladder unremarkable. Pancreas: No focal abnormality or ductal dilatation. Spleen: No focal abnormality.  Normal size. Adrenals/Urinary Tract: No adrenal abnormality. No focal renal abnormality. No stones or hydronephrosis. Urinary bladder is unremarkable. Stomach/Bowel: Descending colonic and sigmoid diverticulosis. No active diverticulitis. Stomach, large and small bowel decompressed, otherwise grossly unremarkable. 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 There appears to be and included aorto  femoral bypass graft. Fem-fem crossover bypass graft is noted and is patent. Right axillofemoral bypass graft also noted, patent. No evidence of aortic aneurysm. No adenopathy. Reproductive: Prior hysterectomy.  No adnexal masses. Other: No free fluid or free air. Musculoskeletal: No acute bony abnormality or focal bone lesion. IMPRESSION: No evidence of significant traumatic injury in the chest, abdomen or pelvis. Old appearing right lateral seventh rib fracture and  left posterior twelfth rib fracture. No definite acute fracture. Moderate bilateral pleural effusions. Cardiomegaly, coronary artery disease. Aortic atherosclerosis. 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 Patent right axillofemoral bypass and fem-fem crossover bypass graft. Left colonic diverticulosis. Electronically Signed   By: Charlett Nose M.D.   On: 04/29/2016 09:14   Ct Cervical Spine Wo Contrast  Result Date: 04/29/2016 CLINICAL DATA:  Found down her bed, history of multiple falls EXAM: CT HEAD WITHOUT CONTRAST CT CERVICAL SPINE WITHOUT CONTRAST TECHNIQUE: Multidetector CT imaging of the head and cervical spine was performed following the standard protocol without intravenous contrast. Multiplanar CT image reconstructions of the cervical spine were also generated. COMPARISON:  04/27/2016 FINDINGS: CT HEAD FINDINGS Brain: No intracranial hemorrhage, mass effect or midline shift. There are motion artifacts. Stable atrophy and chronic white matter disease. Ventricular size is stable from prior exam. No acute cortical infarction. No mass lesion is noted on this unenhanced scan. Vascular: Again noted atherosclerotic calcifications of carotid siphon. Skull: No skull fracture is noted. The examination is limited by motion artifacts. Sinuses/Orbits: No acute findings. Other: Again noted scalp hematoma in right parietal region. CT CERVICAL SPINE FINDINGS Alignment: The alignment is preserved. Skull base and vertebrae:  No acute fracture or subluxation. There are degenerative changes C1-C2 articulation. Soft tissues and spinal canal: No prevertebral soft tissue swelling. No spinal canal hematoma. Disc levels: There is disc space flattening with mild anterior and mild posterior spurring at C4-C5 level. Disc space flattening with mild anterior and mild posterior spurring at C6-C7 level. Upper chest: There is no pneumothorax in visualized lung apices. Other: Atherosclerotic calcifications are noted bilateral carotid bifurcation. There is thickening of upper esophageal wall. Gastroesophageal reflux cannot be excluded. Extensive atherosclerotic calcifications of thoracic aorta. IMPRESSION: 1. No acute intracranial abnormality. Stable atrophy and chronic white matter disease. No definite acute cortical infarction. Again noted scalp hematoma in right parietal region. Please note there are motion artifacts. 2. No cervical spine acute fracture or subluxation. Mild degenerative changes as described above. No significant change. Radiation exposure alert: this patient has had an unusual number of CT scans. To prevent further radiation exposure in the future, non radiating studies are recommended such as ultrasound or MRI. Starting 01/27/16 the patient underwent 12 CT scan examinations. Electronically Signed   By: Natasha Mead M.D.   On: 04/29/2016 09:22   Ct Cervical Spine Wo Contrast  Result Date: 04/27/2016 CLINICAL DATA:  The patient fell out of bed last night. She was found down. Initial encounter. EXAM: CT HEAD WITHOUT CONTRAST CT CERVICAL SPINE WITHOUT CONTRAST TECHNIQUE: Multidetector CT imaging of the head and cervical spine was performed following the standard protocol without intravenous contrast. Multiplanar CT image reconstructions of the cervical spine were also generated. COMPARISON:  Head and cervical spine CT scans 03/20/2016. Head CT scan 04/25/2016. FINDINGS: CT HEAD FINDINGS Brain: Cortical atrophy and chronic  microvascular ischemic change are identified. No evidence of acute intracranial abnormality including hemorrhage, infarct, mass lesion, mass effect, midline shift or abnormal extra-axial fluid collection is identified. No hydrocephalus or pneumocephalus. Vascular: Atherosclerotic vascular disease is seen. Skull: Intact.  No focal lesion. Sinuses/Orbits: Negative. Other: Scalp hematoma over the right parietal bone is identified. CT CERVICAL SPINE FINDINGS Alignment: Maintained. Skull base and vertebrae: No acute fracture or focal lesion. Soft tissues and spinal canal: No prevertebral fluid or swelling. No visible canal hematoma. Disc levels: Scattered facet degenerative change is again seen. Loss of disc space height and endplate spurring appear  worst at C4-5 and C6-7. Upper chest: Atherosclerosis noted. Other: None. IMPRESSION: Scalp hematoma over the right parietal bone. No acute intracranial abnormality. No acute abnormality cervical spine. Atrophy and chronic microvascular ischemic change. Atherosclerosis. Electronically Signed   By: Drusilla Kanner M.D.   On: 04/27/2016 10:49   Ct Abdomen Pelvis W Contrast  Result Date: 04/29/2016 CLINICAL DATA:  Fall.  Patient on blood thinners. EXAM: CT CHEST, ABDOMEN, AND PELVIS WITH CONTRAST TECHNIQUE: Multidetector CT imaging of the chest, abdomen and pelvis was performed following the standard protocol during bolus administration of intravenous contrast. CONTRAST:  75mL ISOVUE-300 IOPAMIDOL (ISOVUE-300) INJECTION 61% COMPARISON:  None. FINDINGS: CT CHEST FINDINGS Cardiovascular: Cardiomegaly. Diffuse coronary artery and aortic calcifications. No evidence of aortic aneurysm. Right axillofemoral bypass graft noted which is patent. Mediastinum/Nodes: No mediastinal, hilar, or axillary adenopathy. Lungs/Pleura: Moderate bilateral pleural effusions. No confluent airspace opacities or pneumothorax. Musculoskeletal: Fracture noted through the posterior left twelfth rib.  This appears chronic as the bone margins are well corticated. Probable old fracture through the lateral seventh rib. No definite acute bony abnormality. CT ABDOMEN PELVIS FINDINGS Hepatobiliary: No focal hepatic abnormality. Gallbladder unremarkable. Pancreas: No focal abnormality or ductal dilatation. Spleen: No focal abnormality.  Normal size. Adrenals/Urinary Tract: No adrenal abnormality. No focal renal abnormality. No stones or hydronephrosis. Urinary bladder is unremarkable. Stomach/Bowel: Descending colonic and sigmoid diverticulosis. No active diverticulitis. Stomach, large and small bowel decompressed, otherwise grossly unremarkable. 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 There appears to be and included aorto femoral bypass graft. Fem-fem crossover bypass graft is noted and is patent. Right axillofemoral bypass graft also noted, patent. No evidence of aortic aneurysm. No adenopathy. Reproductive: Prior hysterectomy.  No adnexal masses. Other: No free fluid or free air. Musculoskeletal: No acute bony abnormality or focal bone lesion. IMPRESSION: No evidence of significant traumatic injury in the chest, abdomen or pelvis. Old appearing right lateral seventh rib fracture and left posterior twelfth rib fracture. No definite acute fracture. Moderate bilateral pleural effusions. Cardiomegaly, coronary artery disease. Aortic atherosclerosis. 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 Patent right axillofemoral bypass and fem-fem crossover bypass graft. Left colonic diverticulosis. Electronically Signed   By: Charlett Nose M.D.   On: 04/29/2016 09:14   Dg Chest Port 1 View  Result Date: 05/03/2016 CLINICAL DATA:  Chest pain EXAM: PORTABLE CHEST 1 VIEW COMPARISON:  05/02/2016 FINDINGS: Cardiac shadow remains enlarged. Postsurgical changes are again seen. The lungs are well aerated bilaterally. Mild bronchitic changes are seen without focal confluent  infiltrate. No sizable effusion is noted. IMPRESSION: Mild interstitial changes without acute abnormality. Electronically Signed   By: Alcide Clever M.D.   On: 05/03/2016 10:04   Dg Abd 2 Views  Result Date: 05/04/2016 CLINICAL DATA:  Abdominal pain . EXAM: ABDOMEN - 2 VIEW COMPARISON:  CT 04/19/2016. FINDINGS: Prior CABG. Surgical clips noted throughout the abdomen and pelvis. Soft tissues the abdomen are unremarkable. No bowel distention. Stool noted throughout the colon. Atherosclerotic vascular calcification at aortoiliac atherosclerotic vascular calcification. Degenerative changes lumbar spine and both hips. IMPRESSION: 1.  No acute abnormality identified. 2.  Prior CABG. 3.  Aortoiliac atherosclerotic vascular disease. Electronically Signed   By: Maisie Fus  Register   On: 05/04/2016 14:23   Dg Hip Unilat W Or Wo Pelvis 2-3 Views Right  Result Date: 04/25/2016 CLINICAL DATA:  Fall with pain EXAM: DG HIP (WITH OR WITHOUT PELVIS) 2-3V RIGHT COMPARISON:  None. FINDINGS: Surgical clips over the bilateral groins. No acute  fracture or dislocation. Pubic symphysis appears intact. SI joints are symmetric. Vascular calcifications in the pelvis and right groin. IMPRESSION: No radiographic evidence for acute osseous abnormality Electronically Signed   By: Jasmine Pang M.D.   On: 04/25/2016 22:32   Dg Femur, Min 2 Views Right  Result Date: 04/29/2016 CLINICAL DATA:  Larey Seat.  Right hip pain. EXAM: RIGHT FEMUR 2 VIEWS COMPARISON:  Same day FINDINGS: Previous total knee arthroplasty. No trauma seen in that region. No more proximal femoral fracture. Right hemipelvis as seen appears negative. IMPRESSION: Negative Electronically Signed   By: Paulina Fusi M.D.   On: 04/29/2016 06:27    Lab Data:  CBC:  Recent Labs Lab 05/02/16 1643 05/02/16 1644 05/04/16 0003 05/05/16 0413  WBC 10.8*  --  10.1 8.6  NEUTROABS 8.2*  --   --   --   HGB 13.4  --  11.0* 10.4*  HCT 42.4  --  35.1* 32.3*  MCV 86.0  --  85.6  83.7  PLT 261 198 193 202   Basic Metabolic Panel:  Recent Labs Lab 05/02/16 1643 05/04/16 0003 05/05/16 0413  NA 135 133* 136  K 4.4 3.4* 3.2*  CL 100* 98* 100*  CO2 26 27 29   GLUCOSE 107* 102* 97  BUN 16 11 8   CREATININE 1.01* 0.98 0.88  CALCIUM 9.2 8.4* 8.3*   GFR: Estimated Creatinine Clearance: 34.6 mL/min (by C-G formula based on SCr of 0.88 mg/dL). Liver Function Tests:  Recent Labs Lab 05/02/16 1643 05/04/16 0003  AST 32 26  ALT 16 12*  ALKPHOS 136* 103  BILITOT 1.7* 1.3*  PROT 7.3 5.4*  ALBUMIN 3.8 2.9*   No results for input(s): LIPASE, AMYLASE in the last 168 hours. No results for input(s): AMMONIA in the last 168 hours. Coagulation Profile:  Recent Labs Lab 05/02/16 1644  INR 1.91   Cardiac Enzymes:  Recent Labs Lab 05/03/16 1532 05/03/16 1840 05/04/16 0003  TROPONINI 0.03* 0.03* 0.03*   BNP (last 3 results) No results for input(s): PROBNP in the last 8760 hours. HbA1C: No results for input(s): HGBA1C in the last 72 hours. CBG:  Recent Labs Lab 05/04/16 0550 05/05/16 0650 05/06/16 0609  GLUCAP 87 100* 122*   Lipid Profile: No results for input(s): CHOL, HDL, LDLCALC, TRIG, CHOLHDL, LDLDIRECT in the last 72 hours. Thyroid Function Tests: No results for input(s): TSH, T4TOTAL, FREET4, T3FREE, THYROIDAB in the last 72 hours. Anemia Panel: No results for input(s): VITAMINB12, FOLATE, FERRITIN, TIBC, IRON, RETICCTPCT in the last 72 hours. Urine analysis:    Component Value Date/Time   COLORURINE YELLOW 05/04/2016 0343   APPEARANCEUR HAZY (A) 05/04/2016 0343   LABSPEC 1.006 05/04/2016 0343   PHURINE 7.0 05/04/2016 0343   GLUCOSEU NEGATIVE 05/04/2016 0343   HGBUR NEGATIVE 05/04/2016 0343   BILIRUBINUR NEGATIVE 05/04/2016 0343   KETONESUR NEGATIVE 05/04/2016 0343   PROTEINUR NEGATIVE 05/04/2016 0343   UROBILINOGEN 0.2 02/25/2014 1842   NITRITE NEGATIVE 05/04/2016 0343   LEUKOCYTESUR SMALL (A) 05/04/2016 0343     [name] Short  M.D. [facility] 05/06/2016, 10:10 AM  Pager: [phone] Between 7am to 7pm - call Pager - [phone]  After 7pm go to [email or web address] - password [email or web address]  Call night coverage person covering after 7pm

## 2016-06-07 ENCOUNTER — Encounter: Payer: Self-pay | Admitting: Neurology

## 2016-10-24 ENCOUNTER — Ambulatory Visit: Payer: Medicare Other | Admitting: Neurology

## 2016-12-04 ENCOUNTER — Ambulatory Visit: Payer: Medicare Other | Admitting: Neurology

## 2016-12-12 DEATH — deceased

## 2018-09-06 IMAGING — CT CT T SPINE W/O CM
3 of 13 series · 6 of 33 positions shown, 7 images · non-contrast
Comparison: 10/03/2015, chest x-ray 03/20/2016

CLINICAL DATA: Unwitnessed fall

EXAM:
CT THORACIC SPINE WITHOUT CONTRAST
TECHNIQUE: Multidetector CT imaging of the thoracic spine was performed without
intravenous contrast administration. Multiplanar CT image
reconstructions were also generated.

[Series 7: c-spine st · axial · 0.24mm/px · z∈[+1417,+1475]mm · 2 of 88 slices shown]
[im 30/88  bone]
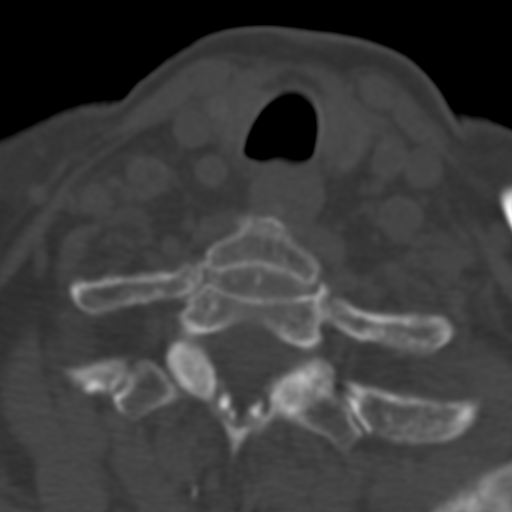
[im 59/88  bone]
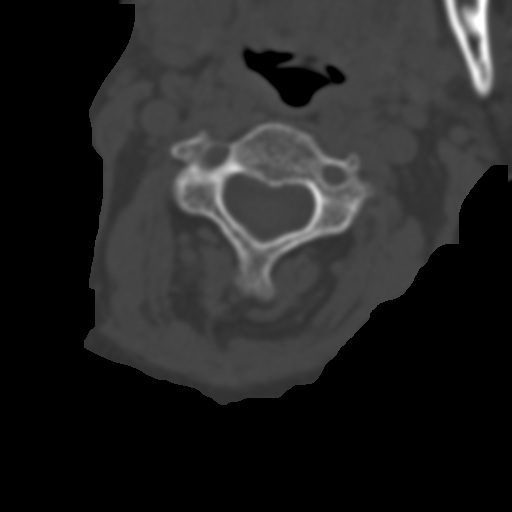

[Series 12: t-spine st · axial · 0.32mm/px · z∈[+1294,+1366]mm · 2 of 108 slices shown, 3 images]
[im 36/108  soft-tissue]
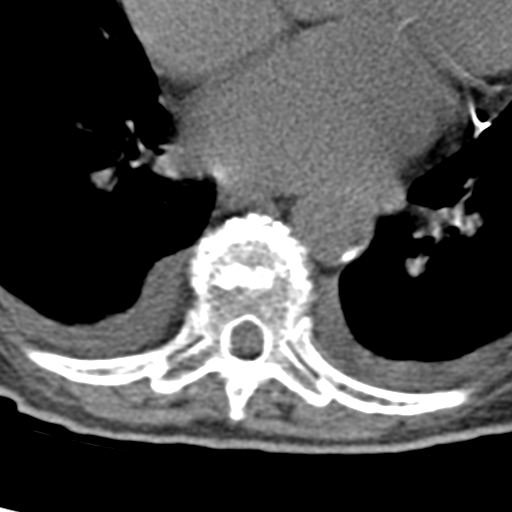
[im 36/108  bone]
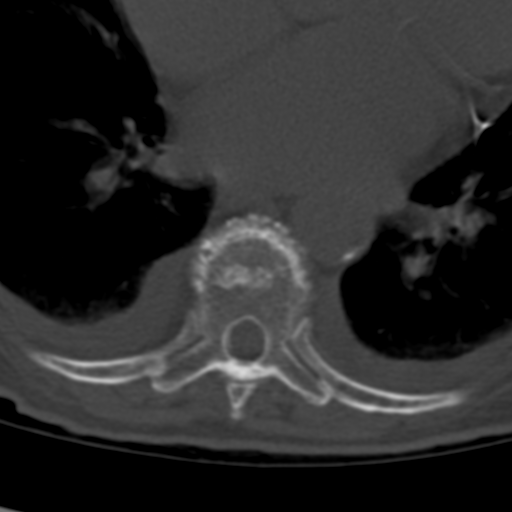
[im 72/108  bone]
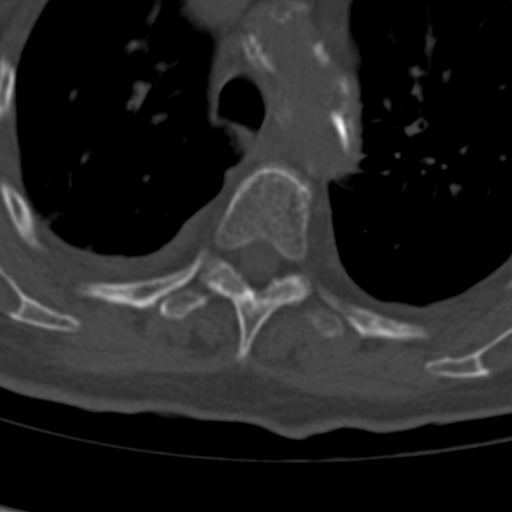

[Series 19: sagittal · sagittal · 0.27mm/px · 2 of 50 slices shown]
[im 17/50  bone]
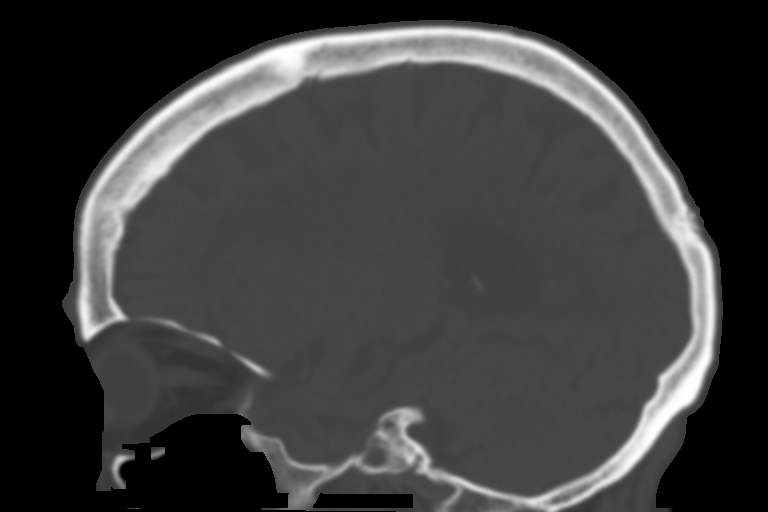
[im 33/50  bone]
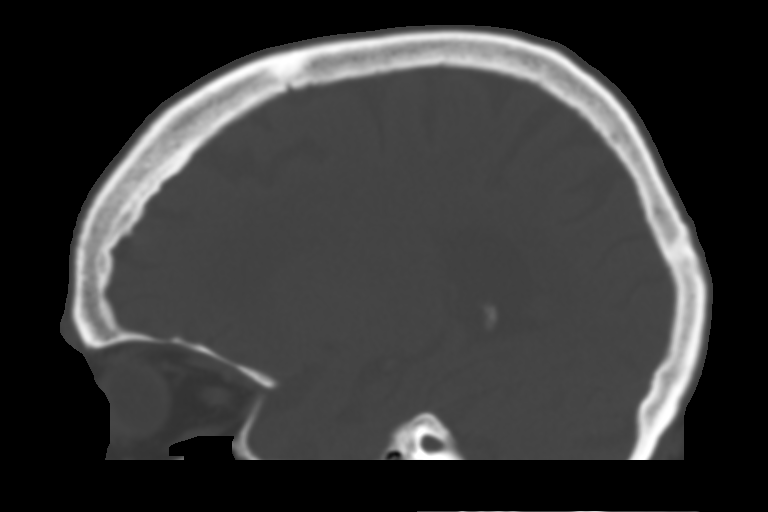

[6 of 33 positions shown; findings below may reference images not displayed]

FINDINGS: Alignment: Thoracic spine alignment is within normal limits. The
examination is limited by motion artifact.

Vertebrae: Vertebral body heights are grossly maintained. There are
no retropulsed fracture fragments into the spinal canal. There are
slightly displaced transverse process fractures on the left side at
L1 and L2.

Paraspinal and other soft tissues: There is dense atherosclerotic
calcification. There are small bilateral pleural effusions. No
paraspinal or paravertebral soft tissue abnormality is visualized.

Disc levels: Multilevel degenerative disc changes with endplate
changes, multiple vacuum disc, and osteophytes.
IMPRESSION: 1. Mildly displaced left transverse process fractures at L1 and L2.
2. Thoracic spine alignment grossly within normal limits. No
compression fracture deformities allowing for patient motion.
